# Patient Record
Sex: Female | Born: 1950 | Hispanic: No | Marital: Married | Smoking: Never smoker
Health system: Southern US, Community
[De-identification: ages and names within clinical notes are randomized; demographics above are authoritative.]

## PROBLEM LIST (undated history)

## (undated) DIAGNOSIS — N950 Postmenopausal bleeding: Secondary | ICD-10-CM

## (undated) DIAGNOSIS — H409 Unspecified glaucoma: Secondary | ICD-10-CM

## (undated) DIAGNOSIS — R252 Cramp and spasm: Secondary | ICD-10-CM

## (undated) DIAGNOSIS — E119 Type 2 diabetes mellitus without complications: Secondary | ICD-10-CM

## (undated) DIAGNOSIS — I10 Essential (primary) hypertension: Secondary | ICD-10-CM

## (undated) DIAGNOSIS — R002 Palpitations: Secondary | ICD-10-CM

## (undated) DIAGNOSIS — C55 Malignant neoplasm of uterus, part unspecified: Secondary | ICD-10-CM

## (undated) HISTORY — DX: Type 2 diabetes mellitus without complications: E11.9

## (undated) HISTORY — DX: Malignant neoplasm of uterus, part unspecified: C55

## (undated) HISTORY — DX: Essential (primary) hypertension: I10

## (undated) HISTORY — DX: Unspecified glaucoma: H40.9

---

## 2015-01-09 ENCOUNTER — Ambulatory Visit: Payer: 59 | Attending: Gynecologic Oncology | Admitting: Gynecologic Oncology

## 2015-01-09 ENCOUNTER — Ambulatory Visit: Payer: 59

## 2015-01-09 ENCOUNTER — Encounter: Payer: Self-pay | Admitting: Gynecologic Oncology

## 2015-01-09 VITALS — BP 167/73 | HR 70 | Temp 98.2°F | Resp 18 | Ht 61.0 in | Wt 125.4 lb

## 2015-01-09 DIAGNOSIS — E7800 Pure hypercholesterolemia, unspecified: Secondary | ICD-10-CM | POA: Insufficient documentation

## 2015-01-09 DIAGNOSIS — I10 Essential (primary) hypertension: Secondary | ICD-10-CM | POA: Diagnosis not present

## 2015-01-09 DIAGNOSIS — E119 Type 2 diabetes mellitus without complications: Secondary | ICD-10-CM | POA: Diagnosis not present

## 2015-01-09 DIAGNOSIS — C541 Malignant neoplasm of endometrium: Secondary | ICD-10-CM | POA: Diagnosis not present

## 2015-01-09 NOTE — Patient Instructions (Addendum)
Preparing for your Surgery  We will contact you with the results of your CT scan.  Plan for surgery on February 18 with Dr. [name].  Pre-operative Testing -You will receive a phone call from presurgical testing at [facility] to arrange for a pre-operative testing appointment before your surgery.  This appointment normally occurs one to two weeks before your scheduled surgery.   -Bring your insurance card, copy of an advanced directive if applicable, medication list  -At that visit, you will be asked to sign a consent for a possible blood transfusion in case a transfusion becomes necessary during surgery.  The need for a blood transfusion is rare but having consent is a necessary part of your care.     -You should not be taking blood thinners or aspirin at least ten days prior to surgery unless instructed by your surgeon.  Day Before Surgery at [facility] will be asked to take in only clear liquids the day before surgery.  Examples of clear liquids include broths, jello, and clear juices.  You will be advised to have nothing to eat or drink after midnight the evening before.    Your role in recovery Your role is to become active as soon as directed by your doctor, while still giving yourself time to heal.  Rest when you feel tired. You will be asked to do the following in order to speed your recovery:  - Cough and breathe deeply. This helps toclear and expand your lungs and can prevent pneumonia. You may be given a spirometer to practice deep breathing. A staff member will show you how to use the spirometer. - Do mild physical activity. Walking or moving your legs help your circulation and body functions return to normal. A staff member will help you when you try to walk and will provide you with simple exercises. Do not try to get up or walk alone the first time. - Actively manage your pain. Managing your pain lets you move in comfort. We will ask you to rate your pain  on a scale of zero to 10. It is your responsibility to tell your doctor or nurse where and how much you hurt so your pain can be treated.  Special Considerations -If you are diabetic, you may be placed on insulin after surgery to have closer control over your blood sugars to promote healing and recovery.  This does not mean that you will be discharged on insulin.  If applicable, your oral antidiabetics will be resumed when you are tolerating a solid diet.  -Your final pathology results from surgery should be available by the Friday after surgery and the results will be relayed to you when available.  Blood Transfusion Information WHAT IS A BLOOD TRANSFUSION? A transfusion is the replacement of blood or some of its parts. Blood is made up of multiple cells which provide different functions.  Red blood cells carry oxygen and are used for blood loss replacement.  White blood cells fight against infection.  Platelets control bleeding.  Plasma helps clot blood.  Other blood products are available for specialized needs, such as hemophilia or other clotting disorders. BEFORE THE TRANSFUSION  Who gives blood for transfusions?   You may be able to donate blood to be used at a later date on yourself (autologous donation).  Relatives can be asked to donate blood. This is generally not any safer than if you have received blood from a stranger. The same precautions are taken to ensure safety  when a relative's blood is donated.  Healthy volunteers who are fully evaluated to make sure their blood is safe. This is blood bank blood. Transfusion therapy is the safest it has ever been in the practice of medicine. Before blood is taken from a donor, a complete history is taken to make sure that person has no history of diseases nor engages in risky social behavior (examples are intravenous drug use or sexual activity with multiple partners). The donor's travel history is screened to minimize risk of  transmitting infections, such as malaria. The donated blood is tested for signs of infectious diseases, such as HIV and hepatitis. The blood is then tested to be sure it is compatible with you in order to minimize the chance of a transfusion reaction. If you or a relative donates blood, this is often done in anticipation of surgery and is not appropriate for emergency situations. It takes many days to process the donated blood. RISKS AND COMPLICATIONS Although transfusion therapy is very safe and saves many lives, the main dangers of transfusion include:   Getting an infectious disease.  Developing a transfusion reaction. This is an allergic reaction to something in the blood you were given. Every precaution is taken to prevent this. The decision to have a blood transfusion has been considered carefully by your caregiver before blood is given. Blood is not given unless the benefits outweigh the risks.

## 2015-01-09 NOTE — Progress Notes (Signed)
Consult Note: Gyn-Onc  Consult was requested by Dr. [name] for the evaluation of Brittany Schwartz 64 y.o. female  CC:  Chief Complaint  Patient presents with  . Endometrial cancer    Assessment/Plan:  [name]  is a 64 y.o.  year old with grade 3 endometrial cancer.   A detailed discussion was held with the patient and her family with regard to to her endometrial cancer diagnosis. We discussed the standard management options for uterine cancer which includes surgery followed possibly by adjuvant therapy depending on the results of surgery. The options for surgical management include a hysterectomy and removal of the tubes and ovaries possibly with removal of pelvic and para-aortic lymph nodes. A minimally invasive approach including a robotic hysterectomy or laparoscopic hysterectomy have benefits including shorter hospital stay, recovery time and better wound healing. The alternative approach is an open hysterectomy. The patient has been counseled about these surgical options and the risks of surgery in general including infection, bleeding, damage to surrounding structures (including bowel, bladder, ureters, nerves or vessels), and the postoperative risks of PE/ DVT, and lymphedema. I extensively reviewed the additional risks of robotic hysterectomy including possible need for conversion to open laparotomy.  I discussed positioning during surgery of trendelenberg and risks of minor facial swelling and care we take in preoperative positioning.  After counseling and consideration of her options, she desires to proceed with robotic total hysterectomy, BSO, pelvic and para-aortic lymphadenectomy on February 18th, 2016.   We will schedule a reoperative CT scan of the chest abdomen and pelvis given her grade 3 pathology and the increased risk for metastatic disease which might alter surgical plan.  She will be seen by anesthesia for preoperative clearance and discussion of postoperative pain  management.  She was given the opportunity to ask questions, which were answered to her satisfaction, and she is agreement with the above mentioned plan of care.   HPI: Brittany Schwartz is a 64 year old G2 P2 who is seen in consultation at the request of Dr. [name] for grade 3 endometrial adenocarcinoma. The patient reports a 1 year history of vaginal spotting. She saw Dr. [name] on 01/03/2015 and after reporting this history a pelvic ultrasound was performed. On ultrasound the uterus measured 8 x 5 x 4 cm with a 33 mm endometrial stripe and increased vascularity. The ovaries appeared normal and no fluid was noted in the cul-de-sac. An endometrial biopsy was performed on every second 2016 and this revealed grade 3 endometrial adenocarcinoma.  She is no family history for cancers. Her major risk factors for endometrial cancer include history of diabetes mellitus and hypertension. She's been worked up for a fast heart beat approximate 12 months ago and was placed on a beta blocker but workup was otherwise unremarkable. She has no prior surgical history.  Interval History: Her bleeding increased slightly after the endometrial biopsy however remains light.  Current Meds:  Outpatient Encounter Prescriptions as of 01/09/2015  Medication Sig  . atenolol-chlorthalidone (TENORETIC) 50-25 MG per tablet Take 1 tablet by mouth daily.  . cholecalciferol (VITAMIN D) 1000 UNITS tablet Take 1,000 Units by mouth daily.  Marland Kitchen glipiZIDE (GLUCOTROL XL) 5 MG 24 hr tablet Take 5 mg by mouth 2 (two) times daily.  Marland Kitchen latanoprost (XALATAN) 0.005 % ophthalmic solution Place 1 drop into both eyes at bedtime.  Marland Kitchen lisinopril (PRINIVIL,ZESTRIL) 5 MG tablet Take 5 mg by mouth daily.  . metFORMIN (GLUCOPHAGE) 1000 MG tablet Take 1,000 mg by mouth 2 (two) times daily  with a meal.  . OVER THE COUNTER MEDICATION 1 tablet.  . pravastatin (PRAVACHOL) 20 MG tablet Take 20 mg by mouth daily.    Allergy: Not on File  Social Hx:   History   Social  History  . Marital Status: Married    Spouse Name: N/A    Number of Children: N/A  . Years of Education: N/A   Occupational History  . Not on file.   Social History Main Topics  . Smoking status: Never Smoker   . Smokeless tobacco: Not on file  . Alcohol Use: No  . Drug Use: No  . Sexual Activity: Yes   Other Topics Concern  . Not on file   Social History Narrative  . No narrative on file    Past Surgical Hx: History reviewed. No pertinent past surgical history.  Past Medical Hx:  Past Medical History  Diagnosis Date  . Diabetes mellitus without complication   . Hypertension   . Uterine cancer   . Glaucoma     Past Gynecological History:  SVD x 2, postmenopausal  No LMP recorded.  Family Hx: History reviewed. No pertinent family history.  Review of Systems:  Constitutional  Feels well,    ENT Normal appearing ears and nares bilaterally Skin/Breast  No rash, sores, jaundice, itching, dryness Cardiovascular  No chest pain, shortness of breath, or edema  Pulmonary  No cough or wheeze.  Gastro Intestinal  No nausea, vomitting, or diarrhoea. No bright red blood per rectum, no abdominal pain, change in bowel movement, or constipation.  Genito Urinary  No frequency, urgency, dysuria, + postmenopausal bleeding Musculo Skeletal  No myalgia, arthralgia, joint swelling or pain  Neurologic  No weakness, numbness, change in gait,  Psychology  No depression, anxiety, insomnia.   Vitals:  Blood pressure 167/73, pulse 70, temperature 98.2 F (36.8 C), temperature source Oral, resp. rate 18.  Physical Exam: WD in NAD Neck  Supple NROM, without any enlargements.  Lymph Node Survey No cervical supraclavicular or inguinal adenopathy Cardiovascular  Pulse normal rate, regularity and rhythm. S1 and S2 normal.  Lungs  Clear to auscultation bilateraly, without wheezes/crackles/rhonchi. Good air movement.  Skin  No rash/lesions/breakdown  Psychiatry  Alert and  oriented to person, place, and time  Abdomen  Normoactive bowel sounds, abdomen soft, non-tender and thin without evidence of hernia. No palpable masses Back No CVA tenderness Genito Urinary  Vulva/vagina: Normal external female genitalia.   No lesions. No discharge or bleeding.  There is an excoriation on the left labia minora which does not appear consistent with dysplasia or malignancy.  Bladder/urethra:  No lesions or masses, well supported bladder  Vagina: normal in appearance, no metastases apparent  Cervix: Normal appearing, no lesions.  Uterus: Small, mobile, no parametrial involvement or nodularity.  Adnexa: no palpable masses. Rectal  Good tone, no masses no cul de sac nodularity.  Extremities  No bilateral cyanosis, clubbing or edema.   Donaciano Eva, MD   01/09/2015, 10:20 AM

## 2015-01-11 ENCOUNTER — Other Ambulatory Visit (HOSPITAL_COMMUNITY): Payer: Self-pay | Admitting: *Deleted

## 2015-01-13 ENCOUNTER — Telehealth: Payer: Self-pay | Admitting: Gynecologic Oncology

## 2015-01-13 ENCOUNTER — Ambulatory Visit (HOSPITAL_COMMUNITY)
Admission: RE | Admit: 2015-01-13 | Discharge: 2015-01-13 | Disposition: A | Discharge status: Home / Self Care | Payer: 59 | Source: Ambulatory Visit | Attending: Gynecologic Oncology | Admitting: Gynecologic Oncology

## 2015-01-13 ENCOUNTER — Encounter (HOSPITAL_COMMUNITY): Payer: Self-pay

## 2015-01-13 ENCOUNTER — Encounter (HOSPITAL_COMMUNITY)
Admission: RE | Admit: 2015-01-13 | Discharge: 2015-01-13 | Disposition: A | Discharge status: Home / Self Care | Payer: 59 | Source: Ambulatory Visit | Attending: Gynecologic Oncology | Admitting: Gynecologic Oncology

## 2015-01-13 DIAGNOSIS — C541 Malignant neoplasm of endometrium: Secondary | ICD-10-CM | POA: Diagnosis not present

## 2015-01-13 DIAGNOSIS — N939 Abnormal uterine and vaginal bleeding, unspecified: Secondary | ICD-10-CM | POA: Insufficient documentation

## 2015-01-13 HISTORY — DX: Palpitations: R00.2

## 2015-01-13 HISTORY — DX: Cramp and spasm: R25.2

## 2015-01-13 HISTORY — DX: Postmenopausal bleeding: N95.0

## 2015-01-13 LAB — URINALYSIS, ROUTINE W REFLEX MICROSCOPIC
BILIRUBIN URINE: NEGATIVE
Glucose, UA: NEGATIVE mg/dL
Ketones, ur: NEGATIVE mg/dL
Leukocytes, UA: NEGATIVE
Nitrite: NEGATIVE
PROTEIN: NEGATIVE mg/dL
Specific Gravity, Urine: >1.046 — ABNORMAL HIGH (ref 1.005–1.030)
UROBILINOGEN UA: 0.2 mg/dL (ref 0.0–1.0)
pH: 6 (ref 5.0–8.0)

## 2015-01-13 LAB — URINE MICROSCOPIC-ADD ON

## 2015-01-13 MED ORDER — IOHEXOL 300 MG/ML  SOLN
100.0000 mL | Freq: Once | INTRAMUSCULAR | Status: AC | PRN
Start: 2015-01-13 — End: 2015-01-13
  Administered 2015-01-13: 100 mL via INTRAVENOUS

## 2015-01-13 NOTE — Telephone Encounter (Signed)
Patient's daughter informed of CT scan results.  Advised to call for any questions or concerns.

## 2015-01-13 NOTE — Patient Instructions (Addendum)
Brittany Schwartz  01/13/2015   Your procedure is scheduled on: 01/19/15   Report to [facility] Main  Entrance and follow signs to               [place] at 10:30 AM.   Call this number if you have problems the morning of surgery [phone]   Remember:  Do not eat food or drink liquids :After Midnight.     Take these medicines the morning of surgery with A SIP OF WATER: NONE              CLEAR LIQUIDS ONLY THE DAY BEFORE SURGERY     CLEAR LIQUID DIET   Foods Allowed                                                                     Foods Excluded  Coffee and tea, regular and decaf                             liquids that you cannot  Plain Jell-O in any flavor                                             see through such as: Fruit ices (not with fruit pulp)                                     milk, soups, orange juice  Iced Popsicles                                                 All solid food Carbonated beverages, regular and diet                                    Cranberry, grape and apple juices Sports drinks like Gatorade Lightly seasoned clear broth or consume(fat free) Sugar, honey syrup  _____________________________________________________________________                                 Brittany Schwartz may not have any metal on your body including hair pins and              piercings  Do not wear jewelry, make-up, lotions, powders or perfumes.             Do not wear nail polish.  Do not shave  48 hours prior to surgery.              Men may shave face and neck.   Do not bring valuables to the hospital. Brittany Schwartz  FOR VALUABLES.  Contacts, dentures or bridgework may not be worn into surgery.  Leave suitcase in the car. After surgery it may be brought to your room.     Patients discharged the day of surgery will not be allowed to drive home.  Name and phone number of your driver:  Special Instructions:  N/A              Please read over the following fact sheets you were given: _____________________________________________________________________                                                     [place]  Before surgery, you can play an important role.  Because skin is not sterile, your skin needs to be as free of germs as possible.  You can reduce the number of germs on your skin by washing with CHG (chlorahexidine gluconate) soap before surgery.  CHG is an antiseptic cleaner which kills germs and bonds with the skin to continue killing germs even after washing. Please DO NOT use if you have an allergy to CHG or antibacterial soaps.  If your skin becomes reddened/irritated stop using the CHG and inform your nurse when you arrive at Short Stay. Do not shave (including legs and underarms) for at least 48 hours prior to the first CHG shower.  You may shave your face. Please follow these instructions carefully:   1.  Shower with CHG Soap the night before surgery and the  morning of Surgery.   2.  If you choose to wash your hair, wash your hair first as usual with your  normal  Shampoo.   3.  After you shampoo, rinse your hair and body thoroughly to remove the  shampoo.                                         4.  Use CHG as you would any other liquid soap.  You can apply chg directly  to the skin and wash . Gently wash with scrungie or clean wascloth    5.  Apply the CHG Soap to your body ONLY FROM THE NECK DOWN.   Do not use on open                           Wound or open sores. Avoid contact with eyes, ears mouth and genitals (private parts).                        Genitals (private parts) with your normal soap.              6.  Wash thoroughly, paying special attention to the area where your surgery  will be performed.   7.  Thoroughly rinse your body with warm water from the neck down.   8.  DO NOT shower/wash with your normal soap after using and rinsing  off  the CHG Soap .                9.  Pat yourself dry with a clean towel.             10.  Wear  clean pajamas.             11.  Place clean sheets on your bed the night of your first shower and do not  sleep with pets.  Day of Surgery : Do not apply any lotions/deodorants the morning of surgery.  Please wear clean clothes to the hospital/surgery center.  FAILURE TO FOLLOW THESE INSTRUCTIONS MAY RESULT IN THE CANCELLATION OF YOUR SURGERY    PATIENT SIGNATURE_________________________________  ______________________________________________________________________     Brittany Schwartz  An incentive spirometer is a tool that can help keep your lungs clear and active. This tool measures how well you are filling your lungs with each breath. Taking long deep breaths may help reverse or decrease the chance of developing breathing (pulmonary) problems (especially infection) following:  A long period of time when you are unable to move or be active. BEFORE THE PROCEDURE   If the spirometer includes an indicator to show your best effort, your nurse or respiratory therapist will set it to a desired goal.  If possible, sit up straight or lean slightly forward. Try not to slouch.  Hold the incentive spirometer in an upright position. INSTRUCTIONS FOR USE   Sit on the edge of your bed if possible, or sit up as far as you can in bed or on a chair.  Hold the incentive spirometer in an upright position.  Breathe out normally.  Place the mouthpiece in your mouth and seal your lips tightly around it.  Breathe in slowly and as deeply as possible, raising the piston or the ball toward the top of the column.  Hold your breath for 3-5 seconds or for as long as possible. Allow the piston or ball to fall to the bottom of the column.  Remove the mouthpiece from your mouth and breathe out normally.  Rest for a few seconds and repeat Steps 1 through 7 at least 10 times every 1-2 hours when  you are awake. Take your time and take a few normal breaths between deep breaths.  The spirometer may include an indicator to show your best effort. Use the indicator as a goal to work toward during each repetition.  After each set of 10 deep breaths, practice coughing to be sure your lungs are clear. If you have an incision (the cut made at the time of surgery), support your incision when coughing by placing a pillow or rolled up towels firmly against it. Once you are able to get out of bed, walk around indoors and cough well. You may stop using the incentive spirometer when instructed by your caregiver.  RISKS AND COMPLICATIONS  Take your time so you do not get dizzy or light-headed.  If you are in pain, you may need to take or ask for pain medication before doing incentive spirometry. It is harder to take a deep breath if you are having pain. AFTER USE  Rest and breathe slowly and easily.  It can be helpful to keep track of a log of your progress. Your caregiver can provide you with a simple table to help with this. If you are using the spirometer at home, follow these instructions: [place] IF:   You are having difficultly using the spirometer.  You have trouble using the spirometer as often as instructed.  Your pain medication is not giving enough relief while using the spirometer.  You develop fever of 100.5 F (38.1 C) or higher. SEEK IMMEDIATE MEDICAL CARE IF:   You cough up bloody  sputum that had not been present before.  You develop fever of 102 F (38.9 C) or greater.  You develop worsening pain at or near the incision site. MAKE SURE YOU:   Understand these instructions.  Will watch your condition.  Will get help right away if you are not doing well or get worse. Document Released: 03/31/2007 Document Revised: 02/10/2012 Document Reviewed: 06/01/2007 Exit[employer] Patient Information 2014 Exit[employer],  [place].   ________________________________________________________________________  WHAT IS A BLOOD TRANSFUSION? Blood Transfusion Information  A transfusion is the replacement of blood or some of its parts. Blood is made up of multiple cells which provide different functions.  Red blood cells carry oxygen and are used for blood loss replacement.  White blood cells fight against infection.  Platelets control bleeding.  Plasma helps clot blood.  Other blood products are available for specialized needs, such as hemophilia or other clotting disorders. BEFORE THE TRANSFUSION  Who gives blood for transfusions?   Healthy volunteers who are fully evaluated to make sure their blood is safe. This is blood bank blood. Transfusion therapy is the safest it has ever been in the practice of medicine. Before blood is taken from a donor, a complete history is taken to make sure that person has no history of diseases nor engages in risky social behavior (examples are intravenous drug use or sexual activity with multiple partners). The donor's travel history is screened to minimize risk of transmitting infections, such as malaria. The donated blood is tested for signs of infectious diseases, such as HIV and hepatitis. The blood is then tested to be sure it is compatible with you in order to minimize the chance of a transfusion reaction. If you or a relative donates blood, this is often done in anticipation of surgery and is not appropriate for emergency situations. It takes many days to process the donated blood. RISKS AND COMPLICATIONS Although transfusion therapy is very safe and saves many lives, the main dangers of transfusion include:   Getting an infectious disease.  Developing a transfusion reaction. This is an allergic reaction to something in the blood you were given. Every precaution is taken to prevent this. The decision to have a blood transfusion has been considered carefully by your caregiver  before blood is given. Blood is not given unless the benefits outweigh the risks. AFTER THE TRANSFUSION  Right after receiving a blood transfusion, you will usually feel much better and more energetic. This is especially true if your red blood cells have gotten low (anemic). The transfusion raises the level of the red blood cells which carry oxygen, and this usually causes an energy increase.  The nurse administering the transfusion will monitor you carefully for complications. HOME CARE INSTRUCTIONS  No special instructions are needed after a transfusion. You may find your energy is better. Speak with your caregiver about any limitations on activity for underlying diseases you may have. SEEK MEDICAL CARE IF:   Your condition is not improving after your transfusion.  You develop redness or irritation at the intravenous (IV) site. SEEK IMMEDIATE MEDICAL CARE IF:  Any of the following symptoms occur over the next 12 hours:  Shaking chills.  You have a temperature by mouth above 102 F (38.9 C), not controlled by medicine.  Chest, back, or muscle pain.  People around you feel you are not acting correctly or are confused.  Shortness of breath or difficulty breathing.  Dizziness and fainting.  You get a rash or develop hives.  You have a  decrease in urine output.  Your urine turns a dark color or changes to pink, red, or brown. Any of the following symptoms occur over the next 10 days:  You have a temperature by mouth above 102 F (38.9 C), not controlled by medicine.  Shortness of breath.  Weakness after normal activity.  The white part of the eye turns yellow (jaundice).  You have a decrease in the amount of urine or are urinating less often.  Your urine turns a dark color or changes to pink, red, or brown. Document Released: 11/15/2000 Document Revised: 02/10/2012 Document Reviewed: 07/04/2008 [facility] Patient Information 2014 [place], [place],  [place].  _______________________________________________________________________

## 2015-01-13 NOTE — Telephone Encounter (Signed)
-----   Message from Everitt Amber, MD sent at 01/13/2015 11:07 AM EST ----- Would you mind letting Ms Davern know that her CT scan did not show any large metastatic disease and our plan for surgery is unchanged. Brittany Schwartz

## 2015-01-14 LAB — CA 125: CA 125: 22.9 U/mL (ref 0.0–34.0)

## 2015-01-16 NOTE — Progress Notes (Signed)
Ca125 faxed via EPIC to Dr [name].

## 2015-01-19 ENCOUNTER — Ambulatory Visit (HOSPITAL_COMMUNITY)
Admission: RE | Admit: 2015-01-19 | Discharge: 2015-01-20 | Disposition: A | Discharge status: Home / Self Care | Payer: 59 | Source: Ambulatory Visit | Attending: Obstetrics & Gynecology | Admitting: Obstetrics & Gynecology

## 2015-01-19 ENCOUNTER — Ambulatory Visit (HOSPITAL_COMMUNITY): Payer: 59 | Admitting: Certified Registered Nurse Anesthetist

## 2015-01-19 ENCOUNTER — Encounter (HOSPITAL_COMMUNITY): Payer: Self-pay | Admitting: *Deleted

## 2015-01-19 ENCOUNTER — Encounter (HOSPITAL_COMMUNITY)
Admission: RE | Disposition: A | Discharge status: Home / Self Care | Payer: Self-pay | Source: Ambulatory Visit | Attending: Obstetrics & Gynecology

## 2015-01-19 DIAGNOSIS — C785 Secondary malignant neoplasm of large intestine and rectum: Secondary | ICD-10-CM | POA: Insufficient documentation

## 2015-01-19 DIAGNOSIS — Z79899 Other long term (current) drug therapy: Secondary | ICD-10-CM | POA: Insufficient documentation

## 2015-01-19 DIAGNOSIS — C541 Malignant neoplasm of endometrium: Secondary | ICD-10-CM | POA: Diagnosis present

## 2015-01-19 DIAGNOSIS — I10 Essential (primary) hypertension: Secondary | ICD-10-CM | POA: Insufficient documentation

## 2015-01-19 DIAGNOSIS — E119 Type 2 diabetes mellitus without complications: Secondary | ICD-10-CM | POA: Diagnosis not present

## 2015-01-19 HISTORY — PX: ROBOTIC ASSISTED TOTAL HYSTERECTOMY WITH BILATERAL SALPINGO OOPHERECTOMY: SHX6086

## 2015-01-19 LAB — TYPE AND SCREEN
ABO/RH(D): B POS
Antibody Screen: NEGATIVE

## 2015-01-19 LAB — GLUCOSE, CAPILLARY
GLUCOSE-CAPILLARY: 130 mg/dL — AB (ref 70–99)
GLUCOSE-CAPILLARY: 203 mg/dL — AB (ref 70–99)
Glucose-Capillary: 154 mg/dL — ABNORMAL HIGH (ref 70–99)

## 2015-01-19 LAB — ABO/RH: ABO/RH(D): B POS

## 2015-01-19 SURGERY — HYSTERECTOMY, TOTAL, ROBOT-ASSISTED, LAPAROSCOPIC, WITH BILATERAL SALPINGO-OOPHORECTOMY
Anesthesia: General | Laterality: Bilateral

## 2015-01-19 MED ORDER — TIMOLOL HEMIHYDRATE 0.5 % OP SOLN: 1.0000 [drp] | Freq: Two times a day (BID) | OPHTHALMIC | Status: DC

## 2015-01-19 MED ORDER — CEFAZOLIN SODIUM-DEXTROSE 2-3 GM-% IV SOLR
INTRAVENOUS | Status: AC
  Filled 2015-01-19: qty 50

## 2015-01-19 MED ORDER — ONDANSETRON HCL 4 MG/2ML IJ SOLN
INTRAMUSCULAR | Status: AC
  Filled 2015-01-19: qty 2

## 2015-01-19 MED ORDER — IBUPROFEN 800 MG PO TABS
800.0000 mg | ORAL_TABLET | Freq: Three times a day (TID) | ORAL | Status: DC | PRN
  Administered 2015-01-20: 800 mg via ORAL
  Filled 2015-01-19: qty 1

## 2015-01-19 MED ORDER — LISINOPRIL 10 MG PO TABS
10.0000 mg | ORAL_TABLET | Freq: Every day | ORAL | Status: DC
  Administered 2015-01-19: 10 mg via ORAL
  Filled 2015-01-19 (×2): qty 1

## 2015-01-19 MED ORDER — SUCCINYLCHOLINE CHLORIDE 20 MG/ML IJ SOLN
INTRAMUSCULAR | Status: DC | PRN
  Administered 2015-01-19: 100 mg via INTRAVENOUS

## 2015-01-19 MED ORDER — PHENYLEPHRINE HCL 10 MG/ML IJ SOLN
INTRAMUSCULAR | Status: DC | PRN
  Administered 2015-01-19 (×3): 40 ug via INTRAVENOUS
  Administered 2015-01-19: 80 ug via INTRAVENOUS

## 2015-01-19 MED ORDER — HYDROMORPHONE HCL 1 MG/ML IJ SOLN
INTRAMUSCULAR | Status: AC
  Filled 2015-01-19: qty 1

## 2015-01-19 MED ORDER — ONDANSETRON HCL 4 MG PO TABS: 4.0000 mg | ORAL_TABLET | Freq: Four times a day (QID) | ORAL | Status: DC | PRN

## 2015-01-19 MED ORDER — LATANOPROST 0.005 % OP SOLN
1.0000 [drp] | Freq: Every day | OPHTHALMIC | Status: DC
  Administered 2015-01-19: 1 [drp] via OPHTHALMIC
  Filled 2015-01-19: qty 2.5

## 2015-01-19 MED ORDER — LIDOCAINE HCL (CARDIAC) 20 MG/ML IV SOLN
INTRAVENOUS | Status: AC
  Filled 2015-01-19: qty 5

## 2015-01-19 MED ORDER — ONDANSETRON HCL 4 MG/2ML IJ SOLN: 4.0000 mg | Freq: Four times a day (QID) | INTRAMUSCULAR | Status: DC | PRN

## 2015-01-19 MED ORDER — OXYCODONE-ACETAMINOPHEN 5-325 MG PO TABS
1.0000 | ORAL_TABLET | ORAL | Status: DC | PRN
  Administered 2015-01-20: 2 via ORAL
  Filled 2015-01-19: qty 2

## 2015-01-19 MED ORDER — HYDROMORPHONE HCL 1 MG/ML IJ SOLN
0.2000 mg | INTRAMUSCULAR | Status: AC | PRN
  Administered 2015-01-19 – 2015-01-20 (×2): 0.5 mg via INTRAVENOUS
  Filled 2015-01-19 (×2): qty 1

## 2015-01-19 MED ORDER — ATENOLOL-CHLORTHALIDONE 50-25 MG PO TABS: 1.0000 | ORAL_TABLET | Freq: Every morning | ORAL | Status: DC

## 2015-01-19 MED ORDER — ROCURONIUM BROMIDE 100 MG/10ML IV SOLN
INTRAVENOUS | Status: DC | PRN
  Administered 2015-01-19: 20 mg via INTRAVENOUS
  Administered 2015-01-19: 25 mg via INTRAVENOUS
  Administered 2015-01-19: 5 mg via INTRAVENOUS

## 2015-01-19 MED ORDER — ATENOLOL 50 MG PO TABS
50.0000 mg | ORAL_TABLET | Freq: Once | ORAL | Status: AC
  Administered 2015-01-19: 50 mg via ORAL
  Filled 2015-01-19: qty 1

## 2015-01-19 MED ORDER — ATENOLOL 50 MG PO TABS
50.0000 mg | ORAL_TABLET | Freq: Every day | ORAL | Status: DC
  Administered 2015-01-19 – 2015-01-20 (×2): 50 mg via ORAL
  Filled 2015-01-19 (×2): qty 1

## 2015-01-19 MED ORDER — DEXAMETHASONE SODIUM PHOSPHATE 10 MG/ML IJ SOLN
INTRAMUSCULAR | Status: AC
  Filled 2015-01-19: qty 1

## 2015-01-19 MED ORDER — LACTATED RINGERS IR SOLN
Status: DC | PRN
  Administered 2015-01-19: 700 mL

## 2015-01-19 MED ORDER — FENTANYL CITRATE 0.05 MG/ML IJ SOLN
INTRAMUSCULAR | Status: AC
  Filled 2015-01-19: qty 5

## 2015-01-19 MED ORDER — KCL IN DEXTROSE-NACL 20-5-0.45 MEQ/L-%-% IV SOLN
INTRAVENOUS | Status: DC
  Administered 2015-01-19: 22:00:00 via INTRAVENOUS
  Filled 2015-01-19 (×4): qty 1000

## 2015-01-19 MED ORDER — ENOXAPARIN SODIUM 40 MG/0.4ML ~~LOC~~ SOLN
40.0000 mg | SUBCUTANEOUS | Status: AC
  Administered 2015-01-19: 40 mg via SUBCUTANEOUS
  Filled 2015-01-19: qty 0.4

## 2015-01-19 MED ORDER — CHLORTHALIDONE 25 MG PO TABS
25.0000 mg | ORAL_TABLET | Freq: Every day | ORAL | Status: DC
Start: 2015-01-19 — End: 2015-01-20
  Administered 2015-01-19 – 2015-01-20 (×2): 25 mg via ORAL
  Filled 2015-01-19 (×2): qty 1

## 2015-01-19 MED ORDER — FENTANYL CITRATE 0.05 MG/ML IJ SOLN
INTRAMUSCULAR | Status: DC | PRN
  Administered 2015-01-19 (×3): 50 ug via INTRAVENOUS
  Administered 2015-01-19: 100 ug via INTRAVENOUS

## 2015-01-19 MED ORDER — LACTATED RINGERS IV SOLN
INTRAVENOUS | Status: DC
  Administered 2015-01-19: 1000 mL via INTRAVENOUS
  Administered 2015-01-19: 18:00:00 via INTRAVENOUS

## 2015-01-19 MED ORDER — LIDOCAINE HCL (CARDIAC) 20 MG/ML IV SOLN
INTRAVENOUS | Status: DC | PRN
  Administered 2015-01-19: 50 mg via INTRAVENOUS

## 2015-01-19 MED ORDER — DEXAMETHASONE SODIUM PHOSPHATE 10 MG/ML IJ SOLN
INTRAMUSCULAR | Status: DC | PRN
  Administered 2015-01-19: 10 mg via INTRAVENOUS

## 2015-01-19 MED ORDER — ONDANSETRON HCL 4 MG/2ML IJ SOLN
INTRAMUSCULAR | Status: DC | PRN
  Administered 2015-01-19: 4 mg via INTRAVENOUS

## 2015-01-19 MED ORDER — LACTATED RINGERS IV SOLN
INTRAVENOUS | Status: DC
  Administered 2015-01-19: 20:00:00 via INTRAVENOUS

## 2015-01-19 MED ORDER — MIDAZOLAM HCL 5 MG/5ML IJ SOLN
INTRAMUSCULAR | Status: DC | PRN
  Administered 2015-01-19 (×2): 1 mg via INTRAVENOUS

## 2015-01-19 MED ORDER — NEOSTIGMINE METHYLSULFATE 10 MG/10ML IV SOLN
INTRAVENOUS | Status: DC | PRN
  Administered 2015-01-19: 4 mg via INTRAVENOUS

## 2015-01-19 MED ORDER — HYDROMORPHONE HCL 1 MG/ML IJ SOLN
0.2500 mg | INTRAMUSCULAR | Status: DC | PRN
  Administered 2015-01-19 (×2): 0.5 mg via INTRAVENOUS

## 2015-01-19 MED ORDER — ENOXAPARIN SODIUM 40 MG/0.4ML ~~LOC~~ SOLN
40.0000 mg | SUBCUTANEOUS | Status: DC
  Administered 2015-01-20: 40 mg via SUBCUTANEOUS
  Filled 2015-01-19: qty 0.4

## 2015-01-19 MED ORDER — INSULIN ASPART 100 UNIT/ML ~~LOC~~ SOLN
0.0000 [IU] | Freq: Three times a day (TID) | SUBCUTANEOUS | Status: DC
  Administered 2015-01-20: 3 [IU] via SUBCUTANEOUS
  Administered 2015-01-20: 5 [IU] via SUBCUTANEOUS

## 2015-01-19 MED ORDER — GLYCOPYRROLATE 0.2 MG/ML IJ SOLN
INTRAMUSCULAR | Status: DC | PRN
  Administered 2015-01-19: 0.6 mg via INTRAVENOUS

## 2015-01-19 MED ORDER — MIDAZOLAM HCL 2 MG/2ML IJ SOLN
INTRAMUSCULAR | Status: AC
  Filled 2015-01-19: qty 2

## 2015-01-19 MED ORDER — CEFAZOLIN SODIUM-DEXTROSE 2-3 GM-% IV SOLR
2.0000 g | INTRAVENOUS | Status: AC
  Administered 2015-01-19: 2 g via INTRAVENOUS

## 2015-01-19 MED ORDER — PRAVASTATIN SODIUM 20 MG PO TABS
20.0000 mg | ORAL_TABLET | Freq: Every day | ORAL | Status: DC
  Administered 2015-01-20: 20 mg via ORAL
  Filled 2015-01-19: qty 1

## 2015-01-19 MED ORDER — PROPOFOL 10 MG/ML IV BOLUS
INTRAVENOUS | Status: DC | PRN
  Administered 2015-01-19: 160 mg via INTRAVENOUS

## 2015-01-19 MED ORDER — TIMOLOL MALEATE 0.5 % OP SOLN
1.0000 [drp] | Freq: Two times a day (BID) | OPHTHALMIC | Status: DC
  Administered 2015-01-19 – 2015-01-20 (×2): 1 [drp] via OPHTHALMIC
  Filled 2015-01-19: qty 5

## 2015-01-19 MED ORDER — PROPOFOL 10 MG/ML IV BOLUS
INTRAVENOUS | Status: AC
  Filled 2015-01-19: qty 20

## 2015-01-19 SURGICAL SUPPLY — 55 items
BENZOIN TINCTURE PRP APPL 2/3 (GAUZE/BANDAGES/DRESSINGS) IMPLANT
CHLORAPREP W/TINT 26ML (MISCELLANEOUS) ×3 IMPLANT
CLOSURE WOUND 1/2 X4 (GAUZE/BANDAGES/DRESSINGS)
CORD HIGH FREQUENCY UNIPOLAR (ELECTROSURGICAL) ×3 IMPLANT
CORDS BIPOLAR (ELECTRODE) ×3 IMPLANT
COVER SURGICAL LIGHT HANDLE (MISCELLANEOUS) IMPLANT
COVER TIP SHEARS 8 DVNC (MISCELLANEOUS) ×1 IMPLANT
COVER TIP SHEARS 8MM DA VINCI (MISCELLANEOUS) ×2
DECANTER SPIKE VIAL GLASS SM (MISCELLANEOUS) IMPLANT
DRAPE SHEET LG 3/4 BI-LAMINATE (DRAPES) ×6 IMPLANT
DRAPE SURG IRRIG POUCH 19X23 (DRAPES) ×3 IMPLANT
DRAPE TABLE BACK 44X90 PK DISP (DRAPES) ×6 IMPLANT
DRAPE UTILITY XL STRL (DRAPES) ×3 IMPLANT
DRAPE WARM FLUID 44X44 (DRAPE) ×3 IMPLANT
DRSG TEGADERM 2-3/8X2-3/4 SM (GAUZE/BANDAGES/DRESSINGS) ×3 IMPLANT
DRSG TEGADERM 4X4.75 (GAUZE/BANDAGES/DRESSINGS) IMPLANT
DRSG TEGADERM 6X8 (GAUZE/BANDAGES/DRESSINGS) ×9 IMPLANT
ELECT REM PT RETURN 9FT ADLT (ELECTROSURGICAL) ×3
ELECTRODE REM PT RTRN 9FT ADLT (ELECTROSURGICAL) ×1 IMPLANT
GAUZE SPONGE 2X2 8PLY STRL LF (GAUZE/BANDAGES/DRESSINGS) IMPLANT
GLOVE BIO SURGEON STRL SZ 6.5 (GLOVE) ×8 IMPLANT
GLOVE BIO SURGEON STRL SZ7.5 (GLOVE) IMPLANT
GLOVE BIO SURGEONS STRL SZ 6.5 (GLOVE) ×4
GLOVE BIOGEL PI IND STRL 7.0 (GLOVE) ×2 IMPLANT
GLOVE BIOGEL PI INDICATOR 7.0 (GLOVE) ×4
GOWN STRL REUS W/ TWL XL LVL3 (GOWN DISPOSABLE) ×3 IMPLANT
GOWN STRL REUS W/TWL XL LVL3 (GOWN DISPOSABLE) ×6
HOLDER FOLEY CATH W/STRAP (MISCELLANEOUS) ×3 IMPLANT
KIT ACCESSORY DA VINCI DISP (KITS)
KIT ACCESSORY DVNC DISP (KITS) IMPLANT
MANIPULATOR UTERINE 4.5 ZUMI (MISCELLANEOUS) ×3 IMPLANT
OCCLUDER COLPOPNEUMO (BALLOONS) ×6 IMPLANT
POUCH SPECIMEN RETRIEVAL 10MM (ENDOMECHANICALS) ×3 IMPLANT
SCISSORS LAP 5X35 DISP (ENDOMECHANICALS) ×3 IMPLANT
SET TUBE IRRIG SUCTION NO TIP (IRRIGATION / IRRIGATOR) IMPLANT
SHEET LAVH (DRAPES) ×3 IMPLANT
SOLUTION ELECTROLUBE (MISCELLANEOUS) ×3 IMPLANT
SPONGE GAUZE 2X2 STER 10/PKG (GAUZE/BANDAGES/DRESSINGS)
SPONGE LAP 18X18 X RAY DECT (DISPOSABLE) IMPLANT
STRIP CLOSURE SKIN 1/2X4 (GAUZE/BANDAGES/DRESSINGS) IMPLANT
SUT VIC AB 0 CT1 27 (SUTURE)
SUT VIC AB 0 CT1 27XBRD ANTBC (SUTURE) IMPLANT
SUT VIC AB 4-0 PS2 27 (SUTURE) ×6 IMPLANT
SUT VICRYL 0 UR6 27IN ABS (SUTURE) IMPLANT
SUT VLOC 180 0 6IN GS21 (SUTURE) ×3 IMPLANT
SYR BULB IRRIGATION 50ML (SYRINGE) IMPLANT
TOWEL OR 17X26 10 PK STRL BLUE (TOWEL DISPOSABLE) ×6 IMPLANT
TRAP SPECIMEN MUCOUS 40CC (MISCELLANEOUS) IMPLANT
TRAY FOLEY CATH 14FRSI W/METER (CATHETERS) ×3 IMPLANT
TRAY LAPAROSCOPIC (CUSTOM PROCEDURE TRAY) ×3 IMPLANT
TROCAR BLADELESS OPT 5 100 (ENDOMECHANICALS) ×3 IMPLANT
TROCAR XCEL 12X100 BLDLESS (ENDOMECHANICALS) ×3 IMPLANT
TROCAR XCEL BLUNT TIP 100MML (ENDOMECHANICALS) ×3 IMPLANT
TUBING INSUFFLATION 10FT LAP (TUBING) ×3 IMPLANT
WATER STERILE IRR 1500ML POUR (IV SOLUTION) IMPLANT

## 2015-01-19 NOTE — Op Note (Signed)
Preoperative Diagnosis:  Grade 3 endometrial cancer  Postoperative Diagnosis: Stage IVB Grade 3 endometrial cancer  Procedure(s) Performed: Robotic total laparoscopic hysterectomy, Bilateral salpingo oophorectomy, resection of metastatic disease to the sigmoid serosa.    Anesthesia: GET  Surgeon: Francetta Found.  [name], M.D. PhD  Assistant Surgeon: Lahoma Crocker MD.   Specimens: Uterus cervix, bilateral ovaries tubes, sigmoid colon nodule  Estimated Blood Loss: < 50 cc  Complications: none  Indication for Procedure:Ms Shyniece Scripter is a 64 y.o. with grade 3.  endometrial cancer.  Operative Findings:  8cm uterus bilateral normal adnexa. 2.5cm metastatic lesion on the sigmoid serosa.  Multiple 34mm-1cm implants on the bladder peritoneum.  Small miliary disease on the bilateral pelvic sidewalls.  Upper abdomen and diaphragm without evidence of disease.    Procedure: Patient was taken to the operating room and placed under general endotracheal anesthesia without any difficulty. She is placed in the dorsal lithotomy position and shoulder blocks were used to secure her position. Leasia Swann  was prepped and draped and the uterine manipulator placed within the endometrial cavity. The balloon was placed within the vagina. An OG tube was present and functional.   The abdominal cavity was entered 4 cm superior to the umbilicus using  a 5 mm Optiview inserted under direct visualization. The abdomen was insufflated to 15 mm of mercury and the pressure never deviated above that throughout the remainder of the procedure. Maximum Trendelenburg positioning was obtained. 71mm ports were placed 10 cm lateral to the umbilical port and 8 cm lateral  and inferior t on the right.  A left upper quadrant 12 mm assistant port was inserted under direct visualization.   The small and large bowel were reflected as much as possible into the upper abdomen to facilitate visualization of the periaortic LN.   The robot  was docked and instruments placed.  The 2.5cm lesion on the peritoneum of the sigmoid was sharply resected and placed in an endo bag that was delivered through the assistant port.    The right round ligament was transected and the ureter was identified. The right infundibulopelvic ligament was cauterized and transected.  The retroperitoneal space was entered on the right and the peritoneum incised to the level of the vesicouterine ligament anterior ly encompassing the metastatic disease on the bladder peritoneum. The bladder flap was created using Bovie cautery. The peritoneal dissection was continued inferiorly and across the inferior most aspect of the cervix. In this manner the urethra was deflected inferiorly. The bladder flap was further developed. The uterine vessels on the right were skeletonized ligated and transected.  The left ureter was identified. The left gonadal vessels were cauterized and transected.  The broad ligament was skeletonized posteriorly to the level of the cervix and the peritoneum dissected free from the cervix and in this fashion the ureter was deflected inferiorly. The anterior peritoneum was further dissected and the bladder flap appropriately developed. The uterine vessels were skeletonized cauterized and transected. The balloon and the vagina was then maximally insufflated.   A colpotomy incision was made circumferentially and the uterus cervix ovaries tubes and lymph nodes were delivered from the vagina. The balloon was replaced.  The pelvis was copiously irrigated and drained and hemostasis was assured. The vaginal cuff was closed with a running 2.0 vlock  suture ligature. The needle was removed under direct visualization. The operative site is once again visualized and hemostasis was assured. The instruments were removed from the abdomen and pelvis and the port  sites irrigated.  The subcutaneous tissue of the left upper quadrant port was approximated with a single  suture. Skin incisions were closed with a subcuticular suture. Demabond was placed over the incisions.  The vaginal vault was cleared with a moist sponge stick.  Sponge, lap and needle counts were correct x 3.    The patient had sequential compression devices and preoperative Lovenox for VTE prophylaxis and will receive Lovenox postoperatively.          Disposition: PACU - hemodynamically stable.         Condition:stable Foley draining clear urine.

## 2015-01-19 NOTE — Transfer of Care (Signed)
Immediate Anesthesia Transfer of Care Note  Patient: Brittany Schwartz  Procedure(s) Performed: Procedure(s): XI ROBOTIC ASSISTED TOTAL HYSTERECTOMY WITH BILATERAL SALPINGO OOPHORECTOMY (Bilateral)  Patient Location: PACU  Anesthesia Type:General  Level of Consciousness: awake, alert  and oriented  Airway & Oxygen Therapy: Patient Spontanous Breathing and Patient connected to face mask oxygen  Post-op Assessment: Report given to RN and Post -op Vital signs reviewed and stable  Post vital signs: Reviewed and stable  Last Vitals:  Filed Vitals:   01/19/15 1023  BP: 157/73  Pulse: 71  Temp: 36.6 C  Resp: 16    Complications: No apparent anesthesia complications

## 2015-01-19 NOTE — Anesthesia Postprocedure Evaluation (Signed)
  Anesthesia Post-op Note  Patient: Brittany Schwartz  Procedure(s) Performed: Procedure(s) (LRB): XI ROBOTIC ASSISTED TOTAL HYSTERECTOMY WITH BILATERAL SALPINGO OOPHORECTOMY (Bilateral)  Patient Location: PACU  Anesthesia Type: General  Level of Consciousness: awake and alert   Airway and Oxygen Therapy: Patient Spontanous Breathing  Post-op Pain: mild  Post-op Assessment: Post-op Vital signs reviewed, Patient's Cardiovascular Status Stable, Respiratory Function Stable, Patent Airway and No signs of Nausea or vomiting  Last Vitals:  Filed Vitals:   01/19/15 1925  BP:   Pulse: 65  Temp:   Resp: 14    Post-op Vital Signs: stable   Complications: No apparent anesthesia complications

## 2015-01-19 NOTE — Anesthesia Preprocedure Evaluation (Addendum)
Anesthesia Evaluation  Patient identified by MRN, date of birth, ID band Patient awake    Reviewed: Allergy & Precautions, H&P , NPO status , Patient's Chart, lab work & pertinent test results, reviewed documented beta blocker date and time   Airway Mallampati: II  TM Distance: >3 FB Neck ROM: full    Dental  (+) Dental Advisory Given, Caps Complete upper and lower bridges:   Pulmonary neg pulmonary ROS,  breath sounds clear to auscultation  Pulmonary exam normal       Cardiovascular Exercise Tolerance: Good hypertension, Pt. on home beta blockers and Pt. on medications Rhythm:regular Rate:Normal  palpitations   Neuro/Psych glaucoma negative neurological ROS  negative psych ROS   GI/Hepatic negative GI ROS, Neg liver ROS,   Endo/Other  diabetes, Well Controlled, Type 2, Oral Hypoglycemic Agents  Renal/GU negative Renal ROS  negative genitourinary   Musculoskeletal   Abdominal   Peds  Hematology negative hematology ROS (+)   Anesthesia Other Findings   Reproductive/Obstetrics negative OB ROS                            Anesthesia Physical Anesthesia Plan  ASA: III  Anesthesia Plan: General   Post-op Pain Management:    Induction: Intravenous  Airway Management Planned: Oral ETT  Additional Equipment:   Intra-op Plan:   Post-operative Plan: Extubation in OR  Informed Consent: I have reviewed the patients History and Physical, chart, labs and discussed the procedure including the risks, benefits and alternatives for the proposed anesthesia with the patient or authorized representative who has indicated his/her understanding and acceptance.   Dental Advisory Given  Plan Discussed with: CRNA and Surgeon  Anesthesia Plan Comments:         Anesthesia Quick Evaluation

## 2015-01-19 NOTE — H&P (View-Only) (Signed)
Consult Note: Gyn-Onc  Consult was requested by Dr. [name] for the evaluation of Brittany Schwartz 64 y.o. female  CC:  Chief Complaint  Patient presents with  . Endometrial cancer    Assessment/Plan:  [name]  is a 64 y.o.  year old with grade 3 endometrial cancer.   A detailed discussion was held with the patient and her family with regard to to her endometrial cancer diagnosis. We discussed the standard management options for uterine cancer which includes surgery followed possibly by adjuvant therapy depending on the results of surgery. The options for surgical management include a hysterectomy and removal of the tubes and ovaries possibly with removal of pelvic and para-aortic lymph nodes. A minimally invasive approach including a robotic hysterectomy or laparoscopic hysterectomy have benefits including shorter hospital stay, recovery time and better wound healing. The alternative approach is an open hysterectomy. The patient has been counseled about these surgical options and the risks of surgery in general including infection, bleeding, damage to surrounding structures (including bowel, bladder, ureters, nerves or vessels), and the postoperative risks of PE/ DVT, and lymphedema. I extensively reviewed the additional risks of robotic hysterectomy including possible need for conversion to open laparotomy.  I discussed positioning during surgery of trendelenberg and risks of minor facial swelling and care we take in preoperative positioning.  After counseling and consideration of her options, she desires to proceed with robotic total hysterectomy, BSO, pelvic and para-aortic lymphadenectomy on February 18th, 2016.   We will schedule a reoperative CT scan of the chest abdomen and pelvis given her grade 3 pathology and the increased risk for metastatic disease which might alter surgical plan.  She will be seen by anesthesia for preoperative clearance and discussion of postoperative pain  management.  She was given the opportunity to ask questions, which were answered to her satisfaction, and she is agreement with the above mentioned plan of care.   HPI: [name] is a 64 year old G2 P2 who is seen in consultation at the request of Dr. [name] for grade 3 endometrial adenocarcinoma. The patient reports a 1 year history of vaginal spotting. She saw Dr. [name] on 01/03/2015 and after reporting this history a pelvic ultrasound was performed. On ultrasound the uterus measured 8 x 5 x 4 cm with a 33 mm endometrial stripe and increased vascularity. The ovaries appeared normal and no fluid was noted in the cul-de-sac. An endometrial biopsy was performed on every second 2016 and this revealed grade 3 endometrial adenocarcinoma.  She is no family history for cancers. Her major risk factors for endometrial cancer include history of diabetes mellitus and hypertension. She's been worked up for a fast heart beat approximate 12 months ago and was placed on a beta blocker but workup was otherwise unremarkable. She has no prior surgical history.  Interval History: Her bleeding increased slightly after the endometrial biopsy however remains light.  Current Meds:  Outpatient Encounter Prescriptions as of 01/09/2015  Medication Sig  . atenolol-chlorthalidone (TENORETIC) 50-25 MG per tablet Take 1 tablet by mouth daily.  . cholecalciferol (VITAMIN D) 1000 UNITS tablet Take 1,000 Units by mouth daily.  Marland Kitchen glipiZIDE (GLUCOTROL XL) 5 MG 24 hr tablet Take 5 mg by mouth 2 (two) times daily.  Marland Kitchen latanoprost (XALATAN) 0.005 % ophthalmic solution Place 1 drop into both eyes at bedtime.  Marland Kitchen lisinopril (PRINIVIL,ZESTRIL) 5 MG tablet Take 5 mg by mouth daily.  . metFORMIN (GLUCOPHAGE) 1000 MG tablet Take 1,000 mg by mouth 2 (two) times daily  with a meal.  . OVER THE COUNTER MEDICATION 1 tablet.  . pravastatin (PRAVACHOL) 20 MG tablet Take 20 mg by mouth daily.    Allergy: Not on File  Social Hx:   History   Social  History  . Marital Status: Married    Spouse Name: N/A    Number of Children: N/A  . Years of Education: N/A   Occupational History  . Not on file.   Social History Main Topics  . Smoking status: Never Smoker   . Smokeless tobacco: Not on file  . Alcohol Use: No  . Drug Use: No  . Sexual Activity: Yes   Other Topics Concern  . Not on file   Social History Narrative  . No narrative on file    Past Surgical Hx: History reviewed. No pertinent past surgical history.  Past Medical Hx:  Past Medical History  Diagnosis Date  . Diabetes mellitus without complication   . Hypertension   . Uterine cancer   . Glaucoma     Past Gynecological History:  SVD x 2, postmenopausal  No LMP recorded.  Family Hx: History reviewed. No pertinent family history.  Review of Systems:  Constitutional  Feels well,    ENT Normal appearing ears and nares bilaterally Skin/Breast  No rash, sores, jaundice, itching, dryness Cardiovascular  No chest pain, shortness of breath, or edema  Pulmonary  No cough or wheeze.  Gastro Intestinal  No nausea, vomitting, or diarrhoea. No bright red blood per rectum, no abdominal pain, change in bowel movement, or constipation.  Genito Urinary  No frequency, urgency, dysuria, + postmenopausal bleeding Musculo Skeletal  No myalgia, arthralgia, joint swelling or pain  Neurologic  No weakness, numbness, change in gait,  Psychology  No depression, anxiety, insomnia.   Vitals:  Blood pressure 167/73, pulse 70, temperature 98.2 F (36.8 C), temperature source Oral, resp. rate 18.  Physical Exam: WD in NAD Neck  Supple NROM, without any enlargements.  Lymph Node Survey No cervical supraclavicular or inguinal adenopathy Cardiovascular  Pulse normal rate, regularity and rhythm. S1 and S2 normal.  Lungs  Clear to auscultation bilateraly, without wheezes/crackles/rhonchi. Good air movement.  Skin  No rash/lesions/breakdown  Psychiatry  Alert and  oriented to person, place, and time  Abdomen  Normoactive bowel sounds, abdomen soft, non-tender and thin without evidence of hernia. No palpable masses Back No CVA tenderness Genito Urinary  Vulva/vagina: Normal external female genitalia.   No lesions. No discharge or bleeding.  There is an excoriation on the left labia minora which does not appear consistent with dysplasia or malignancy.  Bladder/urethra:  No lesions or masses, well supported bladder  Vagina: normal in appearance, no metastases apparent  Cervix: Normal appearing, no lesions.  Uterus: Small, mobile, no parametrial involvement or nodularity.  Adnexa: no palpable masses. Rectal  Good tone, no masses no cul de sac nodularity.  Extremities  No bilateral cyanosis, clubbing or edema.   Donaciano Eva, MD   01/09/2015, 10:20 AM

## 2015-01-19 NOTE — Interval H&P Note (Signed)
History and Physical Interval Note:  01/19/2015 3:38 PM  Brittany Schwartz  has presented today for surgery, with the diagnosis of ENDOMETRIAL CANCER  The various methods of treatment have been discussed with the patient and family. After consideration of risks, benefits and other options for treatment, the patient has consented to  Procedure(s): XI ROBOTIC ASSISTED TOTAL HYSTERECTOMY WITH BILATERAL SALPINGO OOPHORECTOMY (Bilateral) XI ROBOTIC BILATERAL PELVIC AND PARA-AORTIC LYMPH NODE DISSECTION (Bilateral) as a surgical intervention .  The patient's history has been reviewed, patient examined, no change in status, stable for surgery.  I have reviewed the patient's chart and labs.  Questions were answered to the patient's satisfaction.     [place], [facility]

## 2015-01-20 ENCOUNTER — Encounter (HOSPITAL_COMMUNITY): Payer: Self-pay | Admitting: Gynecologic Oncology

## 2015-01-20 ENCOUNTER — Other Ambulatory Visit: Payer: Self-pay | Admitting: Gynecologic Oncology

## 2015-01-20 DIAGNOSIS — C541 Malignant neoplasm of endometrium: Secondary | ICD-10-CM | POA: Diagnosis not present

## 2015-01-20 LAB — CBC
HEMATOCRIT: 32.4 % — AB (ref 36.0–46.0)
HEMOGLOBIN: 10.9 g/dL — AB (ref 12.0–15.0)
MCH: 28 pg (ref 26.0–34.0)
MCHC: 33.6 g/dL (ref 30.0–36.0)
MCV: 83.3 fL (ref 78.0–100.0)
Platelets: 167 10*3/uL (ref 150–400)
RBC: 3.89 MIL/uL (ref 3.87–5.11)
RDW: 13 % (ref 11.5–15.5)
WBC: 11.1 10*3/uL — ABNORMAL HIGH (ref 4.0–10.5)

## 2015-01-20 LAB — BASIC METABOLIC PANEL
Anion gap: 7 (ref 5–15)
BUN: 12 mg/dL (ref 6–23)
CO2: 25 mmol/L (ref 19–32)
Calcium: 8.5 mg/dL (ref 8.4–10.5)
Chloride: 97 mmol/L (ref 96–112)
Creatinine, Ser: 0.97 mg/dL (ref 0.50–1.10)
GFR calc Af Amer: 70 mL/min — ABNORMAL LOW (ref >90)
GFR, EST NON AFRICAN AMERICAN: 60 mL/min — AB (ref >90)
GLUCOSE: 268 mg/dL — AB (ref 70–99)
Potassium: 4.7 mmol/L (ref 3.5–5.1)
Sodium: 129 mmol/L — ABNORMAL LOW (ref 135–145)

## 2015-01-20 LAB — GLUCOSE, CAPILLARY: Glucose-Capillary: 250 mg/dL — ABNORMAL HIGH (ref 70–99)

## 2015-01-20 MED ORDER — OXYCODONE-ACETAMINOPHEN 5-325 MG PO TABS: 1.0000 | ORAL_TABLET | ORAL | Status: DC | PRN

## 2015-01-20 MED ORDER — ONDANSETRON HCL 4 MG PO TABS: 4.0000 mg | ORAL_TABLET | Freq: Four times a day (QID) | ORAL | Status: DC | PRN

## 2015-01-20 NOTE — Discharge Summary (Signed)
Physician Discharge Summary  Patient ID: Brittany Schwartz MRN: [id number] DOB/AGE: [birth year] 65 y.o.  Admit date: 01/19/2015 Discharge date: 01/20/2015  Admission Diagnoses: Endometrial cancer  Discharge Diagnoses:  Principal Problem:   Endometrial cancer Active Problems:   Uterine cancer   Discharged Condition:  The patient is in good condition and stable for discharge.    Hospital Course: On 01/19/2015, the patient underwent the following: Procedure(s):  XI ROBOTIC ASSISTED TOTAL HYSTERECTOMY WITH BILATERAL SALPINGO OOPHORECTOMY.   The postoperative course was uneventful.  She was discharged to home on postoperative day 1 tolerating a regular diet with minimal pain.  Consults: None  Significant Diagnostic Studies: None  Treatments: surgery: see above  Discharge Exam: Blood pressure 117/65, pulse 66, temperature 98.3 F (36.8 C), temperature source Oral, resp. rate 18, height 5\' 1"  (1.549 m), weight 124 lb (56.246 kg), SpO2 100 %. General appearance: alert, cooperative and no distress Resp: clear to auscultation bilaterally Cardio: regular rate and rhythm, S1, S2 normal, no murmur, click, rub or gallop GI: soft, non-tender; bowel sounds normal; no masses,  no organomegaly Extremities: extremities normal, atraumatic, no cyanosis or edema Incision/Wound: Lap sites with dermabond without erythema or drainage  Disposition: Home      Discharge Instructions    Call MD for:  difficulty breathing, headache or visual disturbances    Complete by:  As directed      Call MD for:  extreme fatigue    Complete by:  As directed      Call MD for:  hives    Complete by:  As directed      Call MD for:  persistant dizziness or light-headedness    Complete by:  As directed      Call MD for:  persistant nausea and vomiting    Complete by:  As directed      Call MD for:  redness, tenderness, or signs of infection (pain, swelling, redness, odor or green/yellow discharge around incision  site)    Complete by:  As directed      Call MD for:  severe uncontrolled pain    Complete by:  As directed      Call MD for:  temperature >100.4    Complete by:  As directed      Diet - low sodium heart healthy    Complete by:  As directed      Driving Restrictions    Complete by:  As directed   No driving for 1 week.  Do not take narcotics and drive.     Increase activity slowly    Complete by:  As directed      Lifting restrictions    Complete by:  As directed   No lifting greater than 10 lbs.     Sexual Activity Restrictions    Complete by:  As directed   No sexual activity, nothing in the vagina, for 8 weeks.            Medication List    TAKE these medications        atenolol-chlorthalidone 50-25 MG per tablet  Commonly known as:  TENORETIC  Take 1 tablet by mouth every morning.     cholecalciferol 400 UNITS Tabs tablet  Commonly known as:  VITAMIN D  Take 800 Units by mouth daily.     glipiZIDE 5 MG 24 hr tablet  Commonly known as:  GLUCOTROL XL  Take 5 mg by mouth 2 (two) times daily.     latanoprost 0.005 %  ophthalmic solution  Commonly known as:  XALATAN  Place 1 drop into both eyes at bedtime.     lisinopril 10 MG tablet  Commonly known as:  PRINIVIL,ZESTRIL  Take 10 mg by mouth at bedtime.     metFORMIN 500 MG tablet  Commonly known as:  GLUCOPHAGE  Take 500 mg by mouth 2 (two) times daily with a meal.     multivitamin with minerals Tabs tablet  Take 1 tablet by mouth daily.     ondansetron 4 MG tablet  Commonly known as:  ZOFRAN  Take 1 tablet (4 mg total) by mouth every 6 (six) hours as needed for nausea.     oxyCODONE-acetaminophen 5-325 MG per tablet  Commonly known as:  PERCOCET/ROXICET  Take 1-2 tablets by mouth every 4 (four) hours as needed (moderate to severe pain).     pravastatin 20 MG tablet  Commonly known as:  PRAVACHOL  Take 20 mg by mouth daily.     timolol 0.5 % ophthalmic solution  Commonly known as:  BETIMOL  Place 1  drop into both eyes 2 (two) times daily.       Follow-up Information    Follow up with Donaciano Eva, MD On 02/03/2015.   Specialty:  Obstetrics and Gynecology   Why:  at the [facility] at 10:45am.   Contact information:   [address] [place] [address] [phone]       Greater than thirty minutes were spend for face to face discharge instructions and discharge orders/summary in EPIC.   Signed: CROSS, MELISSA DEAL 01/20/2015, 1:27 PM

## 2015-01-20 NOTE — Progress Notes (Signed)
Discharge instructions given along with prescriptions, Questions answered.

## 2015-01-20 NOTE — Discharge Instructions (Addendum)
01/20/2015  Return to work: 4-6 weeks if applicable  Activity: 1. Be up and out of the bed during the day.  Take a nap if needed.  You may walk up steps but be careful and use the hand rail.  Stair climbing will tire you more than you think, you may need to stop part way and rest.   2. No lifting or straining for 6 weeks.  3. No driving for 1 week(s).  Do not drive if you are taking narcotic pain medicine.  4. Shower daily.  Use soap and water on your incision and pat dry; don't rub.  No tub baths until cleared by your surgeon.   5. No sexual activity and nothing in the vagina for 8 weeks.  Diet: 1. Low sodium Heart Healthy Diet is recommended.  2. It is safe to use a laxative, such as Miralax or Colace, if you have difficulty moving your bowels.   Wound Care: 1. Keep clean and dry.  Shower daily.  Reasons to call the Doctor:  Fever - Oral temperature greater than 100.4 degrees Fahrenheit  Foul-smelling vaginal discharge  Difficulty urinating  Nausea and vomiting  Increased pain at the site of the incision that is unrelieved with pain medicine.  Difficulty breathing with or without chest pain  New calf pain especially if only on one side  Sudden, continuing increased vaginal bleeding with or without clots.   Contacts: For questions or concerns you should contact:   Dr. Everitt Amber at [phone]  Joylene John, NP at [phone]  After Hours: call [phone] and ask for the GYN Oncologist on call  Oxycodone tablets or capsules What is this medicine? OXYCODONE (ox i KOE done) is a pain reliever. It is used to treat moderate to severe pain. This medicine may be used for other purposes; ask your health care provider or pharmacist if you have questions. COMMON BRAND NAME(S): Dazidox, Endocodone, OXECTA, OxyIR, Percolone, Roxicodone What should I tell my health care provider before I take this medicine? They need to know if you have any of these  conditions: -Addison's disease -brain tumor -drug abuse or addiction -head injury -heart disease -if you frequently drink alcohol containing drinks -kidney disease or problems going to the bathroom -liver disease -lung disease, asthma, or breathing problems -mental problems -an unusual or allergic reaction to oxycodone, codeine, hydrocodone, morphine, other medicines, foods, dyes, or preservatives -pregnant or trying to get pregnant -breast-feeding How should I use this medicine? Take this medicine by mouth with a glass of water. Follow the directions on the prescription label. You can take it with or without food. If it upsets your stomach, take it with food. Take your medicine at regular intervals. Do not take it more often than directed. Do not stop taking except on your doctor's advice. Some brands of this medicine, like Oxecta, have special instructions. Ask your doctor or pharmacist if these directions are for you: Do not cut, crush or chew this medicine. Swallow only one tablet at a time. Do not wet, soak, or lick the tablet before you take it. Talk to your pediatrician regarding the use of this medicine in children. Special care may be needed. Overdosage: If you think you have taken too much of this medicine contact a poison control center or emergency room at once. NOTE: This medicine is only for you. Do not share this medicine with others. What if I miss a dose? If you miss a dose, take it as soon as you can.  If it is almost time for your next dose, take only that dose. Do not take double or extra doses. What may interact with this medicine? -alcohol -antihistamines -certain medicines used for nausea like chlorpromazine, droperidol -erythromycin -ketoconazole -medicines for depression, anxiety, or psychotic disturbances -medicines for sleep -muscle relaxants -naloxone -naltrexone -narcotic medicines (opiates) for  pain -nilotinib -phenobarbital -phenytoin -rifampin -ritonavir -voriconazole This list may not describe all possible interactions. Give your health care provider a list of all the medicines, herbs, non-prescription drugs, or dietary supplements you use. Also tell them if you smoke, drink alcohol, or use illegal drugs. Some items may interact with your medicine. What should I watch for while using this medicine? Tell your doctor or health care professional if your pain does not go away, if it gets worse, or if you have new or a different type of pain. You may develop tolerance to the medicine. Tolerance means that you will need a higher dose of the medicine for pain relief. Tolerance is normal and is expected if you take this medicine for a long time. Do not suddenly stop taking your medicine because you may develop a severe reaction. Your body becomes used to the medicine. This does NOT mean you are addicted. Addiction is a behavior related to getting and using a drug for a non-medical reason. If you have pain, you have a medical reason to take pain medicine. Your doctor will tell you how much medicine to take. If your doctor wants you to stop the medicine, the dose will be slowly lowered over time to avoid any side effects. You may get drowsy or dizzy when you first start taking this medicine or change doses. Do not drive, use machinery, or do anything that may be dangerous until you know how the medicine affects you. Stand or sit up slowly. There are different types of narcotic medicines (opiates) for pain. If you take more than one type at the same time, you may have more side effects. Give your health care provider a list of all medicines you use. Your doctor will tell you how much medicine to take. Do not take more medicine than directed. Call emergency for help if you have problems breathing. This medicine will cause constipation. Try to have a bowel movement at least every 2 to 3 days. If you do  not have a bowel movement for 3 days, call your doctor or health care professional. Your mouth may get dry. Drinking water, chewing sugarless gum, or sucking on hard candy may help. See your dentist every 6 months. What side effects may I notice from receiving this medicine? Side effects that you should report to your doctor or health care professional as soon as possible: -allergic reactions like skin rash, itching or hives, swelling of the face, lips, or tongue -breathing problems -confusion -feeling faint or lightheaded, falls -trouble passing urine or change in the amount of urine -unusually weak or tired Side effects that usually do not require medical attention (report to your doctor or health care professional if they continue or are bothersome): -constipation -dry mouth -itching -nausea, vomiting -upset stomach This list may not describe all possible side effects. Call your doctor for medical advice about side effects. You may report side effects to FDA at 1-800-FDA-1088. Where should I keep my medicine? Keep out of the reach of children. This medicine can be abused. Keep your medicine in a safe place to protect it from theft. Do not share this medicine with anyone. Selling or  giving away this medicine is dangerous and against the law. Store at room temperature between 15 and 30 degrees C (59 and 86 degrees F). Protect from light. Keep container tightly closed. This medicine may cause accidental overdose and death if it is taken by other adults, children, or pets. Flush any unused medicine down the toilet to reduce the chance of harm. Do not use the medicine after the expiration date. NOTE: This sheet is a summary. It may not cover all possible information. If you have questions about this medicine, talk to your doctor, pharmacist, or health care provider.  2015, [employer]vier/Gold Standard. (2013-07-29 13:43:33)  Abdominal Hysterectomy, Care After These instructions give you information  on caring for yourself after your procedure. Your doctor may also give you more specific instructions. Call your doctor if you have any problems or questions after your procedure.  HOME CARE It takes 4-6 weeks to recover from this surgery. Follow all of your doctor's instructions.   Only take medicines as told by your doctor.  Change your bandage as told by your doctor.  Return to your doctor to have your stitches taken out.  Take showers for 2-3 weeks. Ask your doctor when it is okay to shower.  Do not douche, use tampons, or have sex (intercourse) for at least 6 weeks or as told.  Follow your doctor's advice about exercise, lifting objects, driving, and general activities.  Get plenty of rest and sleep.  Do not lift anything heavier than a gallon of milk (about 10 pounds [4.5 kilograms]) for the first month after surgery.  Get back to your normal diet as told by your doctor.  Do not drink alcohol until your doctor says it is okay.  Take a medicine to help you poop (laxative) as told by your doctor.  Eating foods high in fiber may help you poop. Eat a lot of raw fruits and vegetables, whole grains, and beans.  Drink enough fluids to keep your pee (urine) clear or pale yellow.  Have someone help you at home for 1-2 weeks after your surgery.  Keep follow-up doctor visits as told. GET HELP IF:  You have chills or fever.  You have puffiness, redness, or pain in area of the cut (incision).  You have yellowish-white fluid (pus) coming from the cut.  You have a bad smell coming from the cut or bandage.  Your cut pulls apart.  You feel dizzy or light-headed.  You have pain or bleeding when you pee.  You keep having watery poop (diarrhea).  You keep feeling sick to your stomach (nauseous) or keep throwing up (vomiting).  You have fluid (discharge) coming from your vagina.  You have a rash.  You have a reaction to your medicine.  You need stronger pain medicine. GET  HELP RIGHT AWAY IF:   You have a fever and your symptoms suddenly get worse.  You have bad belly (abdominal) pain.  You have chest pain.  You are short of breath.  You pass out (faint).  You have pain, puffiness, or redness of your leg.  You bleed a lot from your vagina and notice clumps of tissue (clots). MAKE SURE YOU:   Understand these instructions.  Will watch your condition.  Will get help right away if you are not doing well or get worse. Document Released: 08/27/2008 Document Revised: 11/23/2013 Document Reviewed: 09/10/2013 [facility] Patient Information 2015 [place], [place]. This information is not intended to replace advice given to you by your health care provider.  Make sure you discuss any questions you have with your health care provider.

## 2015-01-20 NOTE — Progress Notes (Signed)
1 Day Post-Op Procedure(s) (LRB): XI ROBOTIC ASSISTED TOTAL HYSTERECTOMY WITH BILATERAL SALPINGO OOPHORECTOMY (Bilateral)  Subjective: Patient reports feeling well,  Minimal pain. No flatus no nausea.    Objective: Vital signs in last 24 hours: Temp:  [97.5 F (36.4 C)-98.9 F (37.2 C)] 98.3 F (36.8 C) (02/19 1000) Pulse Rate:  [60-98] 66 (02/19 1000) Resp:  [10-18] 18 (02/19 1000) BP: (117-166)/(39-78) 117/65 mmHg (02/19 1000) SpO2:  [100 %] 100 % (02/19 1000) Weight:  [124 lb (56.246 kg)] 124 lb (56.246 kg) (02/18 2014) Last BM Date: 01/18/15  Intake/Output from previous day: 02/18 0701 - 02/19 0700 In: 2594.2 [P.O.:120; I.V.:2349.2] Out: 1150 [Urine:1075; Blood:75]  Physical Examination: General: alert and cooperative Resp: clear to auscultation bilaterally Cardio: regular rate and rhythm, S1, S2 normal, no murmur, click, rub or gallop GI: soft, non-tender; bowel sounds normal; no masses,  no organomegaly Extremities: extremities normal, atraumatic, no cyanosis or edema Vaginal Bleeding: none  Incisions: clean, dry, intact  Labs: WBC/Hgb/Hct/Plts:  11.1/10.9/32.4/167 (02/19 0500) BUN/Cr/glu/ALT/AST/amyl/lip:  12/0.97/--/--/--/--/-- (02/19 0500)   Assessment:  64 y.o. s/p Procedure(s): XI ROBOTIC ASSISTED TOTAL HYSTERECTOMY WITH BILATERAL SALPINGO OOPHORECTOMY: stable Pain:  Pain is well-controlled on oral medications.  Heme: appropriate postop Hb  ID: no issues  with . The CT scan was negative: Marland Kitchen The CT scan had the following: .  The AXR was negative: Marland Kitchen  The findings on AXR were: . GI stress ulcer prophylaxis includes: . Treatment for N/V: . CV: hemodynamically stable.  GI:  Tolerating po: Yes   Advance diet.  FEN: heplock IVF  Endo: no issues. Marland Kitchen  Prophylaxis: pharmacologic prophylaxis (with any of the following: enoxaparin (Lovenox) 40mg  SQ 2 hours prior to surgery then every day).  Plan: Advance diet Encourage ambulation Discharge home Dispo:   The  patient is to be discharged to home.  I discussed her cancer diagnosis with the patient and her family. 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 They are aware that she will require adjuvant therapy postop.     Donaciano Eva 01/20/2015, 11:45 AM

## 2015-01-21 LAB — GLUCOSE, CAPILLARY: Glucose-Capillary: 169 mg/dL — ABNORMAL HIGH (ref 70–99)

## 2015-01-23 ENCOUNTER — Other Ambulatory Visit: Payer: Self-pay | Admitting: Oncology

## 2015-01-23 ENCOUNTER — Telehealth: Payer: Self-pay | Admitting: Oncology

## 2015-01-23 NOTE — Telephone Encounter (Signed)
S/W PTS DTR IN REF TO NP APPT ON 02/02/15@3 :00 CHEMO ED 3/25@10 :00

## 2015-01-23 NOTE — Telephone Encounter (Signed)
PT'S DTR AWARE OF NEW DATE AND TIME 02/09/15@3 :00

## 2015-01-26 ENCOUNTER — Encounter: Payer: Self-pay | Admitting: *Deleted

## 2015-01-26 ENCOUNTER — Other Ambulatory Visit: Payer: 59

## 2015-01-30 ENCOUNTER — Ambulatory Visit: Payer: 59 | Admitting: Gynecologic Oncology

## 2015-02-01 ENCOUNTER — Telehealth: Payer: Self-pay | Admitting: *Deleted

## 2015-02-01 NOTE — Telephone Encounter (Signed)
Called and spoke with patient's daughter, [name], and she is agreeable to bring her mom to new appt time on Friday, 02/03/15, at 11:15 am.

## 2015-02-02 ENCOUNTER — Ambulatory Visit: Payer: 59

## 2015-02-02 ENCOUNTER — Other Ambulatory Visit: Payer: 59

## 2015-02-02 ENCOUNTER — Ambulatory Visit: Payer: 59 | Admitting: Oncology

## 2015-02-03 ENCOUNTER — Ambulatory Visit: Payer: 59 | Attending: Gynecologic Oncology | Admitting: Gynecologic Oncology

## 2015-02-03 ENCOUNTER — Ambulatory Visit: Payer: 59

## 2015-02-03 ENCOUNTER — Ambulatory Visit: Payer: 59 | Admitting: Gynecologic Oncology

## 2015-02-03 ENCOUNTER — Encounter: Payer: Self-pay | Admitting: Gynecologic Oncology

## 2015-02-03 VITALS — BP 160/64 | HR 78 | Temp 99.1°F | Resp 18 | Ht 61.0 in | Wt 125.9 lb

## 2015-02-03 DIAGNOSIS — Z90710 Acquired absence of both cervix and uterus: Secondary | ICD-10-CM | POA: Diagnosis not present

## 2015-02-03 DIAGNOSIS — R300 Dysuria: Secondary | ICD-10-CM

## 2015-02-03 DIAGNOSIS — C541 Malignant neoplasm of endometrium: Secondary | ICD-10-CM

## 2015-02-03 DIAGNOSIS — Z483 Aftercare following surgery for neoplasm: Secondary | ICD-10-CM

## 2015-02-03 LAB — URINALYSIS, MICROSCOPIC - CHCC
Bilirubin (Urine): NEGATIVE
GLUCOSE UR CHCC: 100 mg/dL
Ketones: NEGATIVE mg/dL
NITRITE: NEGATIVE
Protein: NEGATIVE mg/dL
Specific Gravity, Urine: 1.005 (ref 1.003–1.035)
Urobilinogen, UR: 0.2 mg/dL (ref 0.2–1)
pH: 7 (ref 4.6–8.0)

## 2015-02-03 MED ORDER — NITROFURANTOIN MONOHYD MACRO 100 MG PO CAPS: 100.0000 mg | ORAL_CAPSULE | Freq: Two times a day (BID) | ORAL | Status: DC

## 2015-02-03 NOTE — Progress Notes (Signed)
POSTOPERATIVE VISIT  HPI:  Brittany Schwartz is a 64 y.o. year old referred by Dr [name], initially seen in consultation on 01/09/15 for serous endometrial cancer.  She then underwent a robotic hysterectomy, BSO, resection of sigmoid colon nodule on 6/76/19 without complications.  Her postoperative course was uncomplicated.  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 There was no cervical stromal involvement. There was involvement of the anterior uterine peritoneal reflection and left fallopian tube. Intraoperatively, Dr [name] visualized millial disease/tumor studding of the bladder peritoneum and pelvic peritoneum, but no upper abdominal visible disease. The omentum appeared normal.  She is seen today for a postoperative check and to discuss her pathology results and treatment plan.  Since discharge from the hospital, she is feeling overall well. However she has some dysuria and urinary frequency that has developed in the last 24 hours.  She has improving appetite, and pain controlled with minimal PO medication. She has no other complaints today.    Review of systems: Constitutional:  She has no weight gain or weight loss. She has no fever or chills. Eyes: No blurred vision Ears, Nose, Mouth, Throat: No dizziness, headaches or changes in hearing. No mouth sores. Cardiovascular: No chest pain, palpitations or edema. Respiratory:  No shortness of breath, wheezing or cough Gastrointestinal: She has normal bowel movements without diarrhea or constipation. She denies any nausea or vomiting. She denies blood in her stool or heart burn. Genitourinary:  She denies pelvic pain, pelvic pressure or changes in her urinary function. She has no hematuria, dysuria, or incontinence. She has no irregular vaginal bleeding or vaginal discharge Musculoskeletal:  Denies muscle weakness or joint pains.  Skin:  She has no skin changes, rashes or itching Neurological:  Denies dizziness or headaches. No neuropathy, no numbness or tingling. Psychiatric:  She denies depression or anxiety. Hematologic/Lymphatic:   No easy bruising or bleeding   Physical Exam: Blood pressure 160/64, pulse 78, temperature 99.1 F (37.3 C), temperature source Oral, resp. rate 18, height 5\' 1"  (1.549 m), weight 125 lb 14.4 oz (57.108 kg). General: Well dressed, well nourished in no apparent distress.   HEENT:  Normocephalic and atraumatic, no lesions.  Extraocular muscles intact. Sclerae anicteric. Pupils equal, round, reactive. No mouth sores or ulcers. Thyroid is normal size, not nodular, midline. Skin:  No lesions or rashes. Breasts: deferred Lungs:  deferred Cardiovascular: deferred Abdomen:  Soft, nontender, nondistended.  No palpable masses.  No hepatosplenomegaly.  No ascites. Normal bowel sounds.  No hernias.  Incisions are well healed. Genitourinary: Normal EGBUS  Vaginal cuff intact.  No bleeding or discharge.  No cul de sac fullness. Extremities: No cyanosis, clubbing or edema.  No calf tenderness or erythema. No palpable cords. Psychiatric: Mood and affect are appropriate. Neurological: Awake, alert and oriented x 3. Sensation is intact, no neuropathy.  Musculoskeletal: No pain, normal strength and range of motion.  Assessment:    64 y.o. 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   S/p robotic hysterectomy, BSO and resection of sigmoid nodule on 01/19/15. + LVSI, 60% myometrial invasion,  lymph nodes not assessed.   Plan: 1) Pathology reports reviewed today 2) Treatment counseling - I discussed the high risk for recurrence/progression given her advanced stage disease. All macroscopic disease was present in the pelvis however described to the patient and her family that with serous endometrial cancer there  is a high risk for relapse  at distant sites. For this reason I am recommending adjuvant chemotherapy with 6 cycles of Carboplatin and paclitaxel. If she has complete response and no macroscopic residual disease after completing this therapy I would recommend consolidation of the pelvic anatomy (which had heavy tumor burden) with adjuvant external beam radiation to the pelvis and vaginal brachytherapy. She was given the opportunity to ask questions, which were answered to her satisfaction, and she is agreement with the above mentioned plan of care.  3)  Dysuria postop - concerning for UTI. Recommend sending UA and culture and empiric macrobid x 7 days. No evidence for systemic urosepsis.  4) Return to clinic after completing systemic therapy. Donaciano Eva, MD

## 2015-02-03 NOTE — Patient Instructions (Signed)
Plan to follow up with Dr. Marko Plume as scheduled.  Plan for six cycles of chemotherapy followed by radiation.  GYN ONC will see you after the completion of you chemo.

## 2015-02-04 LAB — URINE CULTURE

## 2015-02-06 ENCOUNTER — Telehealth: Payer: Self-pay | Admitting: *Deleted

## 2015-02-06 NOTE — Telephone Encounter (Signed)
Called placed to Pt's daughter Sonal regarding urine culture shows mild infection, continue course of antibiotics.  Sonal confirmed understanding. Pt is not having any additional symptoms, doing well.

## 2015-02-08 ENCOUNTER — Other Ambulatory Visit: Payer: Self-pay | Admitting: Oncology

## 2015-02-08 DIAGNOSIS — C541 Malignant neoplasm of endometrium: Secondary | ICD-10-CM

## 2015-02-09 ENCOUNTER — Telehealth: Payer: Self-pay | Admitting: Oncology

## 2015-02-09 ENCOUNTER — Encounter: Payer: Self-pay | Admitting: Oncology

## 2015-02-09 ENCOUNTER — Ambulatory Visit (HOSPITAL_BASED_OUTPATIENT_CLINIC_OR_DEPARTMENT_OTHER): Payer: 59 | Admitting: Oncology

## 2015-02-09 ENCOUNTER — Other Ambulatory Visit (HOSPITAL_BASED_OUTPATIENT_CLINIC_OR_DEPARTMENT_OTHER): Payer: 59

## 2015-02-09 ENCOUNTER — Ambulatory Visit: Payer: 59

## 2015-02-09 VITALS — BP 160/68 | HR 86 | Temp 97.9°F | Resp 18 | Ht 61.0 in | Wt 125.0 lb

## 2015-02-09 DIAGNOSIS — C55 Malignant neoplasm of uterus, part unspecified: Secondary | ICD-10-CM

## 2015-02-09 DIAGNOSIS — E871 Hypo-osmolality and hyponatremia: Secondary | ICD-10-CM

## 2015-02-09 DIAGNOSIS — H409 Unspecified glaucoma: Secondary | ICD-10-CM

## 2015-02-09 DIAGNOSIS — E119 Type 2 diabetes mellitus without complications: Secondary | ICD-10-CM

## 2015-02-09 DIAGNOSIS — D649 Anemia, unspecified: Secondary | ICD-10-CM

## 2015-02-09 DIAGNOSIS — C541 Malignant neoplasm of endometrium: Secondary | ICD-10-CM

## 2015-02-09 DIAGNOSIS — I10 Essential (primary) hypertension: Secondary | ICD-10-CM

## 2015-02-09 DIAGNOSIS — E878 Other disorders of electrolyte and fluid balance, not elsewhere classified: Secondary | ICD-10-CM

## 2015-02-09 LAB — COMPREHENSIVE METABOLIC PANEL (CC13)
ALBUMIN: 3.2 g/dL — AB (ref 3.5–5.0)
ALT: 42 U/L (ref 0–55)
AST: 24 U/L (ref 5–34)
Alkaline Phosphatase: 90 U/L (ref 40–150)
Anion Gap: 12 mEq/L — ABNORMAL HIGH (ref 3–11)
BUN: 11.3 mg/dL (ref 7.0–26.0)
CALCIUM: 9.7 mg/dL (ref 8.4–10.4)
CHLORIDE: 91 meq/L — AB (ref 98–109)
CO2: 24 mEq/L (ref 22–29)
Creatinine: 0.8 mg/dL (ref 0.6–1.1)
EGFR: 74 mL/min/{1.73_m2} — ABNORMAL LOW (ref >90)
GLUCOSE: 185 mg/dL — AB (ref 70–140)
POTASSIUM: 4.3 meq/L (ref 3.5–5.1)
Sodium: 127 mEq/L — ABNORMAL LOW (ref 136–145)
TOTAL PROTEIN: 7.7 g/dL (ref 6.4–8.3)
Total Bilirubin: 0.26 mg/dL (ref 0.20–1.20)

## 2015-02-09 LAB — CBC WITH DIFFERENTIAL/PLATELET
BASO%: 0.7 % (ref 0.0–2.0)
Basophils Absolute: 0.1 10*3/uL (ref 0.0–0.1)
EOS%: 3 % (ref 0.0–7.0)
Eosinophils Absolute: 0.3 10*3/uL (ref 0.0–0.5)
HEMATOCRIT: 29.6 % — AB (ref 34.8–46.6)
HGB: 10.2 g/dL — ABNORMAL LOW (ref 11.6–15.9)
LYMPH%: 25.6 % (ref 14.0–49.7)
MCH: 28 pg (ref 25.1–34.0)
MCHC: 34.5 g/dL (ref 31.5–36.0)
MCV: 81.3 fL (ref 79.5–101.0)
MONO#: 0.9 10*3/uL (ref 0.1–0.9)
MONO%: 9.6 % (ref 0.0–14.0)
NEUT%: 61.1 % (ref 38.4–76.8)
NEUTROS ABS: 5.8 10*3/uL (ref 1.5–6.5)
Platelets: 346 10*3/uL (ref 145–400)
RBC: 3.64 10*6/uL — ABNORMAL LOW (ref 3.70–5.45)
RDW: 12.6 % (ref 11.2–14.5)
WBC: 9.6 10*3/uL (ref 3.9–10.3)
lymph#: 2.5 10*3/uL (ref 0.9–3.3)

## 2015-02-09 MED ORDER — ONDANSETRON HCL 8 MG PO TABS: ORAL_TABLET | ORAL | Status: DC

## 2015-02-09 MED ORDER — DEXAMETHASONE 4 MG PO TABS: ORAL_TABLET | ORAL | Status: DC

## 2015-02-09 MED ORDER — LORAZEPAM 0.5 MG PO TABS: ORAL_TABLET | ORAL | Status: DC

## 2015-02-09 NOTE — Telephone Encounter (Signed)
per pof to sch pt appt-sent MW emailt o sch pt trmt-pt has MY CHART and will look up appts

## 2015-02-09 NOTE — Progress Notes (Signed)
Checked in new pt with no financial concerns at this time.  Pt has my card for any billing questions or concerns. °

## 2015-02-09 NOTE — Progress Notes (Signed)
[place] NEW PATIENT EVALUATION   Name: Brittany Schwartz Date: February 09, 2015  MRN: [id number] DOB: 02-19-1951  REFERRING PHYSICIAN: [name]/ [facility] cc [name], MD (PCP, [facility]), Aloha Gell   REASON FOR REFERRAL: IIIA grade 3 serous endometrial carcinoma   HISTORY OF PRESENT ILLNESS:Brittany Schwartz is a 64 y.o. 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 Surgery was by Dr [name] 01-19-15; post operative course has had no significant complications.  Patient presented to Dr [name] 01-03-15 with vaginal spotting x 1 year. Pelvic US showed 33 mm endometrial stripe with ovaries normal, and endometrial biopsy also 01-03-15 reportedly had grade 3 endometrial adenocarcinoma. She was seen in consultation by Dr Denman George on 2-8-1. CT CAP 01-13-15 had negative chest, no adenopathy, mild fatty liver, markedly thickened endometrium and no evidence of metastatic disease outside of uterus.  CA 125 preoperatively on 01-13-15 was 22.9. Surgery by Dr [name] 01-19-15 was robotic hysterectomy, BSO and resection of nodule at sigmoid serosa. At surgery there was milialtumor studding with 5-10 mm implants on  bladder peritoneum and miliary disease on pelvic sidewalls, without upper abdominal disease evident, and omentum appeared normal. Pathology ([id number]) found IIIA grade 3 serous/ endometrioid endometrial carcinoma with 8 mm / 12 mm myometrial invasion (60%), + LVSI, and extensive serous involvement of  sigmoid nodule. She was seen for post operative follow up by Dr Denman George on 02-03-15, with recommendation for 6 cycles of taxol carboplatin, then consideration of external beam RT + vaginal brachytherapy if CR. Patient and family attended chemotherapy teaching class prior to this visit.   Patient has been improving from surgery since DC home on  POD #1. She has had new watery vaginal discharge with slight blood since exam done 02-03-15, needing ~ 2 pads in 24 hours. Uses advil ~ 1x in 24 hours for discomfort since surgery; she is voiding normal amounts without difficulty now. (She had symptoms suggesting UTI at visit to Dr Denman George, begun empirically on macrobid, culture resulted no growth and symptoms improved with the macrobid.)    REVIEW OF SYSTEMS as above, also: Usual weight 125 lbs. Appetite adequate now, lifelong vegetarian. Bowels moving well now with addition of miralax. No HA. Wears reading glasses. No sinus symptoms, no difficutly hearing. No known thyroid disease. No dental concerns. No SOB or cough, no cardiac symptoms. No bladder symptoms. No other bleeding. Not on lovenox post op. No LE swelling or tenderness. Limiting lifting, not driving. No peripheral neuropathy. No difficulty with IV access. Remainder of full 10 point review of systems negative.   ALLERGIES: Review of patient's allergies indicates no known allergies.  PAST MEDICAL/ SURGICAL HISTORY:    G2P2 DM x 15 years, checks blood sugars at home HTN x 5 years Glaucoma, followed by ophth. No other surgery Mammograms done ~ Jan 2016 at Dr [facility] office  CURRENT MEDICATIONS: reviewed as listed now in EMR. Fine to add oral B12 due to vegetarian diet. Will need prescriptions for decadron 20 mg with food 12 hrs and 6 hrs prior to taxol, ativan 0.5 mg SL or po q 6 hr prn nausea and ondansetron 8 mg q 8 hrs prn  PHARMACY: [facility] [address] [place]   SOCIAL HISTORY:  Lived in [place] for 22 years prior to coming to [place] 5 years ago. She and husband live at [place], where husband is Freight forwarder.  1 daughter in [place] with 2 grands, 1 daughter in [place] with 2 grands. Nephew is pharmacist, has been in [employer] system in [place]. Never smoker, never drinker.  FAMILY HISTORY:   No known cancer Father with DM Daughters and grands healthy         PHYSICAL EXAM:  height  is 5' 1"  (1.549 m) and weight is 125 lb (56.7 kg). Her oral temperature is 97.9 F (36.6 C). Her blood pressure is 160/68 and her pulse is 86. Her respiration is 18 and oxygen saturation is 100%.  Alert, pleasant, cooperative lady, speaks and understands [place], tho daughter and nephew also interpret for her.  HEENT:normal hair pattern. PERRL, not icteric. Oral mucosa and posterior pharynx clear and moist. No obvious dental concerns. Neck supple without JVD or thyroid mass.  RESPIRATORY:lungs clear to A and P CARDIAC/ VASCULAR:heart RRR without murmur or gallop  ABDOMEN:soft, nontender, not obviously distended, surgical incisions closed, not tender or erythematous, diminished BS, no appreciable HSM or mass  LYMPH NODES:no cervical, supraclavicular, axillary or inguinal adenopathy  BREASTS:bilaterally without dominant mass, skin or nipple findings  NEUROLOGIC: Speech fluent and appropriate. CN, motor, sensory, cerebellar nonfocal. PSYCH appropriate mood and affect.  SKIN:without rash, ecchymosis, petechiae  MUSCULOSKELETAL:back nontender. Extremities without swelling, cords, tenderness. Good and symmetrical muscle mass.    LABORATORY DATA:  Results for orders placed or performed in visit on 02/09/15 (from the past 48 hour(s))  CBC with Differential     Status: Abnormal   Collection Time: 02/09/15  3:18 PM  Result Value Ref Range   WBC 9.6 3.9 - 10.3 10e3/uL   NEUT# 5.8 1.5 - 6.5 10e3/uL   HGB 10.2 (L) 11.6 - 15.9 g/dL   HCT 29.6 (L) 34.8 - 46.6 %   Platelets 346 145 - 400 10e3/uL   MCV 81.3 79.5 - 101.0 fL   MCH 28.0 25.1 - 34.0 pg   MCHC 34.5 31.5 - 36.0 g/dL   RBC 3.64 (L) 3.70 - 5.45 10e6/uL   RDW 12.6 11.2 - 14.5 %   lymph# 2.5 0.9 - 3.3 10e3/uL   MONO# 0.9 0.1 - 0.9 10e3/uL   Eosinophils Absolute 0.3 0.0 - 0.5 10e3/uL   Basophils Absolute 0.1 0.0 - 0.1 10e3/uL   NEUT% 61.1 38.4 - 76.8 %   LYMPH% 25.6 14.0 - 49.7 %   MONO% 9.6 0.0 - 14.0 %   EOS% 3.0 0.0 - 7.0 %    BASO% 0.7 0.0 - 2.0 %  Comprehensive metabolic panel (Cmet) - [facility]     Status: Abnormal   Collection Time: 02/09/15  3:18 PM  Result Value Ref Range   Sodium 127 (L) 136 - 145 mEq/L   Potassium 4.3 3.5 - 5.1 mEq/L   Chloride 91 (L) 98 - 109 mEq/L   CO2 24 22 - 29 mEq/L   Glucose 185 (H) 70 - 140 mg/dl   BUN 11.3 7.0 - 26.0 mg/dL   Creatinine 0.8 0.6 - 1.1 mg/dL   Total Bilirubin 0.26 0.20 - 1.20 mg/dL   Alkaline Phosphatase 90 40 - 150 U/L   AST 24 5 - 34 U/L   ALT 42 0 - 55 U/L   Total Protein 7.7 6.4 - 8.3 g/dL   Albumin 3.2 (L) 3.5 - 5.0 g/dL   Calcium 9.7 8.4 - 10.4 mg/dL   Anion Gap 12 (H) 3 - 11 mEq/L   EGFR 74 (L) >90 ml/min/1.73 m2    Comment: eGFR is calculated using the CKD-EPI  Creatinine Equation (2009)      PATHOLOGY: Accesssion: [id number] Received: 01/20/2015 [name], MD DOB: [birth year] Age: 58 Gender: F Reported: 01/23/2015 [address] FINAL DIAGNOSIS Diagnosis 1. Small Intestine Biopsy - HIGH GRADE CARCINOMA. - SEE COMMENT. 2. Uterus +/- tubes/ovaries, neoplastic - HIGH GRADE ENDOMETRIAL ADENOCARCINOMA INVADING THE MYOMETRIUM TO A DEPTH OF 0.8 CM IN A 1.1 CM THICK MYOMETRIUM. - SEROSAL INVOLVEMENT OF ANTERIOR PERITONEAL REFLECTION AND LEFT FALLOPIAN TUBE. - RIGHT AND LEFT OVARIES AND RIGHT FALLOPIAN TUBE NOT INVOLVED. Microscopic Comment 2. ONCOLOGY TABLE-UTERUS, CARCINOMA OR CARCINOSARCOMA Specimen: Uterus with bilateral ovaries and fallopian tubes and sigmoid lesion. Procedure: Hysterectomy with bilateral salpingo-oophorectomy and resection of sigmoid lesion. Lymph node sampling performed: No Specimen integrity: Intact. Maximum tumor size: 2.7 cm Histologic type: Endometrioid and serous. Grade: III Myometrial invasion: 0.8 cm where the myometrium is 1.1 cm in thickness Cervical stromal involvement: No Extent of involvement of other organs: Serosa of sigmoid colon, anterior uterine peritoneal reflection and left fallopian  tube. Lymph - vascular invasion: Present. Peritoneal washings: Not performed. Lymph nodes: # examined 0 ; # positive N/A TNM code: pT3a, pNX FIGO Stage (based on pathologic findings, needs clinical correlation): III Comment: The endometrial carcinoma is high grade with endometrioid and serous features and there is focal involvement of the uterine serosa anterior peritoneal reflection and the left fallopian tube serosa. In addition, the specimen submitted as sigmoid lesion consists of high grade carcinoma with prominent serous features.  RADIOGRAPHY: EXAM: CT CHEST, ABDOMEN, AND PELVIS WITH CONTRAST  TECHNIQUE: Multidetector CT imaging of the chest, abdomen and pelvis was performed following the standard protocol during bolus administration of intravenous contrast.  CONTRAST: 119m OMNIPAQUE IOHEXOL 300 MG/ML SOLN  COMPARISON: None.  FINDINGS: CT CHEST FINDINGS  Chest wall: No breast masses, supraclavicular or axillary lymphadenopathy. The thyroid gland appears normal. The bony thorax is intact.  Mediastinum: The heart is normal in size. There is a mild pectus deformity. No pericardial effusion. No mediastinal or hilar mass or adenopathy. The aorta and branch vessels are patent. The esophagus is normal.  Lungs: The lungs are clear. No acute pulmonary findings. No pulmonary nodules. No pleural effusion.  CT ABDOMEN AND PELVIS FINDINGS  Hepatobiliary: Mild diffuse fatty infiltration of the liver but no focal hepatic lesions or intrahepatic biliary dilatation. The gallbladder is normal. No common bowel duct dilatation.  Pancreas: Normal  Spleen: Normal  Adrenals/Urinary Tract: Normal  Stomach/Bowel: The stomach, duodenum, small bowel and colon are unremarkable. No inflammatory changes, mass lesions or obstructive findings. The terminal ileum is normal. The appendix is normal.  Vascular/Lymphatic: No mesenteric or retroperitoneal mass or adenopathy  the aorta and branch vessels are patent. The major venous structures are patent.  Reproductive: Markedly thickened endometrium measuring approximately 25.5 mm. There is a focal area of abnormal enhancement in the fundal region anteriorly. There is also a calcified submucosal fibroid in the lower uterine segment anteriorly. No CT findings for tumor extension outside of the uterus. The ovaries are normal. The bladder is decompressed but normal. No inguinal mass or adenopathy.  Other: No abdominal wall hernia or subcutaneous lesions.  Musculoskeletal: No worrisome bone lesions.  IMPRESSION: 1. Markedly thickened endometrium consistent with patient's known endometrial cancer. No CT findings for extension outside the uterus. The myometrium appears intact. 2. No abdominal/pelvic lymphadenopathy or metastatic disease involving the solid organs. 3. Normal CT examination the chest. No findings for metastatic disease    DISCUSSION: All of history above reviewed, including circumstances around diagnosis  and interventions done thus far. We have discussed rationale for adjuvant chemotherapy, and they are comfortable with detailed information given in chemotherapy education class. We have reviewed mechanism of action of chemotherapy, specifically carboplatin and taxol, and discussed particularly nausea and use of antiemetics, premedication steroids and expected increase in blood sugars around those doses, possible drop in blood counts, possible allergic reaction to taxol, possible taxol aches, possible peripheral neuropathy. All questions answered, verbal consent obtained. Will coordinate treatment dates with daughter's schedule as best possible. She prefers q 3 week treatments based on discussion now.  Discussed hyponatremia on today's labs as well as in hospital, likely due to chlorthalidone and hyperglycemia, suggested adding one bottle G2 gatorade daily and will follow. I will let gyn oncology  know about the increased vaginal drainage.   IMPRESSION / PLAN:  1.IIIA grade 3 serous endometrial carcinoma: residual at least miliary disease at completion of surgery, now for additional treatment with taxol and carboplatin, to begin ~ 02-16-15 with q 3 week regimen. I will see her back ~ 1 week and ~ 2 weeks after first treatment.  2.diabetes x 15 years: will check blood sugar and cover with regular insulin on sliding scale with each treatment, may need to increase home medications also on SS around steroids necessary with taxol. CC this note to PCP Dr Posey Pronto, who manages her diabetes. 3. Increased clear vaginal drainage since post op exam. Will let gyn onc know. 4.Hypertension, generally well controlled. 5.anemia: not macrocytic tho she may well be B12 deficient based on vegetarian diet; may be iron deficient based on prolonged vaginal bleeding prior to diagnosis. Will check with next MD visit labs. 6.glaucoma 7.hyponatremia, hypochloremia: likely due to diuretic, present during hospitalization for recent surgery also. I will let PCP know, add G2 gatorade as above and will have NS as IVF with chemo. Not acutely symptomatic.   Patient and accompanying individuals have had questions answered to their satisfaction and are in agreement with plan above. They can contact this office for questions or concerns at any time prior to next scheduled visit. Chemo orders entered. Message to financial staff for preauth chemo and possible neulasta  Time spent  50 min, including >50% discussion and coordination of care.    Summers Buendia P, MD 02/09/2015 8:00 PM

## 2015-02-09 NOTE — Patient Instructions (Signed)
We will send prescriptions to your pharmacy   1.decadron (dexamethasone, steroid) 4 mg. Take five tablets +(=20 mg) with food 12 hrs before taxol chemotherapy and five tablets with food 6 hrs before taxol   2.zofran (ondansetron) 8mg One tablet every 8 hrs as needed for nausea. Will not make you drowsy. Fine to take one tablet AM after chemo whether or not any nausea then, to extend coverage for nausea a bit longer. Other than that dose, fine to take just as needed for nausea   3.ativan (lorazepam) 0.5 mg. One tablet swallow or dissolve under tongue every 6 hrs as needed for nausea. WIll make you drowsy and a little forgetful around each dose. Fine to take one tablet at bedtime night of chemo whether or not any nausea.   You can call any time if needed [phone]  

## 2015-02-10 ENCOUNTER — Telehealth: Payer: Self-pay | Admitting: *Deleted

## 2015-02-10 LAB — CA 125: CA 125: 38 U/mL — AB (ref <35)

## 2015-02-10 NOTE — Telephone Encounter (Signed)
Per staff message and POF I have scheduled appts. Advised scheduler of appts. JMW  

## 2015-02-12 ENCOUNTER — Other Ambulatory Visit: Payer: Self-pay | Admitting: Oncology

## 2015-02-12 DIAGNOSIS — E871 Hypo-osmolality and hyponatremia: Secondary | ICD-10-CM | POA: Insufficient documentation

## 2015-02-12 DIAGNOSIS — D649 Anemia, unspecified: Secondary | ICD-10-CM | POA: Insufficient documentation

## 2015-02-12 DIAGNOSIS — E878 Other disorders of electrolyte and fluid balance, not elsewhere classified: Secondary | ICD-10-CM | POA: Insufficient documentation

## 2015-02-12 DIAGNOSIS — H409 Unspecified glaucoma: Secondary | ICD-10-CM | POA: Insufficient documentation

## 2015-02-15 ENCOUNTER — Telehealth: Payer: Self-pay

## 2015-02-15 NOTE — Telephone Encounter (Signed)
-----   Message from [name], MD sent at 02/09/2015  5:16 PM EST ----- Please send to pharmacy  [employer] always fine  1.zofran 8 mg:  1 q 8 hr prn nausea. Will not make drowsy  #30  1RF  2.ativan 0.5 mg:  1 SL or po q 6 hr prn nausea. Will make drowsy  #20 NRF  3.decadron 4 mg:  Five tabs with food (=20mg ) 12 hrs prior to chemo and 5 tabs with    food (=20 mg) 6 hrs prior to chemo   #10 for one treatment   RN please call patient and daughter prior to first treatment, which will be ~ 3-17, to go over meds. Note she is diabetic, so needs to push unsweetened fluids and call if BS >350 after chemo. Please review taxol aches and remind to try claritin beginning day after chemo 10 mg daily x 4-5 days.

## 2015-02-15 NOTE — Telephone Encounter (Signed)
Reviewed the times to take the decadron premed 930 pn this evening and 0330 Thursday am with food.   Reviewed taking claritin 10 mg x 5 days begingin tomorrow to help with taxol aches. Reviewed pushing unsweetened fluids as noted below and call if BS > 350 after treatment. by Dr. Marko Plume. Suggested that Brittany Schwartz take an ativan the night after chemotherapy and a Zofran table the morning after her treatment whether or not nauseous then take prn. Daughter verbalized understanding and will review with her mother.  She will be staying tomorrow night with her mother after her treatment.

## 2015-02-16 ENCOUNTER — Ambulatory Visit (HOSPITAL_BASED_OUTPATIENT_CLINIC_OR_DEPARTMENT_OTHER): Payer: 59

## 2015-02-16 ENCOUNTER — Other Ambulatory Visit (HOSPITAL_BASED_OUTPATIENT_CLINIC_OR_DEPARTMENT_OTHER): Payer: 59

## 2015-02-16 ENCOUNTER — Other Ambulatory Visit: Payer: 59

## 2015-02-16 DIAGNOSIS — C541 Malignant neoplasm of endometrium: Secondary | ICD-10-CM

## 2015-02-16 DIAGNOSIS — E119 Type 2 diabetes mellitus without complications: Secondary | ICD-10-CM

## 2015-02-16 DIAGNOSIS — Z5111 Encounter for antineoplastic chemotherapy: Secondary | ICD-10-CM

## 2015-02-16 LAB — CBC WITH DIFFERENTIAL/PLATELET
BASO%: 0.5 % (ref 0.0–2.0)
BASOS ABS: 0.1 10*3/uL (ref 0.0–0.1)
EOS%: 0 % (ref 0.0–7.0)
Eosinophils Absolute: 0 10*3/uL (ref 0.0–0.5)
HCT: 33.6 % — ABNORMAL LOW (ref 34.8–46.6)
HGB: 10.8 g/dL — ABNORMAL LOW (ref 11.6–15.9)
LYMPH%: 11.4 % — ABNORMAL LOW (ref 14.0–49.7)
MCH: 26.9 pg (ref 25.1–34.0)
MCHC: 32.2 g/dL (ref 31.5–36.0)
MCV: 83.6 fL (ref 79.5–101.0)
MONO#: 0.1 10*3/uL (ref 0.1–0.9)
MONO%: 0.5 % (ref 0.0–14.0)
NEUT%: 87.6 % — AB (ref 38.4–76.8)
NEUTROS ABS: 11.3 10*3/uL — AB (ref 1.5–6.5)
Platelets: 301 10*3/uL (ref 145–400)
RBC: 4.02 10*6/uL (ref 3.70–5.45)
RDW: 13.1 % (ref 11.2–14.5)
WBC: 12.9 10*3/uL — AB (ref 3.9–10.3)
lymph#: 1.5 10*3/uL (ref 0.9–3.3)

## 2015-02-16 LAB — COMPREHENSIVE METABOLIC PANEL (CC13)
ALBUMIN: 3.5 g/dL (ref 3.5–5.0)
ALT: 21 U/L (ref 0–55)
AST: 10 U/L (ref 5–34)
Alkaline Phosphatase: 72 U/L (ref 40–150)
Anion Gap: 14 mEq/L — ABNORMAL HIGH (ref 3–11)
BUN: 12.2 mg/dL (ref 7.0–26.0)
CHLORIDE: 94 meq/L — AB (ref 98–109)
CO2: 21 meq/L — AB (ref 22–29)
CREATININE: 0.9 mg/dL (ref 0.6–1.1)
Calcium: 9.8 mg/dL (ref 8.4–10.4)
EGFR: 65 mL/min/{1.73_m2} — ABNORMAL LOW (ref >90)
GLUCOSE: 307 mg/dL — AB (ref 70–140)
Potassium: 4.9 mEq/L (ref 3.5–5.1)
SODIUM: 128 meq/L — AB (ref 136–145)
Total Bilirubin: 0.26 mg/dL (ref 0.20–1.20)
Total Protein: 7.8 g/dL (ref 6.4–8.3)

## 2015-02-16 LAB — WHOLE BLOOD GLUCOSE: GLUCOSE: 217 mg/dL — AB (ref 70–100)

## 2015-02-16 MED ORDER — DIPHENHYDRAMINE HCL 50 MG/ML IJ SOLN
50.0000 mg | Freq: Once | INTRAMUSCULAR | Status: AC
  Administered 2015-02-16: 50 mg via INTRAVENOUS

## 2015-02-16 MED ORDER — FAMOTIDINE IN NACL 20-0.9 MG/50ML-% IV SOLN
INTRAVENOUS | Status: AC
Start: 2015-02-16 — End: 2015-02-16
  Filled 2015-02-16: qty 50

## 2015-02-16 MED ORDER — DIPHENHYDRAMINE HCL 50 MG/ML IJ SOLN
INTRAMUSCULAR | Status: AC
  Filled 2015-02-16: qty 1

## 2015-02-16 MED ORDER — PACLITAXEL CHEMO INJECTION 300 MG/50ML
175.0000 mg/m2 | Freq: Once | INTRAVENOUS | Status: AC
  Administered 2015-02-16: 276 mg via INTRAVENOUS
  Filled 2015-02-16: qty 46

## 2015-02-16 MED ORDER — SODIUM CHLORIDE 0.9 % IV SOLN
489.0000 mg | Freq: Once | INTRAVENOUS | Status: AC
  Administered 2015-02-16: 490 mg via INTRAVENOUS
  Filled 2015-02-16: qty 49

## 2015-02-16 MED ORDER — SODIUM CHLORIDE 0.9 % IV SOLN
Freq: Once | INTRAVENOUS | Status: AC
  Administered 2015-02-16: 11:00:00 via INTRAVENOUS
  Filled 2015-02-16: qty 8

## 2015-02-16 MED ORDER — SODIUM CHLORIDE 0.9 % IV SOLN
Freq: Once | INTRAVENOUS | Status: AC
  Administered 2015-02-16: 10:00:00 via INTRAVENOUS

## 2015-02-16 MED ORDER — INSULIN REGULAR HUMAN 100 UNIT/ML IJ SOLN
4.0000 [IU] | Freq: Once | INTRAMUSCULAR | Status: AC | PRN
  Administered 2015-02-16: 4 [IU] via SUBCUTANEOUS
  Filled 2015-02-16: qty 0.04

## 2015-02-16 MED ORDER — FAMOTIDINE IN NACL 20-0.9 MG/50ML-% IV SOLN
20.0000 mg | Freq: Once | INTRAVENOUS | Status: AC
  Administered 2015-02-16: 20 mg via INTRAVENOUS

## 2015-02-16 MED ORDER — INSULIN REGULAR HUMAN 100 UNIT/ML IJ SOLN
8.0000 [IU] | Freq: Once | INTRAMUSCULAR | Status: AC
  Administered 2015-02-16: 8 [IU] via SUBCUTANEOUS
  Filled 2015-02-16: qty 0.08

## 2015-02-16 NOTE — Patient Instructions (Signed)
[name] Discharge Instructions for Patients Receiving Chemotherapy  You received Taxol and Carboplatin today.  To help prevent nausea and vomiting after your treatment, we encourage you to take your nausea medication as prescribed.   If you develop nausea and vomiting that is not controlled by your nausea medication, call the clinic.   BELOW ARE SYMPTOMS THAT SHOULD BE REPORTED IMMEDIATELY:  *FEVER GREATER THAN 100.5 F  *CHILLS WITH OR WITHOUT FEVER  NAUSEA AND VOMITING THAT IS NOT CONTROLLED WITH YOUR NAUSEA MEDICATION  *UNUSUAL SHORTNESS OF BREATH  *UNUSUAL BRUISING OR BLEEDING  TENDERNESS IN MOUTH AND THROAT WITH OR WITHOUT PRESENCE OF ULCERS  *URINARY PROBLEMS  *BOWEL PROBLEMS  UNUSUAL RASH Items with * indicate a potential emergency and should be followed up as soon as possible.  Feel free to call the clinic you have any questions or concerns. The clinic phone number is [phone].  Please show the [place] at check-in to the Emergency Department and triage nurse.

## 2015-02-17 ENCOUNTER — Telehealth: Payer: Self-pay

## 2015-02-17 NOTE — Telephone Encounter (Signed)
Spoke with daughter [name].  Her mother is doing very well .  No n/v.  Drinking fluids well.  Will begin claritin today to help decrease aches from Taxol.  Told the daughter that the aches will probaly occur with in the next couple of days.  She knows to call [phone] any time if there are questions or concerns.

## 2015-02-17 NOTE — Telephone Encounter (Signed)
-----   Message from Hebert Soho, RN sent at 02/16/2015  4:15 PM EDT ----- Regarding: chemo follow up chemo First time Taxol and Carboplatin. Dr. Marko Plume. No reaction. Call patient late morning.

## 2015-02-23 ENCOUNTER — Ambulatory Visit (HOSPITAL_BASED_OUTPATIENT_CLINIC_OR_DEPARTMENT_OTHER): Payer: 59 | Admitting: Oncology

## 2015-02-23 ENCOUNTER — Telehealth: Payer: Self-pay | Admitting: Oncology

## 2015-02-23 ENCOUNTER — Other Ambulatory Visit: Payer: Self-pay | Admitting: Oncology

## 2015-02-23 ENCOUNTER — Ambulatory Visit: Payer: 59

## 2015-02-23 ENCOUNTER — Other Ambulatory Visit (HOSPITAL_BASED_OUTPATIENT_CLINIC_OR_DEPARTMENT_OTHER): Payer: 59

## 2015-02-23 ENCOUNTER — Encounter: Payer: Self-pay | Admitting: Oncology

## 2015-02-23 VITALS — BP 157/71 | HR 90 | Temp 97.6°F | Resp 18 | Ht 61.0 in | Wt 120.3 lb

## 2015-02-23 DIAGNOSIS — I10 Essential (primary) hypertension: Secondary | ICD-10-CM | POA: Diagnosis not present

## 2015-02-23 DIAGNOSIS — C541 Malignant neoplasm of endometrium: Secondary | ICD-10-CM

## 2015-02-23 DIAGNOSIS — D649 Anemia, unspecified: Secondary | ICD-10-CM

## 2015-02-23 DIAGNOSIS — E119 Type 2 diabetes mellitus without complications: Secondary | ICD-10-CM

## 2015-02-23 DIAGNOSIS — C55 Malignant neoplasm of uterus, part unspecified: Secondary | ICD-10-CM

## 2015-02-23 DIAGNOSIS — E871 Hypo-osmolality and hyponatremia: Secondary | ICD-10-CM

## 2015-02-23 DIAGNOSIS — E878 Other disorders of electrolyte and fluid balance, not elsewhere classified: Secondary | ICD-10-CM

## 2015-02-23 LAB — CBC WITH DIFFERENTIAL/PLATELET
BASO%: 0.5 % (ref 0.0–2.0)
Basophils Absolute: 0 10*3/uL (ref 0.0–0.1)
EOS%: 3.7 % (ref 0.0–7.0)
Eosinophils Absolute: 0.3 10*3/uL (ref 0.0–0.5)
HCT: 33.5 % — ABNORMAL LOW (ref 34.8–46.6)
HEMOGLOBIN: 10.9 g/dL — AB (ref 11.6–15.9)
LYMPH#: 1 10*3/uL (ref 0.9–3.3)
LYMPH%: 12.6 % — ABNORMAL LOW (ref 14.0–49.7)
MCH: 27.4 pg (ref 25.1–34.0)
MCHC: 32.7 g/dL (ref 31.5–36.0)
MCV: 83.8 fL (ref 79.5–101.0)
MONO#: 0.2 10*3/uL (ref 0.1–0.9)
MONO%: 2.3 % (ref 0.0–14.0)
NEUT#: 6.6 10*3/uL — ABNORMAL HIGH (ref 1.5–6.5)
NEUT%: 80.9 % — AB (ref 38.4–76.8)
Platelets: 236 10*3/uL (ref 145–400)
RBC: 3.99 10*6/uL (ref 3.70–5.45)
RDW: 13.1 % (ref 11.2–14.5)
WBC: 8.2 10*3/uL (ref 3.9–10.3)

## 2015-02-23 LAB — COMPREHENSIVE METABOLIC PANEL (CC13)
ALBUMIN: 3.7 g/dL (ref 3.5–5.0)
ALK PHOS: 63 U/L (ref 40–150)
ALT: 25 U/L (ref 0–55)
AST: 17 U/L (ref 5–34)
Anion Gap: 12 mEq/L — ABNORMAL HIGH (ref 3–11)
BUN: 14.9 mg/dL (ref 7.0–26.0)
CHLORIDE: 97 meq/L — AB (ref 98–109)
CO2: 20 mEq/L — ABNORMAL LOW (ref 22–29)
Calcium: 9 mg/dL (ref 8.4–10.4)
Creatinine: 0.8 mg/dL (ref 0.6–1.1)
EGFR: 77 mL/min/{1.73_m2} — AB (ref >90)
GLUCOSE: 168 mg/dL — AB (ref 70–140)
POTASSIUM: 4.8 meq/L (ref 3.5–5.1)
SODIUM: 130 meq/L — AB (ref 136–145)
TOTAL PROTEIN: 7.2 g/dL (ref 6.4–8.3)
Total Bilirubin: 0.49 mg/dL (ref 0.20–1.20)

## 2015-02-23 LAB — IRON AND TIBC CHCC
%SAT: 26 % (ref 21–57)
IRON: 84 ug/dL (ref 41–142)
TIBC: 323 ug/dL (ref 236–444)
UIBC: 238 ug/dL (ref 120–384)

## 2015-02-23 LAB — VITAMIN B12: Vitamin B-12: 351 pg/mL (ref 211–911)

## 2015-02-23 MED ORDER — PANTOPRAZOLE SODIUM 40 MG PO TBEC: 40.0000 mg | DELAYED_RELEASE_TABLET | Freq: Every day | ORAL | Status: DC

## 2015-02-23 MED ORDER — DEXAMETHASONE 4 MG PO TABS: ORAL_TABLET | ORAL | Status: DC

## 2015-02-23 NOTE — Patient Instructions (Addendum)
We will send prescription for Protonix to your pharmacy, one daily for acid reflux.  We will also send prescription for more of the steroid medicine that you take before each chemo. There will be enough in this bottle for the next 2 treatments.   Call if questions or concerns [phone]

## 2015-02-23 NOTE — Telephone Encounter (Signed)
per pof ot sch pt appt-gave pt copy of sch °

## 2015-02-23 NOTE — Progress Notes (Signed)
OFFICE PROGRESS NOTE   February 23, 2015   Physicians:[name]/ Laurine Blazer, Providence Crosby, MD (PCP, [facility]), Aloha Gell  INTERVAL HISTORY:  Patient is seen, together with husband, now having begun adjuvant carboplatin taxol on 02-16-15 for IIIA grade 3 serous endometrial carcinoma. She is on q 3 week regimen, planned for 6 cycles.  Patient had no problems with treatment itself, including peripheral IV access. She has had some taste changes, but no significant N/V. She had aches mostly days 3 and 4, will start Claritin day 2 with next cycle. Blood sugars have been 140 -170 fasting since the chemo. She has had more reflux, which she had intermittently prior to this illness and will start H2 blocker as prescription. She has no peripheral neuropathy. Bowels are moving more regularly with miralax. She has less vaginal discharge and no bleeding.   No PAC Did not have flu vaccine  ONCOLOGIC HISTORY Patient presented to Dr [name] 01-03-15 with vaginal spotting x 1 year. Pelvic US showed 33 mm endometrial stripe with ovaries normal, and endometrial biopsy also 01-03-15 reportedly had grade 3 endometrial adenocarcinoma. She was seen in consultation by Dr Denman George on 2-8-1. CT CAP 01-13-15 had negative chest, no adenopathy, mild fatty liver, markedly thickened endometrium and no evidence of metastatic disease outside of uterus. CA 125 preoperatively on 01-13-15 was 22.9. Surgery by Dr [name] 01-19-15 was robotic hysterectomy, BSO and resection of nodule at sigmoid serosa. At surgery there was milialtumor studding with 5-10 mm implants on bladder peritoneum and miliary disease on pelvic sidewalls, without upper abdominal disease evident, and omentum appeared normal. Pathology ([id number]) found IIIA grade 3 serous/ endometrioid endometrial carcinoma with 8 mm / 12 mm myometrial invasion (60%), + LVSI, and extensive serous involvement of sigmoid nodule. She was seen for post operative follow up by Dr Denman George on 02-03-15,  with recommendation for 6 cycles of taxol carboplatin, then consideration of external beam RT + vaginal brachytherapy if CR. First carboplatin taxol was given 02-16-2015.   Review of systems as above, also: No fever or symptoms of infection. No abdominal pain. No SOB or cough. Not losing hair yet. No mucositis. Remainder of 10 point Review of Systems negative.  Objective:  Vital signs in last 24 hours:  BP 157/71 mmHg  Pulse 90  Temp(Src) 97.6 F (36.4 C) (Oral)  Resp 18  Ht 5' 1"  (1.549 m)  Wt 120 lb 4.8 oz (54.568 kg)  BMI 22.74 kg/m2 Weight is down 5 lbs.  Alert, oriented and appropriate. Ambulatory without difficulty. Looks comfortable, very pleasant, husband supportive.  Respirations not labored RA No alopecia  HEENT:PERRL, sclerae not icteric. Oral mucosa moist without lesions, posterior pharynx clear.  Neck supple. No JVD.  Lymphatics:no cervical,supraclavicular or inguinal adenopathy Resp: clear to auscultation bilaterally and normal percussion bilaterally Cardio: regular rate and rhythm. No gallop. GI: soft, nontender including epigastrium, not distended, no mass or organomegaly. Some bowel sounds. Surgical incisions healed.  Musculoskeletal/ Extremities: without pitting edema, cords, tenderness Neuro: no peripheral neuropathy. Otherwise nonfocal. PSYCH appropriate mood and affect Skin without rash, ecchymosis, petechiae   Lab Results:  Results for orders placed or performed in visit on 02/23/15  CBC with Differential  Result Value Ref Range   WBC 8.2 3.9 - 10.3 10e3/uL   NEUT# 6.6 (H) 1.5 - 6.5 10e3/uL   HGB 10.9 (L) 11.6 - 15.9 g/dL   HCT 33.5 (L) 34.8 - 46.6 %   Platelets 236 145 - 400 10e3/uL   MCV 83.8 79.5 - 101.0  fL   MCH 27.4 25.1 - 34.0 pg   MCHC 32.7 31.5 - 36.0 g/dL   RBC 3.99 3.70 - 5.45 10e6/uL   RDW 13.1 11.2 - 14.5 %   lymph# 1.0 0.9 - 3.3 10e3/uL   MONO# 0.2 0.1 - 0.9 10e3/uL   Eosinophils Absolute 0.3 0.0 - 0.5 10e3/uL   Basophils Absolute  0.0 0.0 - 0.1 10e3/uL   NEUT% 80.9 (H) 38.4 - 76.8 %   LYMPH% 12.6 (L) 14.0 - 49.7 %   MONO% 2.3 0.0 - 14.0 %   EOS% 3.7 0.0 - 7.0 %   BASO% 0.5 0.0 - 2.0 %  Comprehensive metabolic panel (Cmet) - [facility]  Result Value Ref Range   Sodium 130 (L) 136 - 145 mEq/L   Potassium 4.8 3.5 - 5.1 mEq/L   Chloride 97 (L) 98 - 109 mEq/L   CO2 20 (L) 22 - 29 mEq/L   Glucose 168 (H) 70 - 140 mg/dl   BUN 14.9 7.0 - 26.0 mg/dL   Creatinine 0.8 0.6 - 1.1 mg/dL   Total Bilirubin 0.49 0.20 - 1.20 mg/dL   Alkaline Phosphatase 63 40 - 150 U/L   AST 17 5 - 34 U/L   ALT 25 0 - 55 U/L   Total Protein 7.2 6.4 - 8.3 g/dL   Albumin 3.7 3.5 - 5.0 g/dL   Calcium 9.0 8.4 - 10.4 mg/dL   Anion Gap 12 (H) 3 - 11 mEq/L   EGFR 77 (L) >90 ml/min/1.73 m2  Iron and TIBC [facility]  Result Value Ref Range   Iron 84 41 - 142 ug/dL   TIBC 323 236 - 444 ug/dL   UIBC 238 120 - 384 ug/dL   %SAT 26 21 - 57 %    B12 available after visit 351, this as she is lifelong vegetarian.  Studies/Results:  No results found.  Medications: I have reviewed the patient's current medications. Will begin Protonix or equivalent 40 mg daily. Refill decadron.  DISCUSSION: symptoms related to chemo discussed. Needs to increase po fluids. Counts likely not at nadir, but all ok now and will recheck with visit next week. She is to call prior to scheduled visit if extremely fatigued or other problems. Medications discussed.  Assessment/Plan:  1.IIIA grade 3 serous endometrial carcinoma: residual at least miliary disease at completion of surgery, now for additional treatment with taxol and carboplatin, to begin ~ 02-16-15 with q 3 week regimen. I will see her with labs again next week. 2.diabetes x 15 years: will check blood sugar and cover with regular insulin on sliding scale with each treatment. Home blood sugars reasonable from my standpoint now. PCP manages diabetes. 3. Vaginal drainage imrpoved 4.Hypertension, generally well controlled, on  tenoretic (see below) 5.anemia: B12 ok despite vegetarian diet, iron ok. Will follow 6.glaucoma 7.hyponatremia, hypochloremia: likely due to diuretic, present during hospitalization for recent surgery also. A little better today, I believe now drinking G2 gatorade. This information sent to PCP at initial consult. Not symptomatic. 8.reflux symptoms interfering with po intake: add protonix. Discussed diet.  All questions answered. Patient and husband understand discussion and are in agreement with plans above. Time spent 25 min including >50% counseling and coordination of care.     LIVESAY,LENNIS P, MD   02/23/2015, 12:19 PM

## 2015-02-24 DIAGNOSIS — D649 Anemia, unspecified: Secondary | ICD-10-CM | POA: Insufficient documentation

## 2015-02-24 DIAGNOSIS — E119 Type 2 diabetes mellitus without complications: Secondary | ICD-10-CM | POA: Insufficient documentation

## 2015-02-24 DIAGNOSIS — I10 Essential (primary) hypertension: Secondary | ICD-10-CM | POA: Insufficient documentation

## 2015-03-01 ENCOUNTER — Other Ambulatory Visit: Payer: Self-pay | Admitting: Oncology

## 2015-03-01 DIAGNOSIS — C541 Malignant neoplasm of endometrium: Secondary | ICD-10-CM

## 2015-03-02 ENCOUNTER — Other Ambulatory Visit: Payer: 59

## 2015-03-02 ENCOUNTER — Telehealth: Payer: Self-pay | Admitting: Oncology

## 2015-03-02 ENCOUNTER — Encounter: Payer: Self-pay | Admitting: Oncology

## 2015-03-02 ENCOUNTER — Ambulatory Visit (HOSPITAL_BASED_OUTPATIENT_CLINIC_OR_DEPARTMENT_OTHER): Payer: 59 | Admitting: Oncology

## 2015-03-02 ENCOUNTER — Other Ambulatory Visit (HOSPITAL_BASED_OUTPATIENT_CLINIC_OR_DEPARTMENT_OTHER): Payer: 59

## 2015-03-02 VITALS — BP 170/71 | HR 85 | Temp 98.0°F | Resp 18 | Ht 61.0 in | Wt 125.4 lb

## 2015-03-02 DIAGNOSIS — C55 Malignant neoplasm of uterus, part unspecified: Secondary | ICD-10-CM

## 2015-03-02 DIAGNOSIS — E119 Type 2 diabetes mellitus without complications: Secondary | ICD-10-CM | POA: Diagnosis not present

## 2015-03-02 DIAGNOSIS — D649 Anemia, unspecified: Secondary | ICD-10-CM | POA: Diagnosis not present

## 2015-03-02 DIAGNOSIS — C541 Malignant neoplasm of endometrium: Secondary | ICD-10-CM | POA: Diagnosis not present

## 2015-03-02 DIAGNOSIS — D701 Agranulocytosis secondary to cancer chemotherapy: Secondary | ICD-10-CM | POA: Diagnosis not present

## 2015-03-02 DIAGNOSIS — E871 Hypo-osmolality and hyponatremia: Secondary | ICD-10-CM

## 2015-03-02 DIAGNOSIS — E878 Other disorders of electrolyte and fluid balance, not elsewhere classified: Secondary | ICD-10-CM

## 2015-03-02 DIAGNOSIS — T451X5A Adverse effect of antineoplastic and immunosuppressive drugs, initial encounter: Secondary | ICD-10-CM

## 2015-03-02 DIAGNOSIS — I10 Essential (primary) hypertension: Secondary | ICD-10-CM

## 2015-03-02 LAB — BASIC METABOLIC PANEL (CC13)
ANION GAP: 11 meq/L (ref 3–11)
BUN: 6.3 mg/dL — ABNORMAL LOW (ref 7.0–26.0)
CO2: 23 mEq/L (ref 22–29)
Calcium: 8.9 mg/dL (ref 8.4–10.4)
Chloride: 104 mEq/L (ref 98–109)
Creatinine: 0.8 mg/dL (ref 0.6–1.1)
EGFR: 83 mL/min/{1.73_m2} — AB (ref >90)
Glucose: 203 mg/dl — ABNORMAL HIGH (ref 70–140)
POTASSIUM: 4.7 meq/L (ref 3.5–5.1)
Sodium: 139 mEq/L (ref 136–145)

## 2015-03-02 LAB — CBC WITH DIFFERENTIAL/PLATELET
BASO%: 1.2 % (ref 0.0–2.0)
Basophils Absolute: 0 10*3/uL (ref 0.0–0.1)
EOS ABS: 0.2 10*3/uL (ref 0.0–0.5)
EOS%: 4.5 % (ref 0.0–7.0)
HCT: 27.4 % — ABNORMAL LOW (ref 34.8–46.6)
HGB: 9.2 g/dL — ABNORMAL LOW (ref 11.6–15.9)
LYMPH%: 55.5 % — AB (ref 14.0–49.7)
MCH: 28 pg (ref 25.1–34.0)
MCHC: 33.6 g/dL (ref 31.5–36.0)
MCV: 83.5 fL (ref 79.5–101.0)
MONO#: 0.6 10*3/uL (ref 0.1–0.9)
MONO%: 17 % — AB (ref 0.0–14.0)
NEUT#: 0.7 10*3/uL — ABNORMAL LOW (ref 1.5–6.5)
NEUT%: 21.8 % — ABNORMAL LOW (ref 38.4–76.8)
PLATELETS: 135 10*3/uL — AB (ref 145–400)
RBC: 3.28 10*6/uL — AB (ref 3.70–5.45)
RDW: 14.3 % (ref 11.2–14.5)
WBC: 3.4 10*3/uL — ABNORMAL LOW (ref 3.9–10.3)
lymph#: 1.9 10*3/uL (ref 0.9–3.3)

## 2015-03-02 MED ORDER — TBO-FILGRASTIM 300 MCG/0.5ML ~~LOC~~ SOSY
300.0000 ug | PREFILLED_SYRINGE | Freq: Once | SUBCUTANEOUS | Status: AC
  Administered 2015-03-02: 300 ug via SUBCUTANEOUS
  Filled 2015-03-02: qty 0.5

## 2015-03-02 NOTE — Telephone Encounter (Signed)
per pof to sch pt appt-gave pt copy of sch °

## 2015-03-02 NOTE — Progress Notes (Signed)
OFFICE PROGRESS NOTE   March 02, 2015   Physicians:[name]/ Laurine Blazer, Providence Crosby, MD (PCP, [facility]), Aloha Gell  INTERVAL HISTORY:  Patient is seen in continuing attention to adjuvant chemotherapy in process for IIIA grade 3 serous endometrial carcinoma, having had cycle 1 carbo taxol on 02-16-15. She is neutropenic today, tho not febrile, and will begin granix.  Patient had aches from taxol days 3-4 which have completely resolved; she will begin Claritin at least day after chemo for upcoming cycle. GERD is not bothering her now, tho I am not certain if she began Protonix. Bowels are moving well, appetite improved. Blood sugars have been in stable, reasonable range. She has no significant peripheral neuropathy. She does not feel fatigued even with neutropenia. No symptoms of infection.    No PAC Did not have flu vaccine  ONCOLOGIC HISTORY Patient presented to Dr [name] 01-03-15 with vaginal spotting x 1 year. Pelvic US showed 33 mm endometrial stripe with ovaries normal, and endometrial biopsy also 01-03-15 reportedly had grade 3 endometrial adenocarcinoma. She was seen in consultation by Dr Denman George on 2-8-1. CT CAP 01-13-15 had negative chest, no adenopathy, mild fatty liver, markedly thickened endometrium and no evidence of metastatic disease outside of uterus. CA 125 preoperatively on 01-13-15 was 22.9. Surgery by Dr [name] 01-19-15 was robotic hysterectomy, BSO and resection of nodule at sigmoid serosa. At surgery there was milialtumor studding with 5-10 mm implants on bladder peritoneum and miliary disease on pelvic sidewalls, without upper abdominal disease evident, and omentum appeared normal. Pathology ([id number]) found IIIA grade 3 serous/ endometrioid endometrial carcinoma with 8 mm / 12 mm myometrial invasion (60%), + LVSI, and extensive serous involvement of sigmoid nodule. She was seen for post operative follow up by Dr Denman George on 02-03-15, with recommendation for 6 cycles of taxol  carboplatin, then consideration of external beam RT + vaginal brachytherapy if CR. First carboplatin taxol was given 02-16-2015; she was neutropenic with ANC 0.7 by day 15 cycle 1.    Review of systems as above, also: No bleeding. No respiratory, other GI or bladder symptoms. No problems at sites of IV access. Remainder of 10 point Review of Systems negative.  Objective:  Vital signs in last 24 hours:  BP 170/71 mmHg  Pulse 85  Temp(Src) 98 F (36.7 C) (Oral)  Resp 18  Ht 5\' 1"  (1.549 m)  Wt 125 lb 6.4 oz (56.881 kg)  BMI 23.71 kg/m2 Weight up 5 lbs. Alert, oriented and appropriate. Ambulatory without difficulty, looks entirely comfortable No alopecia  HEENT:PERRL, sclerae not icteric. Oral mucosa moist without lesions, posterior pharynx clear.  Neck supple. No JVD.  Lymphatics:no cervical,supraclavicular or inguinal adenopathy Resp: clear to auscultation bilaterally and normal percussion bilaterally. Respirations not labored RA Cardio: regular rate and rhythm. No gallop. GI: soft, nontender, not distended, no mass or organomegaly. Normally active bowel sounds. Surgical incisions not remarkable. Musculoskeletal/ Extremities: without pitting edema, cords, tenderness Neuro: no peripheral neuropathy. Otherwise nonfocal. PSYCH appropriate mood and affect Skin without rash, ecchymosis, petechiae   Lab Results:  Results for orders placed or performed in visit on 03/02/15  CBC with Differential  Result Value Ref Range   WBC 3.4 (L) 3.9 - 10.3 10e3/uL   NEUT# 0.7 (L) 1.5 - 6.5 10e3/uL   HGB 9.2 (L) 11.6 - 15.9 g/dL   HCT 27.4 (L) 34.8 - 46.6 %   Platelets 135 (L) 145 - 400 10e3/uL   MCV 83.5 79.5 - 101.0 fL   MCH 28.0 25.1 - 34.0  pg   MCHC 33.6 31.5 - 36.0 g/dL   RBC 3.28 (L) 3.70 - 5.45 10e6/uL   RDW 14.3 11.2 - 14.5 %   lymph# 1.9 0.9 - 3.3 10e3/uL   MONO# 0.6 0.1 - 0.9 10e3/uL   Eosinophils Absolute 0.2 0.0 - 0.5 10e3/uL   Basophils Absolute 0.0 0.0 - 0.1 10e3/uL    NEUT% 21.8 (L) 38.4 - 76.8 %   LYMPH% 55.5 (H) 14.0 - 49.7 %   MONO% 17.0 (H) 0.0 - 14.0 %   EOS% 4.5 0.0 - 7.0 %   BASO% 1.2 0.0 - 2.0 %     Studies/Results:  No results found.  Medications: I have reviewed the patient's current medications. Recommended that she take Claritin today and next few days for possible granix aches.  [facility] financial staff confirmed ok for granix prior to injection today, per direct request by MD.  DISCUSSION: Chemotherapy neutropenia explained, and both MD and RN have gone over neutropenic precautions. Will begin granix today, and will recheck CBC tomorrow, with granix also tomorrow (03-03-15) if [place] <=1.2. If ANC <= 1.0 on 03-03-15, also will need granix on 03-04-15. Should confirm counts ok on 4-6 piror to treatment on 4-7, as would not want her to take decadron with underlying DM if counts not adequate for 4-7 treatment.  Assessment/Plan:  1.IIIA grade 3 serous endometrial carcinoma: residual at least miliary disease at completion of surgery, first carbo taxol 02-16-15, now neutropenic. Add gCSF now and will need this after each subsequent treatment. Labs as above, RN to let her know CBC results on 4-6 and review timing for premed decadron then. 2.diabetes x 15 years: will check blood sugar and cover with regular insulin on sliding scale with each treatment. Home blood sugars reasonable from my standpoint now. PCP manages diabetes. 3. Vaginal drainage improved 4.Hypertension, generally well controlled, on tenoretic (see below) 5.anemia: B12 ok despite vegetarian diet, iron ok. Will follow 6.glaucoma 7.hyponatremia, hypochloremia: likely due to diuretic, present during hospitalization for recent surgery also. A little better today, I believe now drinking G2 gatorade. This information sent to PCP at initial consult. Not symptomatic. 8.GERD: protonix prescribed at last visit, not entirely clear to me if she is taking this now, however reflux apparently improved.   Time  spent 30 min including >50% counseling and coordination of care. All questions answered. Patient understands to call at any time if temp >=100.5, symptoms of infection or other concerns. Granix orders entered with parameters, lab orders entered. Communication with desk and injection RNs. Chemo orders for cycle 2 completed, carbo AUC =6 to be confirmed by pharmacy based on chemistries and weight closest to treatment, taxol 175 mg/m2. Will give granix 4-14 and 4-15, then see MD with labs 4-18.  LIVESAY,LENNIS P, MD   03/02/2015, 1:49 PM

## 2015-03-02 NOTE — Patient Instructions (Signed)
Your good infection fighting white blood cells are low today because of the chemotherapy, with absolute neutrophil count 0.6 (neutropenic). We have given you a granix shot today to help the white blood cells. We will check your blood counts again on 03-03-15 and will give you another shot then (and possibly on 03-04-15) if the count is still low. The shots sometimes make you ache - Claritin can help, so fine to take that today and next couple of days. Tylenol or ibuprofen with food also fine, or heating pad.  While the white blood cells are in low neutropenic range, you are more susceptible to infection. You need to call the [facility] if you have fever >=100.5 or if symptoms of infection, phone [phone]. You need to stay away from anyone who might be sick and to wash hands frequently.   We will also check your blood counts the day before chemo next week, to be sure they are high enough for treatment on 03-09-15. We will give shots for white count closer to chemo for subsequent treatments.

## 2015-03-03 ENCOUNTER — Ambulatory Visit: Payer: 59

## 2015-03-03 ENCOUNTER — Other Ambulatory Visit (HOSPITAL_BASED_OUTPATIENT_CLINIC_OR_DEPARTMENT_OTHER): Payer: 59

## 2015-03-03 DIAGNOSIS — D701 Agranulocytosis secondary to cancer chemotherapy: Secondary | ICD-10-CM

## 2015-03-03 DIAGNOSIS — C541 Malignant neoplasm of endometrium: Secondary | ICD-10-CM

## 2015-03-03 DIAGNOSIS — T451X5A Adverse effect of antineoplastic and immunosuppressive drugs, initial encounter: Secondary | ICD-10-CM

## 2015-03-03 LAB — CBC WITH DIFFERENTIAL/PLATELET
BASO%: 0.6 % (ref 0.0–2.0)
Basophils Absolute: 0.1 10*3/uL (ref 0.0–0.1)
EOS ABS: 0.3 10*3/uL (ref 0.0–0.5)
EOS%: 2.2 % (ref 0.0–7.0)
HEMATOCRIT: 27.8 % — AB (ref 34.8–46.6)
HGB: 9 g/dL — ABNORMAL LOW (ref 11.6–15.9)
LYMPH%: 20.7 % (ref 14.0–49.7)
MCH: 27.5 pg (ref 25.1–34.0)
MCHC: 32.4 g/dL (ref 31.5–36.0)
MCV: 84.7 fL (ref 79.5–101.0)
MONO#: 1.5 10*3/uL — AB (ref 0.1–0.9)
MONO%: 11.2 % (ref 0.0–14.0)
NEUT#: 8.5 10*3/uL — ABNORMAL HIGH (ref 1.5–6.5)
NEUT%: 65.3 % (ref 38.4–76.8)
Platelets: 136 10*3/uL — ABNORMAL LOW (ref 145–400)
RBC: 3.28 10*6/uL — AB (ref 3.70–5.45)
RDW: 13.7 % (ref 11.2–14.5)
WBC: 13.1 10*3/uL — AB (ref 3.9–10.3)
lymph#: 2.7 10*3/uL (ref 0.9–3.3)

## 2015-03-03 NOTE — Progress Notes (Signed)
Ms. Burruel here for lab and possible injection.   ANC 8.5  Does not need Granix injection today.  Will return on 03/08/15 for recheck of labs as scheduled.

## 2015-03-05 ENCOUNTER — Other Ambulatory Visit: Payer: Self-pay | Admitting: Oncology

## 2015-03-05 DIAGNOSIS — D701 Agranulocytosis secondary to cancer chemotherapy: Secondary | ICD-10-CM | POA: Insufficient documentation

## 2015-03-05 DIAGNOSIS — T451X5A Adverse effect of antineoplastic and immunosuppressive drugs, initial encounter: Secondary | ICD-10-CM

## 2015-03-08 ENCOUNTER — Telehealth: Payer: Self-pay

## 2015-03-08 ENCOUNTER — Other Ambulatory Visit (HOSPITAL_BASED_OUTPATIENT_CLINIC_OR_DEPARTMENT_OTHER): Payer: 59

## 2015-03-08 DIAGNOSIS — T451X5A Adverse effect of antineoplastic and immunosuppressive drugs, initial encounter: Secondary | ICD-10-CM

## 2015-03-08 DIAGNOSIS — D701 Agranulocytosis secondary to cancer chemotherapy: Secondary | ICD-10-CM

## 2015-03-08 DIAGNOSIS — C541 Malignant neoplasm of endometrium: Secondary | ICD-10-CM | POA: Diagnosis not present

## 2015-03-08 LAB — CBC WITH DIFFERENTIAL/PLATELET
BASO%: 0.8 % (ref 0.0–2.0)
Basophils Absolute: 0.1 10*3/uL (ref 0.0–0.1)
EOS%: 1.2 % (ref 0.0–7.0)
Eosinophils Absolute: 0.1 10*3/uL (ref 0.0–0.5)
HEMATOCRIT: 32.3 % — AB (ref 34.8–46.6)
HEMOGLOBIN: 10.5 g/dL — AB (ref 11.6–15.9)
LYMPH#: 2.3 10*3/uL (ref 0.9–3.3)
LYMPH%: 34.9 % (ref 14.0–49.7)
MCH: 27.2 pg (ref 25.1–34.0)
MCHC: 32.5 g/dL (ref 31.5–36.0)
MCV: 83.6 fL (ref 79.5–101.0)
MONO#: 0.9 10*3/uL (ref 0.1–0.9)
MONO%: 13.4 % (ref 0.0–14.0)
NEUT#: 3.3 10*3/uL (ref 1.5–6.5)
NEUT%: 49.7 % (ref 38.4–76.8)
Platelets: 147 10*3/uL (ref 145–400)
RBC: 3.86 10*6/uL (ref 3.70–5.45)
RDW: 14.7 % — ABNORMAL HIGH (ref 11.2–14.5)
WBC: 6.7 10*3/uL (ref 3.9–10.3)

## 2015-03-08 LAB — COMPREHENSIVE METABOLIC PANEL (CC13)
ALBUMIN: 3.9 g/dL (ref 3.5–5.0)
ALK PHOS: 84 U/L (ref 40–150)
ALT: 19 U/L (ref 0–55)
ANION GAP: 11 meq/L (ref 3–11)
AST: 16 U/L (ref 5–34)
BUN: 9.3 mg/dL (ref 7.0–26.0)
CHLORIDE: 100 meq/L (ref 98–109)
CO2: 24 mEq/L (ref 22–29)
Calcium: 9.9 mg/dL (ref 8.4–10.4)
Creatinine: 0.8 mg/dL (ref 0.6–1.1)
EGFR: 83 mL/min/{1.73_m2} — AB (ref >90)
Glucose: 183 mg/dl — ABNORMAL HIGH (ref 70–140)
Potassium: 4.6 mEq/L (ref 3.5–5.1)
SODIUM: 136 meq/L (ref 136–145)
Total Bilirubin: 0.27 mg/dL (ref 0.20–1.20)
Total Protein: 7.3 g/dL (ref 6.4–8.3)

## 2015-03-08 NOTE — Telephone Encounter (Signed)
-----   Message from [name], MD sent at 03/05/2015 10:40 AM EDT ----- For CBC CMET on 03-07-16. Need to let her know if counts ok to treat 4-7 ([place] >=1.5 and plt >=100k).  Remind her of times for premed decadron Remind her to begin claritin day or chemo or day after chemo (taxol aches days 3-4 with cycle 1) Let her know that we will give granix on 4-14 and 4-15, needed because she was neutropenic by day 15 cycle 1 and this timing will be after taxol aches resolved. POF done for granix, as those injections were not on last scheduling. (She still needs lab on day of chemo 4-7 for blood sugar)  thanks

## 2015-03-08 NOTE — Telephone Encounter (Signed)
Spoke with daughter [name] and told her that her mother's ANC= 3.3 and Plt count = 147 and was fine for treatment tomorrow. Reviewed time for decadron premed - 9 pm tonight and 0300 tomorrow 03-09-15. Reviewed dates and times for  for granix injection.  Daughter did view on my chart during this conversation. Reviewed taking Claritin and need for labs work tomorrow as noted below.  Sonal verbalized understandingand will relay this information to her mother.

## 2015-03-09 ENCOUNTER — Other Ambulatory Visit: Payer: 59

## 2015-03-09 ENCOUNTER — Other Ambulatory Visit: Payer: 59 | Admitting: Lab

## 2015-03-09 ENCOUNTER — Ambulatory Visit (HOSPITAL_BASED_OUTPATIENT_CLINIC_OR_DEPARTMENT_OTHER): Payer: 59

## 2015-03-09 ENCOUNTER — Other Ambulatory Visit (HOSPITAL_BASED_OUTPATIENT_CLINIC_OR_DEPARTMENT_OTHER): Payer: 59

## 2015-03-09 VITALS — BP 159/78 | HR 118 | Temp 98.1°F

## 2015-03-09 DIAGNOSIS — C541 Malignant neoplasm of endometrium: Secondary | ICD-10-CM

## 2015-03-09 DIAGNOSIS — E119 Type 2 diabetes mellitus without complications: Secondary | ICD-10-CM

## 2015-03-09 DIAGNOSIS — Z5111 Encounter for antineoplastic chemotherapy: Secondary | ICD-10-CM

## 2015-03-09 LAB — WHOLE BLOOD GLUCOSE
GLUCOSE: 291 mg/dL — AB (ref 70–100)
GLUCOSE: 202 mg/dL — AB (ref 70–100)
HRS PC: 2 Hours
HRS PC: 2 Hours

## 2015-03-09 MED ORDER — SODIUM CHLORIDE 0.9 % IV SOLN
Freq: Once | INTRAVENOUS | Status: AC
  Administered 2015-03-09: 10:00:00 via INTRAVENOUS
  Filled 2015-03-09: qty 8

## 2015-03-09 MED ORDER — FAMOTIDINE IN NACL 20-0.9 MG/50ML-% IV SOLN
20.0000 mg | Freq: Once | INTRAVENOUS | Status: AC
  Administered 2015-03-09: 20 mg via INTRAVENOUS

## 2015-03-09 MED ORDER — DIPHENHYDRAMINE HCL 50 MG/ML IJ SOLN
50.0000 mg | Freq: Once | INTRAMUSCULAR | Status: AC
  Administered 2015-03-09: 50 mg via INTRAVENOUS

## 2015-03-09 MED ORDER — PACLITAXEL CHEMO INJECTION 300 MG/50ML
175.0000 mg/m2 | Freq: Once | INTRAVENOUS | Status: AC
  Administered 2015-03-09: 276 mg via INTRAVENOUS
  Filled 2015-03-09: qty 46

## 2015-03-09 MED ORDER — DIPHENHYDRAMINE HCL 50 MG/ML IJ SOLN
INTRAMUSCULAR | Status: AC
  Filled 2015-03-09: qty 1

## 2015-03-09 MED ORDER — HEPARIN SOD (PORK) LOCK FLUSH 100 UNIT/ML IV SOLN
500.0000 [IU] | Freq: Once | INTRAVENOUS | Status: DC | PRN
  Filled 2015-03-09: qty 5

## 2015-03-09 MED ORDER — SODIUM CHLORIDE 0.9 % IV SOLN
531.6000 mg | Freq: Once | INTRAVENOUS | Status: AC
  Administered 2015-03-09: 530 mg via INTRAVENOUS
  Filled 2015-03-09: qty 53

## 2015-03-09 MED ORDER — FAMOTIDINE IN NACL 20-0.9 MG/50ML-% IV SOLN
INTRAVENOUS | Status: AC
Start: 2015-03-09 — End: 2015-03-09
  Filled 2015-03-09: qty 50

## 2015-03-09 MED ORDER — INSULIN REGULAR HUMAN 100 UNIT/ML IJ SOLN
4.0000 [IU] | Freq: Once | INTRAMUSCULAR | Status: AC
  Administered 2015-03-09: 4 [IU] via SUBCUTANEOUS
  Filled 2015-03-09: qty 0.04

## 2015-03-09 MED ORDER — INSULIN REGULAR HUMAN 100 UNIT/ML IJ SOLN
4.0000 [IU] | Freq: Once | INTRAMUSCULAR | Status: AC | PRN
  Administered 2015-03-09: 4 [IU] via SUBCUTANEOUS
  Filled 2015-03-09: qty 0.04

## 2015-03-09 MED ORDER — SODIUM CHLORIDE 0.9 % IJ SOLN
10.0000 mL | INTRAMUSCULAR | Status: DC | PRN
  Filled 2015-03-09: qty 10

## 2015-03-09 MED ORDER — SODIUM CHLORIDE 0.9 % IV SOLN
Freq: Once | INTRAVENOUS | Status: AC
  Administered 2015-03-09: 09:00:00 via INTRAVENOUS

## 2015-03-09 NOTE — Patient Instructions (Signed)
[facility] Discharge Instructions for Patients Receiving Chemotherapy  You received Taxol and Carboplatin today.  To help prevent nausea and vomiting after your treatment, we encourage you to take your nausea medication as prescribed.   If you develop nausea and vomiting that is not controlled by your nausea medication, call the clinic.   BELOW ARE SYMPTOMS THAT SHOULD BE REPORTED IMMEDIATELY:  *FEVER GREATER THAN 100.5 F  *CHILLS WITH OR WITHOUT FEVER  NAUSEA AND VOMITING THAT IS NOT CONTROLLED WITH YOUR NAUSEA MEDICATION  *UNUSUAL SHORTNESS OF BREATH  *UNUSUAL BRUISING OR BLEEDING  TENDERNESS IN MOUTH AND THROAT WITH OR WITHOUT PRESENCE OF ULCERS  *URINARY PROBLEMS  *BOWEL PROBLEMS  UNUSUAL RASH Items with * indicate a potential emergency and should be followed up as soon as possible.  Feel free to call the clinic you have any questions or concerns. The clinic phone number is [phone].  Please show the [facility] at check-in to the Emergency Department and triage nurse.

## 2015-03-16 ENCOUNTER — Ambulatory Visit (HOSPITAL_BASED_OUTPATIENT_CLINIC_OR_DEPARTMENT_OTHER): Payer: 59

## 2015-03-16 VITALS — BP 129/71 | HR 74 | Temp 98.1°F

## 2015-03-16 DIAGNOSIS — D709 Neutropenia, unspecified: Secondary | ICD-10-CM | POA: Diagnosis not present

## 2015-03-16 DIAGNOSIS — C541 Malignant neoplasm of endometrium: Secondary | ICD-10-CM

## 2015-03-16 MED ORDER — TBO-FILGRASTIM 300 MCG/0.5ML ~~LOC~~ SOSY
300.0000 ug | PREFILLED_SYRINGE | Freq: Once | SUBCUTANEOUS | Status: AC
  Administered 2015-03-16: 300 ug via SUBCUTANEOUS
  Filled 2015-03-16: qty 0.5

## 2015-03-17 ENCOUNTER — Other Ambulatory Visit: Payer: Self-pay

## 2015-03-17 ENCOUNTER — Ambulatory Visit (HOSPITAL_BASED_OUTPATIENT_CLINIC_OR_DEPARTMENT_OTHER): Payer: 59

## 2015-03-17 VITALS — BP 125/62 | HR 87 | Temp 98.1°F

## 2015-03-17 DIAGNOSIS — C541 Malignant neoplasm of endometrium: Secondary | ICD-10-CM

## 2015-03-17 DIAGNOSIS — D709 Neutropenia, unspecified: Secondary | ICD-10-CM | POA: Diagnosis not present

## 2015-03-17 MED ORDER — TBO-FILGRASTIM 300 MCG/0.5ML ~~LOC~~ SOSY
300.0000 ug | PREFILLED_SYRINGE | Freq: Once | SUBCUTANEOUS | Status: AC
  Administered 2015-03-17: 300 ug via SUBCUTANEOUS
  Filled 2015-03-17: qty 0.5

## 2015-03-19 ENCOUNTER — Other Ambulatory Visit: Payer: Self-pay | Admitting: Oncology

## 2015-03-19 DIAGNOSIS — C541 Malignant neoplasm of endometrium: Secondary | ICD-10-CM

## 2015-03-20 ENCOUNTER — Telehealth: Payer: Self-pay | Admitting: Oncology

## 2015-03-20 ENCOUNTER — Encounter: Payer: Self-pay | Admitting: Oncology

## 2015-03-20 ENCOUNTER — Telehealth: Payer: Self-pay | Admitting: *Deleted

## 2015-03-20 ENCOUNTER — Other Ambulatory Visit (HOSPITAL_BASED_OUTPATIENT_CLINIC_OR_DEPARTMENT_OTHER): Payer: 59

## 2015-03-20 ENCOUNTER — Ambulatory Visit (HOSPITAL_BASED_OUTPATIENT_CLINIC_OR_DEPARTMENT_OTHER): Payer: 59 | Admitting: Oncology

## 2015-03-20 VITALS — BP 154/68 | HR 90 | Temp 97.5°F | Resp 18 | Ht 61.0 in | Wt 121.9 lb

## 2015-03-20 DIAGNOSIS — E119 Type 2 diabetes mellitus without complications: Secondary | ICD-10-CM

## 2015-03-20 DIAGNOSIS — C541 Malignant neoplasm of endometrium: Secondary | ICD-10-CM

## 2015-03-20 DIAGNOSIS — G622 Polyneuropathy due to other toxic agents: Secondary | ICD-10-CM | POA: Diagnosis not present

## 2015-03-20 DIAGNOSIS — D649 Anemia, unspecified: Secondary | ICD-10-CM | POA: Diagnosis not present

## 2015-03-20 DIAGNOSIS — E878 Other disorders of electrolyte and fluid balance, not elsewhere classified: Secondary | ICD-10-CM

## 2015-03-20 DIAGNOSIS — T451X5A Adverse effect of antineoplastic and immunosuppressive drugs, initial encounter: Secondary | ICD-10-CM

## 2015-03-20 DIAGNOSIS — K219 Gastro-esophageal reflux disease without esophagitis: Secondary | ICD-10-CM

## 2015-03-20 DIAGNOSIS — I10 Essential (primary) hypertension: Secondary | ICD-10-CM

## 2015-03-20 DIAGNOSIS — D701 Agranulocytosis secondary to cancer chemotherapy: Secondary | ICD-10-CM

## 2015-03-20 DIAGNOSIS — E871 Hypo-osmolality and hyponatremia: Secondary | ICD-10-CM

## 2015-03-20 LAB — COMPREHENSIVE METABOLIC PANEL (CC13)
ALBUMIN: 3.7 g/dL (ref 3.5–5.0)
ALT: 22 U/L (ref 0–55)
AST: 16 U/L (ref 5–34)
Alkaline Phosphatase: 68 U/L (ref 40–150)
Anion Gap: 11 mEq/L (ref 3–11)
BUN: 6.1 mg/dL — AB (ref 7.0–26.0)
CALCIUM: 9 mg/dL (ref 8.4–10.4)
CHLORIDE: 104 meq/L (ref 98–109)
CO2: 19 mEq/L — ABNORMAL LOW (ref 22–29)
CREATININE: 0.7 mg/dL (ref 0.6–1.1)
EGFR: 87 mL/min/{1.73_m2} — AB (ref >90)
GLUCOSE: 117 mg/dL (ref 70–140)
POTASSIUM: 4.6 meq/L (ref 3.5–5.1)
Sodium: 135 mEq/L — ABNORMAL LOW (ref 136–145)
Total Bilirubin: <0.2 mg/dL (ref 0.20–1.20)
Total Protein: 6.4 g/dL (ref 6.4–8.3)

## 2015-03-20 LAB — CBC WITH DIFFERENTIAL/PLATELET
BASO%: 1.3 % (ref 0.0–2.0)
Basophils Absolute: 0.1 10*3/uL (ref 0.0–0.1)
EOS%: 3 % (ref 0.0–7.0)
Eosinophils Absolute: 0.1 10*3/uL (ref 0.0–0.5)
HEMATOCRIT: 26 % — AB (ref 34.8–46.6)
HGB: 8.8 g/dL — ABNORMAL LOW (ref 11.6–15.9)
LYMPH%: 46.6 % (ref 14.0–49.7)
MCH: 28.3 pg (ref 25.1–34.0)
MCHC: 33.8 g/dL (ref 31.5–36.0)
MCV: 83.6 fL (ref 79.5–101.0)
MONO#: 1.1 10*3/uL — ABNORMAL HIGH (ref 0.1–0.9)
MONO%: 22.8 % — AB (ref 0.0–14.0)
NEUT#: 1.2 10*3/uL — ABNORMAL LOW (ref 1.5–6.5)
NEUT%: 26.3 % — ABNORMAL LOW (ref 38.4–76.8)
PLATELETS: 169 10*3/uL (ref 145–400)
RBC: 3.11 10*6/uL — ABNORMAL LOW (ref 3.70–5.45)
RDW: 15.4 % — ABNORMAL HIGH (ref 11.2–14.5)
WBC: 4.6 10*3/uL (ref 3.9–10.3)
lymph#: 2.2 10*3/uL (ref 0.9–3.3)

## 2015-03-20 NOTE — Telephone Encounter (Signed)
Per staff message and POF I have scheduled appts. Advised scheduler of appts. JMW  

## 2015-03-20 NOTE — Telephone Encounter (Signed)
Patient appointments made and patient will get a new schedule today

## 2015-03-20 NOTE — Progress Notes (Signed)
OFFICE PROGRESS NOTE   March 20, 2015   Physicians:[name]/ Laurine Blazer, Providence Crosby, MD (PCP, [facility]), Aloha Gell  INTERVAL HISTORY:  Patient is see, alone for visit, in continuing attention to adjuvant chemotherapy in process for IIIA grade 3 serous endometrial carcinoma, having had cycle 2 carboplatin taxol on 03-09-15 with granix 300 mg on 4-14 and 4-15. Plan is 6 cycles of chemotherapy then consideration of external beam RT + vaginal brachytherapy if CR.  Patient was tired x 4 days after chemo, but has felt better overall since then. She did not have significant nausea, no vomiting, did have increased GERD which improved more with home remedy than with Protonix. She has had intermittent pain in soles of feet, denies numbness, and no symptoms in hands. Blood sugars have been mostly 120-140, this AM 85 without hypoglycemic symptoms. Bowels are moving without difficulty. She did not have aches with taxol or granix, used claritin x 1 day.    No PAC Did not have flu vaccine  Daughter from [place] visited week of last chemo.  ONCOLOGIC HISTORY Patient presented to Dr [name] 01-03-15 with vaginal spotting x 1 year. Pelvic US showed 33 mm endometrial stripe with ovaries normal, and endometrial biopsy also 01-03-15 reportedly had grade 3 endometrial adenocarcinoma. She was seen in consultation by Dr Denman George on 2-8-1. CT CAP 01-13-15 had negative chest, no adenopathy, mild fatty liver, markedly thickened endometrium and no evidence of metastatic disease outside of uterus. CA 125 preoperatively on 01-13-15 was 22.9. Surgery by Dr [name] 01-19-15 was robotic hysterectomy, BSO and resection of nodule at sigmoid serosa. At surgery there was milialtumor studding with 5-10 mm implants on bladder peritoneum and miliary disease on pelvic sidewalls, without upper abdominal disease evident, and omentum appeared normal. Pathology ([id number]) found IIIA grade 3 serous/ endometrioid endometrial carcinoma with 8 mm /  12 mm myometrial invasion (60%), + LVSI, and extensive serous involvement of sigmoid nodule. She was seen for post operative follow up by Dr Denman George on 02-03-15, with recommendation for 6 cycles of taxol carboplatin, then consideration of external beam RT + vaginal brachytherapy if CR. First carboplatin taxol was given 02-16-2015 and she was neutropenic by day 15 of that cycle.    Review of systems as above, also: Peripheral IV access not difficult. Bladder ok. No bleeding. Some SOB with increased exertion, none with regular activities.  Remainder of 10 point Review of Systems negative.  Objective:  Vital signs in last 24 hours:  BP 154/68 mmHg  Pulse 90  Temp(Src) 97.5 F (36.4 C) (Oral)  Resp 18  Ht 5\' 1"  (1.549 m)  Wt 121 lb 14.4 oz (55.293 kg)  BMI 23.04 kg/m2  SpO2 99% Weight down 3 lbs Alert, oriented and appropriate. Ambulatory without difficulty.  Hair is thinning but no complete alopecia  HEENT:PERRL, sclerae not icteric. Oral mucosa moist without lesions, posterior pharynx clear.  Neck supple. No JVD.  Lymphatics:no cervical,supraclavicular or inguinal adenopathy Resp: clear to auscultation bilaterally and normal percussion bilaterally Cardio: regular rate and rhythm. No gallop. GI: soft, nontender, not distended, no mass or organomegaly. Normally active bowel sounds. Surgical incisions not remarkable. Musculoskeletal/ Extremities: without pitting edema, cords, tenderness Neuro: no significant peripheral neuropathy. Otherwise nonfocal Skin without rash, ecchymosis, petechiae  Lab Results:  Results for orders placed or performed in visit on 03/20/15  CBC with Differential  Result Value Ref Range   WBC 4.6 3.9 - 10.3 10e3/uL   NEUT# 1.2 (L) 1.5 - 6.5 10e3/uL   HGB 8.8 (L)  11.6 - 15.9 g/dL   HCT 26.0 (L) 34.8 - 46.6 %   Platelets 169 145 - 400 10e3/uL   MCV 83.6 79.5 - 101.0 fL   MCH 28.3 25.1 - 34.0 pg   MCHC 33.8 31.5 - 36.0 g/dL   RBC 3.11 (L) 3.70 - 5.45 10e6/uL    RDW 15.4 (H) 11.2 - 14.5 %   lymph# 2.2 0.9 - 3.3 10e3/uL   MONO# 1.1 (H) 0.1 - 0.9 10e3/uL   Eosinophils Absolute 0.1 0.0 - 0.5 10e3/uL   Basophils Absolute 0.1 0.0 - 0.1 10e3/uL   NEUT% 26.3 (L) 38.4 - 76.8 %   LYMPH% 46.6 14.0 - 49.7 %   MONO% 22.8 (H) 0.0 - 14.0 %   EOS% 3.0 0.0 - 7.0 %   BASO% 1.3 0.0 - 2.0 %   CMET available after visit normal with exception of Na 135, CO2 19, BUN 6.1  Studies/Results:  No results found.  Medications: I have reviewed the patient's current medications. Home OTC iron preparation "65 mg"; she has been taking this with food, recommended taking away from meals with OJ or vit C tablet. Message sent to financial staff to be sure granix has been preauthorized; counts seem to be recovering based on platelets and status today, so will not give a third dose now.  DISCUSSION: patient is pleased that she is tolerating treatment this well, and is very willing to continue as planned. Iron as above. Discussed neuropathy in feet, encouraged comfortable supportive shoes, massage, lots of movement.    Assessment/Plan: 1.IIIA grade 3 serous endometrial carcinoma: residual at least miliary disease at completion of surgery. Continuing q 3 week carboplatin taxol, with granix support. She will have cycle 3 on 03-30-15 as long as [place] >=1.5 and plt >=100k that day. She will have granix on 5-5 and 5-6 and I will see her again shortly after that.  2.diabetes x 15 years: will check blood sugar and cover with regular insulin on sliding scale with each treatment. Home blood sugars in good range now. PCP manages diabetes. 3. Vaginal drainage resolved 4.Hypertension, generally well controlled, on tenoretic  5.anemia: B12 ok despite vegetarian diet. Iron administration as above 6.glaucoma 7.hyponatremia, hypochloremia: likely due to diuretic, improved with changes in po fluids 8.GERD: protonix apparently not helpful, better with home remedy  9.peripheral neuropathy soles of  feet: likely taxol with underlying diabetes. Just intermittent and not bothersome, follow. Note taxol dose has been 175 mg/m2  All questions answered; patient knows that she can call if any concerns prior to next scheduled visit. Chemo orders reviewed, granix orders placed. Time spent 25 min including >50% counseling and coordination of care.    LIVESAY,LENNIS P, MD   03/20/2015, 8:43 AM

## 2015-03-20 NOTE — Patient Instructions (Signed)
Take iron away from meals (on empty stomach) with orange juice or orange or Vitamin C tablet, to help absorption. OK to take 2 of the 65 mg iron tablets. Please bring the bottle of iron tablets to next doctor visit

## 2015-03-30 ENCOUNTER — Other Ambulatory Visit (HOSPITAL_BASED_OUTPATIENT_CLINIC_OR_DEPARTMENT_OTHER): Payer: 59

## 2015-03-30 ENCOUNTER — Ambulatory Visit (HOSPITAL_BASED_OUTPATIENT_CLINIC_OR_DEPARTMENT_OTHER): Payer: 59

## 2015-03-30 ENCOUNTER — Other Ambulatory Visit: Payer: Self-pay

## 2015-03-30 DIAGNOSIS — C541 Malignant neoplasm of endometrium: Secondary | ICD-10-CM

## 2015-03-30 DIAGNOSIS — Z5111 Encounter for antineoplastic chemotherapy: Secondary | ICD-10-CM | POA: Diagnosis not present

## 2015-03-30 DIAGNOSIS — E119 Type 2 diabetes mellitus without complications: Secondary | ICD-10-CM

## 2015-03-30 LAB — COMPREHENSIVE METABOLIC PANEL (CC13)
ALT: 16 U/L (ref 0–55)
ANION GAP: 19 meq/L — AB (ref 3–11)
AST: 13 U/L (ref 5–34)
Albumin: 3.8 g/dL (ref 3.5–5.0)
Alkaline Phosphatase: 71 U/L (ref 40–150)
BILIRUBIN TOTAL: 0.22 mg/dL (ref 0.20–1.20)
BUN: 12.2 mg/dL (ref 7.0–26.0)
CALCIUM: 9.2 mg/dL (ref 8.4–10.4)
CHLORIDE: 102 meq/L (ref 98–109)
CO2: 15 mEq/L — ABNORMAL LOW (ref 22–29)
Creatinine: 0.8 mg/dL (ref 0.6–1.1)
EGFR: 77 mL/min/{1.73_m2} — ABNORMAL LOW (ref >90)
Glucose: 295 mg/dl — ABNORMAL HIGH (ref 70–140)
Potassium: 4.8 mEq/L (ref 3.5–5.1)
Sodium: 136 mEq/L (ref 136–145)
Total Protein: 6.9 g/dL (ref 6.4–8.3)

## 2015-03-30 LAB — CBC WITH DIFFERENTIAL/PLATELET
BASO%: 0 % (ref 0.0–2.0)
BASOS ABS: 0 10*3/uL (ref 0.0–0.1)
EOS%: 0 % (ref 0.0–7.0)
Eosinophils Absolute: 0 10*3/uL (ref 0.0–0.5)
HCT: 25.3 % — ABNORMAL LOW (ref 34.8–46.6)
HEMOGLOBIN: 8.7 g/dL — AB (ref 11.6–15.9)
LYMPH%: 9.3 % — ABNORMAL LOW (ref 14.0–49.7)
MCH: 28.6 pg (ref 25.1–34.0)
MCHC: 34.4 g/dL (ref 31.5–36.0)
MCV: 83.2 fL (ref 79.5–101.0)
MONO#: 0 10*3/uL — ABNORMAL LOW (ref 0.1–0.9)
MONO%: 0.4 % (ref 0.0–14.0)
NEUT#: 6.8 10*3/uL — ABNORMAL HIGH (ref 1.5–6.5)
NEUT%: 90.3 % — ABNORMAL HIGH (ref 38.4–76.8)
Platelets: 113 10*3/uL — ABNORMAL LOW (ref 145–400)
RBC: 3.04 10*6/uL — ABNORMAL LOW (ref 3.70–5.45)
RDW: 16.6 % — AB (ref 11.2–14.5)
WBC: 7.5 10*3/uL (ref 3.9–10.3)
lymph#: 0.7 10*3/uL — ABNORMAL LOW (ref 0.9–3.3)

## 2015-03-30 LAB — WHOLE BLOOD GLUCOSE
GLUCOSE: 210 mg/dL — AB (ref 70–100)
HRS PC: 3 Hours

## 2015-03-30 MED ORDER — INSULIN REGULAR HUMAN 100 UNIT/ML IJ SOLN
4.0000 [IU] | Freq: Once | INTRAMUSCULAR | Status: AC
  Administered 2015-03-30: 4 [IU] via SUBCUTANEOUS
  Filled 2015-03-30: qty 0.04

## 2015-03-30 MED ORDER — INSULIN REGULAR HUMAN 100 UNIT/ML IJ SOLN
6.0000 [IU] | Freq: Once | INTRAMUSCULAR | Status: AC | PRN
  Administered 2015-03-30: 6 [IU] via SUBCUTANEOUS
  Filled 2015-03-30: qty 0.06

## 2015-03-30 MED ORDER — FAMOTIDINE IN NACL 20-0.9 MG/50ML-% IV SOLN
20.0000 mg | Freq: Once | INTRAVENOUS | Status: AC
  Administered 2015-03-30: 20 mg via INTRAVENOUS

## 2015-03-30 MED ORDER — FAMOTIDINE IN NACL 20-0.9 MG/50ML-% IV SOLN
INTRAVENOUS | Status: AC
  Filled 2015-03-30: qty 50

## 2015-03-30 MED ORDER — DIPHENHYDRAMINE HCL 50 MG/ML IJ SOLN
50.0000 mg | Freq: Once | INTRAMUSCULAR | Status: AC
  Administered 2015-03-30: 50 mg via INTRAVENOUS

## 2015-03-30 MED ORDER — DIPHENHYDRAMINE HCL 50 MG/ML IJ SOLN
INTRAMUSCULAR | Status: AC
  Filled 2015-03-30: qty 1

## 2015-03-30 MED ORDER — SODIUM CHLORIDE 0.9 % IV SOLN
Freq: Once | INTRAVENOUS | Status: AC
  Administered 2015-03-30: 13:00:00 via INTRAVENOUS
  Filled 2015-03-30: qty 8

## 2015-03-30 MED ORDER — SODIUM CHLORIDE 0.9 % IV SOLN
531.6000 mg | Freq: Once | INTRAVENOUS | Status: AC
  Administered 2015-03-30: 530 mg via INTRAVENOUS
  Filled 2015-03-30: qty 53

## 2015-03-30 MED ORDER — INSULIN REGULAR HUMAN 100 UNIT/ML IJ SOLN
2.0000 [IU] | Freq: Once | INTRAMUSCULAR | Status: DC
  Filled 2015-03-30: qty 0.02

## 2015-03-30 MED ORDER — PACLITAXEL CHEMO INJECTION 300 MG/50ML
175.0000 mg/m2 | Freq: Once | INTRAVENOUS | Status: AC
  Administered 2015-03-30: 276 mg via INTRAVENOUS
  Filled 2015-03-30: qty 46

## 2015-03-30 MED ORDER — SODIUM CHLORIDE 0.9 % IV SOLN
Freq: Once | INTRAVENOUS | Status: AC
  Administered 2015-03-30: 12:00:00 via INTRAVENOUS

## 2015-03-30 NOTE — Patient Instructions (Signed)
[facility] Discharge Instructions for Patients Receiving Chemotherapy  Today you received the following chemotherapy agents: Taxol and Carboplatin.  To help prevent nausea and vomiting after your treatment, we encourage you to take your nausea medication: Zofran. Take one every 8 hours as needed.   If you develop nausea and vomiting that is not controlled by your nausea medication, call the clinic.   BELOW ARE SYMPTOMS THAT SHOULD BE REPORTED IMMEDIATELY:  *FEVER GREATER THAN 100.5 F  *CHILLS WITH OR WITHOUT FEVER  NAUSEA AND VOMITING THAT IS NOT CONTROLLED WITH YOUR NAUSEA MEDICATION  *UNUSUAL SHORTNESS OF BREATH  *UNUSUAL BRUISING OR BLEEDING  TENDERNESS IN MOUTH AND THROAT WITH OR WITHOUT PRESENCE OF ULCERS  *URINARY PROBLEMS  *BOWEL PROBLEMS  UNUSUAL RASH Items with * indicate a potential emergency and should be followed up as soon as possible.  Feel free to call the clinic should you have any questions or concerns. The clinic phone number is [phone].  Please show the [place] [facility] at check-in to the Emergency Department and triage nurse.

## 2015-04-06 ENCOUNTER — Ambulatory Visit (HOSPITAL_BASED_OUTPATIENT_CLINIC_OR_DEPARTMENT_OTHER): Payer: 59

## 2015-04-06 VITALS — BP 123/72 | HR 92 | Temp 97.9°F

## 2015-04-06 DIAGNOSIS — Z5189 Encounter for other specified aftercare: Secondary | ICD-10-CM | POA: Diagnosis not present

## 2015-04-06 DIAGNOSIS — C541 Malignant neoplasm of endometrium: Secondary | ICD-10-CM

## 2015-04-06 MED ORDER — TBO-FILGRASTIM 300 MCG/0.5ML ~~LOC~~ SOSY
300.0000 ug | PREFILLED_SYRINGE | Freq: Once | SUBCUTANEOUS | Status: AC
  Administered 2015-04-06: 300 ug via SUBCUTANEOUS
  Filled 2015-04-06: qty 0.5

## 2015-04-06 NOTE — Patient Instructions (Signed)

## 2015-04-07 ENCOUNTER — Ambulatory Visit (HOSPITAL_BASED_OUTPATIENT_CLINIC_OR_DEPARTMENT_OTHER): Payer: 59

## 2015-04-07 VITALS — BP 126/70 | HR 103 | Temp 97.6°F

## 2015-04-07 DIAGNOSIS — Z5189 Encounter for other specified aftercare: Secondary | ICD-10-CM | POA: Diagnosis not present

## 2015-04-07 DIAGNOSIS — C541 Malignant neoplasm of endometrium: Secondary | ICD-10-CM

## 2015-04-07 MED ORDER — TBO-FILGRASTIM 300 MCG/0.5ML ~~LOC~~ SOSY
300.0000 ug | PREFILLED_SYRINGE | Freq: Once | SUBCUTANEOUS | Status: AC
  Administered 2015-04-07: 300 ug via SUBCUTANEOUS
  Filled 2015-04-07: qty 0.5

## 2015-04-07 NOTE — Patient Instructions (Signed)

## 2015-04-09 ENCOUNTER — Other Ambulatory Visit: Payer: Self-pay | Admitting: Oncology

## 2015-04-09 DIAGNOSIS — C541 Malignant neoplasm of endometrium: Secondary | ICD-10-CM

## 2015-04-09 DIAGNOSIS — E119 Type 2 diabetes mellitus without complications: Secondary | ICD-10-CM

## 2015-04-11 ENCOUNTER — Encounter: Payer: Self-pay | Admitting: Oncology

## 2015-04-11 ENCOUNTER — Ambulatory Visit (HOSPITAL_BASED_OUTPATIENT_CLINIC_OR_DEPARTMENT_OTHER): Payer: 59 | Admitting: Oncology

## 2015-04-11 ENCOUNTER — Telehealth: Payer: Self-pay | Admitting: Oncology

## 2015-04-11 ENCOUNTER — Other Ambulatory Visit (HOSPITAL_BASED_OUTPATIENT_CLINIC_OR_DEPARTMENT_OTHER): Payer: 59

## 2015-04-11 VITALS — BP 147/63 | HR 90 | Temp 98.0°F | Resp 18 | Ht 61.0 in | Wt 122.8 lb

## 2015-04-11 DIAGNOSIS — E871 Hypo-osmolality and hyponatremia: Secondary | ICD-10-CM

## 2015-04-11 DIAGNOSIS — E119 Type 2 diabetes mellitus without complications: Secondary | ICD-10-CM

## 2015-04-11 DIAGNOSIS — D6481 Anemia due to antineoplastic chemotherapy: Secondary | ICD-10-CM

## 2015-04-11 DIAGNOSIS — K219 Gastro-esophageal reflux disease without esophagitis: Secondary | ICD-10-CM

## 2015-04-11 DIAGNOSIS — C55 Malignant neoplasm of uterus, part unspecified: Secondary | ICD-10-CM

## 2015-04-11 DIAGNOSIS — C541 Malignant neoplasm of endometrium: Secondary | ICD-10-CM | POA: Diagnosis not present

## 2015-04-11 DIAGNOSIS — T451X5A Adverse effect of antineoplastic and immunosuppressive drugs, initial encounter: Secondary | ICD-10-CM

## 2015-04-11 DIAGNOSIS — D509 Iron deficiency anemia, unspecified: Secondary | ICD-10-CM

## 2015-04-11 DIAGNOSIS — D701 Agranulocytosis secondary to cancer chemotherapy: Secondary | ICD-10-CM

## 2015-04-11 DIAGNOSIS — G620 Drug-induced polyneuropathy: Secondary | ICD-10-CM

## 2015-04-11 DIAGNOSIS — E878 Other disorders of electrolyte and fluid balance, not elsewhere classified: Secondary | ICD-10-CM

## 2015-04-11 DIAGNOSIS — I10 Essential (primary) hypertension: Secondary | ICD-10-CM

## 2015-04-11 LAB — CBC WITH DIFFERENTIAL/PLATELET
BASO%: 0.5 % (ref 0.0–2.0)
Basophils Absolute: 0 10*3/uL (ref 0.0–0.1)
EOS%: 0.3 % (ref 0.0–7.0)
Eosinophils Absolute: 0 10*3/uL (ref 0.0–0.5)
HCT: 23.4 % — ABNORMAL LOW (ref 34.8–46.6)
HEMOGLOBIN: 7.9 g/dL — AB (ref 11.6–15.9)
LYMPH%: 29.3 % (ref 14.0–49.7)
MCH: 29.2 pg (ref 25.1–34.0)
MCHC: 33.9 g/dL (ref 31.5–36.0)
MCV: 86.1 fL (ref 79.5–101.0)
MONO#: 1.6 10*3/uL — ABNORMAL HIGH (ref 0.1–0.9)
MONO%: 17.7 % — ABNORMAL HIGH (ref 0.0–14.0)
NEUT%: 52.2 % (ref 38.4–76.8)
NEUTROS ABS: 4.6 10*3/uL (ref 1.5–6.5)
PLATELETS: 210 10*3/uL (ref 145–400)
RBC: 2.72 10*6/uL — ABNORMAL LOW (ref 3.70–5.45)
RDW: 17.5 % — ABNORMAL HIGH (ref 11.2–14.5)
WBC: 8.8 10*3/uL (ref 3.9–10.3)
lymph#: 2.6 10*3/uL (ref 0.9–3.3)

## 2015-04-11 LAB — COMPREHENSIVE METABOLIC PANEL (CC13)
ALBUMIN: 3.6 g/dL (ref 3.5–5.0)
ALK PHOS: 80 U/L (ref 40–150)
ALT: 15 U/L (ref 0–55)
AST: 17 U/L (ref 5–34)
Anion Gap: 11 mEq/L (ref 3–11)
BUN: 5.7 mg/dL — ABNORMAL LOW (ref 7.0–26.0)
CALCIUM: 8.5 mg/dL (ref 8.4–10.4)
CHLORIDE: 99 meq/L (ref 98–109)
CO2: 23 mEq/L (ref 22–29)
Creatinine: 0.7 mg/dL (ref 0.6–1.1)
EGFR: 85 mL/min/{1.73_m2} — AB (ref >90)
GLUCOSE: 181 mg/dL — AB (ref 70–140)
Potassium: 4.5 mEq/L (ref 3.5–5.1)
Sodium: 132 mEq/L — ABNORMAL LOW (ref 136–145)
TOTAL PROTEIN: 6.4 g/dL (ref 6.4–8.3)
Total Bilirubin: <0.2 mg/dL (ref 0.20–1.20)

## 2015-04-11 MED ORDER — LORAZEPAM 0.5 MG PO TABS: ORAL_TABLET | ORAL | Status: DC

## 2015-04-11 MED ORDER — FERROUS FUMARATE 325 (106 FE) MG PO TABS: ORAL_TABLET | ORAL | Status: DC

## 2015-04-11 NOTE — Telephone Encounter (Signed)
Gave patient avs report and appointments for May and June.  °

## 2015-04-11 NOTE — Progress Notes (Signed)
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 Patient received cycle 3 carbo taxol on 03-30-15 with granix on 5-5 and 04-07-15. Plan is 6 cycles of chemotherapy, then consideration of pelvic radiation and vaginal brachytherapy if CR.   Patient has notices increased peripheral neuropathy since cycle 3 chemotherapy, this related to taxol. Feet are numb on soles and she has numbness distal fingers bilaterally. She denies increased SOB or other clear symptoms of anemia, tho she is generally more tired than baseline. She has had no significant N/V and no bleeding, does have more GERD symptoms and has not been taking Protonix regularly.  Bowels are moving adequately, denies black or tarry stools. Taxol and granix aches have not been severe. Peripheral IV access has been adequate.   No PAC No flu vaccine   ONCOLOGIC HISTORY Patient presented to Dr [name] 01-03-15 with vaginal spotting x 1 year. Pelvic US showed 33 mm endometrial stripe with ovaries normal, and endometrial biopsy also 01-03-15 reportedly had grade 3 endometrial adenocarcinoma. She was seen in consultation by Dr Denman George on 2-8-1. CT CAP 01-13-15 had negative chest, no adenopathy, mild fatty liver, markedly thickened endometrium and no evidence of metastatic disease outside of uterus. CA 125 preoperatively on 01-13-15 was 22.9. Surgery by Dr [name] 01-19-15 was robotic hysterectomy, BSO and resection of nodule at sigmoid serosa. At surgery there was milialtumor studding with 5-10 mm implants on bladder peritoneum and miliary disease on pelvic sidewalls, without upper abdominal disease evident, and omentum appeared normal. Pathology ([id number]) found IIIA grade 3 serous/ endometrioid endometrial carcinoma with 8 mm / 12 mm  myometrial invasion (60%), + LVSI, and extensive serous involvement of sigmoid nodule. She was seen for post operative follow up by Dr Denman George on 02-03-15, with recommendation for 6 cycles of taxol carboplatin, then consideration of external beam RT + vaginal brachytherapy if CR. First carboplatin taxol was given 02-16-2015; she was neutropenic with ANC 0.7 by day 15 cycle 1.    Review of systems as above, also: No fever or symptoms of infection. No bleeding. Blood sugars mostly in reasonable range. Remainder of 10 point Review of Systems negative.  Objective:  Vital signs in last 24 hours:  BP 147/63 mmHg  Pulse 90  Temp(Src) 98 F (36.7 C) (Oral)  Resp 18  Ht 5' 1"  (1.549 m)  Wt 122 lb 12.8 oz (55.702 kg)  BMI 23.21 kg/m2 Weight up 1 lb Alert, oriented and appropriate. Ambulatory without difficulty.  Alopecia  HEENT:PERRL, sclerae not icteric. Oral mucosa moist without lesions, posterior pharynx clear. Mucous membranes somewhat pale Neck supple. No JVD.  Lymphatics:no cervical,supraclavicular, axillary or inguinal adenopathy Resp: clear to auscultation bilaterally and normal percussion bilaterally Cardio: regular rate and rhythm. No gallop. GI: soft, nontender, not distended, no mass or organomegaly. Normally active bowel sounds. Surgical incisions not remarkable. Musculoskeletal/ Extremities: without pitting edema, cords, tenderness Neuro:  peripheral neuropathy as noted. Otherwise nonfocal. PSYCH appropriate mood and affect Skin without rash, ecchymosis, petechiae   Lab Results:  Results for orders placed or performed in visit on 04/11/15  CBC with Differential  Result Value Ref Range   WBC 8.8 3.9 - 10.3 10e3/uL   NEUT# 4.6 1.5 - 6.5 10e3/uL   HGB 7.9 (L) 11.6 - 15.9 g/dL   HCT 23.4 (L) 34.8 -  46.6 %   Platelets 210 145 - 400 10e3/uL   MCV 86.1 79.5 - 101.0 fL   MCH 29.2 25.1 - 34.0 pg   MCHC 33.9 31.5 - 36.0 g/dL   RBC 2.72 (L) 3.70 - 5.45 10e6/uL   RDW 17.5 (H)  11.2 - 14.5 %   lymph# 2.6 0.9 - 3.3 10e3/uL   MONO# 1.6 (H) 0.1 - 0.9 10e3/uL   Eosinophils Absolute 0.0 0.0 - 0.5 10e3/uL   Basophils Absolute 0.0 0.0 - 0.1 10e3/uL   NEUT% 52.2 38.4 - 76.8 %   LYMPH% 29.3 14.0 - 49.7 %   MONO% 17.7 (H) 0.0 - 14.0 %   EOS% 0.3 0.0 - 7.0 %   BASO% 0.5 0.0 - 2.0 %  Comprehensive metabolic panel (Cmet) - [facility]  Result Value Ref Range   Sodium 132 (L) 136 - 145 mEq/L   Potassium 4.5 3.5 - 5.1 mEq/L   Chloride 99 98 - 109 mEq/L   CO2 23 22 - 29 mEq/L   Glucose 181 (H) 70 - 140 mg/dl   BUN 5.7 (L) 7.0 - 26.0 mg/dL   Creatinine 0.7 0.6 - 1.1 mg/dL   Total Bilirubin <0.20 0.20 - 1.20 mg/dL   Alkaline Phosphatase 80 40 - 150 U/L   AST 17 5 - 34 U/L   ALT 15 0 - 55 U/L   Total Protein 6.4 6.4 - 8.3 g/dL   Albumin 3.6 3.5 - 5.0 g/dL   Calcium 8.5 8.4 - 10.4 mg/dL   Anion Gap 11 3 - 11 mEq/L   EGFR 85 (L) >90 ml/min/1.73 m2     Studies/Results:  No results found.  Medications: I have reviewed the patient's current medications. Change iron preparation to Hemocyte/ ferrous fumarate 325 mg daily on empty stomach with OJ or vit C. Resume Protonix daily   DISCUSSION: discussed progressive anemia, which still is not markedly symptomatic and she prefers not to have PRBCs yet. Will change iron as above and follow up with labs next week, but would transfuse next week if hgb lower or more symptomatic  Assessment/Plan:  1.IIIA grade 3 serous endometrial carcinoma: residual at least miliary disease at completion of surgery. She will have cycle 4 carbo taxol on 04-20-15 as long as ANC >=1.5 and plt >=100k, with granix 5-26 and 5-27.  She will need PRBCs if hemoglobin lower then or more symptomatic. With increase in peripheral neuropathy will decrease taxol slightly. 2.diabetes x 15 years: will check blood sugar and cover with regular insulin on sliding scale with each treatment. Home blood sugars reasonable from my standpoint now. PCP manages diabetes. 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 3. Vaginal drainage resolved 4.Hypertension, generally well controlled, on tenoretic (see below) 5.anemia: B12 ok despite vegetarian diet, iron studies not low previously but will ad oral iron as Hemocyte/ ferrous fumarate for chemo anemia. She understands that she will need PRBCs if hgb lower or more symptomatic with next CBC 6.glaucoma 7.hyponatremia, hypochloremia: likely due to diuretic, present during hospitalization for recent surgery also. Improved with dietary changes since starting treatment (G2 gatorade). This information sent to PCP at initial consult. Not symptomatic. 8.GERD: use protonix daily now   All questions answered and patient is in agreement with recommendations and plans. Chemo orders adjusted. Time spent 25 min including >50% counseling and coordination of care.   Keyshaun Exley P, MD   04/11/2015, 3:29 PM

## 2015-04-14 DIAGNOSIS — G620 Drug-induced polyneuropathy: Secondary | ICD-10-CM | POA: Insufficient documentation

## 2015-04-14 DIAGNOSIS — D6481 Anemia due to antineoplastic chemotherapy: Secondary | ICD-10-CM | POA: Insufficient documentation

## 2015-04-14 DIAGNOSIS — T451X5A Adverse effect of antineoplastic and immunosuppressive drugs, initial encounter: Secondary | ICD-10-CM

## 2015-04-20 ENCOUNTER — Ambulatory Visit: Payer: 59

## 2015-04-20 ENCOUNTER — Telehealth: Payer: Self-pay | Admitting: *Deleted

## 2015-04-20 ENCOUNTER — Other Ambulatory Visit: Payer: Self-pay | Admitting: *Deleted

## 2015-04-20 ENCOUNTER — Ambulatory Visit (HOSPITAL_COMMUNITY)
Admission: RE | Admit: 2015-04-20 | Discharge: 2015-04-20 | Disposition: A | Discharge status: Home / Self Care | Payer: 59 | Source: Ambulatory Visit | Attending: Oncology | Admitting: Oncology

## 2015-04-20 ENCOUNTER — Other Ambulatory Visit (HOSPITAL_BASED_OUTPATIENT_CLINIC_OR_DEPARTMENT_OTHER): Payer: 59

## 2015-04-20 ENCOUNTER — Ambulatory Visit (HOSPITAL_BASED_OUTPATIENT_CLINIC_OR_DEPARTMENT_OTHER): Payer: 59

## 2015-04-20 VITALS — BP 139/74 | HR 101 | Temp 98.3°F | Resp 16

## 2015-04-20 DIAGNOSIS — C541 Malignant neoplasm of endometrium: Secondary | ICD-10-CM | POA: Insufficient documentation

## 2015-04-20 DIAGNOSIS — E119 Type 2 diabetes mellitus without complications: Secondary | ICD-10-CM

## 2015-04-20 LAB — PREPARE RBC (CROSSMATCH)

## 2015-04-20 LAB — CBC WITH DIFFERENTIAL/PLATELET
BASO%: 0.1 % (ref 0.0–2.0)
BASOS ABS: 0 10*3/uL (ref 0.0–0.1)
EOS%: 0 % (ref 0.0–7.0)
Eosinophils Absolute: 0 10*3/uL (ref 0.0–0.5)
HEMATOCRIT: 23 % — AB (ref 34.8–46.6)
HEMOGLOBIN: 7.8 g/dL — AB (ref 11.6–15.9)
LYMPH%: 7.1 % — ABNORMAL LOW (ref 14.0–49.7)
MCH: 28.8 pg (ref 25.1–34.0)
MCHC: 33.9 g/dL (ref 31.5–36.0)
MCV: 84.9 fL (ref 79.5–101.0)
MONO#: 0 10*3/uL — ABNORMAL LOW (ref 0.1–0.9)
MONO%: 0.2 % (ref 0.0–14.0)
NEUT#: 8 10*3/uL — ABNORMAL HIGH (ref 1.5–6.5)
NEUT%: 92.6 % — AB (ref 38.4–76.8)
PLATELETS: 75 10*3/uL — AB (ref 145–400)
RBC: 2.71 10*6/uL — ABNORMAL LOW (ref 3.70–5.45)
RDW: 18.3 % — ABNORMAL HIGH (ref 11.2–14.5)
WBC: 8.6 10*3/uL (ref 3.9–10.3)
lymph#: 0.6 10*3/uL — ABNORMAL LOW (ref 0.9–3.3)
nRBC: 0 % (ref 0–0)

## 2015-04-20 LAB — COMPREHENSIVE METABOLIC PANEL (CC13)
ALK PHOS: 70 U/L (ref 40–150)
ALT: 13 U/L (ref 0–55)
AST: 12 U/L (ref 5–34)
Albumin: 3.9 g/dL (ref 3.5–5.0)
Anion Gap: 17 mEq/L — ABNORMAL HIGH (ref 3–11)
BILIRUBIN TOTAL: 0.34 mg/dL (ref 0.20–1.20)
BUN: 15.4 mg/dL (ref 7.0–26.0)
CO2: 15 mEq/L — ABNORMAL LOW (ref 22–29)
CREATININE: 1 mg/dL (ref 0.6–1.1)
Calcium: 8.7 mg/dL (ref 8.4–10.4)
Chloride: 101 mEq/L (ref 98–109)
EGFR: 63 mL/min/{1.73_m2} — AB (ref >90)
Glucose: 366 mg/dl — ABNORMAL HIGH (ref 70–140)
Potassium: 4.6 mEq/L (ref 3.5–5.1)
SODIUM: 133 meq/L — AB (ref 136–145)
TOTAL PROTEIN: 7 g/dL (ref 6.4–8.3)

## 2015-04-20 LAB — HOLD TUBE, BLOOD BANK

## 2015-04-20 MED ORDER — ACETAMINOPHEN 325 MG PO TABS
ORAL_TABLET | ORAL | Status: AC
  Filled 2015-04-20: qty 1

## 2015-04-20 MED ORDER — ACETAMINOPHEN 325 MG PO TABS
325.0000 mg | ORAL_TABLET | Freq: Once | ORAL | Status: AC
  Administered 2015-04-20: 325 mg via ORAL

## 2015-04-20 MED ORDER — INSULIN REGULAR HUMAN 100 UNIT/ML IJ SOLN
6.0000 [IU] | Freq: Once | INTRAMUSCULAR | Status: DC
  Administered 2015-04-20: 6 [IU] via SUBCUTANEOUS
  Filled 2015-04-20: qty 0.06

## 2015-04-20 MED ORDER — SODIUM CHLORIDE 0.9 % IV SOLN
250.0000 mL | Freq: Once | INTRAVENOUS | Status: AC
  Administered 2015-04-20: 250 mL via INTRAVENOUS

## 2015-04-20 NOTE — Telephone Encounter (Signed)
Per staff message and POF I have scheduled appts. Advised scheduler of appts. JMW  

## 2015-04-21 ENCOUNTER — Ambulatory Visit (HOSPITAL_BASED_OUTPATIENT_CLINIC_OR_DEPARTMENT_OTHER): Payer: 59

## 2015-04-21 ENCOUNTER — Other Ambulatory Visit: Payer: Self-pay | Admitting: *Deleted

## 2015-04-21 VITALS — BP 145/67 | HR 76 | Temp 97.5°F | Resp 18

## 2015-04-21 DIAGNOSIS — C541 Malignant neoplasm of endometrium: Secondary | ICD-10-CM | POA: Diagnosis not present

## 2015-04-21 DIAGNOSIS — D6481 Anemia due to antineoplastic chemotherapy: Secondary | ICD-10-CM

## 2015-04-21 MED ORDER — SODIUM CHLORIDE 0.9 % IV SOLN
250.0000 mL | Freq: Once | INTRAVENOUS | Status: AC
  Administered 2015-04-21: 250 mL via INTRAVENOUS

## 2015-04-21 MED ORDER — ACETAMINOPHEN 325 MG PO TABS
325.0000 mg | ORAL_TABLET | Freq: Once | ORAL | Status: AC
  Administered 2015-04-21: 325 mg via ORAL

## 2015-04-21 MED ORDER — ACETAMINOPHEN 325 MG PO TABS
ORAL_TABLET | ORAL | Status: AC
  Filled 2015-04-21: qty 1

## 2015-04-21 NOTE — Patient Instructions (Signed)

## 2015-04-22 LAB — TYPE AND SCREEN
ABO/RH(D): B POS
Antibody Screen: NEGATIVE
Unit division: 0
Unit division: 0

## 2015-04-23 ENCOUNTER — Other Ambulatory Visit: Payer: Self-pay | Admitting: Oncology

## 2015-04-23 NOTE — Progress Notes (Signed)
Medical Oncology  Cycle 4 delayed from 04-20-15 due to cytopenias, including platelets 75K. Transfused 1 unit PRBCs on 04-20-15. Carbo dose decreased from AUC=6 to AUC =5 for delayed cycle 4 and subsequent.  Godfrey Pick, MD

## 2015-04-26 ENCOUNTER — Encounter: Payer: Self-pay | Admitting: *Deleted

## 2015-04-27 ENCOUNTER — Ambulatory Visit (HOSPITAL_BASED_OUTPATIENT_CLINIC_OR_DEPARTMENT_OTHER): Payer: 59

## 2015-04-27 ENCOUNTER — Other Ambulatory Visit (HOSPITAL_BASED_OUTPATIENT_CLINIC_OR_DEPARTMENT_OTHER): Payer: 59

## 2015-04-27 ENCOUNTER — Ambulatory Visit: Payer: 59

## 2015-04-27 VITALS — BP 159/73 | HR 106 | Temp 96.6°F | Resp 20

## 2015-04-27 DIAGNOSIS — C541 Malignant neoplasm of endometrium: Secondary | ICD-10-CM

## 2015-04-27 DIAGNOSIS — E119 Type 2 diabetes mellitus without complications: Secondary | ICD-10-CM | POA: Diagnosis not present

## 2015-04-27 DIAGNOSIS — Z5111 Encounter for antineoplastic chemotherapy: Secondary | ICD-10-CM | POA: Diagnosis not present

## 2015-04-27 LAB — COMPREHENSIVE METABOLIC PANEL (CC13)
ALBUMIN: 3.9 g/dL (ref 3.5–5.0)
ALT: 14 U/L (ref 0–55)
AST: 13 U/L (ref 5–34)
Alkaline Phosphatase: 67 U/L (ref 40–150)
Anion Gap: 14 mEq/L — ABNORMAL HIGH (ref 3–11)
BILIRUBIN TOTAL: 0.44 mg/dL (ref 0.20–1.20)
BUN: 12.8 mg/dL (ref 7.0–26.0)
CALCIUM: 8.9 mg/dL (ref 8.4–10.4)
CO2: 19 meq/L — AB (ref 22–29)
CREATININE: 0.9 mg/dL (ref 0.6–1.1)
Chloride: 97 mEq/L — ABNORMAL LOW (ref 98–109)
EGFR: 68 mL/min/{1.73_m2} — AB (ref >90)
GLUCOSE: 321 mg/dL — AB (ref 70–140)
POTASSIUM: 4.9 meq/L (ref 3.5–5.1)
SODIUM: 130 meq/L — AB (ref 136–145)
TOTAL PROTEIN: 7.4 g/dL (ref 6.4–8.3)

## 2015-04-27 LAB — CBC WITH DIFFERENTIAL/PLATELET
BASO%: 0.2 % (ref 0.0–2.0)
BASOS ABS: 0 10*3/uL (ref 0.0–0.1)
EOS%: 0 % (ref 0.0–7.0)
Eosinophils Absolute: 0 10*3/uL (ref 0.0–0.5)
HEMATOCRIT: 38.5 % (ref 34.8–46.6)
HEMOGLOBIN: 13 g/dL (ref 11.6–15.9)
LYMPH#: 0.8 10*3/uL — AB (ref 0.9–3.3)
LYMPH%: 13.6 % — ABNORMAL LOW (ref 14.0–49.7)
MCH: 29.6 pg (ref 25.1–34.0)
MCHC: 33.9 g/dL (ref 31.5–36.0)
MCV: 87.5 fL (ref 79.5–101.0)
MONO#: 0.1 10*3/uL (ref 0.1–0.9)
MONO%: 0.9 % (ref 0.0–14.0)
NEUT#: 5.1 10*3/uL (ref 1.5–6.5)
NEUT%: 85.3 % — ABNORMAL HIGH (ref 38.4–76.8)
Platelets: 163 10*3/uL (ref 145–400)
RBC: 4.4 10*6/uL (ref 3.70–5.45)
RDW: 17.5 % — AB (ref 11.2–14.5)
WBC: 6 10*3/uL (ref 3.9–10.3)

## 2015-04-27 MED ORDER — PACLITAXEL CHEMO INJECTION 300 MG/50ML
135.0000 mg/m2 | Freq: Once | INTRAVENOUS | Status: AC
  Administered 2015-04-27: 210 mg via INTRAVENOUS
  Filled 2015-04-27: qty 35

## 2015-04-27 MED ORDER — FAMOTIDINE IN NACL 20-0.9 MG/50ML-% IV SOLN
20.0000 mg | Freq: Once | INTRAVENOUS | Status: AC
  Administered 2015-04-27: 20 mg via INTRAVENOUS

## 2015-04-27 MED ORDER — SODIUM CHLORIDE 0.9 % IV SOLN
Freq: Once | INTRAVENOUS | Status: AC
  Administered 2015-04-27: 10:00:00 via INTRAVENOUS
  Filled 2015-04-27: qty 8

## 2015-04-27 MED ORDER — CARBOPLATIN CHEMO INJECTION 600 MG/60ML
407.5000 mg | Freq: Once | INTRAVENOUS | Status: AC
  Administered 2015-04-27: 410 mg via INTRAVENOUS
  Filled 2015-04-27: qty 41

## 2015-04-27 MED ORDER — FAMOTIDINE IN NACL 20-0.9 MG/50ML-% IV SOLN
INTRAVENOUS | Status: AC
  Filled 2015-04-27: qty 50

## 2015-04-27 MED ORDER — INSULIN REGULAR HUMAN 100 UNIT/ML IJ SOLN
2.0000 [IU] | Freq: Once | INTRAMUSCULAR | Status: DC | PRN
Start: 2015-04-27 — End: 2015-04-27

## 2015-04-27 MED ORDER — INSULIN REGULAR HUMAN 100 UNIT/ML IJ SOLN
8.0000 [IU] | Freq: Once | INTRAMUSCULAR | Status: AC
  Administered 2015-04-27: 8 [IU] via SUBCUTANEOUS
  Filled 2015-04-27: qty 0.08

## 2015-04-27 MED ORDER — DIPHENHYDRAMINE HCL 50 MG/ML IJ SOLN
INTRAMUSCULAR | Status: AC
  Filled 2015-04-27: qty 1

## 2015-04-27 MED ORDER — DIPHENHYDRAMINE HCL 50 MG/ML IJ SOLN
50.0000 mg | Freq: Once | INTRAMUSCULAR | Status: AC
  Administered 2015-04-27: 50 mg via INTRAVENOUS

## 2015-04-27 MED ORDER — INSULIN REGULAR HUMAN 100 UNIT/ML IJ SOLN
6.0000 [IU] | Freq: Once | INTRAMUSCULAR | Status: DC
  Filled 2015-04-27: qty 0.06

## 2015-04-27 MED ORDER — SODIUM CHLORIDE 0.9 % IV SOLN
Freq: Once | INTRAVENOUS | Status: AC
  Administered 2015-04-27: 09:00:00 via INTRAVENOUS

## 2015-04-27 NOTE — Patient Instructions (Signed)
[facility] Discharge Instructions for Patients Receiving Chemotherapy  Today you received the following chemotherapy agents:  Taxol and Carboplatin  To help prevent nausea and vomiting after your treatment, we encourage you to take your nausea medication as ordered per MD.   If you develop nausea and vomiting that is not controlled by your nausea medication, call the clinic.   BELOW ARE SYMPTOMS THAT SHOULD BE REPORTED IMMEDIATELY:  *FEVER GREATER THAN 100.5 F  *CHILLS WITH OR WITHOUT FEVER  NAUSEA AND VOMITING THAT IS NOT CONTROLLED WITH YOUR NAUSEA MEDICATION  *UNUSUAL SHORTNESS OF BREATH  *UNUSUAL BRUISING OR BLEEDING  TENDERNESS IN MOUTH AND THROAT WITH OR WITHOUT PRESENCE OF ULCERS  *URINARY PROBLEMS  *BOWEL PROBLEMS  UNUSUAL RASH Items with * indicate a potential emergency and should be followed up as soon as possible.  Feel free to call the clinic you have any questions or concerns. The clinic phone number is [phone].  Please show the CHEMO ALERT CARD at check-in to the Emergency Department and triage nurse.   

## 2015-04-28 ENCOUNTER — Ambulatory Visit: Payer: 59

## 2015-05-03 ENCOUNTER — Telehealth: Payer: Self-pay | Admitting: Oncology

## 2015-05-03 ENCOUNTER — Other Ambulatory Visit: Payer: Self-pay | Admitting: Oncology

## 2015-05-03 NOTE — Telephone Encounter (Signed)
Per pof appointments have been adjusted and she will get a new schedule at 6/2

## 2015-05-04 ENCOUNTER — Other Ambulatory Visit: Payer: Self-pay | Admitting: *Deleted

## 2015-05-04 ENCOUNTER — Other Ambulatory Visit (HOSPITAL_BASED_OUTPATIENT_CLINIC_OR_DEPARTMENT_OTHER): Payer: 59

## 2015-05-04 ENCOUNTER — Ambulatory Visit (HOSPITAL_BASED_OUTPATIENT_CLINIC_OR_DEPARTMENT_OTHER): Payer: 59

## 2015-05-04 ENCOUNTER — Ambulatory Visit (HOSPITAL_BASED_OUTPATIENT_CLINIC_OR_DEPARTMENT_OTHER): Payer: 59 | Admitting: Oncology

## 2015-05-04 ENCOUNTER — Encounter: Payer: Self-pay | Admitting: Oncology

## 2015-05-04 ENCOUNTER — Telehealth: Payer: Self-pay | Admitting: Oncology

## 2015-05-04 VITALS — BP 125/77 | HR 80 | Temp 98.0°F | Resp 18 | Ht 61.0 in | Wt 120.6 lb

## 2015-05-04 DIAGNOSIS — E119 Type 2 diabetes mellitus without complications: Secondary | ICD-10-CM

## 2015-05-04 DIAGNOSIS — D649 Anemia, unspecified: Secondary | ICD-10-CM | POA: Diagnosis not present

## 2015-05-04 DIAGNOSIS — K219 Gastro-esophageal reflux disease without esophagitis: Secondary | ICD-10-CM

## 2015-05-04 DIAGNOSIS — C541 Malignant neoplasm of endometrium: Secondary | ICD-10-CM

## 2015-05-04 DIAGNOSIS — D701 Agranulocytosis secondary to cancer chemotherapy: Secondary | ICD-10-CM

## 2015-05-04 DIAGNOSIS — G620 Drug-induced polyneuropathy: Secondary | ICD-10-CM

## 2015-05-04 DIAGNOSIS — C55 Malignant neoplasm of uterus, part unspecified: Secondary | ICD-10-CM

## 2015-05-04 DIAGNOSIS — Z5189 Encounter for other specified aftercare: Secondary | ICD-10-CM | POA: Diagnosis not present

## 2015-05-04 DIAGNOSIS — E871 Hypo-osmolality and hyponatremia: Secondary | ICD-10-CM

## 2015-05-04 DIAGNOSIS — G622 Polyneuropathy due to other toxic agents: Secondary | ICD-10-CM | POA: Diagnosis not present

## 2015-05-04 DIAGNOSIS — I10 Essential (primary) hypertension: Secondary | ICD-10-CM

## 2015-05-04 DIAGNOSIS — T451X5A Adverse effect of antineoplastic and immunosuppressive drugs, initial encounter: Secondary | ICD-10-CM

## 2015-05-04 DIAGNOSIS — E878 Other disorders of electrolyte and fluid balance, not elsewhere classified: Secondary | ICD-10-CM

## 2015-05-04 LAB — CBC WITH DIFFERENTIAL/PLATELET
BASO%: 0.5 % (ref 0.0–2.0)
Basophils Absolute: 0 10*3/uL (ref 0.0–0.1)
EOS ABS: 0.1 10*3/uL (ref 0.0–0.5)
EOS%: 1.4 % (ref 0.0–7.0)
HCT: 33.5 % — ABNORMAL LOW (ref 34.8–46.6)
HGB: 11.7 g/dL (ref 11.6–15.9)
LYMPH%: 52.3 % — AB (ref 14.0–49.7)
MCH: 30.1 pg (ref 25.1–34.0)
MCHC: 34.9 g/dL (ref 31.5–36.0)
MCV: 86.1 fL (ref 79.5–101.0)
MONO#: 0.2 10*3/uL (ref 0.1–0.9)
MONO%: 5.1 % (ref 0.0–14.0)
NEUT%: 40.7 % (ref 38.4–76.8)
NEUTROS ABS: 1.7 10*3/uL (ref 1.5–6.5)
Platelets: 180 10*3/uL (ref 145–400)
RBC: 3.89 10*6/uL (ref 3.70–5.45)
RDW: 16 % — ABNORMAL HIGH (ref 11.2–14.5)
WBC: 4.2 10*3/uL (ref 3.9–10.3)
lymph#: 2.2 10*3/uL (ref 0.9–3.3)

## 2015-05-04 LAB — COMPREHENSIVE METABOLIC PANEL (CC13)
ALBUMIN: 3.7 g/dL (ref 3.5–5.0)
ALT: 14 U/L (ref 0–55)
AST: 16 U/L (ref 5–34)
Alkaline Phosphatase: 75 U/L (ref 40–150)
Anion Gap: 9 mEq/L (ref 3–11)
BUN: 9.4 mg/dL (ref 7.0–26.0)
CO2: 25 mEq/L (ref 22–29)
Calcium: 8.3 mg/dL — ABNORMAL LOW (ref 8.4–10.4)
Chloride: 97 mEq/L — ABNORMAL LOW (ref 98–109)
Creatinine: 0.8 mg/dL (ref 0.6–1.1)
EGFR: 76 mL/min/{1.73_m2} — ABNORMAL LOW (ref >90)
Glucose: 218 mg/dl — ABNORMAL HIGH (ref 70–140)
Potassium: 4.3 mEq/L (ref 3.5–5.1)
Sodium: 131 mEq/L — ABNORMAL LOW (ref 136–145)
Total Bilirubin: 0.49 mg/dL (ref 0.20–1.20)
Total Protein: 6.6 g/dL (ref 6.4–8.3)

## 2015-05-04 MED ORDER — TBO-FILGRASTIM 300 MCG/0.5ML ~~LOC~~ SOSY
300.0000 ug | PREFILLED_SYRINGE | Freq: Once | SUBCUTANEOUS | Status: AC
  Administered 2015-05-04: 300 ug via SUBCUTANEOUS
  Filled 2015-05-04: qty 0.5

## 2015-05-04 MED ORDER — DEXAMETHASONE 4 MG PO TABS: ORAL_TABLET | ORAL | Status: DC

## 2015-05-04 NOTE — Progress Notes (Signed)
OFFICE PROGRESS NOTE   May 04, 2015   Physicians: [name]/ [name], Posey Pronto, Providence Crosby, MD (PCP, [facility]), Aloha Gell  INTERVAL HISTORY:  Patient is seen, alone for visit, in continuing attention to adjuvant chemotherapy in process for IIIA grade 3 serous endometrial carcinoma, with cycle 4 carbo taxol given on 04-27-15,  and to have granix today and 05-05-15.   Patient has tolerated most recent chemotherapy without any major problems. Peripheral neuropathy symptoms were less intense this cycle, only slight in fingers and toes, not interfering with activity. She had low back pain day after chemo, resolved. Bowels are moving daily. GERD is better with daily protonix, which she will continue; she still occasionally feels "tight " in epigastrium after eating,   ONCOLOGIC HISTORY No PAC Did not have flu vaccine  ONCOLOGIC HISTORY Patient presented to Dr [name] 01-03-15 with vaginal spotting x 1 year. Pelvic US showed 33 mm endometrial stripe with ovaries normal, and endometrial biopsy also 01-03-15 reportedly had grade 3 endometrial adenocarcinoma. She was seen in consultation by Dr Denman George on 2-8-1. CT CAP 01-13-15 had negative chest, no adenopathy, mild fatty liver, markedly thickened endometrium and no evidence of metastatic disease outside of uterus. CA 125 preoperatively on 01-13-15 was 22.9. Surgery by Dr [name] 01-19-15 was robotic hysterectomy, BSO and resection of nodule at sigmoid serosa. At surgery there was milial tumor studding with 5-10 mm implants on bladder peritoneum and miliary disease on pelvic sidewalls, without upper abdominal disease evident, and omentum appeared normal. Pathology ([id number]) found IIIA grade 3 serous/ endometrioid endometrial carcinoma with 8 mm / 12 mm myometrial invasion (60%), + LVSI, and extensive serous involvement of sigmoid nodule. She was seen for post operative follow up by Dr Denman George on 02-03-15, with recommendation for 6 cycles of taxol carboplatin, then  consideration of external beam RT + vaginal brachytherapy if CR. First carboplatin taxol was given 02-16-2015; she was neutropenic with ANC 0.7 by day 15 cycle 1.     Review of systems as above, also: No fever or symptoms of infection. No bleeding. Some SOB with exertion, not at rest. Fatigued. Bladder ok. No swelling LE.  Remainder of 10 point Review of Systems negative.  Objective:  Vital signs in last 24 hours:  BP 125/77 mmHg  Pulse 80  Temp(Src) 98 F (36.7 C) (Oral)  Resp 18  Ht _0  (1.549 m)  Wt 120 lb 9.6 oz (54.704 kg)  BMI 22.80 kg/m2  SpO2 100% Weight down 2 lbs Alert, oriented and appropriate. NAD. Very pleasant as always.  Ambulatory without difficulty, wearing sandals Alopecia  HEENT:PERRL, sclerae not icteric. Oral mucosa moist without lesions, posterior pharynx clear.  Neck supple. No JVD.  Lymphatics:no cervical,supraclavicular, axillary or inguinal adenopathy Resp: clear to auscultation bilaterally and normal percussion bilaterally Cardio: regular rate and rhythm. No gallop. GI: soft, nontender, not distended, no mass or organomegaly. Normally active bowel sounds. Surgical incision not remarkable. Musculoskeletal/ Extremities: without pitting edema, cords, tenderness Neuro: no significant peripheral neuropathy. Otherwise nonfocal. Psych appropriate mood and affect Skin without rash, ecchymosis, petechiae   Lab Results:  Results for orders placed or performed in visit on 05/04/15  CBC with Differential  Result Value Ref Range   WBC 4.2 3.9 - 10.3 10e3/uL   NEUT# 1.7 1.5 - 6.5 10e3/uL   HGB 11.7 11.6 - 15.9 g/dL   HCT 33.5 (L) 34.8 - 46.6 %   Platelets 180 145 - 400 10e3/uL   MCV 86.1 79.5 - 101.0 fL   MCH 30.1  25.1 - 34.0 pg   MCHC 34.9 31.5 - 36.0 g/dL   RBC 3.89 3.70 - 5.45 10e6/uL   RDW 16.0 (H) 11.2 - 14.5 %   lymph# 2.2 0.9 - 3.3 10e3/uL   MONO# 0.2 0.1 - 0.9 10e3/uL   Eosinophils Absolute 0.1 0.0 - 0.5 10e3/uL   Basophils Absolute 0.0 0.0  - 0.1 10e3/uL   NEUT% 40.7 38.4 - 76.8 %   LYMPH% 52.3 (H) 14.0 - 49.7 %   MONO% 5.1 0.0 - 14.0 %   EOS% 1.4 0.0 - 7.0 %   BASO% 0.5 0.0 - 2.0 %  Comprehensive metabolic panel (Cmet) - [facility]  Result Value Ref Range   Sodium 131 (L) 136 - 145 mEq/L   Potassium 4.3 3.5 - 5.1 mEq/L   Chloride 97 (L) 98 - 109 mEq/L   CO2 25 22 - 29 mEq/L   Glucose 218 (H) 70 - 140 mg/dl   BUN 9.4 7.0 - 26.0 mg/dL   Creatinine 0.8 0.6 - 1.1 mg/dL   Total Bilirubin 0.49 0.20 - 1.20 mg/dL   Alkaline Phosphatase 75 40 - 150 U/L   AST 16 5 - 34 U/L   ALT 14 0 - 55 U/L   Total Protein 6.6 6.4 - 8.3 g/dL   Albumin 3.7 3.5 - 5.0 g/dL   Calcium 8.3 (L) 8.4 - 10.4 mg/dL   Anion Gap 9 3 - 11 mEq/L   EGFR 76 (L) >90 ml/min/1.73 m2    CBC results discussed during visit.  Studies/Results:  No results found.  Medications: I have reviewed the patient's current medications.  DISCUSSION: granix today and tomorrow. Lots of movement and massage hands and feet.  She will have cycle 5 on 05-18-15 as long as ANC >=1.5 and plt >=100k, with SS insulin coverage day of treatment and granix 300 mg on days 7 and 8. I will see her on 6-23 prior to cycle 6.  Assessment/Plan:  1.IIIA grade 3 serous endometrial carcinoma: residual at least miliary disease at completion of surgery, first carbo taxol 02-16-15. gCSF support since neutropenic after cycle 1. Continue treatment as planned, repeat imaging after 6 cycles. 2.diabetes x 15 years: will check blood sugar and cover with regular insulin on sliding scale with each treatment.  PCP manages diabetes otherwise. 3. Vaginal drainage resolved 4.Hypertension, generally well controlled 5.anemia: B12 ok despite vegetarian diet, iron ok. A little lower but ok, follow.  7.hyponatremia, hypochloremia: likely due to diuretic, present during hospitalization for recent surgery also. A little better today, I believe now drinking G2 gatorade. This information sent to PCP at initial consult. Not  symptomatic. 8.GERD: protonix helping symptoms. 9.minimal chemo peripheral neuropathy  - follow  All questions addressed. Chemo and granix orders confirmed. Time spent 25 min including >50% counseling and coordination of care   Danile Trier P, MD   05/04/2015, 1:46 PM

## 2015-05-04 NOTE — Telephone Encounter (Signed)
appointment made and avs printed for patient

## 2015-05-05 ENCOUNTER — Ambulatory Visit (HOSPITAL_BASED_OUTPATIENT_CLINIC_OR_DEPARTMENT_OTHER): Payer: 59

## 2015-05-05 VITALS — BP 127/66 | HR 79 | Temp 97.9°F

## 2015-05-05 DIAGNOSIS — C541 Malignant neoplasm of endometrium: Secondary | ICD-10-CM | POA: Diagnosis not present

## 2015-05-05 DIAGNOSIS — Z5189 Encounter for other specified aftercare: Secondary | ICD-10-CM | POA: Diagnosis not present

## 2015-05-05 MED ORDER — TBO-FILGRASTIM 300 MCG/0.5ML ~~LOC~~ SOSY
300.0000 ug | PREFILLED_SYRINGE | Freq: Once | SUBCUTANEOUS | Status: AC
  Administered 2015-05-05: 300 ug via SUBCUTANEOUS
  Filled 2015-05-05: qty 0.5

## 2015-05-06 DIAGNOSIS — K219 Gastro-esophageal reflux disease without esophagitis: Secondary | ICD-10-CM | POA: Insufficient documentation

## 2015-05-11 ENCOUNTER — Ambulatory Visit: Payer: 59

## 2015-05-11 ENCOUNTER — Other Ambulatory Visit: Payer: 59

## 2015-05-18 ENCOUNTER — Ambulatory Visit: Payer: 59

## 2015-05-18 ENCOUNTER — Other Ambulatory Visit: Payer: Self-pay | Admitting: Nurse Practitioner

## 2015-05-18 ENCOUNTER — Ambulatory Visit (HOSPITAL_BASED_OUTPATIENT_CLINIC_OR_DEPARTMENT_OTHER): Payer: 59

## 2015-05-18 ENCOUNTER — Other Ambulatory Visit (HOSPITAL_BASED_OUTPATIENT_CLINIC_OR_DEPARTMENT_OTHER): Payer: 59

## 2015-05-18 ENCOUNTER — Other Ambulatory Visit: Payer: Self-pay | Admitting: Oncology

## 2015-05-18 VITALS — BP 143/78 | HR 92 | Temp 98.0°F

## 2015-05-18 DIAGNOSIS — E119 Type 2 diabetes mellitus without complications: Secondary | ICD-10-CM

## 2015-05-18 DIAGNOSIS — C541 Malignant neoplasm of endometrium: Secondary | ICD-10-CM

## 2015-05-18 LAB — COMPREHENSIVE METABOLIC PANEL (CC13)
ALK PHOS: 61 U/L (ref 40–150)
ALT: 20 U/L (ref 0–55)
AST: 15 U/L (ref 5–34)
Albumin: 3.8 g/dL (ref 3.5–5.0)
Anion Gap: 12 mEq/L — ABNORMAL HIGH (ref 3–11)
BILIRUBIN TOTAL: 0.44 mg/dL (ref 0.20–1.20)
BUN: 14.1 mg/dL (ref 7.0–26.0)
CO2: 20 mEq/L — ABNORMAL LOW (ref 22–29)
CREATININE: 1 mg/dL (ref 0.6–1.1)
Calcium: 8.9 mg/dL (ref 8.4–10.4)
Chloride: 98 mEq/L (ref 98–109)
EGFR: 61 mL/min/{1.73_m2} — ABNORMAL LOW (ref >90)
Glucose: 385 mg/dl — ABNORMAL HIGH (ref 70–140)
POTASSIUM: 4.9 meq/L (ref 3.5–5.1)
Sodium: 129 mEq/L — ABNORMAL LOW (ref 136–145)
Total Protein: 6.7 g/dL (ref 6.4–8.3)

## 2015-05-18 LAB — CBC WITH DIFFERENTIAL/PLATELET
BASO%: 0 % (ref 0.0–2.0)
Basophils Absolute: 0 10*3/uL (ref 0.0–0.1)
EOS%: 0 % (ref 0.0–7.0)
Eosinophils Absolute: 0 10*3/uL (ref 0.0–0.5)
HEMATOCRIT: 29.4 % — AB (ref 34.8–46.6)
HGB: 10.1 g/dL — ABNORMAL LOW (ref 11.6–15.9)
LYMPH%: 7.9 % — AB (ref 14.0–49.7)
MCH: 29.2 pg (ref 25.1–34.0)
MCHC: 34.4 g/dL (ref 31.5–36.0)
MCV: 85 fL (ref 79.5–101.0)
MONO#: 0 10*3/uL — AB (ref 0.1–0.9)
MONO%: 0.3 % (ref 0.0–14.0)
NEUT#: 9.2 10*3/uL — ABNORMAL HIGH (ref 1.5–6.5)
NEUT%: 91.8 % — AB (ref 38.4–76.8)
PLATELETS: 97 10*3/uL — AB (ref 145–400)
RBC: 3.46 10*6/uL — AB (ref 3.70–5.45)
RDW: 15.6 % — ABNORMAL HIGH (ref 11.2–14.5)
WBC: 10 10*3/uL (ref 3.9–10.3)
lymph#: 0.8 10*3/uL — ABNORMAL LOW (ref 0.9–3.3)

## 2015-05-18 LAB — WHOLE BLOOD GLUCOSE
Glucose: 372 mg/dL — ABNORMAL HIGH (ref 70–100)
HRS PC: 1 Hours

## 2015-05-18 MED ORDER — INSULIN REGULAR HUMAN 100 UNIT/ML IJ SOLN
8.0000 [IU] | Freq: Once | INTRAMUSCULAR | Status: AC
  Administered 2015-05-18: 8 [IU] via SUBCUTANEOUS
  Filled 2015-05-18: qty 0.08

## 2015-05-18 NOTE — Progress Notes (Signed)
Chemo held today due to drop in platelets (97) per Dr Marko Plume.  Pt seen in infusion room, VSS, pt feeling well.  Glucose 385 today, per Dr Marko Plume ok to give insulin per sliding scale, 8 units given.  Pt to return next week for labs/md visit/chemo. No injection appointment 6/22 necessary per Dr Marko Plume.  Pt instructed to watch out for signs of brusing/bleeding.  Pt and daughter verbalized understanding.  Pt discharged to home.

## 2015-05-19 ENCOUNTER — Ambulatory Visit: Payer: 59

## 2015-05-19 ENCOUNTER — Telehealth: Payer: Self-pay | Admitting: Oncology

## 2015-05-19 NOTE — Telephone Encounter (Signed)
Spoke with patient and she is aware of her 6/23-6/24 appointments

## 2015-05-21 ENCOUNTER — Other Ambulatory Visit: Payer: Self-pay | Admitting: Oncology

## 2015-05-24 ENCOUNTER — Ambulatory Visit: Payer: 59

## 2015-05-25 ENCOUNTER — Telehealth: Payer: Self-pay | Admitting: Oncology

## 2015-05-25 ENCOUNTER — Other Ambulatory Visit: Payer: 59

## 2015-05-25 ENCOUNTER — Ambulatory Visit (HOSPITAL_BASED_OUTPATIENT_CLINIC_OR_DEPARTMENT_OTHER): Payer: 59 | Admitting: Oncology

## 2015-05-25 ENCOUNTER — Ambulatory Visit: Payer: 59 | Admitting: Oncology

## 2015-05-25 ENCOUNTER — Other Ambulatory Visit: Payer: Self-pay | Admitting: Nurse Practitioner

## 2015-05-25 ENCOUNTER — Encounter: Payer: Self-pay | Admitting: Oncology

## 2015-05-25 ENCOUNTER — Other Ambulatory Visit (HOSPITAL_BASED_OUTPATIENT_CLINIC_OR_DEPARTMENT_OTHER): Payer: 59

## 2015-05-25 ENCOUNTER — Ambulatory Visit: Payer: 59

## 2015-05-25 VITALS — BP 159/67 | HR 78 | Temp 98.1°F | Resp 18 | Ht 61.0 in | Wt 121.0 lb

## 2015-05-25 DIAGNOSIS — C55 Malignant neoplasm of uterus, part unspecified: Secondary | ICD-10-CM

## 2015-05-25 DIAGNOSIS — E119 Type 2 diabetes mellitus without complications: Secondary | ICD-10-CM | POA: Diagnosis not present

## 2015-05-25 DIAGNOSIS — D701 Agranulocytosis secondary to cancer chemotherapy: Secondary | ICD-10-CM

## 2015-05-25 DIAGNOSIS — G622 Polyneuropathy due to other toxic agents: Secondary | ICD-10-CM | POA: Diagnosis not present

## 2015-05-25 DIAGNOSIS — D649 Anemia, unspecified: Secondary | ICD-10-CM

## 2015-05-25 DIAGNOSIS — D6481 Anemia due to antineoplastic chemotherapy: Secondary | ICD-10-CM

## 2015-05-25 DIAGNOSIS — E878 Other disorders of electrolyte and fluid balance, not elsewhere classified: Secondary | ICD-10-CM

## 2015-05-25 DIAGNOSIS — C541 Malignant neoplasm of endometrium: Secondary | ICD-10-CM

## 2015-05-25 DIAGNOSIS — E871 Hypo-osmolality and hyponatremia: Secondary | ICD-10-CM

## 2015-05-25 DIAGNOSIS — K219 Gastro-esophageal reflux disease without esophagitis: Secondary | ICD-10-CM

## 2015-05-25 DIAGNOSIS — G620 Drug-induced polyneuropathy: Secondary | ICD-10-CM

## 2015-05-25 DIAGNOSIS — T451X5A Adverse effect of antineoplastic and immunosuppressive drugs, initial encounter: Secondary | ICD-10-CM

## 2015-05-25 LAB — CBC WITH DIFFERENTIAL/PLATELET
BASO%: 0.8 % (ref 0.0–2.0)
BASOS ABS: 0 10*3/uL (ref 0.0–0.1)
EOS ABS: 0.1 10*3/uL (ref 0.0–0.5)
EOS%: 1.1 % (ref 0.0–7.0)
HCT: 30.4 % — ABNORMAL LOW (ref 34.8–46.6)
HEMOGLOBIN: 10.4 g/dL — AB (ref 11.6–15.9)
LYMPH#: 1.9 10*3/uL (ref 0.9–3.3)
LYMPH%: 38 % (ref 14.0–49.7)
MCH: 30.1 pg (ref 25.1–34.0)
MCHC: 34.2 g/dL (ref 31.5–36.0)
MCV: 88 fL (ref 79.5–101.0)
MONO#: 0.6 10*3/uL (ref 0.1–0.9)
MONO%: 12.3 % (ref 0.0–14.0)
NEUT%: 47.8 % (ref 38.4–76.8)
NEUTROS ABS: 2.4 10*3/uL (ref 1.5–6.5)
Platelets: 178 10*3/uL (ref 145–400)
RBC: 3.46 10*6/uL — ABNORMAL LOW (ref 3.70–5.45)
RDW: 17.4 % — AB (ref 11.2–14.5)
WBC: 5.1 10*3/uL (ref 3.9–10.3)

## 2015-05-25 LAB — COMPREHENSIVE METABOLIC PANEL (CC13)
ALBUMIN: 3.9 g/dL (ref 3.5–5.0)
ALT: 15 U/L (ref 0–55)
AST: 13 U/L (ref 5–34)
Alkaline Phosphatase: 63 U/L (ref 40–150)
Anion Gap: 9 mEq/L (ref 3–11)
BILIRUBIN TOTAL: 0.36 mg/dL (ref 0.20–1.20)
BUN: 9.7 mg/dL (ref 7.0–26.0)
CHLORIDE: 101 meq/L (ref 98–109)
CO2: 25 mEq/L (ref 22–29)
CREATININE: 0.8 mg/dL (ref 0.6–1.1)
Calcium: 9.1 mg/dL (ref 8.4–10.4)
EGFR: 83 mL/min/{1.73_m2} — AB (ref >90)
Glucose: 136 mg/dl (ref 70–140)
Potassium: 4.5 mEq/L (ref 3.5–5.1)
Sodium: 135 mEq/L — ABNORMAL LOW (ref 136–145)
Total Protein: 6.7 g/dL (ref 6.4–8.3)

## 2015-05-25 LAB — WHOLE BLOOD GLUCOSE: Glucose: 133 mg/dL — ABNORMAL HIGH (ref 70–100)

## 2015-05-25 MED ORDER — DEXAMETHASONE 4 MG PO TABS: ORAL_TABLET | ORAL | Status: DC

## 2015-05-25 NOTE — Telephone Encounter (Signed)
Gave avs & calendar for July °

## 2015-05-25 NOTE — Progress Notes (Signed)
OFFICE PROGRESS NOTE   May 25, 2015   Physicians:[name]/ Laurine Blazer, Providence Crosby, MD (PCP, [facility]), Aloha Gell  INTERVAL HISTORY:  Patient is seen, together with daughter, in continuing attention to adjuvant chemotherapy in process for IIIA grade 3 serous endometrial carcinoma. Cycle 5 was delayed a week from 05-18-15 due to slightly low platelets, counts back up today and plan for cycle 5 to be given on 6-24. As cycle 4 was also delayed for thrombocytopenia, will decrease AUC again for treatment on 6-24.  Will repeat scans and have her see gyn onc after completion of chemo, for consideration of pelvic radiation and vaginal brachytherapy if CR. She was transfused 2 units PRBCs 5-19/ 04-21-15 for Hgb 7.8. She needs granix on days 7 and 8, delay due to taxol neuropathy.  Patient is otherwise tolerating chemo generally well. She has mild neuropathy in feet bilaterally, noticeable but not interfering with walking and feels better when she is active; she has minimal tingling in fingers. She denies nausea or vomiting, and bowels are moving well. She had no bleeding when platelets were low. Energy and appetite are good. Blood sugars were ~ 300 with last premed decadron, took ~ 2 days to go back to normal, but in usual range since then.    No PAC Did not have flu vaccine  Other daughter is visiting from [place].  ONCOLOGIC HISTORY Patient presented to Dr [name] 01-03-15 with vaginal spotting x 1 year. Pelvic US showed 33 mm endometrial stripe with ovaries normal, and endometrial biopsy also 01-03-15 reportedly had grade 3 endometrial adenocarcinoma. She was seen in consultation by Dr Denman George on 2-8-1. CT CAP 01-13-15 had negative chest, no adenopathy, mild fatty liver, markedly thickened endometrium and no evidence of metastatic disease outside of uterus. CA 125 preoperatively on 01-13-15 was 22.9. Surgery by Dr [name] 01-19-15 was robotic hysterectomy, BSO and resection of nodule at sigmoid serosa. At  surgery there was milial tumor studding with 5-10 mm implants on bladder peritoneum and miliary disease on pelvic sidewalls, without upper abdominal disease evident, and omentum appeared normal. 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 She was seen for post operative follow up by Dr Denman George on 02-03-15, with recommendation for 6 cycles of taxol carboplatin, then consideration of external beam RT + vaginal brachytherapy if CR. First carboplatin taxol was given 02-16-2015; she was neutropenic with ANC 0.7 by day 15 cycle 1. She was transfused 2 units PRBCs 5-19/5-20-16 for Hgb 7.8. Cycles 4 and 5 were both delayed due to thrombocytopenia, carbo AUC adjusted.      Review of systems as above, also: No fever or symptoms of infection. No SOB. No pain. Bladder ok. Sleeping well Remainder of 10 point Review of Systems negative.  Objective:  Vital signs in last 24 hours:  BP 159/67 mmHg  Pulse 78  Temp(Src) 98.1 F (36.7 C) (Oral)  Resp 18  Ht 5\' 1"  (1.549 m)  Wt 121 lb (54.885 kg)  BMI 22.87 kg/m2  SpO2 100% Weight stable. Respirations not labored RA. Looks comfortable Alert, oriented and appropriate. Ambulatory without difficulty.  Alopecia  HEENT:PERRL, sclerae not icteric. Oral mucosa moist without lesions, posterior pharynx clear.  Neck supple. No JVD.  Lymphatics:no cervical,supraclavicular, axillary or inguinal adenopathy Resp: clear to auscultation bilaterally and normal percussion bilaterally Cardio: regular rate and rhythm. No gallop. GI: soft, nontender, not distended, no mass or organomegaly.  Normally active bowel sounds. Surgical incisions not remarkable. Musculoskeletal/ Extremities: without pitting edema, cords, tenderness Neuro: minimal peripheral neuropathy as noted. Otherwise nonfocal. Psych appropriate mood and affect Skin  without rash, ecchymosis, petechiae Sites of IV access UE nothing of concern  Lab Results:  Results for orders placed or performed in visit on 05/25/15  CBC with Differential  Result Value Ref Range   WBC 5.1 3.9 - 10.3 10e3/uL   NEUT# 2.4 1.5 - 6.5 10e3/uL   HGB 10.4 (L) 11.6 - 15.9 g/dL   HCT 30.4 (L) 34.8 - 46.6 %   Platelets 178 145 - 400 10e3/uL   MCV 88.0 79.5 - 101.0 fL   MCH 30.1 25.1 - 34.0 pg   MCHC 34.2 31.5 - 36.0 g/dL   RBC 3.46 (L) 3.70 - 5.45 10e6/uL   RDW 17.4 (H) 11.2 - 14.5 %   lymph# 1.9 0.9 - 3.3 10e3/uL   MONO# 0.6 0.1 - 0.9 10e3/uL   Eosinophils Absolute 0.1 0.0 - 0.5 10e3/uL   Basophils Absolute 0.0 0.0 - 0.1 10e3/uL   NEUT% 47.8 38.4 - 76.8 %   LYMPH% 38.0 14.0 - 49.7 %   MONO% 12.3 0.0 - 14.0 %   EOS% 1.1 0.0 - 7.0 %   BASO% 0.8 0.0 - 2.0 %    CMET availaable after visit Na 135, creat 0.8 and otherwise WNL  Studies/Results:  No results found.  Medications: I have reviewed the patient's current medications. Carbo dose decreased from AUC =5 used for cycle 4 to AUC = 4.5 for cycle 5 planned 05-26-15  DISCUSSION: taxol neuropathy discussed, with DM likely contributing. Still fairly minimal and not bothersome, so will give taxol as planned cycle 5. NOTE if neuropathy progresses significantly may need to decrease or omit taxol cycle 6.  Assessment/Plan:   1.IIIA grade 3 serous endometrial carcinoma: residual at least miliary disease at completion of surgery, first carbo taxol 02-16-15. gCSF support since neutropenic after cycle 1. Continue treatment as planned, repeat imaging after 6 cycles. 2.diabetes x 15 years: will check blood sugar and cover with regular insulin on sliding scale with each treatment. PCP manages diabetes otherwise. 3. Vaginal drainage resolved 4.Hypertension, generally well controlled 5.anemia: B12 ok despite vegetarian diet, iron ok. Transfused 2 units PRBCs mid May 7.hyponatremia, hypochloremia: likely due to diuretic, present  during hospitalization for recent surgery also. Better today, with blood sugar also ok off steroids today. Not symptomatic. 8.GERD: protonix helping symptoms. 9.minimal chemo peripheral neuropathy - may need to adjust taxol cycle 6. Encouraged lots of activity hands and feet.   All questions answered. Scheduling revised with treatment delay. Chemo orders adjusted, granix orders confirmed.  Time spent 25 min including >50% counseling and coordination of care.    LIVESAY,LENNIS P, MD   05/25/2015, 8:27 AM

## 2015-05-26 ENCOUNTER — Other Ambulatory Visit: Payer: Self-pay | Admitting: Oncology

## 2015-05-26 ENCOUNTER — Other Ambulatory Visit (HOSPITAL_BASED_OUTPATIENT_CLINIC_OR_DEPARTMENT_OTHER): Payer: 59

## 2015-05-26 ENCOUNTER — Ambulatory Visit (HOSPITAL_BASED_OUTPATIENT_CLINIC_OR_DEPARTMENT_OTHER): Payer: 59

## 2015-05-26 ENCOUNTER — Ambulatory Visit: Payer: 59

## 2015-05-26 VITALS — BP 131/72 | HR 113 | Temp 97.5°F | Resp 18

## 2015-05-26 DIAGNOSIS — C541 Malignant neoplasm of endometrium: Secondary | ICD-10-CM

## 2015-05-26 DIAGNOSIS — E119 Type 2 diabetes mellitus without complications: Secondary | ICD-10-CM

## 2015-05-26 DIAGNOSIS — Z5111 Encounter for antineoplastic chemotherapy: Secondary | ICD-10-CM

## 2015-05-26 LAB — WHOLE BLOOD GLUCOSE
GLUCOSE: 203 mg/dL — AB (ref 70–100)
Glucose: 324 mg/dL — ABNORMAL HIGH (ref 70–100)
HRS PC: 1 Hours
HRS PC: 0.5 Hours

## 2015-05-26 MED ORDER — HEPARIN SOD (PORK) LOCK FLUSH 100 UNIT/ML IV SOLN
500.0000 [IU] | Freq: Once | INTRAVENOUS | Status: DC | PRN
  Filled 2015-05-26: qty 5

## 2015-05-26 MED ORDER — INSULIN REGULAR HUMAN 100 UNIT/ML IJ SOLN
2.0000 [IU] | Freq: Once | INTRAMUSCULAR | Status: AC
  Administered 2015-05-26: 4 [IU] via SUBCUTANEOUS
  Filled 2015-05-26: qty 0.08

## 2015-05-26 MED ORDER — CARBOPLATIN CHEMO INJECTION 600 MG/60ML
400.0000 mg | Freq: Once | INTRAVENOUS | Status: AC
  Administered 2015-05-26: 400 mg via INTRAVENOUS
  Filled 2015-05-26: qty 40

## 2015-05-26 MED ORDER — SODIUM CHLORIDE 0.9 % IV SOLN
Freq: Once | INTRAVENOUS | Status: AC
  Administered 2015-05-26: 11:00:00 via INTRAVENOUS

## 2015-05-26 MED ORDER — DIPHENHYDRAMINE HCL 50 MG/ML IJ SOLN
50.0000 mg | Freq: Once | INTRAMUSCULAR | Status: AC
  Administered 2015-05-26: 50 mg via INTRAVENOUS

## 2015-05-26 MED ORDER — INSULIN REGULAR HUMAN 100 UNIT/ML IJ SOLN
8.0000 [IU] | Freq: Once | INTRAMUSCULAR | Status: AC | PRN
  Administered 2015-05-26: 8 [IU] via SUBCUTANEOUS
  Filled 2015-05-26: qty 0.08

## 2015-05-26 MED ORDER — FAMOTIDINE IN NACL 20-0.9 MG/50ML-% IV SOLN
20.0000 mg | Freq: Once | INTRAVENOUS | Status: AC
  Administered 2015-05-26: 20 mg via INTRAVENOUS

## 2015-05-26 MED ORDER — SODIUM CHLORIDE 0.9 % IV SOLN
Freq: Once | INTRAVENOUS | Status: AC
  Administered 2015-05-26: 11:00:00 via INTRAVENOUS
  Filled 2015-05-26: qty 8

## 2015-05-26 MED ORDER — FAMOTIDINE IN NACL 20-0.9 MG/50ML-% IV SOLN
INTRAVENOUS | Status: AC
  Filled 2015-05-26: qty 50

## 2015-05-26 MED ORDER — DIPHENHYDRAMINE HCL 50 MG/ML IJ SOLN
INTRAMUSCULAR | Status: AC
Start: 2015-05-26 — End: 2015-05-26
  Filled 2015-05-26: qty 1

## 2015-05-26 MED ORDER — PACLITAXEL CHEMO INJECTION 300 MG/50ML
135.0000 mg/m2 | Freq: Once | INTRAVENOUS | Status: AC
  Administered 2015-05-26: 210 mg via INTRAVENOUS
  Filled 2015-05-26: qty 35

## 2015-05-26 MED ORDER — SODIUM CHLORIDE 0.9 % IJ SOLN
10.0000 mL | INTRAMUSCULAR | Status: DC | PRN
  Filled 2015-05-26: qty 10

## 2015-05-26 NOTE — Patient Instructions (Signed)
[facility] Discharge Instructions for Patients Receiving Chemotherapy  Today you received the following chemotherapy agents:  Taxol and Carboplatin  To help prevent nausea and vomiting after your treatment, we encourage you to take your nausea medication as ordered per MD.   If you develop nausea and vomiting that is not controlled by your nausea medication, call the clinic.   BELOW ARE SYMPTOMS THAT SHOULD BE REPORTED IMMEDIATELY:  *FEVER GREATER THAN 100.5 F  *CHILLS WITH OR WITHOUT FEVER  NAUSEA AND VOMITING THAT IS NOT CONTROLLED WITH YOUR NAUSEA MEDICATION  *UNUSUAL SHORTNESS OF BREATH  *UNUSUAL BRUISING OR BLEEDING  TENDERNESS IN MOUTH AND THROAT WITH OR WITHOUT PRESENCE OF ULCERS  *URINARY PROBLEMS  *BOWEL PROBLEMS  UNUSUAL RASH Items with * indicate a potential emergency and should be followed up as soon as possible.  Feel free to call the clinic you have any questions or concerns. The clinic phone number is [phone].  Please show the CHEMO ALERT CARD at check-in to the Emergency Department and triage nurse.   

## 2015-06-02 ENCOUNTER — Ambulatory Visit (HOSPITAL_BASED_OUTPATIENT_CLINIC_OR_DEPARTMENT_OTHER): Payer: 59

## 2015-06-02 VITALS — BP 115/70 | HR 91 | Temp 98.5°F

## 2015-06-02 DIAGNOSIS — C541 Malignant neoplasm of endometrium: Secondary | ICD-10-CM

## 2015-06-02 DIAGNOSIS — Z5189 Encounter for other specified aftercare: Secondary | ICD-10-CM | POA: Diagnosis not present

## 2015-06-02 MED ORDER — TBO-FILGRASTIM 300 MCG/0.5ML ~~LOC~~ SOSY
300.0000 ug | PREFILLED_SYRINGE | Freq: Once | SUBCUTANEOUS | Status: AC
  Administered 2015-06-02: 300 ug via SUBCUTANEOUS
  Filled 2015-06-02: qty 0.5

## 2015-06-03 ENCOUNTER — Ambulatory Visit (HOSPITAL_BASED_OUTPATIENT_CLINIC_OR_DEPARTMENT_OTHER): Payer: 59

## 2015-06-03 VITALS — BP 118/60 | HR 86 | Temp 97.1°F

## 2015-06-03 DIAGNOSIS — C541 Malignant neoplasm of endometrium: Secondary | ICD-10-CM | POA: Diagnosis not present

## 2015-06-03 DIAGNOSIS — Z5189 Encounter for other specified aftercare: Secondary | ICD-10-CM | POA: Diagnosis not present

## 2015-06-03 MED ORDER — TBO-FILGRASTIM 300 MCG/0.5ML ~~LOC~~ SOSY
300.0000 ug | PREFILLED_SYRINGE | Freq: Once | SUBCUTANEOUS | Status: AC
  Administered 2015-06-03: 300 ug via SUBCUTANEOUS

## 2015-06-03 NOTE — Patient Instructions (Signed)

## 2015-06-08 ENCOUNTER — Other Ambulatory Visit: Payer: 59

## 2015-06-08 ENCOUNTER — Ambulatory Visit: Payer: 59

## 2015-06-14 ENCOUNTER — Other Ambulatory Visit: Payer: Self-pay | Admitting: Oncology

## 2015-06-15 ENCOUNTER — Telehealth: Payer: Self-pay | Admitting: Nurse Practitioner

## 2015-06-15 ENCOUNTER — Ambulatory Visit: Payer: 59

## 2015-06-15 ENCOUNTER — Encounter: Payer: Self-pay | Admitting: Oncology

## 2015-06-15 ENCOUNTER — Ambulatory Visit (HOSPITAL_BASED_OUTPATIENT_CLINIC_OR_DEPARTMENT_OTHER): Payer: 59 | Admitting: Oncology

## 2015-06-15 ENCOUNTER — Telehealth: Payer: Self-pay | Admitting: Oncology

## 2015-06-15 ENCOUNTER — Other Ambulatory Visit (HOSPITAL_BASED_OUTPATIENT_CLINIC_OR_DEPARTMENT_OTHER): Payer: 59

## 2015-06-15 VITALS — BP 151/91 | HR 79 | Temp 97.4°F | Resp 18 | Ht 61.0 in | Wt 121.5 lb

## 2015-06-15 DIAGNOSIS — E119 Type 2 diabetes mellitus without complications: Secondary | ICD-10-CM

## 2015-06-15 DIAGNOSIS — D709 Neutropenia, unspecified: Secondary | ICD-10-CM

## 2015-06-15 DIAGNOSIS — T451X5A Adverse effect of antineoplastic and immunosuppressive drugs, initial encounter: Secondary | ICD-10-CM

## 2015-06-15 DIAGNOSIS — K219 Gastro-esophageal reflux disease without esophagitis: Secondary | ICD-10-CM

## 2015-06-15 DIAGNOSIS — C541 Malignant neoplasm of endometrium: Secondary | ICD-10-CM

## 2015-06-15 DIAGNOSIS — G622 Polyneuropathy due to other toxic agents: Secondary | ICD-10-CM | POA: Diagnosis not present

## 2015-06-15 DIAGNOSIS — I10 Essential (primary) hypertension: Secondary | ICD-10-CM

## 2015-06-15 DIAGNOSIS — G620 Drug-induced polyneuropathy: Secondary | ICD-10-CM

## 2015-06-15 DIAGNOSIS — D6481 Anemia due to antineoplastic chemotherapy: Secondary | ICD-10-CM

## 2015-06-15 DIAGNOSIS — D701 Agranulocytosis secondary to cancer chemotherapy: Secondary | ICD-10-CM

## 2015-06-15 DIAGNOSIS — D649 Anemia, unspecified: Secondary | ICD-10-CM

## 2015-06-15 LAB — COMPREHENSIVE METABOLIC PANEL (CC13)
ALBUMIN: 3.9 g/dL (ref 3.5–5.0)
ALT: 15 U/L (ref 0–55)
AST: 16 U/L (ref 5–34)
Alkaline Phosphatase: 59 U/L (ref 40–150)
Anion Gap: 8 mEq/L (ref 3–11)
BILIRUBIN TOTAL: 0.41 mg/dL (ref 0.20–1.20)
BUN: 7.3 mg/dL (ref 7.0–26.0)
CO2: 24 mEq/L (ref 22–29)
Calcium: 9.4 mg/dL (ref 8.4–10.4)
Chloride: 103 mEq/L (ref 98–109)
Creatinine: 0.8 mg/dL (ref 0.6–1.1)
EGFR: 81 mL/min/{1.73_m2} — ABNORMAL LOW (ref >90)
GLUCOSE: 165 mg/dL — AB (ref 70–140)
Potassium: 4.7 mEq/L (ref 3.5–5.1)
Sodium: 135 mEq/L — ABNORMAL LOW (ref 136–145)
Total Protein: 6.7 g/dL (ref 6.4–8.3)

## 2015-06-15 LAB — CBC WITH DIFFERENTIAL/PLATELET
BASO%: 0.6 % (ref 0.0–2.0)
Basophils Absolute: 0 10*3/uL (ref 0.0–0.1)
EOS%: 0.3 % (ref 0.0–7.0)
Eosinophils Absolute: 0 10*3/uL (ref 0.0–0.5)
HCT: 26.7 % — ABNORMAL LOW (ref 34.8–46.6)
HGB: 9 g/dL — ABNORMAL LOW (ref 11.6–15.9)
LYMPH#: 1.4 10*3/uL (ref 0.9–3.3)
LYMPH%: 39.2 % (ref 14.0–49.7)
MCH: 30.5 pg (ref 25.1–34.0)
MCHC: 33.7 g/dL (ref 31.5–36.0)
MCV: 90.5 fL (ref 79.5–101.0)
MONO#: 0.4 10*3/uL (ref 0.1–0.9)
MONO%: 11.6 % (ref 0.0–14.0)
NEUT#: 1.7 10*3/uL (ref 1.5–6.5)
NEUT%: 48.3 % (ref 38.4–76.8)
PLATELETS: 103 10*3/uL — AB (ref 145–400)
RBC: 2.95 10*6/uL — ABNORMAL LOW (ref 3.70–5.45)
RDW: 16.9 % — AB (ref 11.2–14.5)
WBC: 3.6 10*3/uL — AB (ref 3.9–10.3)

## 2015-06-15 NOTE — Telephone Encounter (Signed)
Per MD note below, patient has 3 existing refills ordered for hemocyte and this has been verified with [facility] in [place]. RN requested pharmacy to prepare medication for patient pick up. Patient informed she has 3 months remaining and pharmacy is preparing medication now for pick up. She verbalizes understanding.

## 2015-06-15 NOTE — Patient Instructions (Signed)
Take Vitamin B 12    ~250 mcg daily Continue iron Continue Protonix for acid reflux daily  Try more arch support in your shoes, instead of flat sandals or flat bedroom slippers

## 2015-06-15 NOTE — Telephone Encounter (Signed)
-----   Message from [name], MD sent at 06/15/2015  9:53 AM EDT ----- Please refill Hemocyte/ ferrous fumarate  X 3 months

## 2015-06-15 NOTE — Telephone Encounter (Signed)
Gave avs & calendar for August. Also gave contrast and ct scan instructions.

## 2015-06-15 NOTE — Progress Notes (Signed)
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  She will have CT on 07-07-15 and see Dr Denman George on 07-17-15; pelvic radiation and vaginal brachytherapy are to be considered if CR after completion of chemo.   Cycle 5 carbo taxol was given on 05-26-15 with granix 300 x 2 following. Patient continues to tolerate treatment generally well, tho does have some peripheral neuropathy and is more anemic. She has had no nausea, bowels are moving regularly and occasionally are loose without frank diarrhea, GERD much better now with daily protonix, appetite ok. She has numbness tips of fingers which is not interfering with activity, and soles of feet particularly distal feet and toes "like walking on foam", not painful and not interfering with ambulation. She has some pain at arch of foot adjacent to heel, discussed better arch support as she wears flat slippers at home. She denies increased SOB or increased fatigue, is going about regular activities at home. No abdominal or pelvic pain, no bleeding.  She was transfused PRBCs in May. She is on oral iron, but only B12 is in multivitamin now, dose not known at time of th. Is lifelong vegetarian, had previously been on B12 supplement with 250 mcg daily.    No PAC No genetics testing  ONCOLOGIC HISTORY Patient presented to Dr [name] 01-03-15 with vaginal spotting x 1 year. Pelvic US showed 33 mm endometrial stripe with ovaries normal, and endometrial biopsy also 01-03-15 reportedly had grade 3 endometrial adenocarcinoma. She was seen in consultation by Dr Denman George on 2-8-1. CT CAP 01-13-15 had negative chest, no adenopathy, mild fatty liver, markedly thickened  endometrium and no evidence of metastatic disease outside of uterus. CA 125 preoperatively on 01-13-15 was 22.9. Surgery by Dr [name] 01-19-15 was robotic hysterectomy, BSO and resection of nodule at sigmoid serosa. At surgery there was milial tumor studding with 5-10 mm implants on bladder peritoneum and miliary disease on pelvic sidewalls, without upper abdominal disease evident, and omentum appeared normal. Pathology ([id number]) found IIIA grade 3 serous/ endometrioid endometrial carcinoma with 8 mm / 12 mm myometrial invasion (60%), + LVSI, and extensive serous involvement of sigmoid nodule. She was seen for post operative follow up by Dr Denman George on 02-03-15, with recommendation for 6 cycles of taxol carboplatin, then consideration of external beam RT + vaginal brachytherapy if CR. First carboplatin taxol was given 02-16-2015; she was neutropenic with ANC 0.7 by day 15 cycle 1. She was transfused 2 units PRBCs 5-19/5-20-16 for Hgb 7.8. Cycles 4 and 5 were both delayed due to thrombocytopenia, carbo AUC adjusted.    Review of systems as above, also: No bleeding. No LE swelling or pain otherwise. No cough.  Remainder of 10 point Review of Systems negative.  Objective:  Vital signs in last 24 hours:  BP 151/91 mmHg  Pulse 79  Temp(Src) 97.4 F (36.3 C) (Oral)  Resp 18  Ht _0  (1.549 m)  Wt 121 lb 8 oz (55.112 kg)  BMI 22.97 kg/m2 Weight up 0.5 lb Alert, oriented and appropriate. Ambulatory without difficulty, wearing clogs Alopecia  HEENT:PERRL, sclerae not icteric. Oral mucosa moist without lesions, posterior pharynx clear.  Neck supple. No JVD.  Lymphatics:no cervical,supraclavicular or inguinal adenopathy Resp: clear  to auscultation bilaterally and normal percussion bilaterally Cardio: regular rate and rhythm. No gallop. GI: soft, nontender, not distended, no mass or organomegaly. Normally active bowel sounds. Surgical incisions not remarkable other than slight firmness out laterally  from LUQ incision for ~ 3cm, no heat or erythema, not tender. Musculoskeletal/ Extremities: without pitting edema, cords, tenderness Neuro: minimal peripheral neuropathy distal feet and tips of fingers. Otherwise nonfocal. PSYCH appropriate mood and affect Skin without rash, ecchymosis, petechiae   Lab Results:  Results for orders placed or performed in visit on 06/15/15  CBC with Differential  Result Value Ref Range   WBC 3.6 (L) 3.9 - 10.3 10e3/uL   NEUT# 1.7 1.5 - 6.5 10e3/uL   HGB 9.0 (L) 11.6 - 15.9 g/dL   HCT 26.7 (L) 34.8 - 46.6 %   Platelets 103 (L) 145 - 400 10e3/uL   MCV 90.5 79.5 - 101.0 fL   MCH 30.5 25.1 - 34.0 pg   MCHC 33.7 31.5 - 36.0 g/dL   RBC 2.95 (L) 3.70 - 5.45 10e6/uL   RDW 16.9 (H) 11.2 - 14.5 %   lymph# 1.4 0.9 - 3.3 10e3/uL   MONO# 0.4 0.1 - 0.9 10e3/uL   Eosinophils Absolute 0.0 0.0 - 0.5 10e3/uL   Basophils Absolute 0.0 0.0 - 0.1 10e3/uL   NEUT% 48.3 38.4 - 76.8 %   LYMPH% 39.2 14.0 - 49.7 %   MONO% 11.6 0.0 - 14.0 %   EOS% 0.3 0.0 - 7.0 %   BASO% 0.6 0.0 - 2.0 %   CMET available after visit Na 135, Glu nonfasting 165, EGFR calculated 81 with creatinine 0.8  Studies/Results:  No results found.  Medications: I have reviewed the patient's current medications. Continue daily protonix. Continue oral iron. Resume B12 at 250 mcg daily in addition to multivitamin  DISCUSSION:  Taxol peripheral neuropathy reviewed, in setting of diabetes. With present mild symptoms and oncologic indication for the taxol, I recommend continuing for last cycle as planned. She understands that peripheral neuropathy may increase with another treatemtn, usually progrressively improves and may completely resolve after completing chemo, tho at times the neuropathy does not resolve. She is in agreement with continuing treatment for cycle 6. Anemia discussed. Hgb not low enough and not symptomatic enough for PRBC transfusion now. She will resume additional B12 and we will follow up  labs at my visit on 7-25.  Assessment/Plan:  1.IIIA grade 3 serous endometrial carcinoma: residual at least miliary disease at completion of surgery, first carbo taxol 02-16-15. gCSF support since neutropenic after cycle 1. Continue treatment as planned, repeat imaging after 6 cycles and follow up with Dr Denman George then. I have requested consultation with Dr Sondra Come shortly after Dr Serita Grit appointment, tho if that is not correct based on restaging evaluation will need to let RT know 2.diabetes x 15 years: check blood sugar and cover with regular insulin on sliding scale with each treatment. PCP manages diabetes otherwise. 3. Chemo peripheral neuropathy: as above.  4.Hypertension, generally well controlled 5.anemia: B12 low normal 01-2015 despite vegetarian diet, iron ok then and on oral iron now. Transfused 2 units PRBCs mid May. Has been off B12 recently (except in multivit) so will resume po B12 and follow 7.hyponatremia, hypochloremia: likely due to diuretic, improved. 8.GERD: much better with protonix   Chemo and granix orders confirmed. All questions answered. Time spent 25 min including >50% counseling and coordination of care.     Brittany Schwartz P, MD   06/15/2015, 9:30 AM

## 2015-06-16 ENCOUNTER — Ambulatory Visit: Payer: 59

## 2015-06-16 ENCOUNTER — Ambulatory Visit (HOSPITAL_BASED_OUTPATIENT_CLINIC_OR_DEPARTMENT_OTHER): Payer: 59

## 2015-06-16 ENCOUNTER — Other Ambulatory Visit (HOSPITAL_BASED_OUTPATIENT_CLINIC_OR_DEPARTMENT_OTHER): Payer: 59

## 2015-06-16 VITALS — BP 156/85 | HR 105 | Temp 97.8°F | Resp 18

## 2015-06-16 DIAGNOSIS — C541 Malignant neoplasm of endometrium: Secondary | ICD-10-CM | POA: Diagnosis not present

## 2015-06-16 DIAGNOSIS — E119 Type 2 diabetes mellitus without complications: Secondary | ICD-10-CM | POA: Diagnosis not present

## 2015-06-16 DIAGNOSIS — Z5111 Encounter for antineoplastic chemotherapy: Secondary | ICD-10-CM

## 2015-06-16 LAB — WHOLE BLOOD GLUCOSE
Glucose: 347 mg/dL — ABNORMAL HIGH (ref 70–100)
HRS PC: 1.5 Hours

## 2015-06-16 MED ORDER — FAMOTIDINE IN NACL 20-0.9 MG/50ML-% IV SOLN
20.0000 mg | Freq: Once | INTRAVENOUS | Status: AC
  Administered 2015-06-16: 20 mg via INTRAVENOUS

## 2015-06-16 MED ORDER — FAMOTIDINE IN NACL 20-0.9 MG/50ML-% IV SOLN
INTRAVENOUS | Status: AC
  Filled 2015-06-16: qty 50

## 2015-06-16 MED ORDER — SODIUM CHLORIDE 0.9 % IV SOLN
Freq: Once | INTRAVENOUS | Status: AC
  Administered 2015-06-16: 11:00:00 via INTRAVENOUS

## 2015-06-16 MED ORDER — DIPHENHYDRAMINE HCL 50 MG/ML IJ SOLN
50.0000 mg | Freq: Once | INTRAMUSCULAR | Status: AC
  Administered 2015-06-16: 50 mg via INTRAVENOUS

## 2015-06-16 MED ORDER — INSULIN REGULAR HUMAN 100 UNIT/ML IJ SOLN
2.0000 [IU] | Freq: Once | INTRAMUSCULAR | Status: DC | PRN
  Filled 2015-06-16: qty 0.08

## 2015-06-16 MED ORDER — SODIUM CHLORIDE 0.9 % IV SOLN
Freq: Once | INTRAVENOUS | Status: AC
  Administered 2015-06-16: 12:00:00 via INTRAVENOUS
  Filled 2015-06-16: qty 8

## 2015-06-16 MED ORDER — SODIUM CHLORIDE 0.9 % IV SOLN
398.7000 mg | Freq: Once | INTRAVENOUS | Status: AC
  Administered 2015-06-16: 400 mg via INTRAVENOUS
  Filled 2015-06-16: qty 40

## 2015-06-16 MED ORDER — DIPHENHYDRAMINE HCL 50 MG/ML IJ SOLN
INTRAMUSCULAR | Status: AC
  Filled 2015-06-16: qty 1

## 2015-06-16 MED ORDER — INSULIN REGULAR HUMAN 100 UNIT/ML IJ SOLN
2.0000 [IU] | Freq: Once | INTRAMUSCULAR | Status: AC
  Administered 2015-06-16: 8 [IU] via SUBCUTANEOUS
  Filled 2015-06-16: qty 0.08

## 2015-06-16 MED ORDER — PACLITAXEL CHEMO INJECTION 300 MG/50ML
135.0000 mg/m2 | Freq: Once | INTRAVENOUS | Status: AC
  Administered 2015-06-16: 210 mg via INTRAVENOUS
  Filled 2015-06-16: qty 35

## 2015-06-16 NOTE — Progress Notes (Signed)
HR 105.pt stable with no other complaints.  Okay to proceed with treatment per Gentry Fitz NP.

## 2015-06-16 NOTE — Patient Instructions (Signed)
[facility] Discharge Instructions for Patients Receiving Chemotherapy  Today you received the following chemotherapy agents Taxol/Carboplatin.  To help prevent nausea and vomiting after your treatment, we encourage you to take your nausea medication as directed.   If you develop nausea and vomiting that is not controlled by your nausea medication, call the clinic.   BELOW ARE SYMPTOMS THAT SHOULD BE REPORTED IMMEDIATELY:  *FEVER GREATER THAN 100.5 F  *CHILLS WITH OR WITHOUT FEVER  NAUSEA AND VOMITING THAT IS NOT CONTROLLED WITH YOUR NAUSEA MEDICATION  *UNUSUAL SHORTNESS OF BREATH  *UNUSUAL BRUISING OR BLEEDING  TENDERNESS IN MOUTH AND THROAT WITH OR WITHOUT PRESENCE OF ULCERS  *URINARY PROBLEMS  *BOWEL PROBLEMS  UNUSUAL RASH Items with * indicate a potential emergency and should be followed up as soon as possible.  Feel free to call the clinic you have any questions or concerns. The clinic phone number is [phone].  Please show the CHEMO ALERT CARD at check-in to the Emergency Department and triage nurse.    

## 2015-06-18 ENCOUNTER — Other Ambulatory Visit: Payer: Self-pay | Admitting: Oncology

## 2015-06-21 ENCOUNTER — Other Ambulatory Visit (HOSPITAL_BASED_OUTPATIENT_CLINIC_OR_DEPARTMENT_OTHER): Payer: 59

## 2015-06-21 ENCOUNTER — Telehealth: Payer: Self-pay | Admitting: *Deleted

## 2015-06-21 ENCOUNTER — Other Ambulatory Visit: Payer: Self-pay | Admitting: *Deleted

## 2015-06-21 DIAGNOSIS — R300 Dysuria: Secondary | ICD-10-CM

## 2015-06-21 DIAGNOSIS — C541 Malignant neoplasm of endometrium: Secondary | ICD-10-CM | POA: Diagnosis not present

## 2015-06-21 LAB — URINALYSIS, MICROSCOPIC - CHCC
Bilirubin (Urine): NEGATIVE
GLUCOSE UR CHCC: NEGATIVE mg/dL
KETONES: NEGATIVE mg/dL
NITRITE: NEGATIVE
PH: 7 (ref 4.6–8.0)
Specific Gravity, Urine: 1.01 (ref 1.003–1.035)
UROBILINOGEN UR: 0.2 mg/dL (ref 0.2–1)

## 2015-06-21 NOTE — Telephone Encounter (Addendum)
Received phone call from patient's daughter stating that her mom has urinary frequency and burning for the past few days. Denies any fevers or other symptoms. Orders and lab appointment made to collect urine sample. Dr. Marko Plume notified of this. Told patient's daughter we will call her with the results once they are back.

## 2015-06-22 ENCOUNTER — Telehealth: Payer: Self-pay

## 2015-06-22 DIAGNOSIS — R300 Dysuria: Secondary | ICD-10-CM

## 2015-06-22 MED ORDER — CIPROFLOXACIN HCL 250 MG PO TABS: 250.0000 mg | ORAL_TABLET | Freq: Two times a day (BID) | ORAL | Status: DC

## 2015-06-22 NOTE — Telephone Encounter (Signed)
Told daughter that U/A looks like mother may have infection in urine.  Sent Cipro to pharmacy 250 mg q 12 hrs x 7 days will follow up with urine culture until final per Dr. Marko Plume.

## 2015-06-23 ENCOUNTER — Ambulatory Visit (HOSPITAL_BASED_OUTPATIENT_CLINIC_OR_DEPARTMENT_OTHER): Payer: 59

## 2015-06-23 VITALS — BP 129/61 | HR 91 | Temp 98.2°F

## 2015-06-23 DIAGNOSIS — C541 Malignant neoplasm of endometrium: Secondary | ICD-10-CM | POA: Diagnosis not present

## 2015-06-23 DIAGNOSIS — Z5189 Encounter for other specified aftercare: Secondary | ICD-10-CM | POA: Diagnosis not present

## 2015-06-23 MED ORDER — TBO-FILGRASTIM 300 MCG/0.5ML ~~LOC~~ SOSY
300.0000 ug | PREFILLED_SYRINGE | Freq: Once | SUBCUTANEOUS | Status: AC
  Administered 2015-06-23: 300 ug via SUBCUTANEOUS
  Filled 2015-06-23: qty 0.5

## 2015-06-24 ENCOUNTER — Ambulatory Visit (HOSPITAL_BASED_OUTPATIENT_CLINIC_OR_DEPARTMENT_OTHER): Payer: 59

## 2015-06-24 VITALS — BP 125/67 | HR 91 | Temp 97.9°F | Resp 16

## 2015-06-24 DIAGNOSIS — C541 Malignant neoplasm of endometrium: Secondary | ICD-10-CM

## 2015-06-24 DIAGNOSIS — Z5189 Encounter for other specified aftercare: Secondary | ICD-10-CM

## 2015-06-24 LAB — URINE CULTURE

## 2015-06-24 MED ORDER — TBO-FILGRASTIM 300 MCG/0.5ML ~~LOC~~ SOSY
300.0000 ug | PREFILLED_SYRINGE | Freq: Once | SUBCUTANEOUS | Status: AC
  Administered 2015-06-24: 300 ug via SUBCUTANEOUS

## 2015-06-26 ENCOUNTER — Telehealth: Payer: Self-pay | Admitting: Oncology

## 2015-06-26 ENCOUNTER — Encounter: Payer: Self-pay | Admitting: Oncology

## 2015-06-26 ENCOUNTER — Ambulatory Visit (HOSPITAL_BASED_OUTPATIENT_CLINIC_OR_DEPARTMENT_OTHER): Payer: 59 | Admitting: Oncology

## 2015-06-26 ENCOUNTER — Other Ambulatory Visit (HOSPITAL_BASED_OUTPATIENT_CLINIC_OR_DEPARTMENT_OTHER): Payer: 59

## 2015-06-26 VITALS — BP 131/63 | HR 90 | Temp 98.6°F | Resp 18 | Ht 61.0 in | Wt 122.8 lb

## 2015-06-26 DIAGNOSIS — C541 Malignant neoplasm of endometrium: Secondary | ICD-10-CM | POA: Diagnosis not present

## 2015-06-26 DIAGNOSIS — D6481 Anemia due to antineoplastic chemotherapy: Secondary | ICD-10-CM

## 2015-06-26 DIAGNOSIS — T451X5A Adverse effect of antineoplastic and immunosuppressive drugs, initial encounter: Secondary | ICD-10-CM

## 2015-06-26 DIAGNOSIS — E119 Type 2 diabetes mellitus without complications: Secondary | ICD-10-CM

## 2015-06-26 DIAGNOSIS — G620 Drug-induced polyneuropathy: Secondary | ICD-10-CM

## 2015-06-26 DIAGNOSIS — D701 Agranulocytosis secondary to cancer chemotherapy: Secondary | ICD-10-CM

## 2015-06-26 LAB — CBC WITH DIFFERENTIAL/PLATELET
BASO%: 0.2 % (ref 0.0–2.0)
Basophils Absolute: 0 10*3/uL (ref 0.0–0.1)
EOS%: 0.3 % (ref 0.0–7.0)
Eosinophils Absolute: 0 10*3/uL (ref 0.0–0.5)
HCT: 22.1 % — ABNORMAL LOW (ref 34.8–46.6)
HEMOGLOBIN: 7.5 g/dL — AB (ref 11.6–15.9)
LYMPH%: 27.6 % (ref 14.0–49.7)
MCH: 30.7 pg (ref 25.1–34.0)
MCHC: 33.9 g/dL (ref 31.5–36.0)
MCV: 90.6 fL (ref 79.5–101.0)
MONO#: 1.6 10*3/uL — ABNORMAL HIGH (ref 0.1–0.9)
MONO%: 18 % — ABNORMAL HIGH (ref 0.0–14.0)
NEUT%: 53.9 % (ref 38.4–76.8)
NEUTROS ABS: 4.9 10*3/uL (ref 1.5–6.5)
Platelets: 154 10*3/uL (ref 145–400)
RBC: 2.44 10*6/uL — AB (ref 3.70–5.45)
RDW: 16.4 % — ABNORMAL HIGH (ref 11.2–14.5)
WBC: 9.1 10*3/uL (ref 3.9–10.3)
lymph#: 2.5 10*3/uL (ref 0.9–3.3)

## 2015-06-26 LAB — COMPREHENSIVE METABOLIC PANEL (CC13)
ALT: 16 U/L (ref 0–55)
AST: 14 U/L (ref 5–34)
Albumin: 3.6 g/dL (ref 3.5–5.0)
Alkaline Phosphatase: 74 U/L (ref 40–150)
Anion Gap: 11 mEq/L (ref 3–11)
BUN: 5.8 mg/dL — AB (ref 7.0–26.0)
CALCIUM: 8.5 mg/dL (ref 8.4–10.4)
CHLORIDE: 100 meq/L (ref 98–109)
CO2: 22 meq/L (ref 22–29)
CREATININE: 0.8 mg/dL (ref 0.6–1.1)
EGFR: 84 mL/min/{1.73_m2} — AB (ref >90)
Glucose: 125 mg/dl (ref 70–140)
Potassium: 4.5 mEq/L (ref 3.5–5.1)
Sodium: 133 mEq/L — ABNORMAL LOW (ref 136–145)
Total Bilirubin: 0.26 mg/dL (ref 0.20–1.20)
Total Protein: 6.3 g/dL — ABNORMAL LOW (ref 6.4–8.3)

## 2015-06-26 NOTE — Telephone Encounter (Signed)
Gave avs & calendar for August/September. Pt schedule 09/15 due to no MD available 09/08

## 2015-06-26 NOTE — Progress Notes (Signed)
OFFICE PROGRESS NOTE   June 26, 2015   Physicians:[name]/ Laurine Blazer, Providence Crosby, MD (PCP, [facility]), Aloha Gell  INTERVAL HISTORY:  Patient is seen, alone for visit, in continuing attention to adjuvant chemotherapy for IIIA grade 3 serous endometrial carcinoma. She had cycle 6 carboplatin taxol on 06-16-15 with granix on 7-22 and 7-23. Plan is for restaging CTs 07-07-15 prior to seeing Dr Denman George on 07-17-15. As radiation is consideration depending on restaging, she is also scheduled for new patient consultation with Dr Sondra Come on 07-26-15,   Patient has had no new or different problems since last chemotherapy. Peripheral neuropathy in feet is very noticeable "like walking on foam" , minimal in hands. The neuropathy is not painful and she is more comfortable in some shoes; she prefers to see if this will improve out further from chemo rather than adding gabapentin now. Appetite is generally good and bowels are moving adequately. She denies SOB at rest, any chest pain, and not markedly fatigued including coming into office today. She has had no bleeding. She denies abdominal or pelvic discomfort.    No PAC No genetics testing  ONCOLOGIC HISTORY Patient presented to Dr [name] 01-03-15 with vaginal spotting x 1 year. Pelvic US showed 33 mm endometrial stripe with ovaries normal, and endometrial biopsy also 01-03-15 reportedly had grade 3 endometrial adenocarcinoma. She was seen in consultation by Dr Denman George on 2-8-1. CT CAP 01-13-15 had negative chest, no adenopathy, mild fatty liver, markedly thickened endometrium and no evidence of metastatic disease outside of uterus. CA 125 preoperatively on 01-13-15 was 22.9. Surgery by Dr [name] 01-19-15 was robotic hysterectomy, BSO and resection of nodule at sigmoid serosa. At surgery there was milial tumor studding with 5-10 mm implants on bladder peritoneum and miliary disease on pelvic sidewalls, without upper abdominal disease evident, and omentum appeared  normal. Pathology ([id number]) found IIIA grade 3 serous/ endometrioid endometrial carcinoma with 8 mm / 12 mm myometrial invasion (60%), + LVSI, and extensive serous involvement of sigmoid nodule. She was seen for post operative follow up by Dr Denman George on 02-03-15, with recommendation for 6 cycles of taxol carboplatin, then consideration of external beam RT + vaginal brachytherapy if CR. First carboplatin taxol was given 02-16-2015; she was neutropenic with ANC 0.7 by day 15 cycle 1. She was transfused 2 units PRBCs 5-19/5-20-16 for Hgb 7.8. Cycles 4 and 5 were both delayed due to thrombocytopenia, carbo AUC adjusted.    Review of systems as above, also: No swelling LE. Bladder ok. No fever or symptoms of infection.  Remainder of 10 point Review of Systems negative.  Objective:  Vital signs in last 24 hours:  BP 131/63 mmHg  Pulse 90  Temp(Src) 98.6 F (37 C) (Oral)  Resp 18  Ht _0  (1.549 m)  Wt 122 lb 12.8 oz (55.702 kg)  BMI 23.21 kg/m2  SpO2 100% Weight up 1 lb Alert, oriented and appropriate. Ambulatory without difficulty, wearing slip on clogs  Alopecia  HEENT:PERRL, sclerae not icteric. Oral mucosa moist without lesions, posterior pharynx clear.  Neck supple. No JVD. Mucous membranes pale. Lymphatics:no cervical,supraclavicular or inguinal adenopathy Resp: clear to auscultation bilaterally and normal percussion bilaterally Cardio: regular rate and rhythm. No gallop. GI: soft, nontender, not distended, no mass or organomegaly. Normally active bowel sounds. Surgical incision not remarkable. Musculoskeletal/ Extremities: without pitting edema, cords, tenderness. Nailbeds pale Neuro: no peripheral neuropathy. Otherwise nonfocal Skin without rash, ecchymosis, petechiae  Lab Results:  Results for orders placed or performed in visit on 06/26/15  CBC with Differential  Result Value Ref Range   WBC 9.1 3.9 - 10.3 10e3/uL   NEUT# 4.9 1.5 - 6.5 10e3/uL   HGB 7.5 (L) 11.6 - 15.9  g/dL   HCT 22.1 (L) 34.8 - 46.6 %   Platelets 154 145 - 400 10e3/uL   MCV 90.6 79.5 - 101.0 fL   MCH 30.7 25.1 - 34.0 pg   MCHC 33.9 31.5 - 36.0 g/dL   RBC 2.44 (L) 3.70 - 5.45 10e6/uL   RDW 16.4 (H) 11.2 - 14.5 %   lymph# 2.5 0.9 - 3.3 10e3/uL   MONO# 1.6 (H) 0.1 - 0.9 10e3/uL   Eosinophils Absolute 0.0 0.0 - 0.5 10e3/uL   Basophils Absolute 0.0 0.0 - 0.1 10e3/uL   NEUT% 53.9 38.4 - 76.8 %   LYMPH% 27.6 14.0 - 49.7 %   MONO% 18.0 (H) 0.0 - 14.0 %   EOS% 0.3 0.0 - 7.0 %   BASO% 0.2 0.0 - 2.0 %  Comprehensive metabolic panel (Cmet) - [facility]  Result Value Ref Range   Sodium 133 (L) 136 - 145 mEq/L   Potassium 4.5 3.5 - 5.1 mEq/L   Chloride 100 98 - 109 mEq/L   CO2 22 22 - 29 mEq/L   Glucose 125 70 - 140 mg/dl   BUN 5.8 (L) 7.0 - 26.0 mg/dL   Creatinine 0.8 0.6 - 1.1 mg/dL   Total Bilirubin 0.26 0.20 - 1.20 mg/dL   Alkaline Phosphatase 74 40 - 150 U/L   AST 14 5 - 34 U/L   ALT 16 0 - 55 U/L   Total Protein 6.3 (L) 6.4 - 8.3 g/dL   Albumin 3.6 3.5 - 5.0 g/dL   Calcium 8.5 8.4 - 10.4 mg/dL   Anion Gap 11 3 - 11 mEq/L   EGFR 84 (L) >90 ml/min/1.73 m2     Studies/Results:  No results found.  Medications: I have reviewed the patient's current medications.  DISCUSSION: CBC as above, particularly hemoglobin of 7.5. Patient insists that she is not symptomatic, but agrees to call if more fatigue, SOB etc. She will continue oral iron and we will recheck labs at least with Dr Serita Grit visit, or sooner if needed. Peripheral neuropathy discussed as above. She understands that she should gradually feel stronger, but that full recovery from surgery then chemo will probably take several months.   Assessment/Plan: 1.IIIA grade 3 serous endometrial carcinoma: residual at least miliary disease at completion of surgery, first carbo taxol 02-16-15. gCSF support since neutropenic after cycle 1. Cycle 6 completed 06-16-15. Repeat CT imaging and follow up with Dr Denman George in next few weeks. Consultation  with Dr Sondra Come is scheduled afterwards if that is appropriate. I will see her back also with labs in Sept.  2.diabetes x 15 years: SS insulin used with each chemo infusion. PCP manages diabetes otherwise. 3. Chemo peripheral neuropathy: as above.  4.Hypertension, generally well controlled 5.anemia: B12 low normal 01-2015 despite vegetarian diet, iron ok then and on oral iron now. Transfused 2 units PRBCs mid May. Has resumed B12 po. If more symptomatic would be appropriate to transfuse. Repeat CBC at least day of Dr Serita Grit visit.  7.hyponatremia, hypochloremia: likely due to diuretic, improved. 8.GERD: much better with protonix   All questions answered. Patient knows that she can call if concerns prior to next scheduled visit. CC PCP. Time spent 25 min including >50% counseling and coordination of care.     LIVESAY,LENNIS P, MD   06/26/2015, 1:27 PM

## 2015-06-30 ENCOUNTER — Encounter: Payer: Self-pay | Admitting: Oncology

## 2015-06-30 NOTE — Progress Notes (Signed)
[facility] END OF TREATMENT   Name: Charae Depaolis Date: June 30, 2015  MRN: [id number] DOB: [birth year]   TREATMENT DATES: 02-16-15 thru 06-16-15  REFERRING PHYSICIAN: Everitt Amber   DIAGNOSIS: endometrial carcinoma, grade 3 serous   STAGE AT START OF TREATMENT: IIIA   INTENT: curative   DRUGS OR REGIMENS GIVEN:carboplatin taxol   MAJOR TOXICITIES: neutropenia, anemia, peripheral neuropathy   REASON TREATMENT STOPPED:completion of planned course   PERFORMANCE STATUS AT END: 1  ONGOING PROBLEMS: peripheral neuropathy in feet and anemia   FOLLOW UP PLANS: restaging scans, gyn onc, med onc, possibly rad onc

## 2015-07-06 ENCOUNTER — Other Ambulatory Visit: Payer: Self-pay | Admitting: Oncology

## 2015-07-06 DIAGNOSIS — K219 Gastro-esophageal reflux disease without esophagitis: Secondary | ICD-10-CM

## 2015-07-07 ENCOUNTER — Ambulatory Visit (HOSPITAL_COMMUNITY)
Admission: RE | Admit: 2015-07-07 | Discharge: 2015-07-07 | Disposition: A | Discharge status: Home / Self Care | Payer: 59 | Source: Ambulatory Visit | Attending: Oncology | Admitting: Oncology

## 2015-07-07 ENCOUNTER — Encounter (HOSPITAL_COMMUNITY): Payer: Self-pay

## 2015-07-07 DIAGNOSIS — Z90710 Acquired absence of both cervix and uterus: Secondary | ICD-10-CM | POA: Insufficient documentation

## 2015-07-07 DIAGNOSIS — Z9221 Personal history of antineoplastic chemotherapy: Secondary | ICD-10-CM | POA: Insufficient documentation

## 2015-07-07 DIAGNOSIS — C541 Malignant neoplasm of endometrium: Secondary | ICD-10-CM

## 2015-07-07 DIAGNOSIS — Z08 Encounter for follow-up examination after completed treatment for malignant neoplasm: Secondary | ICD-10-CM | POA: Insufficient documentation

## 2015-07-07 DIAGNOSIS — K760 Fatty (change of) liver, not elsewhere classified: Secondary | ICD-10-CM | POA: Insufficient documentation

## 2015-07-07 MED ORDER — IOHEXOL 300 MG/ML  SOLN
100.0000 mL | Freq: Once | INTRAMUSCULAR | Status: AC | PRN
  Administered 2015-07-07: 80 mL via INTRAVENOUS

## 2015-07-17 ENCOUNTER — Other Ambulatory Visit (HOSPITAL_BASED_OUTPATIENT_CLINIC_OR_DEPARTMENT_OTHER): Payer: 59

## 2015-07-17 ENCOUNTER — Telehealth: Payer: Self-pay | Admitting: Nurse Practitioner

## 2015-07-17 ENCOUNTER — Ambulatory Visit: Payer: 59 | Attending: Gynecologic Oncology | Admitting: Gynecologic Oncology

## 2015-07-17 ENCOUNTER — Encounter: Payer: Self-pay | Admitting: Gynecologic Oncology

## 2015-07-17 VITALS — BP 142/65 | HR 82 | Temp 98.3°F | Resp 18 | Ht 61.0 in | Wt 123.1 lb

## 2015-07-17 DIAGNOSIS — C541 Malignant neoplasm of endometrium: Secondary | ICD-10-CM

## 2015-07-17 LAB — CBC WITH DIFFERENTIAL/PLATELET
BASO%: 0.6 % (ref 0.0–2.0)
BASOS ABS: 0 10*3/uL (ref 0.0–0.1)
EOS%: 0.8 % (ref 0.0–7.0)
Eosinophils Absolute: 0 10*3/uL (ref 0.0–0.5)
HCT: 26.1 % — ABNORMAL LOW (ref 34.8–46.6)
HEMOGLOBIN: 8.7 g/dL — AB (ref 11.6–15.9)
LYMPH%: 36.1 % (ref 14.0–49.7)
MCH: 32.1 pg (ref 25.1–34.0)
MCHC: 33.4 g/dL (ref 31.5–36.0)
MCV: 96 fL (ref 79.5–101.0)
MONO#: 0.6 10*3/uL (ref 0.1–0.9)
MONO%: 13.8 % (ref 0.0–14.0)
NEUT%: 48.7 % (ref 38.4–76.8)
NEUTROS ABS: 2.1 10*3/uL (ref 1.5–6.5)
Platelets: 250 10*3/uL (ref 145–400)
RBC: 2.72 10*6/uL — ABNORMAL LOW (ref 3.70–5.45)
RDW: 17.5 % — AB (ref 11.2–14.5)
WBC: 4.3 10*3/uL (ref 3.9–10.3)
lymph#: 1.5 10*3/uL (ref 0.9–3.3)

## 2015-07-17 NOTE — Telephone Encounter (Signed)
-----   Message from [name], MD sent at 07/17/2015  4:00 PM EDT ----- Labs seen and need follow up: please let her know blood counts are some better, with hemoglobin up to 8.7. She is still very anemic at 8.7, but better. Continue oral iron and we will check again at my apt in Sept.

## 2015-07-17 NOTE — Progress Notes (Signed)
Gyn Onc Followup Visit  Assessment:    64 y.o. 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   S/p robotic hysterectomy, BSO and resection of sigmoid nodule on 01/19/15. + LVSI, 60% myometrial invasion, lymph nodes not assessed.   She is s/p adjuvant therapy with 6 cycles carboplatin and paclitaxel and has had a complete radiographic response.  Plan: 1) Recommend adjuvant vaginal brachytherapy. I discussed that this will reduce her risk for vaginal recurrence, however, will not have impact on overall survival or distant recurrence. Given the low toxicity profile of vaginal brachytherapy, I believe its incremental benefit outweighs the risks in this case. However, I would not recommend adjuvant whole pelvic RT for this patient with peritoneal disease s/p chemotherapy. She has an appointment to see Dr Sondra Come.  2) return to clinic in 3 months after completing radiation. Follow CA 125 as part of surveillance  HPI:  Brittany Schwartz is a 64 y.o. year old referred by Dr [name], initially seen in consultation on 01/09/15 for serous endometrial cancer.  She then underwent a robotic hysterectomy, BSO, resection of sigmoid colon nodule on 6/81/59 without complications.  Her postoperative course was uncomplicated.  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 There was no cervical stromal involvement. There was involvement of the anterior uterine peritoneal reflection and left fallopian tube. Intraoperatively, Dr [name] visualized millial disease/tumor studding of the bladder peritoneum and pelvic peritoneum, but no upper abdominal visible disease. The omentum appeared normal.  She went on to receive adjuvant chemotherapy with 6 cycles of paclitaxel and carboplatin (from 02/16/15 until 06/16/15). She experienced issues with neutropenia  requiring BM support stimulants.   Past Medical History  Diagnosis Date  . Hypertension   . Glaucoma   . Uterine cancer   . Diabetes mellitus without complication   . Palpitations   . Muscle cramps   . Postmenopausal vaginal bleeding     Past Surgical History  Procedure Laterality Date  . Robotic assisted total hysterectomy with bilateral salpingo oopherectomy Bilateral 01/19/2015    Procedure: XI ROBOTIC ASSISTED TOTAL HYSTERECTOMY WITH BILATERAL SALPINGO OOPHORECTOMY;  Surgeon: [name], MD;  Location: [facility];  Service: Gynecology;  Laterality: Bilateral;    History reviewed. No pertinent family history.  Social History   Social History  . Marital Status: Married    Spouse Name: N/A  . Number of Children: N/A  . Years of Education: N/A   Occupational History  . Not on file.   Social History Main Topics  . Smoking status: Never Smoker   . Smokeless tobacco: Not on file  . Alcohol Use: No  . Drug Use: No  . Sexual Activity: Yes   Other Topics Concern  . Not on file   Social History Narrative    Current Outpatient Prescriptions on File Prior to Visit  Medication Sig Dispense Refill  . acetaminophen (TYLENOL) 325 MG tablet Take 650 mg by mouth every 6 (six) hours as needed.    Marland Kitchen atenolol-chlorthalidone (TENORETIC) 50-25 MG per tablet Take 1 tablet by mouth every morning.     . cholecalciferol (VITAMIN D) 400 UNITS TABS tablet Take 800 Units by mouth daily.    . ferrous fumarate (HEMOCYTE - 106 MG FE) 325 (106 FE) MG TABS tablet Take 1 tablet daily  on an empty stomach with OJ or vitamin C tablet 30 each 3  .  glipiZIDE (GLUCOTROL XL) 5 MG 24 hr tablet Take 5 mg by mouth 2 (two) times daily.    Marland Kitchen ibuprofen (ADVIL,MOTRIN) 200 MG tablet Take 200 mg by mouth every 6 (six) hours as needed (as needed for bady aches after treatment).    Marland Kitchen latanoprost (XALATAN) 0.005 % ophthalmic solution Place 1 drop into both eyes at bedtime.    Marland Kitchen lisinopril (PRINIVIL,ZESTRIL) 10 MG  tablet Take 10 mg by mouth at bedtime.    . metFORMIN (GLUCOPHAGE) 500 MG tablet Take 500 mg by mouth 2 (two) times daily with a meal.    . Multiple Vitamin (MULTIVITAMIN WITH MINERALS) TABS tablet Take 1 tablet by mouth daily.    . pantoprazole (PROTONIX) 40 MG tablet TAKE ONE TABLET BY MOUTH ONCE DAILY 30 tablet 2  . pravastatin (PRAVACHOL) 20 MG tablet Take 20 mg by mouth daily.    . timolol (BETIMOL) 0.5 % ophthalmic solution Place 1 drop into both eyes 2 (two) times daily.    Marland Kitchen LORazepam (ATIVAN) 0.5 MG tablet Place 1 tablet under tongue or swallow every 6 hours as needed for nausea. Will make drowsy. (Patient not taking: Reported on 07/17/2015) 20 tablet 0  . ondansetron (ZOFRAN) 8 MG tablet Take 1 tablet by mouth every 8 hours as needed for nausea. Will not make drowsy. (Patient not taking: Reported on 06/15/2015) 30 tablet 1   No current facility-administered medications on file prior to visit.    No Known Allergies   Review of systems: Constitutional:  She has no weight gain or weight loss. She has no fever or chills. Eyes: No blurred vision Ears, Nose, Mouth, Throat: No dizziness, headaches or changes in hearing. No mouth sores. Cardiovascular: No chest pain, palpitations or edema. Respiratory:  No shortness of breath, wheezing or cough Gastrointestinal: She has normal bowel movements without diarrhea or constipation. She denies any nausea or vomiting. She denies blood in her stool or heart burn. Genitourinary:  She denies pelvic pain, pelvic pressure or changes in her urinary function. She has no hematuria, dysuria, or incontinence. She has no irregular vaginal bleeding or vaginal discharge Musculoskeletal: Denies muscle weakness or joint pains.  Skin:  She has no skin changes, rashes or itching Neurological:  Denies dizziness or headaches. + neuropathy in hands and feet Psychiatric:  She denies depression or anxiety. Hematologic/Lymphatic:   No easy bruising or  bleeding   Physical Exam: Blood pressure 142/65, pulse 82, temperature 98.3 F (36.8 C), temperature source Oral, resp. rate 18, height 5\' 1"  (1.549 m), weight 123 lb 1.6 oz (55.838 kg), SpO2 100 %. General: Well dressed, well nourished in no apparent distress.   HEENT:  Normocephalic and atraumatic, no lesions.  Extraocular muscles intact. Sclerae anicteric. Pupils equal, round, reactive. No mouth sores or ulcers. Thyroid is normal size, not nodular, midline. Skin:  No lesions or rashes. Breasts: deferred Lungs:  deferred Cardiovascular: deferred Abdomen:  Soft, nontender, nondistended.  No palpable masses.  No hepatosplenomegaly.  No ascites. Normal bowel sounds.  No hernias.  Incisions are well healed. Genitourinary: Normal EGBUS  Vaginal cuff intact.  No bleeding or discharge.  No cul de sac fullness. Extremities: No cyanosis, clubbing or edema.  No calf tenderness or erythema. No palpable cords. Psychiatric: Mood and affect are appropriate. Neurological: Awake, alert and oriented x 3. Sensation is intact, no neuropathy.  Musculoskeletal: No pain, normal strength and range of motion.  Donaciano Eva, MD

## 2015-07-17 NOTE — Telephone Encounter (Signed)
Per MD, informed patients daughter of lab results and to continue oral iron. We will recheck labs at next apt. She is encouraged to call clinic with changes or new or worsening symptoms. Daughter verbalizes understanding and thanks for the call.

## 2015-07-17 NOTE — Patient Instructions (Signed)
Plan to follow up with Dr. Denman George in December or sooner if issues/symptoms arise.

## 2015-07-18 ENCOUNTER — Telehealth: Payer: Self-pay | Admitting: Oncology

## 2015-07-18 ENCOUNTER — Telehealth: Payer: Self-pay | Admitting: Nurse Practitioner

## 2015-07-18 ENCOUNTER — Other Ambulatory Visit: Payer: Self-pay | Admitting: Oncology

## 2015-07-18 DIAGNOSIS — T451X5A Adverse effect of antineoplastic and immunosuppressive drugs, initial encounter: Principal | ICD-10-CM

## 2015-07-18 DIAGNOSIS — D6481 Anemia due to antineoplastic chemotherapy: Secondary | ICD-10-CM

## 2015-07-18 NOTE — Telephone Encounter (Signed)
Spoke with daughter [name] and reviewed the information noted below by Dr. Marko Plume.  Daughter appreciated the call and will review the information with her mother.

## 2015-07-18 NOTE — Telephone Encounter (Signed)
Per Joylene John, NP, Rn called patient (spoke with daughter) about possibly rescheduling appointments. Pt's daughter wants to keep schedule as is.  She also mentions CBC results 8/15 shows Hgb 8.7, noting that her mother is weaker these days, tires quickly, but denies SOB. She wants to know if RBC transfusion is an option. Message sent to Dr. Marko Plume.

## 2015-07-18 NOTE — Telephone Encounter (Addendum)
Patient Demographics     Patient Name Sex DOB SSN Address Phone    Isadora, Delorey Female 16-Feb-1951 [id number] [place] [address] [phone] [facility]) [phone] (Mobile)      Message  Received: Today    [name], MD  Alla Feeling, RN; Baruch Merl, RN           Thank you, Regan Rakers  Since chemo is completed and hgb is some better, would repeat Hgb and iron studies on 8-24 when she sees Dr Sondra Come. If Hgb lower or more symptoms then, fine for 1 unit PRBCs. If iron low then (was ok 4 months ago) would consider IV iron. Continue po iron for now.  POF and orders for labs in. RN please let patient know and watch for lab results on 8-24  thanks       Previous Messages     ----- Message -----   From: Alla Feeling, RN   Sent: 07/18/2015 12:58 PM    To: Baruch Merl, RN, [name], MD   Informed patient's daughter of CBC on 8/15; Hgb 8.7   Today I called her from gyn-onc and daughter mentioned her mother is weaker these days, tires quickly, but denies SOB. She wants to know if RBC transfusion is an option.   Thanks,  Regan Rakers

## 2015-07-18 NOTE — Telephone Encounter (Signed)
Spoke with patient and she is aware of her 8/24 lab  anne

## 2015-07-19 NOTE — Progress Notes (Signed)
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   Brittany Schwartz presented to Dr [name] 01-03-15 with vaginal spotting x 1 year.   Biopsies revealed:   01/19/15 Diagnosis 1. Small Intestine Biopsy - HIGH GRADE CARCINOMA. - SEE COMMENT. 2. Uterus +/- tubes/ovaries, neoplastic - HIGH GRADE ENDOMETRIAL ADENOCARCINOMA INVADING THE MYOMETRIUM TO A DEPTH OF 0.8 CM IN A 1.1 CM THICK MYOMETRIUM. - SEROSAL INVOLVEMENT OF ANTERIOR PERITONEAL REFLECTION AND LEFT FALLOPIAN TUBE. - RIGHT AND LEFT OVARIES AND RIGHT FALLOPIAN TUBE NOT INVOLVED.  Past/Anticipated interventions by Gyn/Onc surgery, if any: 01/19/15 - Procedure: XI ROBOTIC ASSISTED TOTAL HYSTERECTOMY WITH BILATERAL SALPINGO OOPHORECTOMY;  Surgeon: [name], MD;  Location: [facility];  Service: Gynecology;  Laterality: Bilateral;  Past/Anticipated interventions by medical oncology, if any: first carbo taxol 02-16-15. gCSF support since neutropenic after cycle 1. Cycle 6 completed 06-16-15  Weight changes, if any: no  Bowel/Bladder complaints, if any: no  Nausea/Vomiting, if any: no  Pain issues, if any:  Yes - has neuropathy in both feet.  She has pain in her right heal when walking.  SAFETY ISSUES:  Prior radiation? no  Pacemaker/ICD? no  Possible current pregnancy? no  Is the patient on methotrexate? no  Current Complaints / other details:  Patient is here with her daughter.  She has two children.  She had blood work done today due to low blood counts from chemotherapy.  BP 161/78 mmHg  Pulse 81  Temp(Src) 97.7 F (36.5 C) (Oral)  Resp 12  Ht 5\' 1"  (1.549 m)  Wt 125 lb (56.7 kg)  BMI 23.63 kg/m2

## 2015-07-26 ENCOUNTER — Other Ambulatory Visit (HOSPITAL_BASED_OUTPATIENT_CLINIC_OR_DEPARTMENT_OTHER): Payer: 59

## 2015-07-26 ENCOUNTER — Telehealth: Payer: Self-pay

## 2015-07-26 ENCOUNTER — Ambulatory Visit
Admission: RE | Admit: 2015-07-26 | Discharge: 2015-07-26 | Disposition: A | Discharge status: Home / Self Care | Payer: 59 | Source: Ambulatory Visit | Attending: Radiation Oncology | Admitting: Radiation Oncology

## 2015-07-26 ENCOUNTER — Ambulatory Visit (HOSPITAL_COMMUNITY)
Admission: RE | Admit: 2015-07-26 | Discharge: 2015-07-26 | Disposition: A | Discharge status: Home / Self Care | Payer: 59 | Source: Ambulatory Visit | Attending: Oncology | Admitting: Oncology

## 2015-07-26 ENCOUNTER — Other Ambulatory Visit: Payer: Self-pay

## 2015-07-26 ENCOUNTER — Encounter: Payer: Self-pay | Admitting: Radiation Oncology

## 2015-07-26 VITALS — BP 161/78 | HR 81 | Temp 97.7°F | Resp 12 | Ht 61.0 in | Wt 125.0 lb

## 2015-07-26 DIAGNOSIS — D649 Anemia, unspecified: Secondary | ICD-10-CM | POA: Diagnosis present

## 2015-07-26 DIAGNOSIS — I10 Essential (primary) hypertension: Secondary | ICD-10-CM | POA: Insufficient documentation

## 2015-07-26 DIAGNOSIS — C541 Malignant neoplasm of endometrium: Secondary | ICD-10-CM

## 2015-07-26 DIAGNOSIS — T451X5A Adverse effect of antineoplastic and immunosuppressive drugs, initial encounter: Principal | ICD-10-CM

## 2015-07-26 DIAGNOSIS — C55 Malignant neoplasm of uterus, part unspecified: Secondary | ICD-10-CM

## 2015-07-26 DIAGNOSIS — Z510 Encounter for antineoplastic radiation therapy: Secondary | ICD-10-CM | POA: Insufficient documentation

## 2015-07-26 DIAGNOSIS — D6481 Anemia due to antineoplastic chemotherapy: Secondary | ICD-10-CM

## 2015-07-26 DIAGNOSIS — H409 Unspecified glaucoma: Secondary | ICD-10-CM | POA: Diagnosis not present

## 2015-07-26 DIAGNOSIS — E119 Type 2 diabetes mellitus without complications: Secondary | ICD-10-CM | POA: Insufficient documentation

## 2015-07-26 DIAGNOSIS — R002 Palpitations: Secondary | ICD-10-CM | POA: Insufficient documentation

## 2015-07-26 LAB — CBC WITH DIFFERENTIAL/PLATELET
BASO%: 0.7 % (ref 0.0–2.0)
Basophils Absolute: 0 10*3/uL (ref 0.0–0.1)
EOS ABS: 0.1 10*3/uL (ref 0.0–0.5)
EOS%: 1.4 % (ref 0.0–7.0)
HCT: 26 % — ABNORMAL LOW (ref 34.8–46.6)
HGB: 8.5 g/dL — ABNORMAL LOW (ref 11.6–15.9)
LYMPH%: 31.8 % (ref 14.0–49.7)
MCH: 31.5 pg (ref 25.1–34.0)
MCHC: 32.9 g/dL (ref 31.5–36.0)
MCV: 95.7 fL (ref 79.5–101.0)
MONO#: 0.4 10*3/uL (ref 0.1–0.9)
MONO%: 9.7 % (ref 0.0–14.0)
NEUT#: 2.6 10*3/uL (ref 1.5–6.5)
NEUT%: 56.4 % (ref 38.4–76.8)
PLATELETS: 163 10*3/uL (ref 145–400)
RBC: 2.72 10*6/uL — AB (ref 3.70–5.45)
RDW: 16.2 % — ABNORMAL HIGH (ref 11.2–14.5)
WBC: 4.5 10*3/uL (ref 3.9–10.3)
lymph#: 1.4 10*3/uL (ref 0.9–3.3)

## 2015-07-26 LAB — IRON AND TIBC CHCC
%SAT: 24 % (ref 21–57)
Iron: 65 ug/dL (ref 41–142)
TIBC: 264 ug/dL (ref 236–444)
UIBC: 199 ug/dL (ref 120–384)

## 2015-07-26 LAB — FERRITIN CHCC: Ferritin: 164 ng/ml (ref 9–269)

## 2015-07-26 LAB — HOLD TUBE, BLOOD BANK

## 2015-07-26 NOTE — Telephone Encounter (Signed)
[name]'s Hgb. Is lower today at 8.5.  She states that she is feeling more tired.  Pt. will be transfused with 1 unit of PRBC's at [facility] at 0900 07-27-15. Patient and daughter verbalized understanding. Iron remains in a good level.

## 2015-07-26 NOTE — Progress Notes (Signed)
Please see the Nurse Progress Note in the MD Initial Consult Encounter for this patient. 

## 2015-07-26 NOTE — Progress Notes (Signed)
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 Denman George of gyn/onc at request of Dr. [name] for grade 3 endometrial adenocarcinoma. The patient reported a 1 year history of vaginal spotting. She saw Dr. [name] on 01/03/2015, and after reporting this history, a pelvic ultrasound was performed. On ultrasound, the uterus measured 8 x 5 x 4 cm with a 33 mm endometrial stripe and increased vascularity. The ovaries appeared to look normal and no fluid was noted in the cul-de-sac. An endometrial biopsy was performed and this revealed grade 3 endometrial adenocarcinoma. She is status post robotic hysterectomy, bilateral salpingo-oophorectomy (BSO) and resection of sigmoid nodule on 01/19/15. + LVSI, 60% myometrial invasion, lymph nodes not assessed. She is status post adjuvant therapy with 6 cycles carboplatin and paclitaxel, completed approximately one month ago, and has had a complete radiographic response. The patient was referred to me for the consideration of radiation to reduce chances for vaginal cuff recurrence.  PREVIOUS RADIATION THERAPY: No  PAST MEDICAL HISTORY:  has a past medical history of Hypertension; Glaucoma; Uterine cancer; Diabetes mellitus without complication; Palpitations; Muscle cramps; and Postmenopausal vaginal bleeding.    PAST SURGICAL HISTORY: Past Surgical History  Procedure Laterality Date  . Robotic assisted total hysterectomy with bilateral salpingo oopherectomy Bilateral 01/19/2015    Procedure: XI ROBOTIC  ASSISTED TOTAL HYSTERECTOMY WITH BILATERAL SALPINGO OOPHORECTOMY;  Surgeon: [name], MD;  Location: [facility];  Service: Gynecology;  Laterality: Bilateral;    FAMILY HISTORY: family history includes Diabetes in her father.  SOCIAL HISTORY:  reports that she has never smoked. She does not have any smokeless tobacco history on file. She reports that she does not drink alcohol or use illicit drugs.  ALLERGIES: Review of patient's allergies indicates no known allergies.  MEDICATIONS:  Current Outpatient Prescriptions  Medication Sig Dispense Refill  . acetaminophen (TYLENOL) 325 MG tablet Take 650 mg by mouth every 6 (six) hours as needed.    Marland Kitchen atenolol-chlorthalidone (TENORETIC) 50-25 MG per tablet Take 1 tablet by mouth every morning.     . cholecalciferol (VITAMIN D) 400 UNITS TABS tablet Take 800 Units by mouth daily.    . ferrous fumarate (HEMOCYTE - 106 MG FE) 325 (106 FE) MG TABS tablet Take 1 tablet daily  on an empty stomach with OJ or vitamin C tablet 30 each 3  . glipiZIDE (GLUCOTROL XL) 5 MG 24 hr tablet Take 5 mg by mouth 2 (two) times daily.    Marland Kitchen latanoprost (XALATAN) 0.005 % ophthalmic solution Place 1 drop into both eyes at bedtime.    Marland Kitchen lisinopril (PRINIVIL,ZESTRIL) 10 MG tablet Take 10 mg by mouth at bedtime.    . metFORMIN (GLUCOPHAGE) 500 MG tablet Take 500 mg by mouth 2 (two) times daily with a meal.    . Multiple Vitamin (MULTIVITAMIN WITH MINERALS) TABS tablet Take 1 tablet by mouth daily.    . pantoprazole (PROTONIX) 40 MG tablet TAKE ONE TABLET BY MOUTH ONCE DAILY 30 tablet 2  . pravastatin (PRAVACHOL) 20 MG tablet Take 20 mg by mouth daily.    . timolol (BETIMOL)  0.5 % ophthalmic solution Place 1 drop into both eyes 2 (two) times daily.    Marland Kitchen ibuprofen (ADVIL,MOTRIN) 200 MG tablet Take 200 mg by mouth every 6 (six) hours as needed (as needed for bady aches after treatment).    . LORazepam (ATIVAN) 0.5 MG tablet Place 1 tablet under tongue or swallow every 6 hours as  needed for nausea. Will make drowsy. (Patient not taking: Reported on 07/17/2015) 20 tablet 0  . ondansetron (ZOFRAN) 8 MG tablet Take 1 tablet by mouth every 8 hours as needed for nausea. Will not make drowsy. (Patient not taking: Reported on 06/15/2015) 30 tablet 1   No current facility-administered medications for this encounter.    REVIEW OF SYSTEMS:  A 15 point review of systems is documented in the electronic medical record. This was obtained by the nursing staff. However, I reviewed this with the patient to discuss relevant findings and make appropriate changes.  Pertinent items are noted in HPI. She experienced some bleeding in the beginning, but not any at this time. Denies abdominal pain, vaginal discharge, or changes in appetite. Complains of gas, abdominal bloating, and some fatigue. She presents to the clinic with her daughter who states the chemotherapy has taken a toll on her mother. The pt denies any back problems. The pt reports a numbing sensation in her hands and feet. This numbing was not present before she started chemotherapy.  PHYSICAL EXAM:  height is 5\' 1"  (1.549 m) and weight is 125 lb (56.7 kg). Her oral temperature is 97.7 F (36.5 C). Her blood pressure is 161/78 and her pulse is 81. Her respiration is 12.    General: Alert and oriented, in no acute distress HEENT: Head is normocephalic. Extraocular movements are intact. Oropharynx is clear. Neck: Neck is supple, no palpable cervical or supraclavicular lymphadenopathy. Heart: Regular in rate and rhythm with no murmurs, rubs, or gallops. Chest: Clear to auscultation bilaterally, with no rhonchi, wheezes, or rales. Abdomen: Soft, nontender, nondistended, with no rigidity or guarding. Laparoscopic scars that have healed well without palpable signs of recurrence. Extremities: No cyanosis or edema. Lymphatics: see Neck Exam Skin: No concerning lesions. Musculoskeletal: symmetric strength and muscle tone  throughout. Neurologic:  No obvious focalities. Speech is fluent. Coordination is intact. Psychiatric: Judgment and insight are intact. Affect is appropriate. Vaginal Exam: Deferred until planning day.  ECOG = 1  LABORATORY DATA:  Lab Results  Component Value Date   WBC 4.5 07/26/2015   HGB 8.5* 07/26/2015   HCT 26.0* 07/26/2015   MCV 95.7 07/26/2015   PLT 163 07/26/2015   NEUTROABS 2.6 07/26/2015   Lab Results  Component Value Date   NA 133* 06/26/2015   K 4.5 06/26/2015   CL 97 01/20/2015   CO2 22 06/26/2015   GLUCOSE 125 06/26/2015   CREATININE 0.8 06/26/2015   CALCIUM 8.5 06/26/2015      RADIOGRAPHY: Ct Abdomen Pelvis W Contrast  07/07/2015   CLINICAL DATA:  Followup endometrial carcinoma. Recently completed chemotherapy. Restaging.  EXAM: CT ABDOMEN AND PELVIS WITH CONTRAST  TECHNIQUE: Multidetector CT imaging of the abdomen and pelvis was performed using the standard protocol following bolus administration of intravenous contrast.  CONTRAST:  98mL OMNIPAQUE IOHEXOL 300 MG/ML  SOLN  COMPARISON:  01/13/2015  FINDINGS: Lower Chest: No acute findings.  Hepatobiliary: Mild diffuse hepatic steatosis again noted. No liver masses identified. Gallbladder is unremarkable.  Pancreas: No mass, inflammatory changes, or other significant abnormality identified.  Spleen:  Within normal limits in size  and appearance.  Adrenals:  No masses identified.  Kidneys/Urinary Tract:  No evidence of masses or hydronephrosis.  Stomach/Bowel/Peritoneum: No evidence of wall thickening, mass, or obstruction.  Vascular/Lymphatic: No pathologically enlarged lymph nodes identified. No abdominal aortic aneurysm or other significant retroperitoneal abnormality demonstrated.  Reproductive: Interval hysterectomy. No masses or fluid collections seen within the hysterectomy bed or elsewhere within the pelvis.  Other:  None.  Musculoskeletal:  No suspicious bone lesions identified.  IMPRESSION: Interval hysterectomy. No  evidence of residual or metastatic carcinoma within the pelvis or abdomen.  Mild diffuse hepatic steatosis incidentally noted.   Electronically Signed   By: Earle Gell M.D.   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 The patient is at risk for vaginal cuff recurrence based on pathology and staging. I would agree with gyn/onc for the recommendation of vaginal cuff bracytherapy. I discussed with the patient and her daughter that this will reduce her risk for vaginal recurrence, however, will not have impact on overall survival or distant recurrence.   I discussed the treatment course, side effects, and potential of long term toxicities of vaginal vault brachytherapy with the patient and her daughter. She appears to understands this procedure and would like to proceed.   PLAN: The patient  completed chemotherapy approximately 1 month ago, therefore she is ready to begin treatment soon. We expect the the pt to have 5 separate treatments of vaginal cuff radiotherapy, twice a week if scheduling permits. The patient signed a consent form and this was placed in her medical chart.    This document serves as a record of services personally performed by Gery Pray, MD. It was created on his behalf by Darcus Austin, a trained medical scribe. The creation of this record is based on the scribe's personal observations and the provider's statements to them. This document has been checked and approved by the attending provider.  ------------------------------------------------  Blair Promise, PhD, MD

## 2015-07-26 NOTE — Telephone Encounter (Signed)
Thank you, Lacie  Since chemo is completed and hgb is some better, would repeat Hgb and iron studies on 8-24 when she sees Dr Sondra Come. If Hgb lower or more symptoms then, fine for 1 unit PRBCs. If iron low then (was ok 4 months ago) would consider IV iron. Continue po iron for now.  POF and orders for labs in. RN please let patient know and watch for lab results on 8-24  thanks

## 2015-07-27 ENCOUNTER — Ambulatory Visit (HOSPITAL_COMMUNITY)
Admission: RE | Admit: 2015-07-27 | Discharge: 2015-07-27 | Disposition: A | Discharge status: Home / Self Care | Payer: 59 | Source: Ambulatory Visit | Attending: Oncology | Admitting: Oncology

## 2015-07-27 VITALS — BP 130/54 | HR 66 | Temp 98.3°F | Resp 16

## 2015-07-27 DIAGNOSIS — C55 Malignant neoplasm of uterus, part unspecified: Secondary | ICD-10-CM

## 2015-07-27 DIAGNOSIS — D649 Anemia, unspecified: Secondary | ICD-10-CM

## 2015-07-27 LAB — PREPARE RBC (CROSSMATCH)

## 2015-07-27 MED ORDER — ACETAMINOPHEN 325 MG PO TABS
650.0000 mg | ORAL_TABLET | Freq: Once | ORAL | Status: AC
  Administered 2015-07-27: 650 mg via ORAL
  Filled 2015-07-27: qty 2

## 2015-07-27 MED ORDER — SODIUM CHLORIDE 0.9 % IV SOLN
250.0000 mL | Freq: Once | INTRAVENOUS | Status: AC
  Administered 2015-07-27: 250 mL via INTRAVENOUS

## 2015-07-27 NOTE — Progress Notes (Signed)
Diagnosis: Anemia 10-CM: D64-9  MD: Dr. Marko Plume  Procedure: Pt infused 1 unit of PRBC per peripheral IV site  Condition during procedure: Tolerated well  Condition post procedure: Pt alert, oriented and ambulatorty

## 2015-07-30 LAB — TYPE AND SCREEN
ABO/RH(D): B POS
Antibody Screen: NEGATIVE
Unit division: 0
Unit division: 0

## 2015-08-02 ENCOUNTER — Telehealth: Payer: Self-pay | Admitting: *Deleted

## 2015-08-02 NOTE — Telephone Encounter (Signed)
CALLED PATIENT TO INFORM OF NEW HDR VAG. CUFF CASE, LVM FOR A RETURN CALL

## 2015-08-07 ENCOUNTER — Other Ambulatory Visit: Payer: Self-pay | Admitting: Oncology

## 2015-08-07 DIAGNOSIS — C541 Malignant neoplasm of endometrium: Secondary | ICD-10-CM

## 2015-08-14 ENCOUNTER — Telehealth: Payer: Self-pay | Admitting: Oncology

## 2015-08-14 ENCOUNTER — Ambulatory Visit (HOSPITAL_BASED_OUTPATIENT_CLINIC_OR_DEPARTMENT_OTHER): Payer: 59 | Admitting: Oncology

## 2015-08-14 ENCOUNTER — Encounter: Payer: Self-pay | Admitting: Oncology

## 2015-08-14 ENCOUNTER — Other Ambulatory Visit (HOSPITAL_BASED_OUTPATIENT_CLINIC_OR_DEPARTMENT_OTHER): Payer: 59

## 2015-08-14 VITALS — BP 146/68 | HR 84 | Temp 97.5°F | Resp 18 | Ht 61.0 in | Wt 125.5 lb

## 2015-08-14 DIAGNOSIS — C541 Malignant neoplasm of endometrium: Secondary | ICD-10-CM

## 2015-08-14 DIAGNOSIS — G620 Drug-induced polyneuropathy: Secondary | ICD-10-CM

## 2015-08-14 DIAGNOSIS — D6481 Anemia due to antineoplastic chemotherapy: Secondary | ICD-10-CM

## 2015-08-14 DIAGNOSIS — T451X5A Adverse effect of antineoplastic and immunosuppressive drugs, initial encounter: Secondary | ICD-10-CM

## 2015-08-14 DIAGNOSIS — E119 Type 2 diabetes mellitus without complications: Secondary | ICD-10-CM

## 2015-08-14 LAB — CBC WITH DIFFERENTIAL/PLATELET
BASO%: 0.6 % (ref 0.0–2.0)
BASOS ABS: 0 10*3/uL (ref 0.0–0.1)
EOS%: 1.8 % (ref 0.0–7.0)
Eosinophils Absolute: 0.1 10*3/uL (ref 0.0–0.5)
HEMATOCRIT: 33 % — AB (ref 34.8–46.6)
HGB: 11 g/dL — ABNORMAL LOW (ref 11.6–15.9)
LYMPH#: 1.8 10*3/uL (ref 0.9–3.3)
LYMPH%: 31.7 % (ref 14.0–49.7)
MCH: 31.3 pg (ref 25.1–34.0)
MCHC: 33.4 g/dL (ref 31.5–36.0)
MCV: 94 fL (ref 79.5–101.0)
MONO#: 0.5 10*3/uL (ref 0.1–0.9)
MONO%: 8.6 % (ref 0.0–14.0)
NEUT#: 3.3 10*3/uL (ref 1.5–6.5)
NEUT%: 57.3 % (ref 38.4–76.8)
Platelets: 157 10*3/uL (ref 145–400)
RBC: 3.51 10*6/uL — AB (ref 3.70–5.45)
RDW: 13.7 % (ref 11.2–14.5)
WBC: 5.8 10*3/uL (ref 3.9–10.3)

## 2015-08-14 LAB — COMPREHENSIVE METABOLIC PANEL (CC13)
ALT: 18 U/L (ref 0–55)
ANION GAP: 8 meq/L (ref 3–11)
AST: 15 U/L (ref 5–34)
Albumin: 4 g/dL (ref 3.5–5.0)
Alkaline Phosphatase: 64 U/L (ref 40–150)
BUN: 12.1 mg/dL (ref 7.0–26.0)
CALCIUM: 9 mg/dL (ref 8.4–10.4)
CHLORIDE: 104 meq/L (ref 98–109)
CO2: 26 meq/L (ref 22–29)
Creatinine: 0.8 mg/dL (ref 0.6–1.1)
EGFR: 75 mL/min/{1.73_m2} — ABNORMAL LOW (ref >90)
Glucose: 216 mg/dl — ABNORMAL HIGH (ref 70–140)
POTASSIUM: 4.3 meq/L (ref 3.5–5.1)
Sodium: 138 mEq/L (ref 136–145)
Total Bilirubin: 0.33 mg/dL (ref 0.20–1.20)
Total Protein: 6.8 g/dL (ref 6.4–8.3)

## 2015-08-14 MED ORDER — GABAPENTIN 100 MG PO CAPS: 100.0000 mg | ORAL_CAPSULE | Freq: Every day | ORAL | Status: DC

## 2015-08-14 NOTE — Progress Notes (Signed)
OFFICE PROGRESS NOTE   August 14, 2015   Physicians:[name]/ [name];  [name], MD (PCP, [facility]), Aloha Gell  INTERVAL HISTORY:   Patient is seen, alone for visit, in follow up of adjuvant chemotherapy for IIIA grade 3 serous endometrial carcinoma, with carboplatin taxol given from 02-16-15 thru 06-16-15. CT AP 07-07-15 and follow up exam by Dr Denman George on 07-17-15 found no evidence of disease and HDR planned by Dr Sondra Come from 08-17-15 thru 09-14-15.  Patient was transfused 1 unit PRBCs on 07-26-15 for symptomatic anemia related to the chemotherapy, with improvement in symptoms. Energy continues to improve, with patient more active at home and planning to resume yoga. She has had no bleeding. Peripheral neuropathy is mild in fingertips and still very noticeable in feet; she did not understand previous recommendation to wear athletic shoes at home instead of flat slippers, but will do that now. She would like to try gabapentin at hs for neuropathy symptoms in feet, and may benefit from seeing podiatry (she prefers referral from PCP due to her insurance). Appetite is better, bowels moving regularly without laxative, no abdominal or pelvic discomfort.    No PAC No genetics testing Declined flu vaccine today  ONCOLOGIC HISTORY  Patient presented to Dr [name] 01-03-15 with vaginal spotting x 1 year. Pelvic US showed 33 mm endometrial stripe with ovaries normal, and endometrial biopsy also 01-03-15 reportedly had grade 3 endometrial adenocarcinoma. She was seen in consultation by Dr Denman George on 2-8-1. CT CAP 01-13-15 had negative chest, no adenopathy, mild fatty liver, markedly thickened endometrium and no evidence of metastatic disease outside of uterus. CA 125 preoperatively on 01-13-15 was 22.9. Surgery by Dr [name] 01-19-15 was robotic hysterectomy, BSO and resection of nodule at sigmoid serosa. At surgery there was milial tumor studding with 5-10 mm implants on bladder peritoneum and  miliary disease on pelvic sidewalls, without upper abdominal disease evident, and omentum appeared normal. 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 She was seen for post operative follow up by Dr Denman George on 02-03-15, with recommendation for 6 cycles of taxol carboplatin, then consideration of external beam RT + vaginal brachytherapy if CR. First carboplatin taxol was given 02-16-2015; she was neutropenic with ANC 0.7 by day 15 cycle 1. She was transfused 2 units PRBCs 5-19/5-20-16 for Hgb 7.8. Cycles 4 and 5 were both delayed due to thrombocytopenia, carbo AUC adjusted  Review of systems as above, also: Not watching blood sugars or diet closely over last several weeks but will resume. No bleeding. No LE swelling. No SOB Remainder of 10 point Review of Systems negative.  Objective:  Vital signs in last 24 hours:  BP 146/68 mmHg  Pulse 84  Temp(Src) 97.5 F (36.4 C) (Oral)  Resp 18  Ht 5' 1"  (1.549 m)  Wt 125 lb 8 oz (56.926 kg)  BMI 23.73 kg/m2  SpO2 100% Weight up 3 lbs. Appears comfortable, respirations not labored RA Alert, oriented and appropriate. Ambulatory without difficulty.   HEENT:PERRL, sclerae not icteric. Oral mucosa moist without lesions, posterior pharynx clear.  Neck supple. No JVD.  Lymphatics:no cervical,supraclavicular, axillary or inguinal adenopathy Resp: clear to auscultation bilaterally and normal percussion bilaterally Cardio: regular rate and rhythm. No gallop. GI: soft, nontender, not distended, no mass or organomegaly. Normally active bowel sounds. Surgical incision not remarkable. Musculoskeletal/ Extremities: without pitting edema, cords, tenderness Neuro: peripheral neuropathy as noted.  Otherwise nonfocal. Psych appropriate mood and affect Skin without rash, ecchymosis, petechiae   Lab Results:  Results for  orders placed or performed in visit on 08/14/15  CBC with Differential  Result Value Ref Range   WBC 5.8 3.9 - 10.3 10e3/uL   NEUT# 3.3 1.5 - 6.5 10e3/uL   HGB 11.0 (L) 11.6 - 15.9 g/dL   HCT 33.0 (L) 34.8 - 46.6 %   Platelets 157 145 - 400 10e3/uL   MCV 94.0 79.5 - 101.0 fL   MCH 31.3 25.1 - 34.0 pg   MCHC 33.4 31.5 - 36.0 g/dL   RBC 3.51 (L) 3.70 - 5.45 10e6/uL   RDW 13.7 11.2 - 14.5 %   lymph# 1.8 0.9 - 3.3 10e3/uL   MONO# 0.5 0.1 - 0.9 10e3/uL   Eosinophils Absolute 0.1 0.0 - 0.5 10e3/uL   Basophils Absolute 0.0 0.0 - 0.1 10e3/uL   NEUT% 57.3 38.4 - 76.8 %   LYMPH% 31.7 14.0 - 49.7 %   MONO% 8.6 0.0 - 14.0 %   EOS% 1.8 0.0 - 7.0 %   BASO% 0.6 0.0 - 2.0 %  Comprehensive metabolic panel (Cmet) - [facility]  Result Value Ref Range   Sodium 138 136 - 145 mEq/L   Potassium 4.3 3.5 - 5.1 mEq/L   Chloride 104 98 - 109 mEq/L   CO2 26 22 - 29 mEq/L   Glucose 216 (H) 70 - 140 mg/dl   BUN 12.1 7.0 - 26.0 mg/dL   Creatinine 0.8 0.6 - 1.1 mg/dL   Total Bilirubin 0.33 0.20 - 1.20 mg/dL   Alkaline Phosphatase 64 40 - 150 U/L   AST 15 5 - 34 U/L   ALT 18 0 - 55 U/L   Total Protein 6.8 6.4 - 8.3 g/dL   Albumin 4.0 3.5 - 5.0 g/dL   Calcium 9.0 8.4 - 10.4 mg/dL   Anion Gap 8 3 - 11 mEq/L   EGFR 75 (L) >90 ml/min/1.73 m2    CA 125 available after visit 9, this having been 38 on 02-09-15 and 22.9 preop   Studies/Results: EXAM: CT ABDOMEN AND PELVIS WITH CONTRAST  07-07-15  COMPARISON: 01/13/2015  FINDINGS: Lower Chest: No acute findings.  Hepatobiliary: Mild diffuse hepatic steatosis again noted. No liver masses identified. Gallbladder is unremarkable.  Pancreas: No mass, inflammatory changes, or other significant abnormality identified.  Spleen: Within normal limits in size and appearance.  Adrenals: No masses identified.  Kidneys/Urinary Tract: No evidence of masses or hydronephrosis.  Stomach/Bowel/Peritoneum: No evidence of wall thickening, mass,  or obstruction.  Vascular/Lymphatic: No pathologically enlarged lymph nodes identified. No abdominal aortic aneurysm or other significant retroperitoneal abnormality demonstrated.  Reproductive: Interval hysterectomy. No masses or fluid collections seen within the hysterectomy bed or elsewhere within the pelvis.  Other: None.  Musculoskeletal: No suspicious bone lesions identified.  IMPRESSION: Interval hysterectomy. No evidence of residual or metastatic carcinoma within the pelvis or abdomen.  Mild diffuse hepatic steatosis incidentally noted.  PACs images reviewed by MD for this appointment  Medications: I have reviewed the patient's current medications. Add gabapentin 100 mg ah hs. She declines flu vaccine  DISCUSSION:  We are delighted that disease has responded well to treatment, and she is in agreement with brachytherapy as planned.  Peripheral neuropathy hopefully will improve out further from chemo, tho underlying diabetes is likely contributing. Will try gabapentin at hs for symptoms, patient aware that this will make her drowsy initially, will not improve the underlying problem but may help  symptoms, could be increased if needed.  Assessment/Plan: 1.IIIA grade 3 serous endometrial carcinoma: residual at least miliary disease at completion of surgery,  carbo taxol x 6 cycles from  02-16-15 thru 06-16-15 with gCSF support. NED by restaging in 07-2015, now for vaginal brachytherapy. She will see Dr Denman George again in Dec and I will see her ~ Feb or sooner if needed.  2.diabetes x 15 years: PCP manages. Encouraged her to resume good diet 3. Chemo peripheral neuropathy: ongoing especially in feet. Change shoes, consider podiatry, try gabapentin 4.Hypertension, generally well controlled 5.anemia: B12 low normal 01-2015 despite vegetarian diet, iron ok then and on oral iron and po B12 now. Transfused in May and late August. Hemoglobin some better today. 7.hyponatremia,  hypochloremia: likely due to diuretic, improved. 8.GERD: improved with protonix 9.declined flu vaccine, tho this would be indicated with diabetes and hopefully PCP can encourage 10.mild diffuse hepatic steatosis by CT  All questions answered. Time spent 25 min including >50% counseling and coordination of care. Will add labs to Dr Serita Grit apt in Dec. Cc Dr Tracey Harries, MD   08/14/2015, 1:16 PM

## 2015-08-14 NOTE — Telephone Encounter (Signed)
Appointments made and avs printed for patient,pof kept for 68month dr Marko Plume appointment   Webb Silversmith

## 2015-08-15 ENCOUNTER — Other Ambulatory Visit: Payer: Self-pay | Admitting: Oncology

## 2015-08-15 DIAGNOSIS — C541 Malignant neoplasm of endometrium: Secondary | ICD-10-CM

## 2015-08-15 LAB — CA 125: CA 125: 9 U/mL (ref <35)

## 2015-08-17 ENCOUNTER — Ambulatory Visit
Admission: RE | Admit: 2015-08-17 | Discharge: 2015-08-17 | Disposition: A | Discharge status: Home / Self Care | Payer: 59 | Source: Ambulatory Visit | Attending: Radiation Oncology | Admitting: Radiation Oncology

## 2015-08-17 ENCOUNTER — Encounter: Payer: Self-pay | Admitting: Radiation Oncology

## 2015-08-17 VITALS — BP 172/76 | HR 84 | Temp 97.9°F | Resp 16 | Ht 61.0 in | Wt 122.8 lb

## 2015-08-17 DIAGNOSIS — C541 Malignant neoplasm of endometrium: Secondary | ICD-10-CM

## 2015-08-17 DIAGNOSIS — Z510 Encounter for antineoplastic radiation therapy: Secondary | ICD-10-CM | POA: Diagnosis not present

## 2015-08-17 NOTE — Progress Notes (Signed)
Radiation Oncology         [phone] ________________________________  Name: Brittany Schwartz MRN: [id number]  Date: 08/17/2015  DOB: [birth year]  Vaginal Brachytherapy Procedure Note  CC: [name], MD  [name], MD   Diagnosis: Stage IIIA Grade 3 serous/endometrioid endometrial cancer  Narrative:  The patient returns today for routine follow-up. She denies pain, bladder/bowel issues, vaginal bleeding, and fatigue. She is here with her daughter and does not want an interpreter.  ALLERGIES:  has No Known Allergies.  Meds: Current Outpatient Prescriptions  Medication Sig Dispense Refill  . atenolol-chlorthalidone (TENORETIC) 50-25 MG per tablet Take 1 tablet by mouth every morning.     . cholecalciferol (VITAMIN D) 400 UNITS TABS tablet Take 800 Units by mouth daily.    . ferrous fumarate (HEMOCYTE - 106 MG FE) 325 (106 FE) MG TABS tablet Take 1 tablet daily  on an empty stomach with OJ or vitamin C tablet 30 each 3  . gabapentin (NEURONTIN) 100 MG capsule Take 1 capsule (100 mg total) by mouth at bedtime. Take 1 at bedtime for neuropathy 30 capsule 1  . glipiZIDE (GLUCOTROL XL) 5 MG 24 hr tablet Take 5 mg by mouth 2 (two) times daily.    Marland Kitchen ibuprofen (ADVIL,MOTRIN) 200 MG tablet Take 200 mg by mouth every 6 (six) hours as needed (as needed for bady aches after treatment).    Marland Kitchen latanoprost (XALATAN) 0.005 % ophthalmic solution Place 1 drop into both eyes at bedtime.    Marland Kitchen lisinopril (PRINIVIL,ZESTRIL) 10 MG tablet Take 10 mg by mouth at bedtime.    . metFORMIN (GLUCOPHAGE) 500 MG tablet Take 500 mg by mouth 2 (two) times daily with a meal.    . Multiple Vitamin (MULTIVITAMIN WITH MINERALS) TABS tablet Take 1 tablet by mouth daily.    . pantoprazole (PROTONIX) 40 MG tablet TAKE ONE TABLET BY MOUTH ONCE DAILY 30 tablet 2  . pravastatin (PRAVACHOL) 20 MG tablet Take 20 mg by mouth daily.    . timolol (BETIMOL) 0.5 % ophthalmic solution Place 1 drop into both eyes 2 (two) times  daily.    Marland Kitchen acetaminophen (TYLENOL) 325 MG tablet Take 650 mg by mouth every 6 (six) hours as needed.    Marland Kitchen LORazepam (ATIVAN) 0.5 MG tablet Place 1 tablet under tongue or swallow every 6 hours as needed for nausea. Will make drowsy. (Patient not taking: Reported on 07/17/2015) 20 tablet 0  . ondansetron (ZOFRAN) 8 MG tablet Take 1 tablet by mouth every 8 hours as needed for nausea. Will not make drowsy. (Patient not taking: Reported on 06/15/2015) 30 tablet 1   No current facility-administered medications for this encounter.    Physical Findings: The patient is in no acute distress. Patient is alert and oriented.  height is 5\' 1"  (1.549 m) and weight is 122 lb 12.8 oz (55.702 kg). Her oral temperature is 97.9 F (36.6 C). Her blood pressure is 172/76 and her pulse is 84. Her respiration is 16.  Lungs are clear to auscultation bilaterally. Heart has regular rate and rhythm. No palpable cervical, supraclavicular, or axillary adenopathy. Pelvic Exam: External genitalia unremarkable. Speculum exam was performed with no mucosal lesions of the vaginal vault. Bimanual examination; there were no pelvic masses appreciated and the vaginal cuff was intact. The patient proceeded to undergo fitting for her custom vaginal cylinder. She will be treated with a 3 cm segmented cylinder. This distended the vaginal vault without undue discomfort.  Lab Findings: Lab Results  Component Value  Date   WBC 5.8 08/14/2015   HGB 11.0* 08/14/2015   HCT 33.0* 08/14/2015   MCV 94.0 08/14/2015   PLT 157 08/14/2015     Impression: Successful fitting for vaginal vault brachytherapy.  Plan: Proceed to CT sim for planning and first HDR treatment later today.  This document serves as a record of services personally performed by Gery Pray, MD. It was created on his behalf by Darcus Austin, a trained medical scribe. The creation of this record is based on the scribe's personal observations and the provider's statements to  them. This document has been checked and approved by the attending provider.  ____________________________________  Blair Promise, PhD, MD

## 2015-08-17 NOTE — Progress Notes (Signed)
Brittany Schwartz here for follow up.  She denies pain, bladder/bowel issues, vaginal bleeding and fatigue.  She is here with her daughter and does not want an interpreter.  BP 172/76 mmHg  Pulse 84  Temp(Src) 97.9 F (36.6 C) (Oral)  Resp 16  Ht 5\' 1"  (1.549 m)  Wt 122 lb 12.8 oz (55.702 kg)  BMI 23.21 kg/m2

## 2015-08-17 NOTE — Progress Notes (Signed)
  Radiation Oncology         [phone] ________________________________  Name: Brittany Schwartz MRN: [id number]  Date: 08/17/2015  DOB: 13-Jul-1951  HDR BRACHYTHERAPY  DIAGNOSIS: Stage IIIA Grade 3 serous/endometrioid endometrial cancer  Simple treatment device Note  NARRATIVE: The patient had construction of her custom vaginal cylinder. She will be treated with a 3 cm segmented cylinder. This diameter cylinder distended the vaginal vault without undue discomfort.   Vaginal brachytherapy procedure note: The patient was brought to the [place] suite.  Identity was confirmed.  All relevant records and images related to the planned course of therapy were reviewed.  The patient freely provided informed written consent to proceed with treatment after reviewing the details related to the planned course of therapy. The consent form was witnessed and verified by the simulation staff.  Then, the patient was set-up in a stable reproducible  supine position for radiation therapy.  the patient's custom vaginal cylinder was placed in the proximal vagina. This was affixed to the CT/MR stabilization plate to prevent slippage. The patient tolerated the placement well.  Verification simulation note:  The patient had a fiducial marker placed within the vaginal cylinder. An AP and lateral film was obtained. This was compared to the patient's planning films showing accurate position of the vaginal cylinder for treatment.  High-dose-rate brachytherapy treatment  Remote afterloading catheter was affixed to the vaginal cylinder. The patient then proceeded to undergo her first high-dose-rate treatment directed at the proximal vagina. The patient was prescribed a dose of 6 Gy to be delivered to the vaginal mucosal surface. Iridium 192 was the high-dose-rate source. Treatment length was 4 cm. A 3 cm diameter cylinder was used to deliver the patient's treatment. Total treatment time was 355.5 seconds. The patient tolerated  the treatment well. After completion of her therapy a radiation survey was performed documenting return of the iridium source into the gamma med safe.  This document serves as a record of services personally performed by Gery Pray, MD. It was created on his behalf by Darcus Austin, a trained medical scribe. The creation of this record is based on the scribe's personal observations and the provider's statements to them. This document has been checked and approved by the attending provider.  ______________________________  Blair Promise, PhD, MD

## 2015-08-17 NOTE — Progress Notes (Addendum)
  Radiation Oncology         [phone] ________________________________  Name: Brittany Schwartz MRN: [id number]  Date: 08/17/2015  DOB: [birth year]  SIMULATION AND TREATMENT PLANNING NOTE HDR BRACHYTHERAPY  DIAGNOSIS: Stage IIIA Grade 3 serous/endometrioid endometrial cancer  NARRATIVE:  The patient was brought to the [facility] suite.  Identity was confirmed.  All relevant records and images related to the planned course of therapy were reviewed.  The patient freely provided informed written consent to proceed with treatment after reviewing the details related to the planned course of therapy. The consent form was witnessed and verified by the simulation staff.  Then, the patient was set-up in a stable reproducible  supine position for radiation therapy.  CT images were obtained.  Surface markings were placed.  The CT images were loaded into the planning software.  Then the target and avoidance structures were contoured.  Treatment planning then occurred.  The radiation prescription was entered and confirmed.   I have requested : Brachytherapy Isodose Plan and Dosimetry Calculations to plan the radiation distribution.    PLAN:  The patient will receive 30 Gy in 5 fractions.  This document serves as a record of services personally performed by Gery Pray, MD. It was created on his behalf by Darcus Austin, a trained medical scribe. The creation of this record is based on the scribe's personal observations and the provider's statements to them. This document has been checked and approved by the attending provider.  ________________________________  Blair Promise, PhD, MD

## 2015-08-17 NOTE — Addendum Note (Signed)
Encounter addended by: Gery Pray, MD on: 08/17/2015 11:46 AM<BR>     Documentation filed: Notes Section

## 2015-08-22 ENCOUNTER — Encounter: Payer: Self-pay | Admitting: Radiation Oncology

## 2015-08-22 ENCOUNTER — Telehealth: Payer: Self-pay | Admitting: *Deleted

## 2015-08-22 NOTE — Telephone Encounter (Signed)
CALLED PATIENT TO REMIND OF HDR TX. FOR 08-23-15 @ 10 AM , LVM FOR A RETURN CALL

## 2015-08-23 ENCOUNTER — Ambulatory Visit
Admission: RE | Admit: 2015-08-23 | Discharge: 2015-08-23 | Disposition: A | Discharge status: Home / Self Care | Payer: 59 | Source: Ambulatory Visit | Attending: Radiation Oncology | Admitting: Radiation Oncology

## 2015-08-23 DIAGNOSIS — Z510 Encounter for antineoplastic radiation therapy: Secondary | ICD-10-CM | POA: Diagnosis not present

## 2015-08-23 DIAGNOSIS — C541 Malignant neoplasm of endometrium: Secondary | ICD-10-CM

## 2015-08-23 NOTE — Progress Notes (Signed)
  Radiation Oncology         [phone] ________________________________  Name: Brittany Schwartz MRN: [id number]  Date: 08/23/2015  DOB: 1951-01-22  HDR BRACHYTHERAPY  DIAGNOSIS: Stage IIIA Grade 3 serous/endometrioid endometrial cancer  Simple treatment device Note  NARRATIVE: The patient had construction of her custom vaginal cylinder. She will be treated with a 3 cm segmented cylinder. This diameter cylinder distended the vaginal vault without undue discomfort.   Vaginal brachytherapy procedure note: The patient was brought to the [place] suite.  Identity was confirmed.  All relevant records and images related to the planned course of therapy were reviewed.  The patient freely provided informed written consent to proceed with treatment after reviewing the details related to the planned course of therapy. The consent form was witnessed and verified by the simulation staff.  Then, the patient was set-up in a stable reproducible  supine position for radiation therapy.  the patient's custom vaginal cylinder was placed in the proximal vagina. This was affixed to the CT/MR stabilization plate to prevent slippage. The patient tolerated the placement well.  Verification simulation note:  The patient had a fiducial marker placed within the vaginal cylinder. An AP and lateral film was obtained. This was compared to the patient's planning films showing accurate position of the vaginal cylinder for treatment.  High-dose-rate brachytherapy treatment  Remote afterloading catheter was affixed to the vaginal cylinder. The patient then proceeded to undergo her second high-dose-rate treatment directed at the proximal vagina. The patient was prescribed a dose of 6 Gy to be delivered to the vaginal mucosal surface. Iridium 192 was the high-dose-rate source. Treatment length was 4 cm. A 3 cm diameter cylinder was used to deliver the patient's treatment. Total treatment time was 376.0 seconds. The patient tolerated  the treatment well. After completion of her therapy a radiation survey was performed documenting return of the iridium source into the gamma med safe.  This document serves as a record of services personally performed by Gery Pray, MD. It was created on his behalf by Arlyce Harman, a trained medical scribe. The creation of this record is based on the scribe's personal observations and the provider's statements to them. This document has been checked and approved by the attending provider. ______________________________  Blair Promise, PhD, MD

## 2015-08-29 ENCOUNTER — Telehealth: Payer: Self-pay | Admitting: *Deleted

## 2015-08-29 ENCOUNTER — Encounter: Payer: Self-pay | Admitting: Radiation Oncology

## 2015-08-29 NOTE — Telephone Encounter (Signed)
CALLED PATIENT TO REMIND OF HDR TX. ON 08-30-15 @10  AM, SPOKE WITH PATIENT'S DAUGHTER AND SHE IS AWARE OF THIS Gregory.

## 2015-08-30 ENCOUNTER — Ambulatory Visit
Admission: RE | Admit: 2015-08-30 | Discharge: 2015-08-30 | Disposition: A | Discharge status: Home / Self Care | Payer: 59 | Source: Ambulatory Visit | Attending: Radiation Oncology | Admitting: Radiation Oncology

## 2015-08-30 DIAGNOSIS — Z510 Encounter for antineoplastic radiation therapy: Secondary | ICD-10-CM | POA: Diagnosis not present

## 2015-08-30 DIAGNOSIS — C541 Malignant neoplasm of endometrium: Secondary | ICD-10-CM

## 2015-08-30 NOTE — Progress Notes (Signed)
  Radiation Oncology         [phone] ________________________________  Name: Brittany Schwartz MRN: [id number]  Date: 08/30/2015  DOB: [birth year]  HDR BRACHYTHERAPY  DIAGNOSIS: Stage IIIA Grade 3 serous/endometrioid endometrial cancer  Simple treatment device Note  NARRATIVE: The patient had construction of her custom vaginal cylinder. She will be treated with a 3 cm segmented cylinder. This diameter cylinder distended the vaginal vault without undue discomfort.   Vaginal brachytherapy procedure note: The patient was brought to the [facility] suite.  Identity was confirmed.  All relevant records and images related to the planned course of therapy were reviewed.  The patient freely provided informed written consent to proceed with treatment after reviewing the details related to the planned course of therapy. The consent form was witnessed and verified by the simulation staff.  Then, the patient was set-up in a stable reproducible  supine position for radiation therapy.  the patient's custom vaginal cylinder was placed in the proximal vagina. This was affixed to the CT/MR stabilization plate to prevent slippage. The patient tolerated the placement well.  Verification simulation note:  The patient had a fiducial marker placed within the vaginal cylinder. An AP and lateral film was obtained. This was compared to the patient's planning films showing accurate position of the vaginal cylinder for treatment.  High-dose-rate brachytherapy treatment  Remote afterloading catheter was affixed to the vaginal cylinder. The patient then proceeded to undergo her third high-dose-rate treatment directed at the proximal vagina. The patient was prescribed a dose of 6 Gy to be delivered to the vaginal mucosal surface. Iridium 192 was the high-dose-rate source. Treatment length was 4 cm. A 3 cm diameter cylinder was used to deliver the patient's treatment. Total treatment time was 401.6 seconds. The patient tolerated  the treatment well. After completion of her therapy a radiation survey was performed documenting return of the iridium source into the gamma med safe.  ______________________________  Blair Promise, PhD, MD  This document serves as a record of services personally performed by Gery Pray, MD. It was created on his behalf by Derek Mound, a trained medical scribe. The creation of this record is based on the scribe's personal observations and the Laronica Bhagat's statements to them. This document has been checked and approved by the attending Evangelina Delancey.

## 2015-09-05 ENCOUNTER — Encounter: Payer: Self-pay | Admitting: Radiation Oncology

## 2015-09-11 ENCOUNTER — Telehealth: Payer: Self-pay | Admitting: *Deleted

## 2015-09-11 ENCOUNTER — Other Ambulatory Visit: Payer: Self-pay

## 2015-09-11 DIAGNOSIS — D509 Iron deficiency anemia, unspecified: Secondary | ICD-10-CM

## 2015-09-11 MED ORDER — FERROUS FUMARATE 325 (106 FE) MG PO TABS: ORAL_TABLET | ORAL | Status: AC

## 2015-09-11 NOTE — Telephone Encounter (Signed)
CALLED PATIENT TO REMIND  OF HDR TX. FOR 09-12-15 @ 9 AM, LVM FOR A RETURN CALL

## 2015-09-12 ENCOUNTER — Ambulatory Visit
Admission: RE | Admit: 2015-09-12 | Discharge: 2015-09-12 | Disposition: A | Discharge status: Home / Self Care | Payer: 59 | Source: Ambulatory Visit | Attending: Radiation Oncology | Admitting: Radiation Oncology

## 2015-09-12 ENCOUNTER — Encounter: Payer: Self-pay | Admitting: Radiation Oncology

## 2015-09-12 DIAGNOSIS — C541 Malignant neoplasm of endometrium: Secondary | ICD-10-CM

## 2015-09-12 DIAGNOSIS — Z510 Encounter for antineoplastic radiation therapy: Secondary | ICD-10-CM | POA: Diagnosis not present

## 2015-09-12 NOTE — Progress Notes (Signed)
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 She will be treated with a 3 cm segmented cylinder. This diameter cylinder distended the vaginal vault without undue discomfort.   Vaginal brachytherapy procedure note: The patient was brought to the [place] suite. Identity was confirmed. All relevant records and images related to the planned course of therapy were reviewed. The patient freely provided informed written consent to proceed with treatment after reviewing the details related to the planned course of therapy. The consent form was witnessed and verified by the simulation staff. Then, the patient was set-up in a stable reproducible supine position for radiation therapy. the patient's custom vaginal cylinder was placed in the proximal vagina. This was affixed to the CT/MR stabilization plate to prevent slippage. The patient tolerated the placement well.  Verification simulation note:  The patient had a fiducial marker placed within the vaginal cylinder. An AP and lateral film was obtained. This was compared to the patient's planning films showing accurate position of the vaginal cylinder for treatment.  High-dose-rate brachytherapy treatment  Remote afterloading catheter was affixed to the vaginal cylinder. The patient then proceeded to undergo her fourth high-dose-rate treatment directed at the proximal vagina. The patient was prescribed a dose of 6 Gy to be delivered to the vaginal mucosal surface. Iridium 192 was the high-dose-rate source. Treatment length was 4 cm. A 3 cm diameter cylinder was used to deliver  the patient's treatment. Total treatment time was 208.7 seconds. The patient tolerated the treatment well. After completion of her therapy a radiation survey was performed documenting return of the iridium source into the gamma med safe.  ______________________________

## 2015-09-13 ENCOUNTER — Other Ambulatory Visit: Payer: Self-pay

## 2015-09-13 ENCOUNTER — Telehealth: Payer: Self-pay | Admitting: *Deleted

## 2015-09-13 DIAGNOSIS — C541 Malignant neoplasm of endometrium: Secondary | ICD-10-CM

## 2015-09-13 NOTE — Telephone Encounter (Signed)
CALLED PATIENT TO REMIND OF HDR TX. FOR 09-14-15, SPOKE WITH PATIENT AND SHE IS AWARE OF THIS APPT.

## 2015-09-14 ENCOUNTER — Ambulatory Visit
Admission: RE | Admit: 2015-09-14 | Discharge: 2015-09-14 | Disposition: A | Discharge status: Home / Self Care | Payer: 59 | Source: Ambulatory Visit | Attending: Radiation Oncology | Admitting: Radiation Oncology

## 2015-09-14 ENCOUNTER — Other Ambulatory Visit (HOSPITAL_BASED_OUTPATIENT_CLINIC_OR_DEPARTMENT_OTHER): Payer: 59

## 2015-09-14 ENCOUNTER — Encounter: Payer: Self-pay | Admitting: Radiation Oncology

## 2015-09-14 DIAGNOSIS — D649 Anemia, unspecified: Secondary | ICD-10-CM | POA: Diagnosis not present

## 2015-09-14 DIAGNOSIS — Z510 Encounter for antineoplastic radiation therapy: Secondary | ICD-10-CM | POA: Diagnosis not present

## 2015-09-14 DIAGNOSIS — C541 Malignant neoplasm of endometrium: Secondary | ICD-10-CM

## 2015-09-14 LAB — CBC WITH DIFFERENTIAL/PLATELET
BASO%: 0.4 % (ref 0.0–2.0)
BASOS ABS: 0 10*3/uL (ref 0.0–0.1)
EOS ABS: 0.1 10*3/uL (ref 0.0–0.5)
EOS%: 1.3 % (ref 0.0–7.0)
HEMATOCRIT: 33.7 % — AB (ref 34.8–46.6)
HGB: 11.3 g/dL — ABNORMAL LOW (ref 11.6–15.9)
LYMPH%: 34 % (ref 14.0–49.7)
MCH: 31.1 pg (ref 25.1–34.0)
MCHC: 33.6 g/dL (ref 31.5–36.0)
MCV: 92.6 fL (ref 79.5–101.0)
MONO#: 0.5 10*3/uL (ref 0.1–0.9)
MONO%: 9.8 % (ref 0.0–14.0)
NEUT#: 2.6 10*3/uL (ref 1.5–6.5)
NEUT%: 54.5 % (ref 38.4–76.8)
PLATELETS: 152 10*3/uL (ref 145–400)
RBC: 3.64 10*6/uL — ABNORMAL LOW (ref 3.70–5.45)
RDW: 12.9 % (ref 11.2–14.5)
WBC: 4.9 10*3/uL (ref 3.9–10.3)
lymph#: 1.7 10*3/uL (ref 0.9–3.3)

## 2015-09-14 NOTE — Progress Notes (Signed)
  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 She will be treated with a 3 cm segmented cylinder. This diameter cylinder distended the vaginal vault without undue discomfort.   Vaginal brachytherapy procedure note: The patient was brought to the [place] suite. Identity was confirmed. All relevant records and images related to the planned course of therapy were reviewed. The patient freely provided informed written consent to proceed with treatment after reviewing the details related to the planned course of therapy. The consent form was witnessed and verified by the simulation staff. Then, the patient was set-up in a stable reproducible supine position for radiation therapy. the patient's custom vaginal cylinder was placed in the proximal vagina. This was affixed to the CT/MR stabilization plate to prevent slippage. The patient tolerated the placement well.  Verification simulation note:  The patient had a fiducial marker placed within the vaginal cylinder. An AP and lateral film was obtained. This was compared to the patient's planning films showing accurate position of the vaginal cylinder for treatment.  High-dose-rate brachytherapy treatment  Remote afterloading catheter was affixed to the vaginal cylinder. The patient then proceeded to undergo her fifth high-dose-rate treatment directed at the proximal vagina. The patient was prescribed a dose of 6 Gy to be delivered to the vaginal mucosal surface. Iridium 192 was the high-dose-rate source. Treatment length was 4 cm. A 3 cm diameter cylinder was used to deliver the patient's treatment. Total treatment time was 212.8 seconds. The patient tolerated  the treatment well. After completion of her therapy a radiation survey was performed documenting return of the iridium source into the gamma med safe.  This document serves as a record of services personally performed by Gery Pray, MD. It was created on his behalf by Darcus Austin, a trained medical scribe. The creation of this record is based on the scribe's personal observations and the provider's statements to them. This document has been checked and approved by the attending provider.   Blair Promise, PhD, MD

## 2015-09-17 NOTE — Progress Notes (Signed)
  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 Patient was treated with a 3 cm segmented cylinder, 4 cm treatment length,  prescription to the mucosal surface  Narrative: The patient tolerated radiation treatment relatively well.   Minimal vaginal discomfort with procedure, no urinary symptoms.  Plan: The patient has completed radiation treatment. The patient will return to radiation oncology clinic for routine followup in one month. I advised them to call or return sooner if they have any questions or concerns related to their recovery or treatment.  -----------------------------------  Blair Promise, PhD, MD

## 2015-09-19 ENCOUNTER — Encounter: Payer: Self-pay | Admitting: Radiation Oncology

## 2015-11-20 ENCOUNTER — Encounter: Payer: Self-pay | Admitting: Gynecologic Oncology

## 2015-11-20 ENCOUNTER — Ambulatory Visit: Payer: 59 | Attending: Gynecologic Oncology | Admitting: Gynecologic Oncology

## 2015-11-20 ENCOUNTER — Telehealth: Payer: Self-pay | Admitting: Oncology

## 2015-11-20 ENCOUNTER — Other Ambulatory Visit (HOSPITAL_BASED_OUTPATIENT_CLINIC_OR_DEPARTMENT_OTHER): Payer: 59

## 2015-11-20 VITALS — BP 157/70 | HR 92 | Temp 98.1°F | Resp 18 | Ht 61.0 in | Wt 128.9 lb

## 2015-11-20 DIAGNOSIS — C541 Malignant neoplasm of endometrium: Secondary | ICD-10-CM | POA: Diagnosis not present

## 2015-11-20 DIAGNOSIS — Z8542 Personal history of malignant neoplasm of other parts of uterus: Secondary | ICD-10-CM

## 2015-11-20 DIAGNOSIS — K219 Gastro-esophageal reflux disease without esophagitis: Secondary | ICD-10-CM

## 2015-11-20 LAB — CBC WITH DIFFERENTIAL/PLATELET
BASO%: 0.7 % (ref 0.0–2.0)
BASOS ABS: 0 10*3/uL (ref 0.0–0.1)
EOS%: 1.9 % (ref 0.0–7.0)
Eosinophils Absolute: 0.1 10*3/uL (ref 0.0–0.5)
HCT: 29.1 % — ABNORMAL LOW (ref 34.8–46.6)
HEMOGLOBIN: 9.6 g/dL — AB (ref 11.6–15.9)
LYMPH%: 29.3 % (ref 14.0–49.7)
MCH: 30.1 pg (ref 25.1–34.0)
MCHC: 33 g/dL (ref 31.5–36.0)
MCV: 91.2 fL (ref 79.5–101.0)
MONO#: 0.6 10*3/uL (ref 0.1–0.9)
MONO%: 10 % (ref 0.0–14.0)
NEUT#: 3.8 10*3/uL (ref 1.5–6.5)
NEUT%: 58.1 % (ref 38.4–76.8)
Platelets: 165 10*3/uL (ref 145–400)
RBC: 3.19 10*6/uL — ABNORMAL LOW (ref 3.70–5.45)
RDW: 13.2 % (ref 11.2–14.5)
WBC: 6.5 10*3/uL (ref 3.9–10.3)
lymph#: 1.9 10*3/uL (ref 0.9–3.3)

## 2015-11-20 LAB — COMPREHENSIVE METABOLIC PANEL
ALT: 17 U/L (ref 0–55)
AST: 16 U/L (ref 5–34)
Albumin: 3.8 g/dL (ref 3.5–5.0)
Alkaline Phosphatase: 64 U/L (ref 40–150)
Anion Gap: 10 mEq/L (ref 3–11)
BUN: 12.1 mg/dL (ref 7.0–26.0)
CHLORIDE: 104 meq/L (ref 98–109)
CO2: 24 meq/L (ref 22–29)
Calcium: 9.1 mg/dL (ref 8.4–10.4)
Creatinine: 0.9 mg/dL (ref 0.6–1.1)
EGFR: 68 mL/min/{1.73_m2} — ABNORMAL LOW (ref >90)
GLUCOSE: 211 mg/dL — AB (ref 70–140)
POTASSIUM: 4.5 meq/L (ref 3.5–5.1)
SODIUM: 137 meq/L (ref 136–145)
Total Bilirubin: 0.39 mg/dL (ref 0.20–1.20)
Total Protein: 6.8 g/dL (ref 6.4–8.3)

## 2015-11-20 MED ORDER — PANTOPRAZOLE SODIUM 40 MG PO TBEC: 40.0000 mg | DELAYED_RELEASE_TABLET | Freq: Every day | ORAL | Status: DC

## 2015-11-20 NOTE — Telephone Encounter (Signed)
Patient saw dr Denman George today and needs a 3 month follow up with dr Lesle Chris and avs printed

## 2015-11-20 NOTE — Progress Notes (Signed)
Gyn Onc Followup Visit  Assessment:    64 y.o. 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   S/p robotic hysterectomy, BSO and resection of sigmoid nodule on 01/19/15. + LVSI, 60% myometrial invasion, lymph nodes not assessed.   She is s/p adjuvant therapy with 6 cycles carboplatin and paclitaxel and vaginal brachytherapy completed on 09/12/15 and has had a complete response.  Plan: 1)Followup in 3 months with Dr Marko Plume and with me in 6 months 2) Follow CA 125 as part of surveillance today's pending 3) counseled regarding symptoms of recurrence and to notify me of this is they occur 4) Prescribed protonix for GERD  HPI:  Brittany Schwartz is a 64 y.o. year old referred by Dr [name], initially seen in consultation on 01/09/15 for serous endometrial cancer.  She then underwent a robotic hysterectomy, BSO, resection of sigmoid colon nodule on [id number] without complications.  Her postoperative course was uncomplicated.  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 There was no cervical stromal involvement. There was involvement of the anterior uterine peritoneal reflection and left fallopian tube. Intraoperatively, Dr [name] visualized millial disease/tumor studding of the bladder peritoneum and pelvic peritoneum, but no upper abdominal visible disease. The omentum appeared normal.  She went on to receive adjuvant chemotherapy with 6 cycles of paclitaxel and carboplatin (from 02/16/15 until 06/16/15). She experienced issues with neutropenia requiring BM support stimulants.  She received vaginal brachytherapy completed 09/12/15  CT abdo/pelvis 07/07/15 (after completing adjuvant chemotherapy) showed no evidence of measurable disease.  Interval Hx: the patient has no symptoms concerns. She has had no issues since completing  radiation.  Past Medical History  Diagnosis Date  . Hypertension   . Glaucoma   . Uterine cancer ([place])   . Diabetes mellitus without complication ([place])   . Palpitations   . Muscle cramps   . Postmenopausal vaginal bleeding     Past Surgical History  Procedure Laterality Date  . Robotic assisted total hysterectomy with bilateral salpingo oopherectomy Bilateral 01/19/2015    Procedure: XI ROBOTIC ASSISTED TOTAL HYSTERECTOMY WITH BILATERAL SALPINGO OOPHORECTOMY;  Surgeon: [name], MD;  Location: [facility];  Service: Gynecology;  Laterality: Bilateral;    Family History  Problem Relation Age of Onset  . Diabetes Father     Social History   Social History  . Marital Status: Married    Spouse Name: N/A  . Number of Children: 2  . Years of Education: N/A   Occupational History  . Not on file.   Social History Main Topics  . Smoking status: Never Smoker   . Smokeless tobacco: Not on file  . Alcohol Use: No  . Drug Use: No  . Sexual Activity: Yes   Other Topics Concern  . Not on file   Social History Narrative    Current Outpatient Prescriptions on File Prior to Visit  Medication Sig Dispense Refill  . cholecalciferol (VITAMIN D) 400 UNITS TABS tablet Take 800 Units by mouth daily.    . ferrous fumarate (HEMOCYTE - 106 MG FE) 325 (106 FE) MG TABS tablet Take 1 tablet daily  on an empty stomach with OJ or vitamin C tablet 30 each 3  . glipiZIDE (GLUCOTROL XL) 5 MG 24 hr tablet Take 5 mg by mouth 2 (two) times daily.    Marland Kitchen latanoprost (XALATAN) 0.005 % ophthalmic solution Place 1 drop into  both eyes at bedtime.    Marland Kitchen lisinopril (PRINIVIL,ZESTRIL) 10 MG tablet Take 10 mg by mouth at bedtime.    . metFORMIN (GLUCOPHAGE) 500 MG tablet Take 500 mg by mouth 2 (two) times daily with a meal.    . Multiple Vitamin (MULTIVITAMIN WITH MINERALS) TABS tablet Take 1 tablet by mouth daily.    . pravastatin (PRAVACHOL) 20 MG tablet Take 20 mg by mouth daily.    . timolol (BETIMOL)  0.5 % ophthalmic solution Place 1 drop into both eyes 2 (two) times daily.    Marland Kitchen acetaminophen (TYLENOL) 325 MG tablet Take 650 mg by mouth every 6 (six) hours as needed. Reported on 11/20/2015    . atenolol-chlorthalidone (TENORETIC) 50-25 MG per tablet Take 1 tablet by mouth every morning. Reported on 11/20/2015    . gabapentin (NEURONTIN) 100 MG capsule Take 1 capsule (100 mg total) by mouth at bedtime. Take 1 at bedtime for neuropathy 30 capsule 1  . ibuprofen (ADVIL,MOTRIN) 200 MG tablet Take 200 mg by mouth every 6 (six) hours as needed (as needed for bady aches after treatment). Reported on 11/20/2015    . LORazepam (ATIVAN) 0.5 MG tablet Place 1 tablet under tongue or swallow every 6 hours as needed for nausea. Will make drowsy. (Patient not taking: Reported on 11/20/2015) 20 tablet 0  . ondansetron (ZOFRAN) 8 MG tablet Take 1 tablet by mouth every 8 hours as needed for nausea. Will not make drowsy. (Patient not taking: Reported on 06/15/2015) 30 tablet 1  . pantoprazole (PROTONIX) 40 MG tablet TAKE ONE TABLET BY MOUTH ONCE DAILY (Patient not taking: Reported on 11/20/2015) 30 tablet 2   No current facility-administered medications on file prior to visit.    No Known Allergies   Review of systems: Constitutional:  She has no weight gain or weight loss. She has no fever or chills. Eyes: No blurred vision Ears, Nose, Mouth, Throat: No dizziness, headaches or changes in hearing. No mouth sores. Cardiovascular: No chest pain, palpitations or edema. Respiratory:  No shortness of breath, wheezing or cough Gastrointestinal: She has normal bowel movements without diarrhea or constipation. She denies any nausea or vomiting. She denies blood in her stool or heart burn. Genitourinary:  She denies pelvic pain, pelvic pressure or changes in her urinary function. She has no hematuria, dysuria, or incontinence. She has no irregular vaginal bleeding or vaginal discharge Musculoskeletal: Denies muscle  weakness or joint pains.  Skin:  She has no skin changes, rashes or itching Neurological:  Denies dizziness or headaches. + neuropathy in hands and feet Psychiatric:  She denies depression or anxiety. Hematologic/Lymphatic:   No easy bruising or bleeding   Physical Exam: Blood pressure 157/70, pulse 92, temperature 98.1 F (36.7 C), temperature source Oral, resp. rate 18, height 5\' 1"  (1.549 m), weight 128 lb 14.4 oz (58.469 kg), SpO2 100 %. General: Well dressed, well nourished in no apparent distress.   HEENT:  Normocephalic and atraumatic, no lesions.  Extraocular muscles intact. Sclerae anicteric. Pupils equal, round, reactive. No mouth sores or ulcers. Thyroid is normal size, not nodular, midline. Skin:  No lesions or rashes. Breasts: deferred Lungs:  CTAB Cardiovascular: deferred Abdomen:  Soft, nontender, nondistended.  No palpable masses.  No hepatosplenomegaly.  No ascites. Normal bowel sounds.  No hernias.  Incisions are well healed. Genitourinary: Normal EGBUS  Vaginal cuff intact.  No bleeding or discharge.  No cul de sac fullness. Extremities: No cyanosis, clubbing or edema.  No calf tenderness or erythema.  No palpable cords. Psychiatric: Mood and affect are appropriate. Neurological: Awake, alert and oriented x 3. Sensation is intact, no neuropathy.  Musculoskeletal: No pain, normal strength and range of motion.  Donaciano Eva, MD

## 2015-11-20 NOTE — Patient Instructions (Signed)
Plan to follow up with Dr. Marko Plume in three months and Dr. Denman George in six months or sooner if needed.

## 2015-11-21 LAB — CA 125: CA 125: 13 U/mL (ref <35)

## 2015-11-23 ENCOUNTER — Telehealth: Payer: Self-pay | Admitting: *Deleted

## 2015-11-23 NOTE — Telephone Encounter (Addendum)
-----   Message from Baruch Merl, RN sent at 11/23/2015  4:52 PM EST -----   ----- Message -----    From: [name], MD    Sent: 11/21/2015   8:28 PM      To: Baruch Merl, RN, [place] seen and need follow up: please let her know ca 125 marker is in good low range at 13. Keep appointments as scheduled

## 2015-11-23 NOTE — Telephone Encounter (Signed)
Called pt with the results of CA 125 marker. Pt verbalized understanding. I also reminded her to keep upcoming appts as well. No further concerns.

## 2015-12-26 ENCOUNTER — Telehealth: Payer: Self-pay

## 2015-12-26 NOTE — Telephone Encounter (Signed)
Patient's call returned to daughter [name] , [place]: [phone] , with Dr Terrence Dupont [name]'s recommendations : CT scan with MD follow up appointment after CT is obtained for "stomach " bloating with pain in the "pelvis". Patient agreed to plan , will schedule CT and MD follow up and contact the patient's daughter Sonal .

## 2015-12-27 ENCOUNTER — Other Ambulatory Visit: Payer: Self-pay | Admitting: Gynecologic Oncology

## 2015-12-27 DIAGNOSIS — R140 Abdominal distension (gaseous): Secondary | ICD-10-CM

## 2015-12-27 DIAGNOSIS — C541 Malignant neoplasm of endometrium: Secondary | ICD-10-CM

## 2015-12-27 NOTE — Progress Notes (Signed)
CT scan ordered per Dr. Denman George due to new symptoms of abdominal bloating/discomfort with history of endo ca s/p surgery and chemo.  Evaluate for recurrent disease.

## 2015-12-29 ENCOUNTER — Encounter (HOSPITAL_COMMUNITY): Payer: Self-pay | Admitting: Emergency Medicine

## 2015-12-29 ENCOUNTER — Telehealth: Payer: Self-pay

## 2015-12-29 ENCOUNTER — Inpatient Hospital Stay (HOSPITAL_COMMUNITY)
Admission: EM | Admit: 2015-12-29 | Discharge: 2016-01-04 | DRG: 375 | Disposition: A | Discharge status: Home / Self Care | Payer: BLUE CROSS/BLUE SHIELD | Attending: Internal Medicine | Admitting: Internal Medicine

## 2015-12-29 ENCOUNTER — Emergency Department (HOSPITAL_COMMUNITY): Payer: BLUE CROSS/BLUE SHIELD

## 2015-12-29 DIAGNOSIS — Z90710 Acquired absence of both cervix and uterus: Secondary | ICD-10-CM

## 2015-12-29 DIAGNOSIS — Z923 Personal history of irradiation: Secondary | ICD-10-CM

## 2015-12-29 DIAGNOSIS — C801 Malignant (primary) neoplasm, unspecified: Secondary | ICD-10-CM | POA: Diagnosis not present

## 2015-12-29 DIAGNOSIS — Z9221 Personal history of antineoplastic chemotherapy: Secondary | ICD-10-CM | POA: Diagnosis not present

## 2015-12-29 DIAGNOSIS — R188 Other ascites: Secondary | ICD-10-CM | POA: Diagnosis not present

## 2015-12-29 DIAGNOSIS — N289 Disorder of kidney and ureter, unspecified: Secondary | ICD-10-CM | POA: Diagnosis not present

## 2015-12-29 DIAGNOSIS — K219 Gastro-esophageal reflux disease without esophagitis: Secondary | ICD-10-CM | POA: Diagnosis present

## 2015-12-29 DIAGNOSIS — E860 Dehydration: Secondary | ICD-10-CM | POA: Diagnosis present

## 2015-12-29 DIAGNOSIS — E872 Acidosis: Secondary | ICD-10-CM | POA: Diagnosis not present

## 2015-12-29 DIAGNOSIS — G622 Polyneuropathy due to other toxic agents: Secondary | ICD-10-CM | POA: Diagnosis not present

## 2015-12-29 DIAGNOSIS — E878 Other disorders of electrolyte and fluid balance, not elsewhere classified: Secondary | ICD-10-CM | POA: Diagnosis present

## 2015-12-29 DIAGNOSIS — Z8542 Personal history of malignant neoplasm of other parts of uterus: Secondary | ICD-10-CM | POA: Diagnosis not present

## 2015-12-29 DIAGNOSIS — R140 Abdominal distension (gaseous): Secondary | ICD-10-CM | POA: Diagnosis present

## 2015-12-29 DIAGNOSIS — Z833 Family history of diabetes mellitus: Secondary | ICD-10-CM

## 2015-12-29 DIAGNOSIS — I10 Essential (primary) hypertension: Secondary | ICD-10-CM | POA: Diagnosis present

## 2015-12-29 DIAGNOSIS — H409 Unspecified glaucoma: Secondary | ICD-10-CM | POA: Diagnosis present

## 2015-12-29 DIAGNOSIS — C799 Secondary malignant neoplasm of unspecified site: Secondary | ICD-10-CM

## 2015-12-29 DIAGNOSIS — E118 Type 2 diabetes mellitus with unspecified complications: Secondary | ICD-10-CM | POA: Diagnosis not present

## 2015-12-29 DIAGNOSIS — E119 Type 2 diabetes mellitus without complications: Secondary | ICD-10-CM | POA: Insufficient documentation

## 2015-12-29 DIAGNOSIS — R2240 Localized swelling, mass and lump, unspecified lower limb: Secondary | ICD-10-CM | POA: Diagnosis not present

## 2015-12-29 DIAGNOSIS — Z09 Encounter for follow-up examination after completed treatment for conditions other than malignant neoplasm: Secondary | ICD-10-CM | POA: Diagnosis not present

## 2015-12-29 DIAGNOSIS — Z794 Long term (current) use of insulin: Secondary | ICD-10-CM | POA: Diagnosis not present

## 2015-12-29 DIAGNOSIS — I878 Other specified disorders of veins: Secondary | ICD-10-CM

## 2015-12-29 DIAGNOSIS — C541 Malignant neoplasm of endometrium: Secondary | ICD-10-CM | POA: Diagnosis present

## 2015-12-29 DIAGNOSIS — R180 Malignant ascites: Secondary | ICD-10-CM | POA: Insufficient documentation

## 2015-12-29 DIAGNOSIS — N135 Crossing vessel and stricture of ureter without hydronephrosis: Secondary | ICD-10-CM | POA: Diagnosis present

## 2015-12-29 DIAGNOSIS — C786 Secondary malignant neoplasm of retroperitoneum and peritoneum: Principal | ICD-10-CM | POA: Diagnosis present

## 2015-12-29 DIAGNOSIS — Z79899 Other long term (current) drug therapy: Secondary | ICD-10-CM | POA: Diagnosis not present

## 2015-12-29 DIAGNOSIS — J910 Malignant pleural effusion: Secondary | ICD-10-CM | POA: Diagnosis not present

## 2015-12-29 DIAGNOSIS — N1330 Unspecified hydronephrosis: Secondary | ICD-10-CM | POA: Diagnosis present

## 2015-12-29 DIAGNOSIS — N179 Acute kidney failure, unspecified: Secondary | ICD-10-CM | POA: Diagnosis present

## 2015-12-29 DIAGNOSIS — E871 Hypo-osmolality and hyponatremia: Secondary | ICD-10-CM | POA: Diagnosis present

## 2015-12-29 DIAGNOSIS — R339 Retention of urine, unspecified: Secondary | ICD-10-CM | POA: Diagnosis present

## 2015-12-29 DIAGNOSIS — D500 Iron deficiency anemia secondary to blood loss (chronic): Secondary | ICD-10-CM

## 2015-12-29 DIAGNOSIS — D630 Anemia in neoplastic disease: Secondary | ICD-10-CM | POA: Diagnosis present

## 2015-12-29 LAB — URINALYSIS, ROUTINE W REFLEX MICROSCOPIC
Bilirubin Urine: NEGATIVE
Glucose, UA: NEGATIVE mg/dL
Hgb urine dipstick: NEGATIVE
Ketones, ur: NEGATIVE mg/dL
LEUKOCYTES UA: NEGATIVE
NITRITE: NEGATIVE
PROTEIN: 30 mg/dL — AB
SPECIFIC GRAVITY, URINE: 1.022 (ref 1.005–1.030)
pH: 5.5 (ref 5.0–8.0)

## 2015-12-29 LAB — COMPREHENSIVE METABOLIC PANEL
ALBUMIN: 3.8 g/dL (ref 3.5–5.0)
ALK PHOS: 44 U/L (ref 38–126)
ALT: 14 U/L (ref 14–54)
ANION GAP: 11 (ref 5–15)
AST: 21 U/L (ref 15–41)
BILIRUBIN TOTAL: 0.5 mg/dL (ref 0.3–1.2)
BUN: 16 mg/dL (ref 6–20)
CALCIUM: 9 mg/dL (ref 8.9–10.3)
CO2: 21 mmol/L — ABNORMAL LOW (ref 22–32)
Chloride: 86 mmol/L — ABNORMAL LOW (ref 101–111)
Creatinine, Ser: 1.35 mg/dL — ABNORMAL HIGH (ref 0.44–1.00)
GFR calc Af Amer: 47 mL/min — ABNORMAL LOW (ref >60)
GFR, EST NON AFRICAN AMERICAN: 40 mL/min — AB (ref >60)
GLUCOSE: 178 mg/dL — AB (ref 65–99)
POTASSIUM: 4.9 mmol/L (ref 3.5–5.1)
Sodium: 118 mmol/L — CL (ref 135–145)
TOTAL PROTEIN: 6.9 g/dL (ref 6.5–8.1)

## 2015-12-29 LAB — CBC
HEMATOCRIT: 28.9 % — AB (ref 36.0–46.0)
Hemoglobin: 10.2 g/dL — ABNORMAL LOW (ref 12.0–15.0)
MCH: 29.8 pg (ref 26.0–34.0)
MCHC: 35.3 g/dL (ref 30.0–36.0)
MCV: 84.5 fL (ref 78.0–100.0)
Platelets: 330 10*3/uL (ref 150–400)
RBC: 3.42 MIL/uL — ABNORMAL LOW (ref 3.87–5.11)
RDW: 12.2 % (ref 11.5–15.5)
WBC: 10.5 10*3/uL (ref 4.0–10.5)

## 2015-12-29 LAB — URINE MICROSCOPIC-ADD ON

## 2015-12-29 LAB — OSMOLALITY, URINE: OSMOLALITY UR: 518 mosm/kg (ref 300–900)

## 2015-12-29 LAB — LIPASE, BLOOD: Lipase: 36 U/L (ref 11–51)

## 2015-12-29 LAB — SODIUM, URINE, RANDOM: Sodium, Ur: 40 mmol/L

## 2015-12-29 MED ORDER — ONDANSETRON HCL 4 MG PO TABS: 4.0000 mg | ORAL_TABLET | Freq: Four times a day (QID) | ORAL | Status: DC | PRN

## 2015-12-29 MED ORDER — ENOXAPARIN SODIUM 30 MG/0.3ML ~~LOC~~ SOLN
30.0000 mg | SUBCUTANEOUS | Status: DC
  Administered 2015-12-29 – 2015-12-30 (×2): 30 mg via SUBCUTANEOUS
  Filled 2015-12-29 (×3): qty 0.3

## 2015-12-29 MED ORDER — IOHEXOL 300 MG/ML  SOLN
100.0000 mL | Freq: Once | INTRAMUSCULAR | Status: AC | PRN
  Administered 2015-12-29: 80 mL via INTRAVENOUS

## 2015-12-29 MED ORDER — PANTOPRAZOLE SODIUM 40 MG PO TBEC
40.0000 mg | DELAYED_RELEASE_TABLET | Freq: Every day | ORAL | Status: DC
  Administered 2015-12-30 – 2016-01-04 (×6): 40 mg via ORAL
  Filled 2015-12-29 (×6): qty 1

## 2015-12-29 MED ORDER — LATANOPROST 0.005 % OP SOLN
1.0000 [drp] | Freq: Every day | OPHTHALMIC | Status: DC
  Administered 2015-12-29 – 2016-01-03 (×6): 1 [drp] via OPHTHALMIC
  Filled 2015-12-29: qty 2.5

## 2015-12-29 MED ORDER — ACETAMINOPHEN 650 MG RE SUPP: 650.0000 mg | Freq: Four times a day (QID) | RECTAL | Status: DC | PRN

## 2015-12-29 MED ORDER — HYDROMORPHONE HCL 1 MG/ML IJ SOLN
0.5000 mg | INTRAMUSCULAR | Status: DC | PRN
  Administered 2015-12-29 – 2016-01-01 (×7): 0.5 mg via INTRAVENOUS
  Filled 2015-12-29 (×8): qty 1

## 2015-12-29 MED ORDER — IOHEXOL 300 MG/ML  SOLN
50.0000 mL | Freq: Once | INTRAMUSCULAR | Status: DC | PRN
  Administered 2015-12-29: 50 mL via ORAL
  Filled 2015-12-29: qty 50

## 2015-12-29 MED ORDER — SODIUM CHLORIDE 0.9 % IV SOLN
INTRAVENOUS | Status: DC
  Administered 2015-12-29 – 2015-12-30 (×2): via INTRAVENOUS
  Administered 2015-12-30 – 2015-12-31 (×2): 1000 mL via INTRAVENOUS
  Administered 2016-01-01: 07:00:00 via INTRAVENOUS

## 2015-12-29 MED ORDER — ONDANSETRON HCL 4 MG/2ML IJ SOLN
4.0000 mg | Freq: Four times a day (QID) | INTRAMUSCULAR | Status: DC | PRN
  Administered 2015-12-30: 4 mg via INTRAVENOUS
  Filled 2015-12-29 (×2): qty 2

## 2015-12-29 MED ORDER — ACETAMINOPHEN 325 MG PO TABS
650.0000 mg | ORAL_TABLET | Freq: Four times a day (QID) | ORAL | Status: DC | PRN
  Administered 2016-01-03: 650 mg via ORAL
  Filled 2015-12-29: qty 2

## 2015-12-29 MED ORDER — PRAVASTATIN SODIUM 20 MG PO TABS
20.0000 mg | ORAL_TABLET | Freq: Every day | ORAL | Status: DC
  Administered 2015-12-29 – 2016-01-04 (×7): 20 mg via ORAL
  Filled 2015-12-29 (×8): qty 1

## 2015-12-29 MED ORDER — ACETAMINOPHEN 325 MG PO TABS: 650.0000 mg | ORAL_TABLET | Freq: Four times a day (QID) | ORAL | Status: DC | PRN

## 2015-12-29 NOTE — Telephone Encounter (Signed)
Orders received form Joylene John , APNP to contact the patient's daughter [name] at [phone] . Spoke with Sonal , she states that her mother's pain is "worse" ,however her mother did see her PCP and it was recommended to go on a "liquid " diet . Sonal states that her mother has a "high" pain threshold , but this is worrisome , Sonal instructed to take her mother to the ED to evaluated . Sonal also instructed to keep CT appointment schedule for Monday 01/01/2016 if CT is not obtain with ED visit today. Sonal states understanding , denies further questions at this time , does plan to take her mother to the ED today , will update Melissa , NP.

## 2015-12-29 NOTE — H&P (Addendum)
[facility] History and Physical  Brittany Schwartz [id number] DOB: [birth year] DOA: 12/29/2015  Referring physician: EDP PCP: [name], MD   Chief Complaint: abd pain, distension  HPI: Brittany Schwartz is a 65 y.o. female with past medical history of stage IIIa endometrial cancer, underwent robotic hysterectomy in 2/16, followed by chemotherapy and most recently vaginal brachy therapy. Radiation was completed in October 2 016, it was felt that she had a complete response. Today presents to the ER with the above complaints. Patient reports mild lower abdominal/pelvic discomfort for the last 2 weeks, she had some brief trouble urinating at the time and was given antibiotics. She felt better and then on Friday noticed increased abdominal distention and pain which is primarily in the lower pelvis. She saw her medical doctor, had an x-ray done and was put on a liquid diet for 2 days. Due to persistence of symptoms came to the ER today, labs remarkable for sodium of 118, creatinine 1.35 CT abdomen pelvis unfortunately showing extensive peritoneal carcinomatosis, ascites, Extensive omental caking and masses. Mass in the left pelvis causing left ureteral obstruction and moderate hydronephrosis. [facility] was consulted for further evaluation and management  Review of Systems: positives bolded Constitutional:  No weight loss, night sweats, Fevers, chills, fatigue.  HEENT:  No headaches, Difficulty swallowing,Tooth/dental problems,Sore throat,  No sneezing, itching, ear ache, nasal congestion, post nasal drip,  Cardio-vascular: G the  No chest pain, Orthopnea, PND, swelling in lower extremities, anasarca, dizziness, palpitations  GI:  No heartburn, indigestion, abdominal pain, nausea, vomiting, diarrhea, change in bowel habits, loss of appetite  Resp:  No shortness of breath with exertion or at rest. No excess mucus, no productive cough, No non-productive cough, No coughing up of blood.No change  in color of mucus.No wheezing.No chest wall deformity  Skin:  no rash or lesions.  GU:  no dysuria, change in color of urine, no urgency or frequency. No flank pain.  Musculoskeletal:  No joint pain or swelling. No decreased range of motion. No back pain.  Psych:  No change in mood or affect. No depression or anxiety. No memory loss.   Past Medical History  Diagnosis Date  . Hypertension   . Glaucoma   . Diabetes mellitus without complication ([place])   . Palpitations   . Muscle cramps   . Postmenopausal vaginal bleeding   . Uterine cancer [facility])    Past Surgical History  Procedure Laterality Date  . Robotic assisted total hysterectomy with bilateral salpingo oopherectomy Bilateral 01/19/2015    Procedure: XI ROBOTIC ASSISTED TOTAL HYSTERECTOMY WITH BILATERAL SALPINGO OOPHORECTOMY;  Surgeon: [name], MD;  Location: [facility];  Service: Gynecology;  Laterality: Bilateral;   Social History:  reports that she has never smoked. She has never used smokeless tobacco. She reports that she does not drink alcohol or use illicit drugs.  No Known Allergies  Family History  Problem Relation Age of Onset  . Diabetes Father     Prior to Admission medications   Medication Sig Start Date End Date Taking? Authorizing Provider  atenolol-chlorthalidone (TENORETIC) 50-25 MG per tablet Take 1 tablet by mouth every morning. Reported on 11/20/2015   Yes Historical Provider, MD  ferrous fumarate (HEMOCYTE - 106 MG FE) 325 (106 FE) MG TABS tablet Take 1 tablet daily  on an empty stomach with OJ or vitamin C tablet 09/11/15  Yes [name], MD  glipiZIDE (GLUCOTROL XL) 5 MG 24 hr tablet Take 5 mg by mouth 2 (two) times daily.  Yes Historical Provider, MD  latanoprost (XALATAN) 0.005 % ophthalmic solution Place 1 drop into both eyes at bedtime.   Yes Historical Provider, MD  lisinopril (PRINIVIL,ZESTRIL) 10 MG tablet Take 10 mg by mouth at bedtime.   Yes Historical Provider, MD  magnesium 30 MG  tablet Take 30 mg by mouth daily.   Yes Historical Provider, MD  metFORMIN (GLUCOPHAGE) 1000 MG tablet Take 1,000 mg by mouth 2 (two) times daily with a meal.   Yes Historical Provider, MD  pantoprazole (PROTONIX) 40 MG tablet Take 1 tablet (40 mg total) by mouth daily. 11/20/15  Yes Everitt Amber, MD  pravastatin (PRAVACHOL) 20 MG tablet Take 20 mg by mouth daily.   Yes Historical Provider, MD  timolol (BETIMOL) 0.5 % ophthalmic solution Place 1 drop into both eyes 2 (two) times daily.   Yes Historical Provider, MD  acetaminophen (TYLENOL) 325 MG tablet Take 650 mg by mouth every 6 (six) hours as needed. Reported on 11/20/2015    Historical Provider, MD  gabapentin (NEURONTIN) 100 MG capsule Take 1 capsule (100 mg total) by mouth at bedtime. Take 1 at bedtime for neuropathy Patient not taking: Reported on 12/29/2015 08/14/15   [name], MD  LORazepam (ATIVAN) 0.5 MG tablet Place 1 tablet under tongue or swallow every 6 hours as needed for nausea. Will make drowsy. Patient not taking: Reported on 11/20/2015 04/11/15   Lennis P Marko Plume, MD  ondansetron (ZOFRAN) 8 MG tablet Take 1 tablet by mouth every 8 hours as needed for nausea. Will not make drowsy. Patient not taking: Reported on 06/15/2015 02/09/15   [name], MD   Physical Exam: Filed Vitals:   12/29/15 1201 12/29/15 1529  BP: 134/72 177/68  Pulse: 81 85  Temp: 97.5 F (36.4 C)   TempSrc: Oral   Resp: 16 16  SpO2: 100% 100%    Wt Readings from Last 3 Encounters:  11/20/15 58.469 kg (128 lb 14.4 oz)  08/17/15 55.702 kg (122 lb 12.8 oz)  08/14/15 56.926 kg (125 lb 8 oz)    General:  Appears calm and comfortable, no distress, AAOx3 Eyes: PERRL, normal lids, irises & conjunctiva ENT: grossly normal hearing, lips & tongue Neck: no LAD, masses or thyromegaly Cardiovascular: RRR, no m/r/g. No LE edema. Telemetry: SR, no arrhythmias  Respiratory: CTA bilaterally, no w/r/r. Normal respiratory effort. Abdomen: soft,  ntnd Skin: no rash or induration seen on limited exam Musculoskeletal: grossly normal tone BUE/BLE Psychiatric: grossly normal mood, flat affect, speech fluent and appropriate Neurologic: grossly non-focal.          Labs on Admission:  Basic Metabolic Panel:  Recent Labs Lab 12/29/15 1242  NA 118*  K 4.9  CL 86*  CO2 21*  GLUCOSE 178*  BUN 16  CREATININE 1.35*  CALCIUM 9.0   Liver Function Tests:  Recent Labs Lab 12/29/15 1242  AST 21  ALT 14  ALKPHOS 44  BILITOT 0.5  PROT 6.9  ALBUMIN 3.8    Recent Labs Lab 12/29/15 1242  LIPASE 36   No results for input(s): AMMONIA in the last 168 hours. CBC:  Recent Labs Lab 12/29/15 1242  WBC 10.5  HGB 10.2*  HCT 28.9*  MCV 84.5  PLT 330   Cardiac Enzymes: No results for input(s): CKTOTAL, CKMB, CKMBINDEX, TROPONINI in the last 168 hours.  BNP (last 3 results) No results for input(s): BNP in the last 8760 hours.  ProBNP (last 3 results) No results for input(s): PROBNP in the last  8760 hours.  CBG: No results for input(s): GLUCAP in the last 168 hours.  Radiological Exams on Admission: Ct Abdomen Pelvis W Contrast  12/29/2015  CLINICAL DATA:  Bilateral upper abdominal pain for a week. History of endometrial cancer. The patient has completed chemotherapy and radiation therapy. Initial encounter. EXAM: CT ABDOMEN AND PELVIS WITH CONTRAST TECHNIQUE: Multidetector CT imaging of the abdomen and pelvis was performed using the standard protocol following bolus administration of intravenous contrast. CONTRAST:  80 mL OMNIPAQUE IOHEXOL 300 MG/ML  SOLN COMPARISON:  CT abdomen and pelvis 07/07/2015 FINDINGS: Mild dependent atelectasis is seen in the lung bases. Heart size is mildly enlarged. There is a trace right pleural effusion. No pericardial effusion or left pleural effusion. The patient has a new moderate to large volume of abdominal and pelvic ascites. Extensive omental caking is new since the prior examination and  appears worst in the right mid abdomen. More masslike omental lesion is seen at the level of the right iliac wing and measures 6.1 cm AP by 3.3 cm transverse on image 62. Pelvic mass lesions are also identified. In the left hemipelvis, a lesion measuring approximately 6.3 cm AP by 2.9 cm transverse is seen on image 78. The liver, gallbladder, spleen, adrenal glands, pancreas and right kidney appear normal. There is moderate left hydronephrosis with delayed excretion of contrast from the left kidney. 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 The patient is status post hysterectomy. No focal bony abnormality is identified. IMPRESSION: New extensive peritoneal carcinomatosis consistent with metastatic endometrial carcinoma with associated moderate to large volume of ascites, extensive omental caking and masses. Mass in the left pelvis results and obstruction of the left ureter and moderate hydronephrosis. These results were called by telephone at the time of interpretation on 12/29/2015 at 2:58 pm to Dr. Dalia Heading , who verbally acknowledged these results. Electronically Signed   By: Inge Rise M.D.   On: 12/29/2015 15:00     Assessment/Plan     Endometrial cancer  -Now with extensive new carcinomatosis consistent with metastatic disease and malignant ascites  -Have ordered ultrasound-guided paracentesis and discussed with ultrasound/Radiology for this to be done tomorrow  -Follow-up cytology   follow-up CA-125 -I have notified Dr. Marko Plume of admission she will follow-up on Monday  Left hydronephrosis  -Secondary to pelvic mass, discussed with urology Dr. Junious Silk  -Recommend continued observation/supportive care and no need for acute intervention at this time since she is not very symptomatic from this standpoint and kidney function close to normal  -She will need a follow-up ultrasound and follow-up with them as outpatient  -Monitor urine  output, IV fluids for now, hold ACE    Mild AKI -hold ACE, hydrate and monitor, doubt Hydronephrosis contributing much    Diabetes mellitus  -Hold metformin, glipizide , sliding scale insulin     Hyponatremia -Suspect secondary to diuretic (chlorthalidone) and possibly component of volume depletion and dehydration -We'll hydrate, hold chlorthalidone -Follow urine sodium and osmolarity -Baseline sodium is normal    Essential hypertension -Continue atenolol, hold chlorthalidone   Anemia -Likely secondary to chronic disease, stable  Code Status: Full Code DVT Prophylaxis: Lovenox Family Communication: daughter at bedside Disposition Plan: inpatient  Time spent: 79min  Ramell Wacha [facility] Pager [phone]

## 2015-12-29 NOTE — ED Notes (Addendum)
Pt reports bilateral upper abd pain for the past week. No n/v/d. Pt was going to have outpatient imaging on Monday, but could not wait that long. Pain decreases with belching. Abd does appear to be distended. Hx of uterine ca.

## 2015-12-29 NOTE — ED Notes (Signed)
MD at bedside. Admitting  

## 2015-12-29 NOTE — ED Notes (Signed)
Pt off the floor for CT

## 2015-12-29 NOTE — ED Notes (Signed)
Pt with hx of endometrial cancer, last radiation and chemo in October. Pain is located across the upper abdomen. Saw her PCP 2 days ago had xray but no results. Was told to use Liquid diet.

## 2015-12-29 NOTE — ED Provider Notes (Signed)
CSN: [id number]     Arrival date & time 12/29/15  1153 History   First MD Initiated Contact with Patient 12/29/15 1222     Chief Complaint  Patient presents with  . Abdominal Pain     (Consider location/radiation/quality/duration/timing/severity/associated sxs/prior Treatment) HPI Patient presents to the emergency department with abdominal distention and pain that started a couple weeks ago, but does get worse over the last week.  The patient states she is had decreased appetite due to this abdominal swelling and discomfort.  The patient states that her primary care doctor saw her early in the week and did a plain film which showed no significant abnormality.  Patient was due to have a CT scan done at the beginning of the week, but felt that the symptoms were too significant to wait.  The patient denies nausea, vomiting, weakness, dizziness, headache, blurred vision, back pain, dysuria, incontinence, bloody stool, hematemesis, rash, cough, chest pain, shortness of breath or syncope.  Patient states that she did not take any medications prior to arrival for her symptoms Past Medical History  Diagnosis Date  . Hypertension   . Glaucoma   . Diabetes mellitus without complication ([place])   . Palpitations   . Muscle cramps   . Postmenopausal vaginal bleeding   . Uterine cancer [facility]    Past Surgical History  Procedure Laterality Date  . Robotic assisted total hysterectomy with bilateral salpingo oopherectomy Bilateral 01/19/2015    Procedure: XI ROBOTIC ASSISTED TOTAL HYSTERECTOMY WITH BILATERAL SALPINGO OOPHORECTOMY;  Surgeon: [name], MD;  Location: [facility];  Service: Gynecology;  Laterality: Bilateral;   Family History  Problem Relation Age of Onset  . Diabetes Father    Social History  Substance Use Topics  . Smoking status: Never Smoker   . Smokeless tobacco: None  . Alcohol Use: No   OB History    No data available     Review of Systems  All other systems negative  except as documented in the HPI. All pertinent positives and negatives as reviewed in the HPI.  Allergies  Review of patient's allergies indicates no known allergies.  Home Medications   Prior to Admission medications   Medication Sig Start Date End Date Taking? Authorizing Provider  atenolol-chlorthalidone (TENORETIC) 50-25 MG per tablet Take 1 tablet by mouth every morning. Reported on 11/20/2015   Yes Historical Provider, MD  ferrous fumarate (HEMOCYTE - 106 MG FE) 325 (106 FE) MG TABS tablet Take 1 tablet daily  on an empty stomach with OJ or vitamin C tablet 09/11/15  Yes [name], MD  glipiZIDE (GLUCOTROL XL) 5 MG 24 hr tablet Take 5 mg by mouth 2 (two) times daily.   Yes Historical Provider, MD  latanoprost (XALATAN) 0.005 % ophthalmic solution Place 1 drop into both eyes at bedtime.   Yes Historical Provider, MD  lisinopril (PRINIVIL,ZESTRIL) 10 MG tablet Take 10 mg by mouth at bedtime.   Yes Historical Provider, MD  magnesium 30 MG tablet Take 30 mg by mouth daily.   Yes Historical Provider, MD  metFORMIN (GLUCOPHAGE) 1000 MG tablet Take 1,000 mg by mouth 2 (two) times daily with a meal.   Yes Historical Provider, MD  pantoprazole (PROTONIX) 40 MG tablet Take 1 tablet (40 mg total) by mouth daily. 11/20/15  Yes Everitt Amber, MD  pravastatin (PRAVACHOL) 20 MG tablet Take 20 mg by mouth daily.   Yes Historical Provider, MD  timolol (BETIMOL) 0.5 % ophthalmic solution Place 1 drop into both eyes  2 (two) times daily.   Yes Historical Provider, MD  acetaminophen (TYLENOL) 325 MG tablet Take 650 mg by mouth every 6 (six) hours as needed. Reported on 11/20/2015    Historical Provider, MD  gabapentin (NEURONTIN) 100 MG capsule Take 1 capsule (100 mg total) by mouth at bedtime. Take 1 at bedtime for neuropathy Patient not taking: Reported on 12/29/2015 08/14/15   [name], MD  LORazepam (ATIVAN) 0.5 MG tablet Place 1 tablet under tongue or swallow every 6 hours as needed for nausea.  Will make drowsy. Patient not taking: Reported on 11/20/2015 04/11/15   Lennis P Marko Plume, MD  ondansetron (ZOFRAN) 8 MG tablet Take 1 tablet by mouth every 8 hours as needed for nausea. Will not make drowsy. Patient not taking: Reported on 06/15/2015 02/09/15   [name], MD   BP 134/72 mmHg  Pulse 81  Temp(Src) 97.5 F (36.4 C) (Oral)  Resp 16  SpO2 100% Physical Exam  Constitutional: She is oriented to person, place, and time. She appears well-developed and well-nourished. No distress.  HENT:  Head: Normocephalic and atraumatic.  Mouth/Throat: Oropharynx is clear and moist.  Eyes: Pupils are equal, round, and reactive to light.  Neck: Normal range of motion. Neck supple.  Cardiovascular: Normal rate, regular rhythm and normal heart sounds.  Exam reveals no gallop and no friction rub.   No murmur heard. Pulmonary/Chest: Effort normal and breath sounds normal. No respiratory distress. She has no wheezes.  Abdominal: Soft. Bowel sounds are normal. She exhibits distension. There is tenderness. There is no rebound and no guarding.  Neurological: She is alert and oriented to person, place, and time. She exhibits normal muscle tone. Coordination normal.  Skin: Skin is warm and dry. No rash noted. No erythema.  Psychiatric: She has a normal mood and affect. Her behavior is normal.  Nursing note and vitals reviewed.   ED Course  Procedures (including critical care time) Labs Review Labs Reviewed  COMPREHENSIVE METABOLIC PANEL - Abnormal; Notable for the following:    Sodium 118 (*)    Chloride 86 (*)    CO2 21 (*)    Glucose, Bld 178 (*)    Creatinine, Ser 1.35 (*)    GFR calc non Af Amer 40 (*)    GFR calc Af Amer 47 (*)    All other components within normal limits  CBC - Abnormal; Notable for the following:    RBC 3.42 (*)    Hemoglobin 10.2 (*)    HCT 28.9 (*)    All other components within normal limits  URINALYSIS, ROUTINE W REFLEX MICROSCOPIC (NOT AT [facility]) - Abnormal;  Notable for the following:    APPearance CLOUDY (*)    Protein, ur 30 (*)    All other components within normal limits  URINE MICROSCOPIC-ADD ON - Abnormal; Notable for the following:    Squamous Epithelial / LPF 6-30 (*)    Bacteria, UA MANY (*)    All other components within normal limits  CA 125 - Abnormal; Notable for the following:    CA 125 326.4 (*)    All other components within normal limits  CBC - Abnormal; Notable for the following:    RBC 2.92 (*)    Hemoglobin 8.9 (*)    HCT 25.2 (*)    All other components within normal limits  COMPREHENSIVE METABOLIC PANEL - Abnormal; Notable for the following:    Sodium 118 (*)    Chloride 89 (*)  CO2 18 (*)    Glucose, Bld 135 (*)    Creatinine, Ser 1.34 (*)    Calcium 8.2 (*)    Total Protein 5.7 (*)    Albumin 3.1 (*)    ALT 12 (*)    Alkaline Phosphatase 36 (*)    GFR calc non Af Amer 41 (*)    GFR calc Af Amer 47 (*)    All other components within normal limits  LACTATE DEHYDROGENASE, BODY FLUID - Abnormal; Notable for the following:    LD, Fluid 447 (*)    All other components within normal limits  BODY FLUID CELL COUNT WITH DIFFERENTIAL - Abnormal; Notable for the following:    Color, Fluid RED (*)    Appearance, Fluid TURBID (*)    All other components within normal limits  GLUCOSE, CAPILLARY - Abnormal; Notable for the following:    Glucose-Capillary 168 (*)    All other components within normal limits  GLUCOSE, CAPILLARY - Abnormal; Notable for the following:    Glucose-Capillary 149 (*)    All other components within normal limits  GLUCOSE, CAPILLARY - Abnormal; Notable for the following:    Glucose-Capillary 159 (*)    All other components within normal limits  LIPASE, BLOOD  SODIUM, URINE, RANDOM  OSMOLALITY, URINE  PROTEIN, BODY FLUID  GLUCOSE, SEROUS FLUID  CBC  BASIC METABOLIC PANEL  TSH  HEMOGLOBIN A1C  CYTOLOGY - NON PAP    Imaging Review Ct Abdomen Pelvis W Contrast  12/29/2015  CLINICAL  DATA:  Bilateral upper abdominal pain for a week. History of endometrial cancer. The patient has completed chemotherapy and radiation therapy. Initial encounter. EXAM: CT ABDOMEN AND PELVIS WITH CONTRAST TECHNIQUE: Multidetector CT imaging of the abdomen and pelvis was performed using the standard protocol following bolus administration of intravenous contrast. CONTRAST:  80 mL OMNIPAQUE IOHEXOL 300 MG/ML  SOLN COMPARISON:  CT abdomen and pelvis 07/07/2015 FINDINGS: Mild dependent atelectasis is seen in the lung bases. Heart size is mildly enlarged. There is a trace right pleural effusion. No pericardial effusion or left pleural effusion. The patient has a new moderate to large volume of abdominal and pelvic ascites. Extensive omental caking is new since the prior examination and appears worst in the right mid abdomen. More masslike omental lesion is seen at the level of the right iliac wing and measures 6.1 cm AP by 3.3 cm transverse on image 62. Pelvic mass lesions are also identified. In the left hemipelvis, a lesion measuring approximately 6.3 cm AP by 2.9 cm transverse is seen on image 78. The liver, gallbladder, spleen, adrenal glands, pancreas and right kidney appear normal. There is moderate left hydronephrosis with delayed excretion of contrast from the left kidney. 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 The patient is status post hysterectomy. No focal bony abnormality is identified. IMPRESSION: New extensive peritoneal carcinomatosis consistent with metastatic endometrial carcinoma with associated moderate to large volume of ascites, extensive omental caking and masses. Mass in the left pelvis results and obstruction of the left ureter and moderate hydronephrosis. These results were called by telephone at the time of interpretation on 12/29/2015 at 2:58 pm to Dr. Dalia Heading , who verbally acknowledged these results. Electronically Signed   By: Inge Rise M.D.   On: 12/29/2015 15:00   I have personally reviewed and evaluated these images and lab results as part of my medical decision-making.   EKG Interpretation None  The patient has metastatic disease in the abdomen without large mass in the pelvic region that is obstructing her left ureter.  The patient has had a large amount of ascites noted on the CT scan.  I spoke with the radiologist who called me with the results.  I also spoke with the patient and her family and explained the results of the CT scan.  The patient will be admitted to the hospital for further evaluation and care   Dalia Heading, PA-C 12/31/15 0103  Julianne Rice, MD 01/03/16 1212

## 2015-12-30 ENCOUNTER — Inpatient Hospital Stay (HOSPITAL_COMMUNITY): Payer: BLUE CROSS/BLUE SHIELD

## 2015-12-30 DIAGNOSIS — C801 Malignant (primary) neoplasm, unspecified: Secondary | ICD-10-CM

## 2015-12-30 DIAGNOSIS — E119 Type 2 diabetes mellitus without complications: Secondary | ICD-10-CM | POA: Insufficient documentation

## 2015-12-30 LAB — CBC
HEMATOCRIT: 25.2 % — AB (ref 36.0–46.0)
Hemoglobin: 8.9 g/dL — ABNORMAL LOW (ref 12.0–15.0)
MCH: 30.5 pg (ref 26.0–34.0)
MCHC: 35.3 g/dL (ref 30.0–36.0)
MCV: 86.3 fL (ref 78.0–100.0)
PLATELETS: 292 10*3/uL (ref 150–400)
RBC: 2.92 MIL/uL — ABNORMAL LOW (ref 3.87–5.11)
RDW: 12.2 % (ref 11.5–15.5)
WBC: 9.7 10*3/uL (ref 4.0–10.5)

## 2015-12-30 LAB — BODY FLUID CELL COUNT WITH DIFFERENTIAL
EOS FL: 0 %
Lymphs, Fluid: 24 %
MONOCYTE-MACROPHAGE-SEROUS FLUID: 63 % (ref 50–90)
Neutrophil Count, Fluid: 13 % (ref 0–25)
Total Nucleated Cell Count, Fluid: 799 cu mm (ref 0–1000)

## 2015-12-30 LAB — LACTATE DEHYDROGENASE, PLEURAL OR PERITONEAL FLUID: LD FL: 447 U/L — AB (ref 3–23)

## 2015-12-30 LAB — COMPREHENSIVE METABOLIC PANEL
ALBUMIN: 3.1 g/dL — AB (ref 3.5–5.0)
ALT: 12 U/L — ABNORMAL LOW (ref 14–54)
ANION GAP: 11 (ref 5–15)
AST: 18 U/L (ref 15–41)
Alkaline Phosphatase: 36 U/L — ABNORMAL LOW (ref 38–126)
BILIRUBIN TOTAL: 0.6 mg/dL (ref 0.3–1.2)
BUN: 17 mg/dL (ref 6–20)
CHLORIDE: 89 mmol/L — AB (ref 101–111)
CO2: 18 mmol/L — ABNORMAL LOW (ref 22–32)
Calcium: 8.2 mg/dL — ABNORMAL LOW (ref 8.9–10.3)
Creatinine, Ser: 1.34 mg/dL — ABNORMAL HIGH (ref 0.44–1.00)
GFR calc Af Amer: 47 mL/min — ABNORMAL LOW (ref >60)
GFR calc non Af Amer: 41 mL/min — ABNORMAL LOW (ref >60)
GLUCOSE: 135 mg/dL — AB (ref 65–99)
POTASSIUM: 5 mmol/L (ref 3.5–5.1)
Sodium: 118 mmol/L — CL (ref 135–145)
TOTAL PROTEIN: 5.7 g/dL — AB (ref 6.5–8.1)

## 2015-12-30 LAB — PROTEIN, BODY FLUID: TOTAL PROTEIN, FLUID: 3.5 g/dL

## 2015-12-30 LAB — GLUCOSE, CAPILLARY
Glucose-Capillary: 168 mg/dL — ABNORMAL HIGH (ref 65–99)
Glucose-Capillary: 149 mg/dL — ABNORMAL HIGH (ref 65–99)
Glucose-Capillary: 159 mg/dL — ABNORMAL HIGH (ref 65–99)

## 2015-12-30 LAB — GLUCOSE, SEROUS FLUID: GLUCOSE FL: 154 mg/dL

## 2015-12-30 LAB — CA 125: CA 125: 326.4 U/mL — AB (ref 0.0–38.1)

## 2015-12-30 MED ORDER — SODIUM CHLORIDE 1 G PO TABS
1.0000 g | ORAL_TABLET | Freq: Three times a day (TID) | ORAL | Status: DC
  Administered 2015-12-30: 1 g via ORAL
  Filled 2015-12-30 (×5): qty 1

## 2015-12-30 MED ORDER — INSULIN ASPART 100 UNIT/ML ~~LOC~~ SOLN
0.0000 [IU] | Freq: Three times a day (TID) | SUBCUTANEOUS | Status: DC
  Administered 2015-12-30 – 2015-12-31 (×2): 2 [IU] via SUBCUTANEOUS
  Administered 2015-12-31: 5 [IU] via SUBCUTANEOUS
  Administered 2015-12-31 – 2016-01-01 (×4): 3 [IU] via SUBCUTANEOUS
  Administered 2016-01-02: 8 [IU] via SUBCUTANEOUS
  Administered 2016-01-02: 2 [IU] via SUBCUTANEOUS
  Administered 2016-01-03: 8 [IU] via SUBCUTANEOUS
  Administered 2016-01-03: 2 [IU] via SUBCUTANEOUS
  Administered 2016-01-03 – 2016-01-04 (×2): 3 [IU] via SUBCUTANEOUS

## 2015-12-30 NOTE — Progress Notes (Signed)
PROGRESS NOTE  Brittany Schwartz [id number] DOB: 1951/06/20 DOA: 12/29/2015 PCP: [name], MD  HPI/Recap of past 24 hours:   returned from paracentesis, reported feeling better, denies ab pain, moving bowel and urinate well, denies confusion, family in room  Assessment/Plan: Active Problems:   Endometrial cancer ([place])   Diabetes mellitus ([place])   Hyponatremia   Type 2 diabetes mellitus without complication ([place])   Essential hypertension   Metastatic cancer ([place])   Ascites   Hydronephrosis, left   Abdominal distension  Endometrial cancer , metastatic  -Now with extensive new carcinomatosis consistent with metastatic disease and malignant ascites , rising CA125. -s/p ultrasound-guided paracentesis with 4liter fluids removed, cell count no SBP, cytology pending  -oncology  Dr. Marko Plume notified of admission, she will follow-up on Monday  Left hydronephrosis  -Secondary to pelvic mass, discussed with urology Dr. Junious Silk  -Recommend continued observation/supportive care and no need for acute intervention at this time since she is not very symptomatic from this standpoint and kidney function close to normal  -She will need a follow-up ultrasound and follow-up with them as outpatient  -Monitor urine output, IV fluids for now, hold ACE   Hyponatremia -Suspect secondary to diuretic (chlorthalidone) and possibly component of volume depletion and dehydration -continue hydrate, hold chlorthalidone -urine sodium 40and osmolarity 500 -asymptomatic, continue hydration, repeat labs in am.  Mild AKI -hold ACE, hydrate and monitor, doubt Hydronephrosis contributing much, ua unremarkable, renal dosing meds.  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 Junious Silk by Dr. Broadus John on admission)  Procedures:  US guided paracentesis on 1/28  Antibiotics:  none   Objective: BP 122/56 mmHg  Pulse 84  Temp(Src) 98.3 F (36.8 C) (Oral)  Resp 16  Ht 5\' 1"  (1.549 m)  Wt 64.411 kg (142 lb)  BMI 26.84 kg/m2  SpO2 100%  Intake/Output Summary (Last 24 hours) at 12/30/15 1538 Last data filed at 12/30/15 1300  Gross per 24 hour  Intake 1954.5 ml  Output      0 ml  Net 1954.5 ml   Filed Weights   12/29/15 1714  Weight: 64.411 kg (142 lb)    Exam:   General:  NAD  Cardiovascular: RRR  Respiratory: CTABL  Abdomen: distended, Soft/NT, positive BS  Musculoskeletal: No Edema  Neuro: aaox3  Data Reviewed: Basic Metabolic Panel:  Recent Labs Lab 12/29/15 1242 12/30/15 0430  NA 118* 118*  K 4.9 5.0  CL 86* 89*  CO2 21* 18*  GLUCOSE 178* 135*  BUN 16 17  CREATININE 1.35* 1.34*  CALCIUM 9.0 8.2*   Liver Function Tests:  Recent Labs Lab 12/29/15 1242 12/30/15 0430  AST 21 18  ALT 14 12*  ALKPHOS 44 36*  BILITOT 0.5 0.6  PROT 6.9 5.7*  ALBUMIN 3.8 3.1*    Recent Labs Lab 12/29/15 1242  LIPASE 36   No results for input(s): AMMONIA in the last 168 hours. CBC:  Recent Labs Lab 12/29/15 1242 12/30/15 0430  WBC 10.5 9.7  HGB 10.2* 8.9*  HCT 28.9* 25.2*  MCV 84.5 86.3  PLT 330 292  Cardiac Enzymes:   No results for input(s): CKTOTAL, CKMB, CKMBINDEX, TROPONINI in the last 168 hours. BNP (last 3 results) No results for input(s): BNP in the last 8760 hours.  ProBNP (last 3 results) No results for input(s): PROBNP in the last 8760 hours.  CBG:  Recent Labs Lab 12/30/15 1407  GLUCAP 168*    No results found for this or any previous visit (from the past 240 hour(s)).    Studies: US Paracentesis  12/30/2015  [name], PA-C     12/30/2015  1:26 PM Successful US guided paracentesis from RUQ. Yielded 4L of bloody ascitic fluid. No immediate complications. Pt tolerated well. Specimen was sent for labs. [name] PA-C 12/30/2015 1:07 PM    Scheduled Meds: . enoxaparin (LOVENOX) injection  30 mg Subcutaneous Q24H  . insulin aspart  0-15 Units Subcutaneous TID WC  . latanoprost  1 drop Both Eyes QHS  . pantoprazole  40 mg Oral Daily  . pravastatin  20 mg Oral Daily  . sodium chloride  1 g Oral TID WC    Continuous Infusions: . sodium chloride 75 mL/hr at 12/30/15 [id number]     Time spent: 38mins  Barbaraann Avans MD, PhD  [facility] Pager [phone]. If 7PM-7AM, please contact night-coverage at [email or web address], password [facility] 12/30/2015, 3:38 PM  LOS: 1 day

## 2015-12-30 NOTE — Progress Notes (Signed)
Provider notified of critical lab value via text page on [email or web address]

## 2015-12-30 NOTE — Procedures (Signed)
Successful US guided paracentesis from RUQ.  Yielded 4L of bloody ascitic fluid.  No immediate complications.  Pt tolerated well.   Specimen was sent for labs.  [name] PA-C 12/30/2015 1:07 PM

## 2015-12-31 LAB — GLUCOSE, CAPILLARY
GLUCOSE-CAPILLARY: 194 mg/dL — AB (ref 65–99)
GLUCOSE-CAPILLARY: 202 mg/dL — AB (ref 65–99)
Glucose-Capillary: 124 mg/dL — ABNORMAL HIGH (ref 65–99)
Glucose-Capillary: 147 mg/dL — ABNORMAL HIGH (ref 65–99)

## 2015-12-31 LAB — BASIC METABOLIC PANEL
Anion gap: 7 (ref 5–15)
BUN: 14 mg/dL (ref 6–20)
CALCIUM: 7.4 mg/dL — AB (ref 8.9–10.3)
CO2: 18 mmol/L — AB (ref 22–32)
Chloride: 94 mmol/L — ABNORMAL LOW (ref 101–111)
Creatinine, Ser: 1.33 mg/dL — ABNORMAL HIGH (ref 0.44–1.00)
GFR calc Af Amer: 47 mL/min — ABNORMAL LOW (ref >60)
GFR, EST NON AFRICAN AMERICAN: 41 mL/min — AB (ref >60)
GLUCOSE: 144 mg/dL — AB (ref 65–99)
Potassium: 4.6 mmol/L (ref 3.5–5.1)
Sodium: 119 mmol/L — CL (ref 135–145)

## 2015-12-31 LAB — TSH: TSH: 6.232 u[IU]/mL — ABNORMAL HIGH (ref 0.350–4.500)

## 2015-12-31 LAB — CBC
HCT: 26.3 % — ABNORMAL LOW (ref 36.0–46.0)
Hemoglobin: 9.3 g/dL — ABNORMAL LOW (ref 12.0–15.0)
MCH: 30.6 pg (ref 26.0–34.0)
MCHC: 35.4 g/dL (ref 30.0–36.0)
MCV: 86.5 fL (ref 78.0–100.0)
Platelets: 310 10*3/uL (ref 150–400)
RBC: 3.04 MIL/uL — ABNORMAL LOW (ref 3.87–5.11)
RDW: 12.4 % (ref 11.5–15.5)
WBC: 8.9 10*3/uL (ref 4.0–10.5)

## 2015-12-31 MED ORDER — ENOXAPARIN SODIUM 40 MG/0.4ML ~~LOC~~ SOLN
40.0000 mg | SUBCUTANEOUS | Status: DC
  Administered 2015-12-31 – 2016-01-01 (×2): 40 mg via SUBCUTANEOUS
  Filled 2015-12-31 (×2): qty 0.4

## 2015-12-31 MED ORDER — SODIUM CHLORIDE 1 G PO TABS
2.0000 g | ORAL_TABLET | Freq: Three times a day (TID) | ORAL | Status: AC
  Administered 2015-12-31 – 2016-01-01 (×3): 2 g via ORAL
  Filled 2015-12-31 (×5): qty 2

## 2015-12-31 NOTE — Progress Notes (Signed)
PROGRESS NOTE  Brittany Schwartz [id number] DOB: 07/08/1951 DOA: 12/29/2015 PCP: Brittany Greenhouse, MD  HPI/Recap of past 24 hours:   remain hyponatremia, denies ab pain, moving bowel and urinate well, denies confusion, family in room  Assessment/Plan: Active Problems:   Endometrial cancer ([place])   Diabetes mellitus ([place])   Hyponatremia   Type 2 diabetes mellitus without complication ([facility])   Essential hypertension   Metastatic cancer ([facility])   Ascites   Hydronephrosis, left   Abdominal distension   Non-insulin dependent type 2 diabetes mellitus ([place])  Endometrial cancer , metastatic  -Now with extensive new carcinomatosis consistent with metastatic disease and malignant ascites , rising CA125. -s/p ultrasound-guided paracentesis with 4liter fluids removed, cell count no SBP, cytology pending  -oncology  Dr. Marko Schwartz notified of admission, she will follow-up on Monday  Left hydronephrosis  -Secondary to pelvic mass, discussed with urology Dr. Junious Schwartz  -Recommend continued observation/supportive care and no need for acute intervention at this time since she is not very symptomatic from this standpoint and kidney function close to normal  -She will need a follow-up ultrasound and follow-up with them as outpatient  -Monitor urine output, IV fluids for now, hold ACE   Hyponatremia -Suspect secondary to diuretic (chlorthalidone) and possibly component of volume depletion and dehydration -continue hydrate, hold chlorthalidone -urine sodium 40and osmolarity 500 -asymptomatic, continue hydration, repeat labs in am, remain hyponatremia, due to concerning ascites will try to avoid excessive volume, continue ns at current rate, ad salt tabs and try fluids ( free water) restriction, repeat labs in am,  Mild AKI -hold ACE, hydrate and monitor, doubt Hydronephrosis contributing much, ua unremarkable, renal dosing meds.  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 Junious Schwartz by Dr. Broadus Schwartz on admission)  Procedures:  US guided paracentesis on 1/28  Antibiotics:  none   Objective: BP 105/59 mmHg  Pulse 88  Temp(Src) 98.1 F (36.7 C) (Oral)  Resp 16  Ht 5\' 1"  (1.549 m)  Wt 64.411 kg (142 lb)  BMI 26.84 kg/m2  SpO2 98%  Intake/Output Summary (Last 24 hours) at 12/31/15 0811 Last data filed at 12/30/15 1700  Gross per 24 hour  Intake    375 ml  Output      2 ml  Net    373 ml   Filed Weights   12/29/15 1714  Weight: 64.411 kg (142 lb)    Exam:   General:  NAD  Cardiovascular: RRR  Respiratory: CTABL  Abdomen: distended, Soft/NT, positive BS  Musculoskeletal: No Edema  Neuro: aaox3  Data Reviewed: Basic Metabolic Panel:  Recent Labs Lab 12/29/15 1242 12/30/15 0430 12/31/15 0405  NA 118* 118* 119*  K 4.9 5.0 4.6  CL 86* 89* 94*  CO2 21* 18* 18*  GLUCOSE 178* 135* 144*  BUN 16 17 14   CREATININE 1.35* 1.34* 1.33*  CALCIUM 9.0 8.2* 7.4*   Liver Function Tests:  Recent Labs Lab 12/29/15 1242 12/30/15 0430  AST 21 18  ALT 14 12*  ALKPHOS 44 36*  BILITOT 0.5 0.6  PROT 6.9 5.7*  ALBUMIN 3.8 3.1*    Recent Labs Lab 12/29/15 1242  LIPASE 36   No results for input(s): AMMONIA in the last 168 hours. CBC:  Recent Labs Lab 12/29/15 1242 12/30/15 0430 12/31/15 0405  WBC 10.5 9.7 8.9  HGB 10.2* 8.9* 9.3*  HCT 28.9* 25.2* 26.3*  MCV 84.5 86.3 86.5  PLT 330 292 310   Cardiac Enzymes:   No results for input(s): CKTOTAL, CKMB, CKMBINDEX, TROPONINI in the last 168 hours. BNP (last 3 results) No results for input(s):  BNP in the last 8760 hours.  ProBNP (last 3 results) No results for input(s): PROBNP in the last 8760 hours.  CBG:  Recent Labs Lab 12/30/15 1407 12/30/15 1732 12/30/15 2120  GLUCAP 168* 149* 159*    No results found for this or any previous visit (from the past 240 hour(s)).   Studies: US Paracentesis  12/30/2015  [name], PA-C     12/30/2015  1:26 PM Successful US guided paracentesis from RUQ. Yielded 4L of bloody ascitic fluid. No immediate complications. Pt tolerated well. Specimen was sent for labs. [name] PA-C 12/30/2015 1:07 PM    Scheduled Meds: . enoxaparin (LOVENOX) injection  30 mg Subcutaneous Q24H  . insulin aspart  0-15 Units Subcutaneous TID WC  . latanoprost  1 drop Both Eyes QHS  . pantoprazole  40 mg Oral Daily  . pravastatin  20 mg Oral Daily  . sodium chloride  2 g Oral TID WC    Continuous Infusions: . sodium chloride 1,000 mL (12/30/15 1746)     Time spent: 35mins  Brittany Victor MD, PhD  [facility] Pager [phone]. If 7PM-7AM, please contact night-coverage at [email or web address], password [facility] 12/31/2015, 8:11 AM  LOS: 2 days

## 2015-12-31 NOTE — Progress Notes (Signed)
Pt ambulating in the hall with family member, tolerating well.Pt  is drinking gatorade. Family bringing food from home for pt to eat. Several family members at bedside.

## 2015-12-31 NOTE — Progress Notes (Signed)
Provider notified of critical lab via [email or web address].  Na 119.

## 2016-01-01 ENCOUNTER — Ambulatory Visit (HOSPITAL_COMMUNITY): Payer: BLUE CROSS/BLUE SHIELD

## 2016-01-01 DIAGNOSIS — I10 Essential (primary) hypertension: Secondary | ICD-10-CM

## 2016-01-01 DIAGNOSIS — R180 Malignant ascites: Secondary | ICD-10-CM

## 2016-01-01 DIAGNOSIS — R140 Abdominal distension (gaseous): Secondary | ICD-10-CM

## 2016-01-01 DIAGNOSIS — K219 Gastro-esophageal reflux disease without esophagitis: Secondary | ICD-10-CM

## 2016-01-01 DIAGNOSIS — C541 Malignant neoplasm of endometrium: Secondary | ICD-10-CM

## 2016-01-01 DIAGNOSIS — J910 Malignant pleural effusion: Secondary | ICD-10-CM

## 2016-01-01 DIAGNOSIS — E119 Type 2 diabetes mellitus without complications: Secondary | ICD-10-CM

## 2016-01-01 DIAGNOSIS — G622 Polyneuropathy due to other toxic agents: Secondary | ICD-10-CM

## 2016-01-01 LAB — CBC
HCT: 25.9 % — ABNORMAL LOW (ref 36.0–46.0)
Hemoglobin: 9 g/dL — ABNORMAL LOW (ref 12.0–15.0)
MCH: 30.2 pg (ref 26.0–34.0)
MCHC: 34.7 g/dL (ref 30.0–36.0)
MCV: 86.9 fL (ref 78.0–100.0)
PLATELETS: 346 10*3/uL (ref 150–400)
RBC: 2.98 MIL/uL — AB (ref 3.87–5.11)
RDW: 12.6 % (ref 11.5–15.5)
WBC: 12.6 10*3/uL — AB (ref 4.0–10.5)

## 2016-01-01 LAB — BASIC METABOLIC PANEL
ANION GAP: 8 (ref 5–15)
BUN: 11 mg/dL (ref 6–20)
CO2: 17 mmol/L — ABNORMAL LOW (ref 22–32)
Calcium: 7.4 mg/dL — ABNORMAL LOW (ref 8.9–10.3)
Chloride: 97 mmol/L — ABNORMAL LOW (ref 101–111)
Creatinine, Ser: 1.3 mg/dL — ABNORMAL HIGH (ref 0.44–1.00)
GFR, EST AFRICAN AMERICAN: 49 mL/min — AB (ref >60)
GFR, EST NON AFRICAN AMERICAN: 42 mL/min — AB (ref >60)
GLUCOSE: 176 mg/dL — AB (ref 65–99)
POTASSIUM: 4.4 mmol/L (ref 3.5–5.1)
SODIUM: 122 mmol/L — AB (ref 135–145)

## 2016-01-01 LAB — HEMOGLOBIN A1C
HEMOGLOBIN A1C: 6.6 % — AB (ref 4.8–5.6)
MEAN PLASMA GLUCOSE: 143 mg/dL

## 2016-01-01 LAB — GLUCOSE, CAPILLARY
GLUCOSE-CAPILLARY: 234 mg/dL — AB (ref 65–99)
Glucose-Capillary: 160 mg/dL — ABNORMAL HIGH (ref 65–99)
Glucose-Capillary: 186 mg/dL — ABNORMAL HIGH (ref 65–99)
Glucose-Capillary: 178 mg/dL — ABNORMAL HIGH (ref 65–99)

## 2016-01-01 MED ORDER — SODIUM CHLORIDE 1 G PO TABS
1.0000 g | ORAL_TABLET | Freq: Three times a day (TID) | ORAL | Status: DC
Start: 2016-01-02 — End: 2016-01-04
  Administered 2016-01-02 – 2016-01-04 (×6): 1 g via ORAL
  Filled 2016-01-01 (×9): qty 1

## 2016-01-01 MED ORDER — SODIUM BICARBONATE 650 MG PO TABS
650.0000 mg | ORAL_TABLET | Freq: Three times a day (TID) | ORAL | Status: AC
  Administered 2016-01-01 – 2016-01-02 (×3): 650 mg via ORAL
  Filled 2016-01-01 (×2): qty 1

## 2016-01-01 NOTE — Progress Notes (Signed)
MEDICAL ONCOLOGY January 01, 2016, 8:35 AM  Hospital day 3 Antibiotics: none Chemotherapy: adjuvant carboplatin taxol completed 06-16-15  Outpatient physicians:  [name]/ [name]; [name], MD (PCP, [facility]), [place], [place] hospitalist service letting me know of admission. EMR reviewed. Patient seen with family present. After rounds I have also discussed with Dr Denman George. Patient has been on observation for IIIA grade 3 serous endometrial carcinoma since completing adjuvant carboplatin taxol 06-16-15 and vaginal brachytherapy 09-2015. She saw Dr Denman George with no findings of concern in 11-2015.  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 Peripheral IV access was not easy for adjuvant chemotherapy.  Subjective: Felt badly with abdominal distension for no more than 1-2 weeks PTA. Abdomen less distended after paracentesis for 4 liters of bloody fluid on 12-30-15, but is again becoming more distended already. Bowels moved today, no abdominal pain, no vomiting, is eating (vegetarian food from home). She denies SOB, LE swelling, any bleeding. She has been up walking. She is voiding. She had been drinking primarily water PTA.  No central catheter; present IV is in left antecubital.   ONCOLOGIC HISTORY  Patient presented to Dr [name] 01-03-15 with vaginal spotting x 1 year. Pelvic US showed 33 mm endometrial stripe with ovaries normal, and endometrial biopsy also 01-03-15 reportedly had grade 3 endometrial adenocarcinoma. She was seen in consultation by Dr Denman George on 2-8-1. CT CAP 01-13-15 had negative chest, no adenopathy, mild fatty liver, markedly thickened endometrium and no evidence of metastatic disease outside of uterus. CA 125 preoperatively on 01-13-15 was 22.9. Surgery by Dr [name] 01-19-15 was robotic hysterectomy, BSO and resection of nodule at sigmoid serosa. At surgery there was milial tumor studding with 5-10 mm implants on bladder  peritoneum and miliary disease on pelvic sidewalls, without upper abdominal disease evident, and omentum appeared normal. 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 She was seen for post operative follow up by Dr Denman George on 02-03-15, with recommendation for 6 cycles of taxol carboplatin, then consideration of external beam RT + vaginal brachytherapy if CR. First carboplatin taxol was given 02-16-2015; she was neutropenic with ANC 0.7 by day 15 cycle 1. She was transfused 2 units PRBCs 5-19/5-20-16 for Hgb 7.8. Cycles 4 and 5 were both delayed due to thrombocytopenia. She completed chemotherapy 06-16-15 and vaginal brachytherapy30 gray in 5 fractions as of 09/14/2015  Objective: Vital signs in last 24 hours: Blood pressure 116/63, pulse 107, temperature 98.4 F (36.9 C), temperature source Oral, resp. rate 16, height 5\' 1"  (1.549 m), weight 142 lb (64.411 kg), SpO2 99 %. Awake, alert, NAD supine in bed on RA. PERRL. Oral mucosa moist and clear. No JVD. Peripheral IV as noted, site ok. Heart RRR no gallop. Lungs without wheezes or rales. Abdomen distended, nearly tight, occasional BS, not tender.LE no pitting edema, cords, tenderness. Skin without rash or ecchymosis. Minimal peripheral neuropathy fingertips and distal feet. Neuro otherwise nonfocal.   Intake/Output from previous day: 01/29 0701 - 01/30 0700 In: 2370 [Schwartz.O.:345; I.V.:2025] Out: -  Intake/Output this shift:   IVF NS at 75    Lab Results:  Recent Labs  12/31/15 0405 01/01/16 0430  WBC 8.9 12.6*  HGB 9.3* 9.0*  HCT 26.3* 25.9*  PLT 310 346   BMET  Recent Labs  12/31/15 0405 01/01/16 0430  NA 119* 122*  K 4.6 4.4  CL 94* 97*  CO2 18* 17*  GLUCOSE 144* 176*  BUN 14 11  CREATININE 1.33* 1.30*  CALCIUM 7.4* 7.4*    Studies/Results: US Paracentesis  12/30/2015  [name],  PA-C     12/30/2015  1:26 PM Successful US guided paracentesis from RUQ. Yielded 4L of bloody ascitic fluid. No immediate complications. Pt tolerated well. Specimen was sent for labs. [name] PA-C 12/30/2015 1:07 PM   ADDENDUM Pathology Per phone conversation with Dr Margarito Courser after AM rounds, cytology of ascites confirms malignant cells consistent with gyn primary. Full cytology report pending Discussion: patient seen with daughter and 2 other family members this evening, with discussion of cytology findings. Will request repeat paracentesis, echocardiogram prior to decision re adriamycin/ doxil, and request for PAC either while in hospital or after DC. Expect chemo will be started outpatient, in attempt to control disease including malignant ascites. PO fluid restriction will not improve ascites, as we now know this to be malignant. IV NS to improve Na and chloride will be helpful even if transient, especially if trying to improve chances that she can be managed outpatient.    Assessment/Plan: 1.grade 3 serous/ endometrioid endometrial carcinoma: rapidly symptomatic recurrence with malignant ascites 6 months from completion of adjuvant therapy for IIIA disease. The malignant ascites will need to be managed with prn paracenteses until/ unless systemic treatment can improve this. Dehydration due to third spacing fluid, and associated renal complications, will be ongoing problem until improvement in the malignant process. I have DCd oral fluid restriction and encouraged gatorade. If Na still low in AM, would resume IV NS at ~ 100 cc/ hr while in hospital.  Needs echocardiogram for LV function as we are considering adriamycin or doxil. Needs PAC. Needs another paracentesis for symptoms. 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 Please page between my rounds if needed [phone] Brittany Schwartz,Brittany Schwartz

## 2016-01-01 NOTE — Progress Notes (Signed)
PROGRESS NOTE  Brittany Schwartz [id number] DOB: July 11, 1951 DOA: 12/29/2015 PCP: [name], MD  HPI/Recap of past 24 hours:   remain hyponatremia, but slowing improving, c/o abdominal distension seems to getting worse compare to Saturday after paracentesis,  denies ab pain, moving bowel and urinate well, denies confusion, daughter and husband in room  Assessment/Plan: Active Problems:   Endometrial cancer ([place])   Diabetes mellitus ([place])   Hyponatremia   Type 2 diabetes mellitus without complication ([place])   Essential hypertension   Metastatic cancer ([place])   Ascites   Hydronephrosis, left   Abdominal distension   Non-insulin dependent type 2 diabetes mellitus ([place])  Endometrial cancer , metastatic , ascites -Now with extensive new carcinomatosis consistent with metastatic disease and malignant ascites , rising CA125. -s/p ultrasound-guided paracentesis with 4liter fluids removed, cell count no SBP, cytology pending  -oncology  Dr. Marko Plume consulted -may need to repeat paracentesis, last on Saturday.   Left hydronephrosis  -Secondary to pelvic mass, discussed with urology Dr. Junious Silk  -Recommend continued observation/supportive care and no need for acute intervention at this time since she is not very symptomatic from this standpoint and kidney function close to normal  -She will need a follow-up ultrasound and follow-up with them as outpatient  -Monitor urine output, IV fluids for now, hold ACE  -d/c ivf due to report increase abdominal distention  Hyponatremia -Suspect secondary to diuretic (chlorthalidone) and possibly component of volume depletion and dehydration -continue hydrate, hold chlorthalidone -urine sodium 40and osmolarity 500 -asymptomatic, continue hydration, repeat labs in am, remain hyponatremia, due to concerning ascites will try to avoid excessive volume, continue ns at current rate, ad salt tabs and try fluids ( free water) restriction, repeat labs  in am, -1/30 continue salt tabs, free water restriction, d/c ivf due to increasing abdominal distension  Mild AKI -hold ACE, hydrate and monitor, doubt Hydronephrosis contributing much, ua unremarkable, renal dosing meds.  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 Junious Silk by Dr. Broadus John on admission)  Procedures:  US guided paracentesis on 1/28  Antibiotics:  none   Objective: BP 106/70 mmHg  Pulse 112  Temp(Src) 97.7 F (36.5 C) (Oral)  Resp 18  Ht 5\' 1"  (1.549 m)  Wt 64.411 kg (142 lb)  BMI 26.84 kg/m2  SpO2 100%  Intake/Output Summary (Last 24 hours) at 01/01/16 1753 Last data filed at 01/01/16 0830  Gross per 24 hour  Intake    120 ml  Output      0 ml  Net    120 ml   Filed Weights   12/29/15 1714  Weight: 64.411 kg (142 lb)    Exam:   General:  NAD  Cardiovascular: RRR  Respiratory: CTABL  Abdomen: distended, Soft/NT, positive BS  Musculoskeletal: No Edema  Neuro: aaox3  Data Reviewed: Basic Metabolic Panel:  Recent Labs Lab 12/29/15 1242 12/30/15 0430 12/31/15 0405 01/01/16 0430  NA 118* 118* 119* 122*  K 4.9 5.0 4.6 4.4  CL 86* 89* 94* 97*  CO2 21* 18* 18* 17*  GLUCOSE 178* 135* 144* 176*  BUN 16 17 14 11   CREATININE  1.35* 1.34* 1.33* 1.30*  CALCIUM 9.0 8.2* 7.4* 7.4*   Liver Function Tests:  Recent Labs Lab 12/29/15 1242 12/30/15 0430  AST 21 18  ALT 14 12*  ALKPHOS 44 36*  BILITOT 0.5 0.6  PROT 6.9 5.7*  ALBUMIN 3.8 3.1*    Recent Labs Lab 12/29/15 1242  LIPASE 36   No results for  input(s): AMMONIA in the last 168 hours. CBC:  Recent Labs Lab 12/29/15 1242 12/30/15 0430 12/31/15 0405 01/01/16 0430  WBC 10.5 9.7 8.9 12.6*  HGB 10.2* 8.9* 9.3* 9.0*  HCT 28.9* 25.2* 26.3* 25.9*  MCV 84.5 86.3 86.5 86.9  PLT 330 292 310 346   Cardiac Enzymes:   No results for input(s): CKTOTAL, CKMB, CKMBINDEX, TROPONINI in the last 168 hours. BNP (last 3 results) No results for input(s): BNP in the last 8760 hours.  ProBNP (last 3 results) No results for input(s): PROBNP in the last 8760 hours.  CBG:  Recent Labs Lab 12/31/15 1752 12/31/15 2145 01/01/16 0846 01/01/16 1258 01/01/16 1701  GLUCAP 202* 147* 160* 186* 178*    No results found for this or any previous visit (from the past 240 hour(s)).   Studies: No results found.  Scheduled Meds: . enoxaparin (LOVENOX) injection  40 mg Subcutaneous Q24H  . insulin aspart  0-15 Units Subcutaneous TID WC  . latanoprost  1 drop Both Eyes QHS  . pantoprazole  40 mg Oral Daily  . pravastatin  20 mg Oral Daily  . sodium bicarbonate  650 mg Oral TID  . [START ON 01/02/2016] sodium chloride  1 g Oral TID WC    Continuous Infusions:     Time spent: 74mins  Rosellen Lichtenberger MD, PhD  [facility] Pager [phone]. If 7PM-7AM, please contact night-coverage at [email or web address], password [facility] 01/01/2016, 5:53 PM  LOS: 3 days

## 2016-01-02 ENCOUNTER — Telehealth: Payer: Self-pay | Admitting: Oncology

## 2016-01-02 ENCOUNTER — Inpatient Hospital Stay (HOSPITAL_COMMUNITY): Payer: BLUE CROSS/BLUE SHIELD

## 2016-01-02 ENCOUNTER — Other Ambulatory Visit: Payer: Self-pay | Admitting: Oncology

## 2016-01-02 ENCOUNTER — Telehealth: Payer: Self-pay

## 2016-01-02 DIAGNOSIS — C541 Malignant neoplasm of endometrium: Secondary | ICD-10-CM

## 2016-01-02 DIAGNOSIS — R180 Malignant ascites: Secondary | ICD-10-CM | POA: Insufficient documentation

## 2016-01-02 DIAGNOSIS — C55 Malignant neoplasm of uterus, part unspecified: Secondary | ICD-10-CM

## 2016-01-02 DIAGNOSIS — D509 Iron deficiency anemia, unspecified: Secondary | ICD-10-CM

## 2016-01-02 DIAGNOSIS — Z09 Encounter for follow-up examination after completed treatment for conditions other than malignant neoplasm: Secondary | ICD-10-CM

## 2016-01-02 LAB — CBC
HEMATOCRIT: 24.5 % — AB (ref 36.0–46.0)
HEMOGLOBIN: 8.3 g/dL — AB (ref 12.0–15.0)
MCH: 29.4 pg (ref 26.0–34.0)
MCHC: 33.9 g/dL (ref 30.0–36.0)
MCV: 86.9 fL (ref 78.0–100.0)
Platelets: 319 10*3/uL (ref 150–400)
RBC: 2.82 MIL/uL — AB (ref 3.87–5.11)
RDW: 12.6 % (ref 11.5–15.5)
WBC: 11.8 10*3/uL — ABNORMAL HIGH (ref 4.0–10.5)

## 2016-01-02 LAB — BASIC METABOLIC PANEL
ANION GAP: 6 (ref 5–15)
BUN: 11 mg/dL (ref 6–20)
CALCIUM: 7.3 mg/dL — AB (ref 8.9–10.3)
CO2: 18 mmol/L — ABNORMAL LOW (ref 22–32)
CREATININE: 1.36 mg/dL — AB (ref 0.44–1.00)
Chloride: 98 mmol/L — ABNORMAL LOW (ref 101–111)
GFR, EST AFRICAN AMERICAN: 46 mL/min — AB (ref >60)
GFR, EST NON AFRICAN AMERICAN: 40 mL/min — AB (ref >60)
Glucose, Bld: 165 mg/dL — ABNORMAL HIGH (ref 65–99)
Potassium: 4.4 mmol/L (ref 3.5–5.1)
SODIUM: 122 mmol/L — AB (ref 135–145)

## 2016-01-02 LAB — GLUCOSE, CAPILLARY
Glucose-Capillary: 150 mg/dL — ABNORMAL HIGH (ref 65–99)
Glucose-Capillary: 155 mg/dL — ABNORMAL HIGH (ref 65–99)
Glucose-Capillary: 256 mg/dL — ABNORMAL HIGH (ref 65–99)
Glucose-Capillary: 138 mg/dL — ABNORMAL HIGH (ref 65–99)

## 2016-01-02 LAB — PROTIME-INR
INR: 1.35 (ref 0.00–1.49)
PROTHROMBIN TIME: 16.3 s — AB (ref 11.6–15.2)

## 2016-01-02 MED ORDER — LORAZEPAM 0.5 MG PO TABS: ORAL_TABLET | ORAL | Status: DC

## 2016-01-02 MED ORDER — CEFAZOLIN SODIUM-DEXTROSE 2-3 GM-% IV SOLR
2.0000 g | INTRAVENOUS | Status: AC
  Administered 2016-01-02: 2 g via INTRAVENOUS

## 2016-01-02 MED ORDER — MIDAZOLAM HCL 2 MG/2ML IJ SOLN
INTRAMUSCULAR | Status: AC
  Filled 2016-01-02: qty 4

## 2016-01-02 MED ORDER — LIDOCAINE-PRILOCAINE 2.5-2.5 % EX CREA: TOPICAL_CREAM | CUTANEOUS | Status: AC

## 2016-01-02 MED ORDER — CEFAZOLIN SODIUM-DEXTROSE 2-3 GM-% IV SOLR
INTRAVENOUS | Status: AC
  Filled 2016-01-02: qty 50

## 2016-01-02 MED ORDER — FENTANYL CITRATE (PF) 100 MCG/2ML IJ SOLN
INTRAMUSCULAR | Status: AC
  Filled 2016-01-02: qty 2

## 2016-01-02 MED ORDER — MIDAZOLAM HCL 2 MG/2ML IJ SOLN
INTRAMUSCULAR | Status: AC | PRN
  Administered 2016-01-02: 1 mg via INTRAVENOUS
  Administered 2016-01-02: 0.5 mg via INTRAVENOUS

## 2016-01-02 MED ORDER — ONDANSETRON HCL 8 MG PO TABS: ORAL_TABLET | ORAL | Status: DC

## 2016-01-02 MED ORDER — LIDOCAINE-EPINEPHRINE 2 %-1:100000 IJ SOLN
INTRAMUSCULAR | Status: AC
  Filled 2016-01-02: qty 1

## 2016-01-02 MED ORDER — PANTOPRAZOLE SODIUM 40 MG PO TBEC: 40.0000 mg | DELAYED_RELEASE_TABLET | Freq: Every day | ORAL | Status: DC

## 2016-01-02 MED ORDER — SODIUM CHLORIDE 0.9 % IV SOLN
INTRAVENOUS | Status: DC
  Administered 2016-01-02: 13:00:00 via INTRAVENOUS

## 2016-01-02 MED ORDER — FENTANYL CITRATE (PF) 100 MCG/2ML IJ SOLN
INTRAMUSCULAR | Status: AC | PRN
  Administered 2016-01-02: 50 ug via INTRAVENOUS
  Administered 2016-01-02: 25 ug via INTRAVENOUS

## 2016-01-02 MED ORDER — PROCHLORPERAZINE MALEATE 10 MG PO TABS: 10.0000 mg | ORAL_TABLET | Freq: Four times a day (QID) | ORAL | Status: DC | PRN

## 2016-01-02 NOTE — Telephone Encounter (Signed)
      #  2 message re first adriamycin possibly 2-3. In [facility] 1324, likely DC this afternoon or tomorrow   Needs EMLA to pharmacy (new PAC today)   Will need ativan and compazine 10 mg q 6 hr prn #20 available just after aloxi day of first adria, then zofran for later. Please be sure daughter understands meds also

## 2016-01-02 NOTE — Telephone Encounter (Signed)
Went to [employer] room 1324 and reviewed the information sheet with side-effects of adriamycin with patient  daughter Brittany Schwartz. Reviewed application of EMLA cream to PAC prior to use. Reviewed the antiemetics with patient and daughter. Noted that she doe not use the Zofran for 72 hours after the Aloxi.  Fine to use Ativan and Compazine. Daughter documented information.   Documented Chemotherapy teaching  In patient education tab in EMR.

## 2016-01-02 NOTE — Sedation Documentation (Signed)
Patient denies pain and is resting comfortably.  

## 2016-01-02 NOTE — Progress Notes (Signed)
PROGRESS NOTE  Brittany Schwartz [id number] DOB: 06/24/1951 DOA: 12/29/2015 PCP: [name], MD  HPI/Recap of past 24 hours:   returned from repeat  Paracentesis and port A cath placement,  denies ab pain, moving bowel and urinate well, denies confusion, daughter  in room  Assessment/Plan: Active Problems:   Endometrial cancer ([place])   Diabetes mellitus ([place])   Hyponatremia   Type 2 diabetes mellitus without complication (HCC)   Essential hypertension   Metastatic cancer (HCC)   Ascites   Hydronephrosis, left   Abdominal distension   Non-insulin dependent type 2 diabetes mellitus (HCC)  Endometrial cancer , metastatic , malignant ascites -Now with extensive new carcinomatosis consistent with metastatic disease and malignant ascites , rising CA125. -s/p ultrasound-guided paracentesis with 4liter fluids removed, cell count no SBP, cytology pending  -oncology  Dr. Marko Plume consulted -repeat paracentesis on 1/31, last on 1/28  Left hydronephrosis  -Secondary to pelvic mass, discussed with urology Dr. Junious Silk  -Recommend continued observation/supportive care and no need for acute intervention at this time since she is not very symptomatic from this standpoint and kidney function close to normal  -She will need a follow-up renal ultrasound and follow-up with them as outpatient  -Monitor urine output, IV fluids for now, hold ACE    Hyponatremia -Suspect secondary to diuretic (chlorthalidone) and possibly component of volume depletion and dehydration -continue hydrate, hold chlorthalidone -urine sodium 40and osmolarity 500 -asymptomatic,  - continue salt tabs, encourage beverages containing salt, restarted ivf after paracentesis.  Mild AKI -hold ACE, hydrate and monitor, doubt Hydronephrosis contributing much, ua unremarkable, renal dosing meds. Started sodium bicarb due to mild metabolic acidosis.  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   Consultants:  Oncology (Dr Marko Plume)  Urology (phone call to Dr. Junious Silk by Dr. Broadus John on admission)  Procedures:  US guided paracentesis on 1/28 and 1/31  Port A cath placed by IR on 1/31  Antibiotics:  none   Objective: BP 129/76 mmHg  Pulse 99  Temp(Src) 98.9 F (37.2 C) (Oral)  Resp 16  Ht 5\' 1"  (1.549 m)  Wt 64.411 kg (142 lb)  BMI 26.84 kg/m2  SpO2 100%  Intake/Output Summary (Last 24 hours) at 01/02/16 0851 Last data filed at 01/02/16 0600  Gross per 24 hour  Intake   1162 ml  Output      0 ml  Net   1162 ml   Filed Weights   12/29/15 1714  Weight: 64.411 kg (142 lb)    Exam:   General:  NAD  Cardiovascular: RRR  Respiratory: CTABL,   Chest: s/p Port A cath placement on 1/31  Abdomen: distended, Soft/NT, positive BS  Musculoskeletal: No Edema  Neuro: aaox3  Data Reviewed: Basic Metabolic Panel:  Recent Labs Lab 12/29/15 1242 12/30/15 0430 12/31/15 0405 01/01/16 0430 01/02/16 0413  NA 118* 118* 119* 122* 122*  K 4.9 5.0 4.6 4.4 4.4  CL 86* 89* 94* 97* 98*  CO2 21* 18* 18* 17* 18*  GLUCOSE 178* 135* 144* 176* 165*  BUN 16  17 14 11 11   CREATININE 1.35* 1.34* 1.33* 1.30* 1.36*  CALCIUM 9.0 8.2* 7.4* 7.4* 7.3*   Liver Function Tests:  Recent Labs Lab 12/29/15 1242 12/30/15 0430  AST 21 18  ALT 14 12*  ALKPHOS 44 36*  BILITOT 0.5 0.6  PROT 6.9 5.7*  ALBUMIN 3.8 3.1*    Recent Labs Lab 12/29/15 1242  LIPASE 36   No results for input(s): AMMONIA in the last 168  hours. CBC:  Recent Labs Lab 12/29/15 1242 12/30/15 0430 12/31/15 0405 01/01/16 0430 01/02/16 0413  WBC 10.5 9.7 8.9 12.6* 11.8*  HGB 10.2* 8.9* 9.3* 9.0* 8.3*  HCT 28.9* 25.2* 26.3* 25.9* 24.5*  MCV 84.5 86.3 86.5 86.9 86.9  PLT 330 292 310 346 319   Cardiac Enzymes:   No results for input(s): CKTOTAL, CKMB, CKMBINDEX, TROPONINI in the last 168 hours. BNP (last 3 results) No results for input(s): BNP in the last 8760 hours.  ProBNP (last 3 results) No results for input(s): PROBNP in the last 8760 hours.  CBG:  Recent Labs Lab 12/31/15 2145 01/01/16 0846 01/01/16 1258 01/01/16 1701 01/01/16 2045  GLUCAP 147* 160* 186* 178* 234*    No results found for this or any previous visit (from the past 240 hour(s)).   Studies: No results found.  Scheduled Meds: . insulin aspart  0-15 Units Subcutaneous TID WC  . latanoprost  1 drop Both Eyes QHS  . pantoprazole  40 mg Oral Daily  . pravastatin  20 mg Oral Daily  . sodium bicarbonate  650 mg Oral TID  . sodium chloride  1 g Oral TID WC    Continuous Infusions:     Time spent: 39mins  [name] MD, PhD  [facility] Pager [phone]. If 7PM-7AM, please contact night-coverage at [email or web address], password [facility] 01/02/2016, 8:51 AM  LOS: 4 days

## 2016-01-02 NOTE — Progress Notes (Signed)
  Echocardiogram 2D Echocardiogram has been performed.  Bobbye Charleston 01/02/2016, 9:48 AM

## 2016-01-02 NOTE — Consult Note (Signed)
Chief Complaint: Patient was seen in consultation today for port a cath placement Chief Complaint  Patient presents with  . Abdominal Pain    Referring Physician(s): Livesay,L  History of Present Illness: Brittany Schwartz is a 65 y.o. 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 Request now received for Port-A-Cath placement for chemotherapy.  Past Medical History  Diagnosis Date  . Hypertension   . Glaucoma   . Diabetes mellitus without complication ([place])   . Palpitations   . Muscle cramps   . Postmenopausal vaginal bleeding   . Uterine cancer [facility])     Past Surgical History  Procedure Laterality Date  . Robotic assisted total hysterectomy with bilateral salpingo oopherectomy Bilateral 01/19/2015    Procedure: XI ROBOTIC ASSISTED TOTAL HYSTERECTOMY WITH BILATERAL SALPINGO OOPHORECTOMY;  Surgeon: [name], MD;  Location: [facility];  Service: Gynecology;  Laterality: Bilateral;    Allergies: Review of patient's allergies indicates no known allergies.  Medications: Prior to Admission medications   Medication Sig Start Date End Date Taking? Authorizing Provider  atenolol-chlorthalidone (TENORETIC) 50-25 MG per tablet Take 1 tablet by mouth every morning. Reported on 11/20/2015   Yes Historical Provider, MD  ferrous fumarate (HEMOCYTE - 106 MG FE) 325 (106 FE) MG TABS tablet Take 1 tablet daily  on an empty stomach with OJ or vitamin C tablet 09/11/15  Yes [name], MD  glipiZIDE (GLUCOTROL XL) 5 MG 24 hr tablet Take 5 mg by mouth 2 (two) times daily.   Yes Historical Provider, MD  latanoprost (XALATAN) 0.005 % ophthalmic solution Place 1 drop into both eyes at bedtime.   Yes Historical Provider, MD  lisinopril (PRINIVIL,ZESTRIL) 10 MG tablet Take 10 mg by mouth at bedtime.   Yes Historical Provider, MD  magnesium 30 MG tablet Take 30 mg by mouth daily.   Yes Historical  Provider, MD  metFORMIN (GLUCOPHAGE) 1000 MG tablet Take 1,000 mg by mouth 2 (two) times daily with a meal.   Yes Historical Provider, MD  pantoprazole (PROTONIX) 40 MG tablet Take 1 tablet (40 mg total) by mouth daily. 11/20/15  Yes Everitt Amber, MD  pravastatin (PRAVACHOL) 20 MG tablet Take 20 mg by mouth daily.   Yes Historical Provider, MD  timolol (BETIMOL) 0.5 % ophthalmic solution Place 1 drop into both eyes 2 (two) times daily.   Yes Historical Provider, MD  acetaminophen (TYLENOL) 325 MG tablet Take 650 mg by mouth every 6 (six) hours as needed. Reported on 11/20/2015    Historical Provider, MD  gabapentin (NEURONTIN) 100 MG capsule Take 1 capsule (100 mg total) by mouth at bedtime. Take 1 at bedtime for neuropathy Patient not taking: Reported on 12/29/2015 08/14/15   [name], MD  LORazepam (ATIVAN) 0.5 MG tablet Place 1 tablet under tongue or swallow every 6 hours as needed for nausea. Will make drowsy. Patient not taking: Reported on 11/20/2015 04/11/15   Lennis P Marko Plume, MD  ondansetron (ZOFRAN) 8 MG tablet Take 1 tablet by mouth every 8 hours as needed for nausea. Will not make drowsy. Patient not taking: Reported on 06/15/2015 02/09/15   [name], MD     Family History  Problem Relation Age of Onset  . Diabetes Father     Social History   Social History  . Marital Status: Married    Spouse Name: N/A  . Number of Children: 2  . Years of Education: N/A  Social History Main Topics  . Smoking status: Never Smoker   . Smokeless tobacco: Never Used  . Alcohol Use: No  . Drug Use: No  . Sexual Activity: Yes   Other Topics Concern  . None   Social History Narrative      Review of Systems  Constitutional: Positive for fatigue. Negative for fever and chills.  Respiratory: Positive for shortness of breath. Negative for cough.   Cardiovascular: Negative for chest pain.  Gastrointestinal: Positive for abdominal pain. Negative for nausea, vomiting and blood  in stool.  Genitourinary: Negative for dysuria and hematuria.  Musculoskeletal: Negative for back pain.  Neurological: Negative for headaches.    Vital Signs: BP 129/76 mmHg  Pulse 99  Temp(Src) 98.9 F (37.2 C) (Oral)  Resp 16  Ht 5\' 1"  (1.549 m)  Wt 142 lb (64.411 kg)  BMI 26.84 kg/m2  SpO2 100%  Physical Exam  Constitutional: She is oriented to person, place, and time. She appears well-developed and well-nourished.  Cardiovascular: Normal rate and regular rhythm.   Pulmonary/Chest: Effort normal.  Dim BS bases  Abdominal: Soft. Bowel sounds are normal. She exhibits distension. There is tenderness.  Musculoskeletal: Normal range of motion. She exhibits no edema.  Neurological: She is alert and oriented to person, place, and time.    Mallampati Score:     Imaging: Ct Abdomen Pelvis W Contrast  12/29/2015  CLINICAL DATA:  Bilateral upper abdominal pain for a week. History of endometrial cancer. The patient has completed chemotherapy and radiation therapy. Initial encounter. EXAM: CT ABDOMEN AND PELVIS WITH CONTRAST TECHNIQUE: Multidetector CT imaging of the abdomen and pelvis was performed using the standard protocol following bolus administration of intravenous contrast. CONTRAST:  80 mL OMNIPAQUE IOHEXOL 300 MG/ML  SOLN COMPARISON:  CT abdomen and pelvis 07/07/2015 FINDINGS: Mild dependent atelectasis is seen in the lung bases. Heart size is mildly enlarged. There is a trace right pleural effusion. No pericardial effusion or left pleural effusion. The patient has a new moderate to large volume of abdominal and pelvic ascites. Extensive omental caking is new since the prior examination and appears worst in the right mid abdomen. More masslike omental lesion is seen at the level of the right iliac wing and measures 6.1 cm AP by 3.3 cm transverse on image 62. Pelvic mass lesions are also identified. In the left hemipelvis, a lesion measuring approximately 6.3 cm AP by 2.9 cm  transverse is seen on image 78. The liver, gallbladder, spleen, adrenal glands, pancreas and right kidney appear normal. There is moderate left hydronephrosis with delayed excretion of contrast from the left kidney. 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 The patient is status post hysterectomy. No focal bony abnormality is identified. IMPRESSION: New extensive peritoneal carcinomatosis consistent with metastatic endometrial carcinoma with associated moderate to large volume of ascites, extensive omental caking and masses. Mass in the left pelvis results and obstruction of the left ureter and moderate hydronephrosis. These results were called by telephone at the time of interpretation on 12/29/2015 at 2:58 pm to Dr. Dalia Heading , who verbally acknowledged these results. Electronically Signed   By: Inge Rise M.D.   On: 12/29/2015 15:00   US Paracentesis  12/30/2015  [name], PA-C     12/30/2015  1:26 PM Successful US guided paracentesis from RUQ. Yielded 4L of bloody ascitic fluid. No immediate complications. Pt tolerated well. Specimen was sent for labs. [name] PA-C 12/30/2015 1:07 PM  Labs:  CBC:  Recent Labs  12/30/15 0430 12/31/15 0405 01/01/16 0430 01/02/16 0413  WBC 9.7 8.9 12.6* 11.8*  HGB 8.9* 9.3* 9.0* 8.3*  HCT 25.2* 26.3* 25.9* 24.5*  PLT 292 310 346 319    COAGS:  Recent Labs  01/02/16 0413  INR 1.35    BMP:  Recent Labs  12/30/15 0430 12/31/15 0405 01/01/16 0430 01/02/16 0413  NA 118* 119* 122* 122*  K 5.0 4.6 4.4 4.4  CL 89* 94* 97* 98*  CO2 18* 18* 17* 18*  GLUCOSE 135* 144* 176* 165*  BUN 17 14 11 11   CALCIUM 8.2* 7.4* 7.4* 7.3*  CREATININE 1.34* 1.33* 1.30* 1.36*  GFRNONAA 41* 41* 42* 40*  GFRAA 47* 47* 49* 46*    LIVER FUNCTION TESTS:  Recent Labs  08/14/15 1222 11/20/15 1256 12/29/15 1242 12/30/15 0430  BILITOT 0.33 0.39 0.5 0.6  AST 15 16 21 18   ALT 18 17 14  12*    ALKPHOS 64 64 44 36*  PROT 6.8 6.8 6.9 5.7*  ALBUMIN 4.0 3.8 3.8 3.1*    TUMOR MARKERS: No results for input(s): AFPTM, CEA, CA199, CHROMGRNA in the last 8760 hours.  Assessment and Plan:  65 y.o. female with history of grade 3 serous/endometrioid endometrial carcinoma (prior hyst/BSO/ chemo/vaginal brachytherapy 2016) and now with rapidly symptomatic recurrence with malignant ascites. Request now received for Port-A-Cath placement for additional chemotherapy as well as repeat paracentesis.Risks and benefits discussed with the patient/family including, but not limited to bleeding, infection, pneumothorax, or fibrin sheath development and need for additional procedures.All of the patient's questions were answered, patient is agreeable to proceed.Consent signed and in chart. Procedures scheduled for today.        Thank you for this interesting consult.  I greatly enjoyed meeting Brittany Schwartz and look forward to participating in their care.  A copy of this report was sent to the requesting provider on this date.  Electronically Signed: D. Rowe Robert 01/02/2016, 10:01 AM   I spent a total of 20 minutes  in face to face in clinical consultation, greater than 50% of which was counseling/coordinating care for Port-A-Cath placement

## 2016-01-02 NOTE — Procedures (Signed)
Ultrasound-guided therapeutic paracentesis performed yielding 3.5 liters of bloody  fluid. No immediate complications.

## 2016-01-02 NOTE — Telephone Encounter (Signed)
Spoke with patient re appointments for 2/3 and 2/9. Also per 1/31 pof patient being d/c today. Appointment information should appear on avs report upon d/c.

## 2016-01-02 NOTE — Telephone Encounter (Signed)
-----   Message from [name], MD sent at 01/02/2016  1:18 PM EST ----- Needs RN teaching for adriamycin, together with daughter best, prior to first treatment either Fri 2-3 or Mon 2-6. She has already had taxol [place] and already done full chemo ed class.  In [place] 1324. Likely DC this afternoon or tomorrow.  thanks

## 2016-01-02 NOTE — Progress Notes (Signed)
January 02, 2016, 1:40 PM  Hospital day 4 Antibiotics: none Chemotherapy: adjuvant carbo taxol completed 06-16-15. Plan adriamycin as outpatient at [facility] ~ 2-3 or 2-6   EMR reivewed, patient seen with daughter, discussed with RN at bedside.  Echocardiogram done this AM shows good cardiac function for either adriamycin or doxil PAC placed by IR and paracentesis for 3.5 liters bloody fluid also done this AM.  Subjective: Abdomen feels better since paracentesis, tho some transient discomfort subcostal on right at completion of procedure, now resolved. Not uncomfortable from [facility] placement.  Small BM this morning. Denies SOB. Has been NPO for the IR procedures.  Objective: Vital signs in last 24 hours: Blood pressure 127/56, pulse 99, temperature 98.3 F (36.8 C), temperature source Oral, resp. rate 16, height 5\' 1"  (1.549 m), weight 142 lb (64.411 kg), SpO2 100 %. A litle drowsy just back from procedures but rouses easily to voice and seems to follow conversation well. Respirations not labored supine on RA. PERRL, not icteric. Lungs without wheezes or rales. PAC on right anterior chest accessed, no unusual bruising or swelling in that area. Heart RRR. Abdomen still full right lower but minimal distension now. A few BS. LE no edema, cords, tenderness. Moves all extremities, no focal neuro changes. Skin without rash or ecchymoses.  Intake/Output from previous day: 01/30 0701 - 01/31 0700 In: 1282 [P.O.:1282] Out: -  Intake/Output this shift:   Lab Results:  Recent Labs  01/01/16 0430 01/02/16 0413  WBC 12.6* 11.8*  HGB 9.0* 8.3*  HCT 25.9* 24.5*  PLT 346 319   BMET  Recent Labs  01/01/16 0430 01/02/16 0413  NA 122* 122*  K 4.4 4.4  CL 97* 98*  CO2 17* 18*  GLUCOSE 176* 165*  BUN 11 11  CREATININE 1.30* 1.36*  CALCIUM 7.4* 7.3*    Studies/Results: US Paracentesis  01/02/2016  INDICATION: Patient with history of grade 3 endometrial carcinoma, recurrent  malignant ascites. Request is made for therapeutic paracentesis. EXAM: ULTRASOUND GUIDED THERAPEUTIC PARACENTESIS MEDICATIONS: None. COMPLICATIONS: None immediate. PROCEDURE: Informed written consent was obtained from the patient after a discussion of the risks, benefits and alternatives to treatment. A timeout was performed prior to the initiation of the procedure. Initial ultrasound scanning demonstrates a moderate amount of ascites within the left mid to lower abdominal quadrant. The left mid to lower abdomen was prepped and draped in the usual sterile fashion. 1% lidocaine was used for local anesthesia. Following this, a Yueh catheter was introduced. An ultrasound image was saved for documentation purposes. The paracentesis was performed. The catheter was removed and a dressing was applied. The patient tolerated the procedure well without immediate post procedural complication. FINDINGS: A total of approximately 3.5 liters of bloody fluid was removed. IMPRESSION: Successful ultrasound-guided therapeutic paracentesis yielding 3.5 liters of peritoneal fluid. Read by: Rowe Robert, PA-C Electronically Signed   By: Lucrezia Europe M.D.   On: 01/02/2016 12:15    DISCUSSION Results of echocardiogram and paracentesis, as well as PAC placement reviewed with patient and daughter. I have explained that adriamycin may be slightly more effective overall than doxil, tho adria may have more nausea, hair loss and cardiotoxicity. They still need full teaching, but seem to prefer starting with adriamycin. I have entered outpatient chemotherapy orders and notified preauthorization staff; have also requested neulasta due to prior cytopenias. Hopefully will begin chemo on either 2-3 if preauth allows and if space in infusion, otherwise hopefully 01-08-16. I will see her 01-11-16 at office.  WIll restart IV NS via PAC now, which I would prefer is continued until time of DC, which I expect likely in next 24 hrs. She needs to avoid  diuretic at home.  They understand that they can call if any concerns prior to scheduled appointments at cancer center. Message to [facility] RNs for antiemetics and EMLA; they will follow up for full adriamycin teaching if not able to accomplish this prior to DC. Requested written information for adriamycin to patient and family inpatient.   Assessment/Plan: 1.grade 3 serous/ endometrioid endometrial carcinoma: rapidly symptomatic recurrence with malignant ascites 6 months from completion of adjuvant therapy for IIIA disease. The malignant ascites will need to be managed with prn paracenteses until/ unless systemic treatment can improve this. Dehydration due to third spacing fluid, and associated renal complications, will be ongoing problem until improvement in the malignant process. I have DCd oral fluid restriction and encouraged gatorade. Would resume IV NS at ~ 100 cc/ hr while in hospital.  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 Needs to push good nutrition with protein due to losses with ascites.   Please page me if I can help between my rounds [place]

## 2016-01-02 NOTE — Plan of Care (Signed)
Problem: Food- and Nutrition-Related Knowledge Deficit (NB-1.1) Goal: Nutrition education Formal process to instruct or train a patient/client in a skill or to impart knowledge to help patients/clients voluntarily manage or modify food choices and eating behavior to maintain or improve health. Outcome: Completed/Met Date Met:  01/02/16  RD consulted for nutrition education regarding diabetes. Pt follows a vegetarian diet, primary protein source is lentils.    Lab Results  Component Value Date    HGBA1C 6.6* 12/31/2015    RD provided "Carbohydrate Counting for Vegetarians with Diabetes" handout from the Academy of Nutrition and Dietetics and "Vegetarian Protein foods list". Discussed different food groups and their effects on blood sugar, emphasizing carbohydrate-containing foods. Provided list of carbohydrates and recommended serving sizes of common foods. Discussed vegetarian protein sources, encouraged consumption of dairy products. Pt tolerates dairy with no issues. Encouraged pt to limit lentils/beans as they may cause GI distress. Provided examples of protein powder brands that are appropriate for diabetes.  Discussed importance of controlled and consistent carbohydrate intake throughout the day.Teach back method used.  Expect good compliance with family support. Family was present during education and took notes. Encouraged pt to follow-up with [place] RD.  Body mass index is 26.84 kg/(m^2). Pt meets criteria for overweight based on current BMI.  Current diet order is soft, patient is consuming approximately 100% of meals at this time. Labs and medications reviewed. No further nutrition interventions warranted at this time. If additional nutrition issues arise, please re-consult RD.  Clayton Bibles, MS, RD, LDN Pager: [phone] After Hours Pager: [phone]

## 2016-01-02 NOTE — Care Management Note (Signed)
Case Management Note  Patient Details  Name: Brittany Schwartz MRN: [id number] Date of Birth: July 18, 1951  Subjective/Objective:         65 yo admitted with Metastatic CA           Action/Plan: From home with spouse  Expected Discharge Date:   (unknown)               Expected Discharge Plan:  Home/Self Care  In-House Referral:     Discharge planning Services  CM Consult  Post Acute Care Choice:    Choice offered to:     DME Arranged:    DME Agency:     HH Arranged:    [place] Agency:     Status of Service:  Completed, signed off  Medicare Important Message Given:    Date Medicare IM Given:    Medicare IM give by:    Date Additional Medicare IM Given:    Additional Medicare Important Message give by:     If discussed at [place] of Stay Meetings, dates discussed:    Additional Comments: Chart reviewed and no CM needs identified or communicated at this time. CM will continue to follow. Marney Doctor RN,BSN,NCM [id number] Lynnell Catalan, RN 01/02/2016, 3:33 PM

## 2016-01-03 ENCOUNTER — Other Ambulatory Visit: Payer: Self-pay | Admitting: Oncology

## 2016-01-03 DIAGNOSIS — E871 Hypo-osmolality and hyponatremia: Secondary | ICD-10-CM

## 2016-01-03 DIAGNOSIS — D500 Iron deficiency anemia secondary to blood loss (chronic): Secondary | ICD-10-CM

## 2016-01-03 DIAGNOSIS — N289 Disorder of kidney and ureter, unspecified: Secondary | ICD-10-CM

## 2016-01-03 DIAGNOSIS — R180 Malignant ascites: Secondary | ICD-10-CM

## 2016-01-03 DIAGNOSIS — E860 Dehydration: Secondary | ICD-10-CM

## 2016-01-03 LAB — CBC
HEMATOCRIT: 22.3 % — AB (ref 36.0–46.0)
HEMOGLOBIN: 7.7 g/dL — AB (ref 12.0–15.0)
MCH: 30.1 pg (ref 26.0–34.0)
MCHC: 34.5 g/dL (ref 30.0–36.0)
MCV: 87.1 fL (ref 78.0–100.0)
Platelets: 287 10*3/uL (ref 150–400)
RBC: 2.56 MIL/uL — ABNORMAL LOW (ref 3.87–5.11)
RDW: 13 % (ref 11.5–15.5)
WBC: 12.6 10*3/uL — ABNORMAL HIGH (ref 4.0–10.5)

## 2016-01-03 LAB — GLUCOSE, CAPILLARY
GLUCOSE-CAPILLARY: 165 mg/dL — AB (ref 65–99)
GLUCOSE-CAPILLARY: 268 mg/dL — AB (ref 65–99)
GLUCOSE-CAPILLARY: 167 mg/dL — AB (ref 65–99)
Glucose-Capillary: 138 mg/dL — ABNORMAL HIGH (ref 65–99)

## 2016-01-03 LAB — PREPARE RBC (CROSSMATCH)

## 2016-01-03 LAB — BASIC METABOLIC PANEL
Anion gap: 5 (ref 5–15)
BUN: 13 mg/dL (ref 6–20)
CALCIUM: 6.8 mg/dL — AB (ref 8.9–10.3)
CHLORIDE: 103 mmol/L (ref 101–111)
CO2: 17 mmol/L — AB (ref 22–32)
CREATININE: 1.33 mg/dL — AB (ref 0.44–1.00)
GFR calc Af Amer: 47 mL/min — ABNORMAL LOW (ref >60)
GFR calc non Af Amer: 41 mL/min — ABNORMAL LOW (ref >60)
GLUCOSE: 169 mg/dL — AB (ref 65–99)
Potassium: 4.3 mmol/L (ref 3.5–5.1)
Sodium: 125 mmol/L — ABNORMAL LOW (ref 135–145)

## 2016-01-03 MED ORDER — FERROUS FUMARATE 325 (106 FE) MG PO TABS
1.0000 | ORAL_TABLET | Freq: Every day | ORAL | Status: DC
  Administered 2016-01-03 – 2016-01-04 (×2): 106 mg via ORAL
  Filled 2016-01-03 (×2): qty 1

## 2016-01-03 MED ORDER — PREMIER PROTEIN SHAKE
8.0000 [oz_av] | Freq: Two times a day (BID) | ORAL | Status: DC
  Administered 2016-01-03 – 2016-01-04 (×2): 8 [oz_av] via ORAL
  Filled 2016-01-03 (×3): qty 325.31

## 2016-01-03 MED ORDER — SODIUM CHLORIDE 0.9 % IV SOLN
Freq: Once | INTRAVENOUS | Status: AC
  Administered 2016-01-03: 12:00:00 via INTRAVENOUS

## 2016-01-03 MED ORDER — GLUCERNA SHAKE PO LIQD
237.0000 mL | ORAL | Status: DC
  Filled 2016-01-03: qty 237

## 2016-01-03 MED ORDER — SODIUM CHLORIDE 0.9 % IV SOLN
Freq: Once | INTRAVENOUS | Status: AC
  Administered 2016-01-03: 16:00:00 via INTRAVENOUS

## 2016-01-03 NOTE — Progress Notes (Signed)
Patient Demographics  Brittany Schwartz, is a 65 y.o. female, DOB - [birth year], [id number]  Admit date - 12/29/2015   Admitting Physician Domenic Polite, MD  Outpatient Primary MD for the patient is Becvar,DHAVAL, MD  LOS - 5   Chief Complaint  Patient presents with  . Abdominal Pain       Admission HPI/Brief narrative: 65 y.o. female with past medical history of stage IIIa endometrial cancer, underwent robotic hysterectomy in 2/16, followed by chemotherapy and most recently vaginal brachy therapy, presents with malignant ascites, requiring paracentesis 2,  Subjective:   Brittany Schwartz today has, No headache, No chest pain, No abdominal pain - No Nausea, had BM yesterday, status post paracentesis yesterday.  Assessment & Plan    Active Problems:   Endometrial cancer ([place])   Diabetes mellitus ([place])   Hyponatremia   Type 2 diabetes mellitus without complication (HCC)   Essential hypertension   Metastatic cancer (HCC)   Ascites   Hydronephrosis, left   Abdominal distension   Non-insulin dependent type 2 diabetes mellitus (HCC)   Malignant ascites  Endometrial cancer , metastatic , malignant ascites -Now with extensive new carcinomatosis consistent with metastatic disease and malignant ascites , rising CA125. -s/p ultrasound-guided paparacentesis on 1/28, 1/31. - Will need to continue on when necessary basis for symptomatic relief, plan for repeat thoracentesis in a.m. To discharge - Oncology consult with Dr. Marko Plume greatly appreciated, plan to resume chemotherapy this coming Friday.  Left hydronephrosis  -Secondary to pelvic mass, was discussed with urology Dr. Junious Silk  -Recommend continued observation/supportive care and no need for acute intervention at this time since she is not very symptomatic from this standpoint and kidney function close to normal  -She will need a follow-up  renal ultrasound and follow-up with them as outpatient  -Monitor urine output, IV fluids for now, hold ACE    Hyponatremia -Suspect secondary to diuretic (chlorthalidone) and possibly component of volume depletion and dehydration -continue hydrate, hold chlorthalidone -urine sodium 40and osmolarity 500 -asymptomatic,  - continue salt tabs, encourage beverages containing salt, restarted ivf after paracentesis. - Patient with leakage from paracentesis site, actually this will help to prevent rapid reaccumulation for now.  Mild AKI -hold ACE, hydrate and monitor, doubt Hydronephrosis contributing much, ua unremarkable, renal dosing meds. Started sodium bicarb due to mild metabolic acidosis.  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 -  Anemia has been worsened by blood loss from large volume ascites/paracentesis.   Diabetes mellitus (noninsulin dependent) -Hold metformin, glipizide , -on sliding scale insulin , a1c 6.6  Essential hypertension -Continue atenolol, hold chlorthalidone   Code Status: Full  Family Communication: Discussed with daughter via phone  Disposition Plan: Home once stable   Procedures  Paracentesis 1/28, 1/31 2 units PRBC transfusion planned for today   Consults   Oncology   Medications  Scheduled Meds: . sodium chloride   Intravenous Once  . ferrous fumarate  1 tablet Oral Daily  . insulin aspart  0-15 Units Subcutaneous TID WC  . latanoprost  1 drop Both Eyes QHS  . pantoprazole  40 mg  Oral Daily  . pravastatin  20 mg Oral Daily  . sodium chloride  1 g Oral TID WC   Continuous Infusions:  PRN Meds:.acetaminophen **OR** acetaminophen, HYDROmorphone (DILAUDID) injection, iohexol, ondansetron **OR** ondansetron (ZOFRAN) IV  DVT Prophylaxis   SCDs   Lab  Results  Component Value Date   PLT 287 01/03/2016    Antibiotics    Anti-infectives    Start     Dose/Rate Route Frequency Ordered Stop   01/02/16 1102  ceFAZolin (ANCEF) 2-3 GM-% IVPB SOLR    Comments:  Hunt, Rebecca   : cabinet override      01/02/16 1102 01/02/16 1148   01/02/16 1100  ceFAZolin (ANCEF) IVPB 2 g/50 mL premix     2 g 100 mL/hr over 30 Minutes Intravenous To Radiology 01/02/16 1012 01/02/16 1149          Objective:   Filed Vitals:   01/02/16 1250 01/02/16 1430 01/02/16 2100 01/03/16 0528  BP: 127/56 108/61 127/64 102/60  Pulse: 99 100 119 100  Temp: 98.3 F (36.8 C) 98.5 F (36.9 C) 98.3 F (36.8 C) 98 F (36.7 C)  TempSrc: Oral Oral Oral Oral  Resp: 16 18 16 16   Height:      Weight:      SpO2: 100% 100% 100% 100%    Wt Readings from Last 3 Encounters:  12/29/15 64.411 kg (142 lb)  11/20/15 58.469 kg (128 lb 14.4 oz)  08/17/15 55.702 kg (122 lb 12.8 oz)     Intake/Output Summary (Last 24 hours) at 01/03/16 1131 Last data filed at 01/03/16 1021  Gross per 24 hour  Intake    120 ml  Output      0 ml  Net    120 ml     Physical Exam  Awake Alert, Oriented X 3, No new F.N deficits, Normal affect [place].AT,PERRAL Supple Neck,No JVD,  Symmetrical Chest wall movement, Good air movement bilaterally RRR,No Gallops,Rubs or new Murmurs, No Parasternal Heave +ve B.Sounds, distended with ascites , with oozing and soaked gauze of  pinkish serous fluid at site of paracentesis yesterday, No rebound - guarding or rigidity. No Cyanosis, Clubbing or edema, No new Rash or bruise    Data Review   Micro Results No results found for this or any previous visit (from the past 240 hour(s)).  Radiology Reports Ct Abdomen Pelvis W Contrast  12/29/2015  CLINICAL DATA:  Bilateral upper abdominal pain for a week. History of endometrial cancer. The patient has completed chemotherapy and radiation therapy. Initial encounter. EXAM: CT ABDOMEN AND PELVIS WITH  CONTRAST TECHNIQUE: Multidetector CT imaging of the abdomen and pelvis was performed using the standard protocol following bolus administration of intravenous contrast. CONTRAST:  80 mL OMNIPAQUE IOHEXOL 300 MG/ML  SOLN COMPARISON:  CT abdomen and pelvis 07/07/2015 FINDINGS: Mild dependent atelectasis is seen in the lung bases. Heart size is mildly enlarged. There is a trace right pleural effusion. No pericardial effusion or left pleural effusion. The patient has a new moderate to large volume of abdominal and pelvic ascites. Extensive omental caking is new since the prior examination and appears worst in the right mid abdomen. More masslike omental lesion is seen at the level of the right iliac wing and measures 6.1 cm AP by 3.3 cm transverse on image 62. Pelvic mass lesions are also identified. In the left hemipelvis, a lesion measuring approximately 6.3 cm AP by 2.9 cm transverse is seen on image 78. The liver, gallbladder, spleen, adrenal glands,  pancreas and right kidney appear normal. There is moderate left hydronephrosis with delayed excretion of contrast from the left kidney. 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 The patient is status post hysterectomy. No focal bony abnormality is identified. IMPRESSION: New extensive peritoneal carcinomatosis consistent with metastatic endometrial carcinoma with associated moderate to large volume of ascites, extensive omental caking and masses. Mass in the left pelvis results and obstruction of the left ureter and moderate hydronephrosis. These results were called by telephone at the time of interpretation on 12/29/2015 at 2:58 pm to Dr. Dalia Heading , who verbally acknowledged these results. Electronically Signed   By: Inge Rise M.D.   On: 12/29/2015 15:00   US Paracentesis  01/02/2016  INDICATION: Patient with history of grade 3 endometrial carcinoma, recurrent malignant ascites. Request is made for  therapeutic paracentesis. EXAM: ULTRASOUND GUIDED THERAPEUTIC PARACENTESIS MEDICATIONS: None. COMPLICATIONS: None immediate. PROCEDURE: Informed written consent was obtained from the patient after a discussion of the risks, benefits and alternatives to treatment. A timeout was performed prior to the initiation of the procedure. Initial ultrasound scanning demonstrates a moderate amount of ascites within the left mid to lower abdominal quadrant. The left mid to lower abdomen was prepped and draped in the usual sterile fashion. 1% lidocaine was used for local anesthesia. Following this, a Yueh catheter was introduced. An ultrasound image was saved for documentation purposes. The paracentesis was performed. The catheter was removed and a dressing was applied. The patient tolerated the procedure well without immediate post procedural complication. FINDINGS: A total of approximately 3.5 liters of bloody fluid was removed. IMPRESSION: Successful ultrasound-guided therapeutic paracentesis yielding 3.5 liters of peritoneal fluid. Read by: Rowe Robert, PA-C Electronically Signed   By: Lucrezia Europe M.D.   On: 01/02/2016 12:15   US Paracentesis  12/30/2015  [name], PA-C     12/30/2015  1:26 PM Successful US guided paracentesis from RUQ. Yielded 4L of bloody ascitic fluid. No immediate complications. Pt tolerated well. Specimen was sent for labs. [name] PA-C 12/30/2015 1:07 PM   Ir Cyndy Freeze Guide Cv Line Right  01/02/2016  CLINICAL DATA:  Endometrial carcinoma, needs long-term venous access for chemotherapy regimen. EXAM: TUNNELED PORT CATHETER PLACEMENT WITH ULTRASOUND AND FLUOROSCOPIC GUIDANCE FLUOROSCOPY TIME:  0.3 minutes, 41 uGym2 DAP ANESTHESIA/SEDATION: Intravenous Fentanyl and Versed were administered as conscious sedation during continuous monitoring of the patient's level of consciousness and physiological / cardiorespiratory status by the radiology RN, with a total moderate sedation time of 14  minutes. TECHNIQUE: The procedure, risksbenefits, and alternatives were explained to the patient. Questions regarding the procedure were encouraged and answered. The patient understands and consents to the procedure. As antibiotic prophylaxis, cefazolin 2 g IV was ordered pre-procedure and administered intravenously within one hour of incision., Patency of the right IJ vein was confirmed with ultrasound with image documentation. An appropriate skin site was determined. Skin site was marked. Region was prepped using maximum barrier technique including cap and mask, sterile gown, sterile gloves, large sterile sheet, and Chlorhexidine as cutaneous antisepsis. The region was infiltrated locally with 1% lidocaine. Under real-time ultrasound guidance, the right IJ vein was accessed with a 21 gauge micropuncture needle; the needle tip within the vein was confirmed with ultrasound image documentation. Needle was exchanged over a 018 guidewire for transitional dilator which allowed passage of the [facility] wire into the IVC. Over this, the transitional dilator was exchanged for a 5 [place] MPA catheter. A small incision  was made on the right anterior chest wall and a subcutaneous pocket fashioned. The power-injectable port was positioned and its catheter tunneled to the right IJ dermatotomy site. The MPA catheter was exchanged over an Amplatz wire for a peel-away sheath, through which the port catheter, which had been trimmed to the appropriate length, was advanced and positioned under fluoroscopy with its tip at the cavoatrial junction. Spot chest radiograph confirms good catheter position and no pneumothorax. The pocket was closed with deep interrupted and subcuticular continuous 3-0 Monocryl sutures. The port was flushed per protocol. The incisions were covered with Dermabond then covered with a sterile dressing. COMPLICATIONS: COMPLICATIONS None immediate IMPRESSION: Technically successful right IJ power-injectable port  catheter placement. Ready for routine use. Electronically Signed   By: Lucrezia Europe M.D.   On: 01/02/2016 14:23   Ir US Guide Vasc Access Right  01/02/2016  CLINICAL DATA:  Endometrial carcinoma, needs long-term venous access for chemotherapy regimen. EXAM: TUNNELED PORT CATHETER PLACEMENT WITH ULTRASOUND AND FLUOROSCOPIC GUIDANCE FLUOROSCOPY TIME:  0.3 minutes, 41 uGym2 DAP ANESTHESIA/SEDATION: Intravenous Fentanyl and Versed were administered as conscious sedation during continuous monitoring of the patient's level of consciousness and physiological / cardiorespiratory status by the radiology RN, with a total moderate sedation time of 14 minutes. TECHNIQUE: The procedure, risksbenefits, and alternatives were explained to the patient. Questions regarding the procedure were encouraged and answered. The patient understands and consents to the procedure. As antibiotic prophylaxis, cefazolin 2 g IV was ordered pre-procedure and administered intravenously within one hour of incision., Patency of the right IJ vein was confirmed with ultrasound with image documentation. An appropriate skin site was determined. Skin site was marked. Region was prepped using maximum barrier technique including cap and mask, sterile gown, sterile gloves, large sterile sheet, and Chlorhexidine as cutaneous antisepsis. The region was infiltrated locally with 1% lidocaine. Under real-time ultrasound guidance, the right IJ vein was accessed with a 21 gauge micropuncture needle; the needle tip within the vein was confirmed with ultrasound image documentation. Needle was exchanged over a 018 guidewire for transitional dilator which allowed passage of the [facility] wire into the IVC. Over this, the transitional dilator was exchanged for a 5 [place] MPA catheter. A small incision was made on the right anterior chest wall and a subcutaneous pocket fashioned. The power-injectable port was positioned and its catheter tunneled to the right IJ dermatotomy  site. The MPA catheter was exchanged over an Amplatz wire for a peel-away sheath, through which the port catheter, which had been trimmed to the appropriate length, was advanced and positioned under fluoroscopy with its tip at the cavoatrial junction. Spot chest radiograph confirms good catheter position and no pneumothorax. The pocket was closed with deep interrupted and subcuticular continuous 3-0 Monocryl sutures. The port was flushed per protocol. The incisions were covered with Dermabond then covered with a sterile dressing. COMPLICATIONS: COMPLICATIONS None immediate IMPRESSION: Technically successful right IJ power-injectable port catheter placement. Ready for routine use. Electronically Signed   By: Lucrezia Europe M.D.   On: 01/02/2016 14:23     CBC  Recent Labs Lab 12/30/15 0430 12/31/15 0405 01/01/16 0430 01/02/16 0413 01/03/16 0440  WBC 9.7 8.9 12.6* 11.8* 12.6*  HGB 8.9* 9.3* 9.0* 8.3* 7.7*  HCT 25.2* 26.3* 25.9* 24.5* 22.3*  PLT 292 310 346 319 287  MCV 86.3 86.5 86.9 86.9 87.1  MCH 30.5 30.6 30.2 29.4 30.1  MCHC 35.3 35.4 34.7 33.9 34.5  RDW 12.2 12.4 12.6 12.6 13.0    Chemistries  Recent Labs Lab 12/29/15 1242 12/30/15 0430 12/31/15 0405 01/01/16 0430 01/02/16 0413 01/03/16 0440  NA 118* 118* 119* 122* 122* 125*  K 4.9 5.0 4.6 4.4 4.4 4.3  CL 86* 89* 94* 97* 98* 103  CO2 21* 18* 18* 17* 18* 17*  GLUCOSE 178* 135* 144* 176* 165* 169*  BUN 16 17 14 11 11 13   CREATININE 1.35* 1.34* 1.33* 1.30* 1.36* 1.33*  CALCIUM 9.0 8.2* 7.4* 7.4* 7.3* 6.8*  AST 21 18  --   --   --   --   ALT 14 12*  --   --   --   --   ALKPHOS 44 36*  --   --   --   --   BILITOT 0.5 0.6  --   --   --   --    ------------------------------------------------------------------------------------------------------------------ estimated creatinine clearance is 36.2 mL/min (by C-G formula based on Cr of  1.33). ------------------------------------------------------------------------------------------------------------------ No results for input(s): HGBA1C in the last 72 hours. ------------------------------------------------------------------------------------------------------------------ No results for input(s): CHOL, HDL, LDLCALC, TRIG, CHOLHDL, LDLDIRECT in the last 72 hours. ------------------------------------------------------------------------------------------------------------------ No results for input(s): TSH, T4TOTAL, T3FREE, THYROIDAB in the last 72 hours.  Invalid input(s): FREET3 ------------------------------------------------------------------------------------------------------------------ No results for input(s): VITAMINB12, FOLATE, FERRITIN, TIBC, IRON, RETICCTPCT in the last 72 hours.  Coagulation profile  Recent Labs Lab 01/02/16 0413  INR 1.35    No results for input(s): DDIMER in the last 72 hours.  Cardiac Enzymes No results for input(s): CKMB, TROPONINI, MYOGLOBIN in the last 168 hours.  Invalid input(s): CK ------------------------------------------------------------------------------------------------------------------ Invalid input(s): POCBNP     Time Spent in minutes   30 minutes   Kailoni Vahle M.D on 01/03/2016 at 11:31 AM  Between 7am to 7pm - Pager - [phone]  After 7pm go to [email or web address] - password [facility]  [facility]   Office  [phone]

## 2016-01-03 NOTE — Progress Notes (Signed)
Medical Oncology January 03, 2016, 10:59 AM  Hospital day 5 Antibiotics: none Chemotherapy: adjuvant carbo taxol completed 06-16-15. Plan first adriamycin (at [facility] if out of hosptial) on 01-05-16.  Outpatient physicians: [name]/ [name]; [name], MD (PCP, [facility]), Aloha Gell  EMR reviewed, patient seen with family member here. 1 unit PRBCs planned.  Subjective: Slept well last PM. A little SOB when up to BR today, no overt bleeding tho malignant ascites has been bloody. Good BM today. No pain abdomen or at new PAC, which has been infusing without difficulty. Leaking fluid from tract of last paracentesis especially when got up to BR, dressing changed again just prior to my visit. Has not tried protein supplement drink, dietician did see, discussed need for increased protein with malignant ascites losses.   Her other daughter is coming from [place].  ONCOLOGIC HISTORY  Patient presented to Dr [name] 01-03-15 with vaginal spotting x 1 year. Pelvic US showed 33 mm endometrial stripe with ovaries normal, and endometrial biopsy also 01-03-15 reportedly had grade 3 endometrial adenocarcinoma. She was seen in consultation by Dr Denman George on 2-8-1. CT CAP 01-13-15 had negative chest, no adenopathy, mild fatty liver, markedly thickened endometrium and no evidence of metastatic disease outside of uterus. CA 125 preoperatively on 01-13-15 was 22.9. Surgery by Dr [name] 01-19-15 was robotic hysterectomy, BSO and resection of nodule at sigmoid serosa. At surgery there was milial tumor studding with 5-10 mm implants on bladder peritoneum and miliary disease on pelvic sidewalls, without upper abdominal disease evident, and omentum appeared normal. 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 She was seen for post operative follow up by Dr Denman George  on 02-03-15, with recommendation for 6 cycles of taxol carboplatin, then consideration of external beam RT + vaginal brachytherapy if CR. First carboplatin taxol was given 02-16-2015; she was neutropenic with ANC 0.7 by day 15 cycle 1. She was transfused 2 units PRBCs 5-19/5-20-16 for Hgb 7.8. Cycles 4 and 5 were both delayed due to thrombocytopenia. She completed chemotherapy 06-16-15 and vaginal brachytherapy30 gray in 5 fractions as of 09/14/2015   Objective: Vital signs in last 24 hours: Blood pressure 102/60, pulse 100, temperature 98 F (36.7 C), temperature source Oral, resp. rate 16, height 5\' 1"  (1.549 m), weight 142 lb (64.411 kg), SpO2 100 %. Awake, alert, does not appear in any discomfort supine in bed on RA. Very pleasant as always, seems to follow conversation well, as does family member. Lungs without wheezes or rales. PAC dressing intact, no ecchymosis obvious, infusing easily. Heart RRR. Abdomen more full again than just after paracentesis on 01-01-16, quiet, not tender. Dressing on left dry now. LE trace pedal edema, warm. Moves all extremities easily.   IVF NS which has been at 100/ hr, rate decreased now to 50 cc/hr.   Lab Results:  Recent Labs  01/02/16 0413 01/03/16 0440  WBC 11.8* 12.6*  HGB 8.3* 7.7*  HCT 24.5* 22.3*  PLT 319 287   BMET  Recent Labs  01/02/16 0413 01/03/16 0440  NA 122* 125*  K 4.4 4.3  CL 98* 103  CO2 18* 17*  GLUCOSE 165* 169*  BUN 11 13  CREATININE 1.36* 1.33*  CALCIUM 7.3* 6.8*    Studies/Results: US Paracentesis  01/02/2016  INDICATION: Patient with history of grade 3 endometrial carcinoma, recurrent malignant ascites. Request is made for therapeutic paracentesis. EXAM: ULTRASOUND  GUIDED THERAPEUTIC PARACENTESIS MEDICATIONS: None. COMPLICATIONS: None immediate. PROCEDURE: Informed written consent was obtained from the patient after a discussion of the risks, benefits and alternatives to treatment. A timeout was performed prior to the  initiation of the procedure. Initial ultrasound scanning demonstrates a moderate amount of ascites within the left mid to lower abdominal quadrant. The left mid to lower abdomen was prepped and draped in the usual sterile fashion. 1% lidocaine was used for local anesthesia. Following this, a Yueh catheter was introduced. An ultrasound image was saved for documentation purposes. The paracentesis was performed. The catheter was removed and a dressing was applied. The patient tolerated the procedure well without immediate post procedural complication. FINDINGS: A total of approximately 3.5 liters of bloody fluid was removed. IMPRESSION: Successful ultrasound-guided therapeutic paracentesis yielding 3.5 liters of peritoneal fluid. Read by: Rowe Robert, PA-C Electronically Signed   By: Lucrezia Europe M.D.   On: 01/02/2016 12:15   Ir Fluoro Guide Cv Line Right  01/02/2016  CLINICAL DATA:  Endometrial carcinoma, needs long-term venous access for chemotherapy regimen. EXAM: TUNNELED PORT CATHETER PLACEMENT WITH ULTRASOUND AND FLUOROSCOPIC GUIDANCE FLUOROSCOPY TIME:  0.3 minutes, 41 uGym2 DAP ANESTHESIA/SEDATION: Intravenous Fentanyl and Versed were administered as conscious sedation during continuous monitoring of the patient's level of consciousness and physiological / cardiorespiratory status by the radiology RN, with a total moderate sedation time of 14 minutes. TECHNIQUE: The procedure, risksbenefits, and alternatives were explained to the patient. Questions regarding the procedure were encouraged and answered. The patient understands and consents to the procedure. As antibiotic prophylaxis, cefazolin 2 g IV was ordered pre-procedure and administered intravenously within one hour of incision., Patency of the right IJ vein was confirmed with ultrasound with image documentation. An appropriate skin site was determined. Skin site was marked. Region was prepped using maximum barrier technique including cap and mask, sterile  gown, sterile gloves, large sterile sheet, and Chlorhexidine as cutaneous antisepsis. The region was infiltrated locally with 1% lidocaine. Under real-time ultrasound guidance, the right IJ vein was accessed with a 21 gauge micropuncture needle; the needle tip within the vein was confirmed with ultrasound image documentation. Needle was exchanged over a 018 guidewire for transitional dilator which allowed passage of the [facility] wire into the IVC. Over this, the transitional dilator was exchanged for a 5 [place] MPA catheter. A small incision was made on the right anterior chest wall and a subcutaneous pocket fashioned. The power-injectable port was positioned and its catheter tunneled to the right IJ dermatotomy site. The MPA catheter was exchanged over an Amplatz wire for a peel-away sheath, through which the port catheter, which had been trimmed to the appropriate length, was advanced and positioned under fluoroscopy with its tip at the cavoatrial junction. Spot chest radiograph confirms good catheter position and no pneumothorax. The pocket was closed with deep interrupted and subcuticular continuous 3-0 Monocryl sutures. The port was flushed per protocol. The incisions were covered with Dermabond then covered with a sterile dressing. COMPLICATIONS: COMPLICATIONS None immediate IMPRESSION: Technically successful right IJ power-injectable port catheter placement. Ready for routine use. Electronically Signed   By: Lucrezia Europe M.D.   On: 01/02/2016 14:23   Ir US Guide Vasc Access Right  01/02/2016  CLINICAL DATA:  Endometrial carcinoma, needs long-term venous access for chemotherapy regimen. EXAM: TUNNELED PORT CATHETER PLACEMENT WITH ULTRASOUND AND FLUOROSCOPIC GUIDANCE FLUOROSCOPY TIME:  0.3 minutes, 41 uGym2 DAP ANESTHESIA/SEDATION: Intravenous Fentanyl and Versed were administered as conscious sedation during continuous monitoring of the patient's level of  consciousness and physiological / cardiorespiratory  status by the radiology RN, with a total moderate sedation time of 14 minutes. TECHNIQUE: The procedure, risksbenefits, and alternatives were explained to the patient. Questions regarding the procedure were encouraged and answered. The patient understands and consents to the procedure. As antibiotic prophylaxis, cefazolin 2 g IV was ordered pre-procedure and administered intravenously within one hour of incision., Patency of the right IJ vein was confirmed with ultrasound with image documentation. An appropriate skin site was determined. Skin site was marked. Region was prepped using maximum barrier technique including cap and mask, sterile gown, sterile gloves, large sterile sheet, and Chlorhexidine as cutaneous antisepsis. The region was infiltrated locally with 1% lidocaine. Under real-time ultrasound guidance, the right IJ vein was accessed with a 21 gauge micropuncture needle; the needle tip within the vein was confirmed with ultrasound image documentation. Needle was exchanged over a 018 guidewire for transitional dilator which allowed passage of the [facility] wire into the IVC. Over this, the transitional dilator was exchanged for a 5 [place] MPA catheter. A small incision was made on the right anterior chest wall and a subcutaneous pocket fashioned. The power-injectable port was positioned and its catheter tunneled to the right IJ dermatotomy site. The MPA catheter was exchanged over an Amplatz wire for a peel-away sheath, through which the port catheter, which had been trimmed to the appropriate length, was advanced and positioned under fluoroscopy with its tip at the cavoatrial junction. Spot chest radiograph confirms good catheter position and no pneumothorax. The pocket was closed with deep interrupted and subcuticular continuous 3-0 Monocryl sutures. The port was flushed per protocol. The incisions were covered with Dermabond then covered with a sterile dressing. COMPLICATIONS: COMPLICATIONS None immediate  IMPRESSION: Technically successful right IJ power-injectable port catheter placement. Ready for routine use. Electronically Signed   By: Lucrezia Europe M.D.   On: 01/02/2016 14:23     Discussion:   [facility] RN did teaching at hospital for adriamycin on 01-01-16 with daughter present; antiemetics and EMLA sent to pharmacy.  Explained that [place] paracenteses can be done outpatient on prn basis, tho can take 1-2 days to get scheduled with IR at times. With increase in ascites since yesterday, I have requested paracenteses outpatient on 2-6 and 2-10. If uncomfortable or tight ascites again prior to DC, would be best to repeat prior to DC so that she does not get into difficulty in first couple of days at home. Discussed use of PAC at [facility] for chemo and for blood draws if needed/ if preferred.   Assessment/Plan: 1.grade 3 serous/ endometrioid endometrial carcinoma: rapidly symptomatic recurrence with malignant ascites 6 months from completion of adjuvant therapy for IIIA disease. The malignant ascites will need to be managed with prn paracenteses until/ unless systemic treatment can improve this. Dehydration due to third spacing fluid, and associated renal complications, will be ongoing problem until improvement in the malignant process. With increase in ascites since yesterday, I have requested paracenteses outpatient on 2-6 and 2-10. If uncomfortable or tight ascites again prior to DC, would be best to repeat prior to DC so that she does not get into difficulty in first couple of days at home. Insurance has approved adriamycin, which can be [facility] at [facility] 01-05-16 if DC by then, otherwise will organize in hospital. 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 Anemia: exacerbated with blood loss from large volume  ascites. Agree with transfusion 1-2 units PRBCs now. Likely will need PRBCs to support initial chemo also. WIll try po iron if tolerates, as ferrous fumarate. B12 normal previously (lifeling vegetarian) 9.PAC in 10.leakage from last paracentesis site: related to volume of ascites, actually is helping with some accumulation now. 11.hyponatremia some better, hypochloremia resolved with additional NS. Fluid restriction in setting of malignant ascites will not significantly improve ascites and will cause systemic dehydration 12.renal insufficiency: stable, urology aware of unilateral hydronephrosis on admission (no formal consult). Follow, try to keep adequately hydrated given third spacing fluids as noted. 13.Good EF by echocardiogram, done prior to adriamycin   Please page me if needed, [phone]. I will follow with you while in hospital Thank you LIVESAY,LENNIS P

## 2016-01-03 NOTE — Progress Notes (Signed)
Initial Nutrition Assessment  INTERVENTION:   -Provide Premier Protein supplements, BID, each provide 160 kcal and 30g of protein -Provide Glucerna Shake po daily, each supplement provides 220 kcal and 10 grams of protein -Encourage PO intake -RD to continue to monitor  NUTRITION DIAGNOSIS:   Increased nutrient needs related to cancer and cancer related treatments as evidenced by estimated needs.  GOAL:   Patient will meet greater than or equal to 90% of their needs  MONITOR:   PO intake, Supplement acceptance, Labs, Weight trends, Skin, I & O's  REASON FOR ASSESSMENT:   Consult Assessment of nutrition requirement/status  ASSESSMENT:   65 y.o. female with past medical history of stage IIIa endometrial cancer, underwent robotic hysterectomy in 2/16, followed by chemotherapy and most recently vaginal brachy therapy, presents with malignant ascites, requiring paracentesis 2,  Reported PO intake: 75%. Pt and family member in room reports pt ate cookies (puff pastry variety) and tea for breakfast. Pt is having her lunch delivered from home, spinach soup. She is waiting to eat lunch before trying a Premier Protein shake. Pt would like to try all flavors and additionally Glucerna shakes to see which supplement she prefers. RD encouraged family to let RD know which supplement she prefers. Per weight history, pt's weight has increased. This is most likely d/t fluid accumulation from ascites. No signs of muscle or fat depletion noted.   Labs reviewed: CBGs: 138-165 Low Na Elevated Creatinine  Diet Order:  DIET SOFT Room service appropriate?: Yes; Fluid consistency:: Thin  Skin:  Reviewed, no issues  Last BM:  1/31  Height:   Ht Readings from Last 1 Encounters:  12/29/15 5\' 1"  (1.549 m)    Weight:   Wt Readings from Last 1 Encounters:  12/29/15 142 lb (64.411 kg)    Ideal Body Weight:  47.7 kg  BMI:  Body mass index is 26.84 kg/(m^2).  Estimated Nutritional Needs:    Kcal:  1800-2000  Protein:  85-95g  Fluid:  2L/day  EDUCATION NEEDS:   Education needs addressed  Clayton Bibles, MS, RD, LDN Pager: [phone] After Hours Pager: [phone]

## 2016-01-03 NOTE — Progress Notes (Signed)
Pt is leaking from her LUQ area where she got her paracentesis. Serosanguineous drainage from the site, moderate amount. Applied dressing to the site. 4x4 guaze and ABD pad folded in half. Changed twice during this shift. Will pass on to day shift nurse to have MD aware of this. Pt and pt daughter concerned. Will continue to monitor

## 2016-01-04 ENCOUNTER — Encounter (HOSPITAL_COMMUNITY): Payer: Self-pay | Admitting: Emergency Medicine

## 2016-01-04 ENCOUNTER — Observation Stay (HOSPITAL_COMMUNITY)
Admission: EM | Admit: 2016-01-04 | Discharge: 2016-01-07 | Disposition: A | Discharge status: Home / Self Care | Payer: BLUE CROSS/BLUE SHIELD | Attending: Internal Medicine | Admitting: Internal Medicine

## 2016-01-04 ENCOUNTER — Inpatient Hospital Stay (HOSPITAL_COMMUNITY): Payer: BLUE CROSS/BLUE SHIELD

## 2016-01-04 DIAGNOSIS — C541 Malignant neoplasm of endometrium: Secondary | ICD-10-CM | POA: Diagnosis present

## 2016-01-04 DIAGNOSIS — R7989 Other specified abnormal findings of blood chemistry: Secondary | ICD-10-CM | POA: Diagnosis present

## 2016-01-04 DIAGNOSIS — D6481 Anemia due to antineoplastic chemotherapy: Secondary | ICD-10-CM | POA: Diagnosis present

## 2016-01-04 DIAGNOSIS — T451X5A Adverse effect of antineoplastic and immunosuppressive drugs, initial encounter: Secondary | ICD-10-CM

## 2016-01-04 DIAGNOSIS — N179 Acute kidney failure, unspecified: Secondary | ICD-10-CM | POA: Diagnosis present

## 2016-01-04 DIAGNOSIS — R180 Malignant ascites: Secondary | ICD-10-CM | POA: Diagnosis present

## 2016-01-04 DIAGNOSIS — R188 Other ascites: Secondary | ICD-10-CM

## 2016-01-04 DIAGNOSIS — E871 Hypo-osmolality and hyponatremia: Secondary | ICD-10-CM | POA: Diagnosis not present

## 2016-01-04 DIAGNOSIS — I10 Essential (primary) hypertension: Secondary | ICD-10-CM | POA: Diagnosis present

## 2016-01-04 DIAGNOSIS — Z8542 Personal history of malignant neoplasm of other parts of uterus: Secondary | ICD-10-CM | POA: Insufficient documentation

## 2016-01-04 DIAGNOSIS — R2240 Localized swelling, mass and lump, unspecified lower limb: Secondary | ICD-10-CM | POA: Insufficient documentation

## 2016-01-04 DIAGNOSIS — D509 Iron deficiency anemia, unspecified: Secondary | ICD-10-CM

## 2016-01-04 DIAGNOSIS — E119 Type 2 diabetes mellitus without complications: Secondary | ICD-10-CM

## 2016-01-04 DIAGNOSIS — Z79899 Other long term (current) drug therapy: Secondary | ICD-10-CM | POA: Insufficient documentation

## 2016-01-04 DIAGNOSIS — C55 Malignant neoplasm of uterus, part unspecified: Secondary | ICD-10-CM

## 2016-01-04 DIAGNOSIS — H409 Unspecified glaucoma: Secondary | ICD-10-CM | POA: Insufficient documentation

## 2016-01-04 LAB — URINALYSIS W MICROSCOPIC (NOT AT ARMC)
GLUCOSE, UA: NEGATIVE mg/dL
Hgb urine dipstick: NEGATIVE
KETONES UR: NEGATIVE mg/dL
Leukocytes, UA: NEGATIVE
Nitrite: NEGATIVE
PROTEIN: 100 mg/dL — AB
Specific Gravity, Urine: 1.03 (ref 1.005–1.030)
pH: 6 (ref 5.0–8.0)

## 2016-01-04 LAB — COMPREHENSIVE METABOLIC PANEL
ALBUMIN: 1.9 g/dL — AB (ref 3.5–5.0)
ALK PHOS: 37 U/L — AB (ref 38–126)
ALT: 9 U/L — ABNORMAL LOW (ref 14–54)
ANION GAP: 6 (ref 5–15)
AST: 17 U/L (ref 15–41)
BUN: 19 mg/dL (ref 6–20)
CALCIUM: 7.3 mg/dL — AB (ref 8.9–10.3)
CHLORIDE: 98 mmol/L — AB (ref 101–111)
CO2: 19 mmol/L — AB (ref 22–32)
Creatinine, Ser: 1.4 mg/dL — ABNORMAL HIGH (ref 0.44–1.00)
GFR calc non Af Amer: 38 mL/min — ABNORMAL LOW (ref >60)
GFR, EST AFRICAN AMERICAN: 45 mL/min — AB (ref >60)
GLUCOSE: 177 mg/dL — AB (ref 65–99)
Potassium: 4.3 mmol/L (ref 3.5–5.1)
SODIUM: 123 mmol/L — AB (ref 135–145)
Total Bilirubin: 0.7 mg/dL (ref 0.3–1.2)
Total Protein: 4.2 g/dL — ABNORMAL LOW (ref 6.5–8.1)

## 2016-01-04 LAB — GLUCOSE, CAPILLARY
GLUCOSE-CAPILLARY: 240 mg/dL — AB (ref 65–99)
Glucose-Capillary: 164 mg/dL — ABNORMAL HIGH (ref 65–99)

## 2016-01-04 LAB — TYPE AND SCREEN
ABO/RH(D): B POS
ANTIBODY SCREEN: NEGATIVE
UNIT DIVISION: 0
Unit division: 0

## 2016-01-04 LAB — HEMOGLOBIN AND HEMATOCRIT, BLOOD
HCT: 33 % — ABNORMAL LOW (ref 36.0–46.0)
Hemoglobin: 11.6 g/dL — ABNORMAL LOW (ref 12.0–15.0)

## 2016-01-04 LAB — CBC WITH DIFFERENTIAL/PLATELET
Basophils Absolute: 0 10*3/uL (ref 0.0–0.1)
Basophils Relative: 0 %
EOS ABS: 0.1 10*3/uL (ref 0.0–0.7)
Eosinophils Relative: 0 %
HEMATOCRIT: 32.7 % — AB (ref 36.0–46.0)
HEMOGLOBIN: 11.6 g/dL — AB (ref 12.0–15.0)
LYMPHS ABS: 1.3 10*3/uL (ref 0.7–4.0)
LYMPHS PCT: 9 %
MCH: 30.3 pg (ref 26.0–34.0)
MCHC: 35.5 g/dL (ref 30.0–36.0)
MCV: 85.4 fL (ref 78.0–100.0)
MONOS PCT: 12 %
Monocytes Absolute: 1.8 10*3/uL — ABNORMAL HIGH (ref 0.1–1.0)
NEUTROS ABS: 11.7 10*3/uL — AB (ref 1.7–7.7)
NEUTROS PCT: 79 %
PLATELETS: 215 10*3/uL (ref 150–400)
RBC: 3.83 MIL/uL — AB (ref 3.87–5.11)
RDW: 13.1 % (ref 11.5–15.5)
WBC: 14.9 10*3/uL — AB (ref 4.0–10.5)

## 2016-01-04 LAB — I-STAT CG4 LACTIC ACID, ED: Lactic Acid, Venous: 2.05 mmol/L (ref 0.5–2.0)

## 2016-01-04 LAB — ETHANOL: Alcohol, Ethyl (B): <5 mg/dL (ref <5)

## 2016-01-04 MED ORDER — PREMIER PROTEIN SHAKE: 8.0000 [oz_av] | Freq: Two times a day (BID) | ORAL | Status: DC

## 2016-01-04 MED ORDER — OXYCODONE HCL 5 MG PO TABS: 5.0000 mg | ORAL_TABLET | Freq: Four times a day (QID) | ORAL | Status: DC | PRN

## 2016-01-04 MED ORDER — SODIUM CHLORIDE 0.9 % IV SOLN
INTRAVENOUS | Status: DC
  Administered 2016-01-05: 05:00:00 via INTRAVENOUS

## 2016-01-04 MED ORDER — HEPARIN SOD (PORK) LOCK FLUSH 100 UNIT/ML IV SOLN
500.0000 [IU] | INTRAVENOUS | Status: DC | PRN
  Filled 2016-01-04: qty 5

## 2016-01-04 MED ORDER — GLUCERNA SHAKE PO LIQD: 237.0000 mL | ORAL | Status: DC

## 2016-01-04 MED ORDER — SODIUM CHLORIDE 0.9 % IV BOLUS (SEPSIS)
500.0000 mL | Freq: Once | INTRAVENOUS | Status: AC
  Administered 2016-01-05: 500 mL via INTRAVENOUS

## 2016-01-04 NOTE — ED Notes (Signed)
Pt had about 285ml of urine in her bladder when scan was done

## 2016-01-04 NOTE — Progress Notes (Signed)
MEDICAL ONCOLOGY January 04, 2016, 8:13 AM   EMR reviewed, patient seen with daughter present.   Subjective: Feels better since transfusion, expects DC after paracentesis repeated today. Likes the vanilla protein supplement. PAC just a little uncomfortable from recent placement. Voiding "only a few drops" possibly since IVF DCd, no frank dysuria but some pressure - will check urine specimen, can follow results outpatient  Objective: Vital signs in last 24 hours: Blood pressure 121/67, pulse 95, temperature 98.4 F (36.9 C), temperature source Oral, resp. rate 16, height 5\' 1"  (1.549 m), weight 142 lb (64.411 kg), SpO2 100 %. Awake and alert, up in chair, NAD. Respirations not labored. Lungs clear ant and post. PAC site fine, fairly minimal ecchymosis. Heart RRR. Abdomen distended, not tender, quiet. LE no pitting edema, cords. Feet warm. Moves all extremities  Intake/Output from previous day: 02/01 0701 - 02/02 0700 In: 910 [P.O.:240; Blood:670] Out: -  Intake/Output this shift:   Lab Results:  Recent Labs  01/02/16 0413 01/03/16 0440 01/04/16 0445  WBC 11.8* 12.6*  --   HGB 8.3* 7.7* 11.6*  HCT 24.5* 22.3* 33.0*  PLT 319 287  --    BMET  Recent Labs  01/02/16 0413 01/03/16 0440  NA 122* 125*  K 4.4 4.3  CL 98* 103  CO2 18* 17*  GLUCOSE 165* 169*  BUN 11 13  CREATININE 1.36* 1.33*  CALCIUM 7.3* 6.8*    Studies/Results: US Paracentesis  01/02/2016  INDICATION: Patient with history of grade 3 endometrial carcinoma, recurrent malignant ascites. Request is made for therapeutic paracentesis. EXAM: ULTRASOUND GUIDED THERAPEUTIC PARACENTESIS MEDICATIONS: None. COMPLICATIONS: None immediate. PROCEDURE: Informed written consent was obtained from the patient after a discussion of the risks, benefits and alternatives to treatment. A timeout was performed prior to the initiation of the procedure. Initial ultrasound scanning demonstrates a moderate amount of ascites within the  left mid to lower abdominal quadrant. The left mid to lower abdomen was prepped and draped in the usual sterile fashion. 1% lidocaine was used for local anesthesia. Following this, a Yueh catheter was introduced. An ultrasound image was saved for documentation purposes. The paracentesis was performed. The catheter was removed and a dressing was applied. The patient tolerated the procedure well without immediate post procedural complication. FINDINGS: A total of approximately 3.5 liters of bloody fluid was removed. IMPRESSION: Successful ultrasound-guided therapeutic paracentesis yielding 3.5 liters of peritoneal fluid. Read by: Rowe Robert, PA-C Electronically Signed   By: Lucrezia Europe M.D.   On: 01/02/2016 12:15   Ir Fluoro Guide Cv Line Right  01/02/2016  CLINICAL DATA:  Endometrial carcinoma, needs long-term venous access for chemotherapy regimen. EXAM: TUNNELED PORT CATHETER PLACEMENT WITH ULTRASOUND AND FLUOROSCOPIC GUIDANCE FLUOROSCOPY TIME:  0.3 minutes, 41 uGym2 DAP ANESTHESIA/SEDATION: Intravenous Fentanyl and Versed were administered as conscious sedation during continuous monitoring of the patient's level of consciousness and physiological / cardiorespiratory status by the radiology RN, with a total moderate sedation time of 14 minutes. TECHNIQUE: The procedure, risksbenefits, and alternatives were explained to the patient. Questions regarding the procedure were encouraged and answered. The patient understands and consents to the procedure. As antibiotic prophylaxis, cefazolin 2 g IV was ordered pre-procedure and administered intravenously within one hour of incision., Patency of the right IJ vein was confirmed with ultrasound with image documentation. An appropriate skin site was determined. Skin site was marked. Region was prepped using maximum barrier technique including cap and mask, sterile gown, sterile gloves, large sterile sheet, and Chlorhexidine as cutaneous antisepsis.  The region was  infiltrated locally with 1% lidocaine. Under real-time ultrasound guidance, the right IJ vein was accessed with a 21 gauge micropuncture needle; the needle tip within the vein was confirmed with ultrasound image documentation. Needle was exchanged over a 018 guidewire for transitional dilator which allowed passage of the [facility] wire into the IVC. Over this, the transitional dilator was exchanged for a 5 [place] MPA catheter. A small incision was made on the right anterior chest wall and a subcutaneous pocket fashioned. The power-injectable port was positioned and its catheter tunneled to the right IJ dermatotomy site. The MPA catheter was exchanged over an Amplatz wire for a peel-away sheath, through which the port catheter, which had been trimmed to the appropriate length, was advanced and positioned under fluoroscopy with its tip at the cavoatrial junction. Spot chest radiograph confirms good catheter position and no pneumothorax. The pocket was closed with deep interrupted and subcuticular continuous 3-0 Monocryl sutures. The port was flushed per protocol. The incisions were covered with Dermabond then covered with a sterile dressing. COMPLICATIONS: COMPLICATIONS None immediate IMPRESSION: Technically successful right IJ power-injectable port catheter placement. Ready for routine use. Electronically Signed   By: Lucrezia Europe M.D.   On: 01/02/2016 14:23   Ir US Guide Vasc Access Right  01/02/2016  CLINICAL DATA:  Endometrial carcinoma, needs long-term venous access for chemotherapy regimen. EXAM: TUNNELED PORT CATHETER PLACEMENT WITH ULTRASOUND AND FLUOROSCOPIC GUIDANCE FLUOROSCOPY TIME:  0.3 minutes, 41 uGym2 DAP ANESTHESIA/SEDATION: Intravenous Fentanyl and Versed were administered as conscious sedation during continuous monitoring of the patient's level of consciousness and physiological / cardiorespiratory status by the radiology RN, with a total moderate sedation time of 14 minutes. TECHNIQUE: The procedure,  risksbenefits, and alternatives were explained to the patient. Questions regarding the procedure were encouraged and answered. The patient understands and consents to the procedure. As antibiotic prophylaxis, cefazolin 2 g IV was ordered pre-procedure and administered intravenously within one hour of incision., Patency of the right IJ vein was confirmed with ultrasound with image documentation. An appropriate skin site was determined. Skin site was marked. Region was prepped using maximum barrier technique including cap and mask, sterile gown, sterile gloves, large sterile sheet, and Chlorhexidine as cutaneous antisepsis. The region was infiltrated locally with 1% lidocaine. Under real-time ultrasound guidance, the right IJ vein was accessed with a 21 gauge micropuncture needle; the needle tip within the vein was confirmed with ultrasound image documentation. Needle was exchanged over a 018 guidewire for transitional dilator which allowed passage of the [facility] wire into the IVC. Over this, the transitional dilator was exchanged for a 5 [place] MPA catheter. A small incision was made on the right anterior chest wall and a subcutaneous pocket fashioned. The power-injectable port was positioned and its catheter tunneled to the right IJ dermatotomy site. The MPA catheter was exchanged over an Amplatz wire for a peel-away sheath, through which the port catheter, which had been trimmed to the appropriate length, was advanced and positioned under fluoroscopy with its tip at the cavoatrial junction. Spot chest radiograph confirms good catheter position and no pneumothorax. The pocket was closed with deep interrupted and subcuticular continuous 3-0 Monocryl sutures. The port was flushed per protocol. The incisions were covered with Dermabond then covered with a sterile dressing. COMPLICATIONS: COMPLICATIONS None immediate IMPRESSION: Technically successful right IJ power-injectable port catheter placement. Ready for routine  use. Electronically Signed   By: Lucrezia Europe M.D.   On: 01/02/2016 14:23   DISCUSSION:  Plans for  chemo reviewed, patient and daughter in agreement, verbal consent given.    Assessment/Plan: 1.grade 3 serous/ endometrioid endometrial carcinoma: rapidly symptomatic recurrence with malignant ascites 6 months from completion of adjuvant therapy for IIIA disease. The malignant ascites will need to be managed with prn paracenteses until/ unless systemic treatment can improve this. Dehydration due to third spacing fluid, and associated renal complications, will be ongoing problem until improvement in the malignant process.  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 Anemia: exacerbated with blood loss from large volume ascites. Transfused 2 units PRBCs on 01-03-16. Continue po iron if tolerates, as ferrous fumarate. B12 normal previously (lifeling vegetarian) 9.PAC in 10.leakage from last paracentesis site: related to volume of ascites, less today 11.hyponatremia some better, hypochloremia resolved with additional NS. Fluid restriction in setting of malignant ascites will not significantly improve ascites and will cause systemic dehydration 12.renal insufficiency: stable, urology aware of unilateral hydronephrosis on admission (no formal consult). Follow, try to keep adequately hydrated given third spacing fluids as noted. 13.Good EF by echocardiogram, done prior to adriamycin   Jacarra Bobak P

## 2016-01-04 NOTE — ED Notes (Signed)
Pt was seen on Friday and was admitted  Pt was discharged today  Pt states since she has been home her lower extremities have become very swollen Pt states she has been unable to void  Pt states she had a urine culture done here and she is to follow up with urology  Pt states she had ascites and the drained her abdomen but it was leaking and she had it sealed  Pt is concerned that is why she may be swelling

## 2016-01-04 NOTE — ED Provider Notes (Signed)
CSN: [id number]     Arrival date & time 01/04/16  2000 History   First MD Initiated Contact with Patient 01/04/16 2021     Chief Complaint  Patient presents with  . Leg Swelling  . Urinary Retention   HPI  Patient presents with concern of increasing abdominal discomfort, bilateral lower extremity swelling, and decreased urine output. Patient was discharged yesterday from this facility after presenting here 1 week ago. When patient complained of upper abdominal pain. 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 She is scheduled to initiate chemotherapy tomorrow. Today, she denies substantial pain, does acknowledge some diffuse abdominal discomfort, but is largely concerned about her inability to urinate, and her substantial swelling in both lower extremities, over the past 48 hours. No fever, chills, vomiting, diarrhea.   Past Medical History  Diagnosis Date  . Hypertension   . Glaucoma   . Diabetes mellitus without complication ([place])   . Palpitations   . Muscle cramps   . Postmenopausal vaginal bleeding   . Uterine cancer [facility])    Past Surgical History  Procedure Laterality Date  . Robotic assisted total hysterectomy with bilateral salpingo oopherectomy Bilateral 01/19/2015    Procedure: XI ROBOTIC ASSISTED TOTAL HYSTERECTOMY WITH BILATERAL SALPINGO OOPHORECTOMY;  Surgeon: [name], MD;  Location: [facility] ORS;  Service: Gynecology;  Laterality: Bilateral;   Family History  Problem Relation Age of Onset  . Diabetes Father    Social History  Substance Use Topics  . Smoking status: Never Smoker   . Smokeless tobacco: Never Used  . Alcohol Use: No   OB History    No data available     Review of Systems  Constitutional:       Per HPI, otherwise negative  HENT:       Per HPI, otherwise negative  Respiratory:       Per HPI, otherwise negative  Cardiovascular:       Per HPI, otherwise negative  Gastrointestinal:  Negative for vomiting.  Endocrine:       Negative aside from HPI  Genitourinary:       Neg aside from HPI   Musculoskeletal:       Per HPI, otherwise negative  Skin: Negative.   Neurological: Negative for syncope.      Allergies  Review of patient's allergies indicates no known allergies.  Home Medications   Prior to Admission medications   Medication Sig Start Date End Date Taking? Authorizing Provider  acetaminophen (TYLENOL) 325 MG tablet Take 650 mg by mouth every 6 (six) hours as needed. Reported on 11/20/2015    Historical Provider, MD  feeding supplement, GLUCERNA SHAKE, (GLUCERNA SHAKE) LIQD Take 237 mLs by mouth daily. 01/04/16   Silver Huguenin Elgergawy, MD  ferrous fumarate (HEMOCYTE - 106 MG FE) 325 (106 FE) MG TABS tablet Take 1 tablet daily  on an empty stomach with OJ or vitamin C tablet 09/11/15   [name], MD  gabapentin (NEURONTIN) 100 MG capsule Take 1 capsule (100 mg total) by mouth at bedtime. Take 1 at bedtime for neuropathy Patient not taking: Reported on 12/29/2015 08/14/15   [name], MD  glipiZIDE (GLUCOTROL XL) 5 MG 24 hr tablet Take 5 mg by mouth 2 (two) times daily.    Historical Provider, MD  latanoprost (XALATAN) 0.005 % ophthalmic solution Place 1 drop into both eyes at bedtime.    Historical Provider, MD  lidocaine-prilocaine (EMLA) cream Apply to Porta-Cath 1-2  hrs prior to access as directed. 01/02/16   [name], MD  LORazepam (ATIVAN) 0.5 MG tablet Place 1 tablet under tongue or swallow every 6 hours as needed for nausea. Will make drowsy. 01/02/16   [name], MD  magnesium 30 MG tablet Take 30 mg by mouth daily.    Historical Provider, MD  ondansetron (ZOFRAN) 8 MG tablet Take 1 tablet by mouth every 8 hours as needed for nausea. Will not make drowsy. 01/02/16   [name], MD  oxyCODONE (ROXICODONE) 5 MG immediate release tablet Take 1 tablet (5 mg total) by mouth every 6 (six) hours as needed for severe pain. 01/04/16    Silver Huguenin Elgergawy, MD  pantoprazole (PROTONIX) 40 MG tablet Take 1 tablet (40 mg total) by mouth daily. 11/20/15   Everitt Amber, MD  pravastatin (PRAVACHOL) 20 MG tablet Take 20 mg by mouth daily.    Historical Provider, MD  prochlorperazine (COMPAZINE) 10 MG tablet Take 1 tablet (10 mg total) by mouth every 6 (six) hours as needed for nausea or vomiting. 01/02/16   [name], MD  protein supplement shake (PREMIER PROTEIN) LIQD Take 237 mLs (8 oz total) by mouth 2 (two) times daily between meals. 01/04/16   Silver Huguenin Elgergawy, MD  timolol (BETIMOL) 0.5 % ophthalmic solution Place 1 drop into both eyes 2 (two) times daily.    Historical Provider, MD   BP 153/73 mmHg  Pulse 110  Temp(Src) 97.7 F (36.5 C) (Oral)  Resp 20  SpO2 100% Physical Exam  Constitutional: She is oriented to person, place, and time. She appears well-developed and well-nourished. No distress.  HENT:  Head: Normocephalic and atraumatic.  Eyes: Conjunctivae and EOM are normal.  Cardiovascular: Normal rate and regular rhythm.   Pulmonary/Chest: Effort normal and breath sounds normal. No stridor. No respiratory distress.  Abdominal: She exhibits distension and ascites. There is tenderness.    Musculoskeletal: She exhibits no edema.  Bilateral lower extremity diffuse edema.  Neurological: She is alert and oriented to person, place, and time. No cranial nerve deficit.  Skin: Skin is warm and dry.  Psychiatric: She has a normal mood and affect.  Nursing note and vitals reviewed.   ED Course  Procedures (including critical care time) Labs Review Labs Reviewed  COMPREHENSIVE METABOLIC PANEL - Abnormal; Notable for the following:    Sodium 123 (*)    Chloride 98 (*)    CO2 19 (*)    Glucose, Bld 177 (*)    Creatinine, Ser 1.40 (*)    Calcium 7.3 (*)    Total Protein 4.2 (*)    Albumin 1.9 (*)    ALT 9 (*)    Alkaline Phosphatase 37 (*)    GFR calc non Af Amer 38 (*)    GFR calc Af Amer 45 (*)    All other  components within normal limits  CBC WITH DIFFERENTIAL/PLATELET - Abnormal; Notable for the following:    WBC 14.9 (*)    RBC 3.83 (*)    Hemoglobin 11.6 (*)    HCT 32.7 (*)    Neutro Abs 11.7 (*)    Monocytes Absolute 1.8 (*)    All other components within normal limits  I-STAT CG4 LACTIC ACID, ED - Abnormal; Notable for the following:    Lactic Acid, Venous 2.05 (*)    All other components within normal limits  ETHANOL  URINALYSIS, ROUTINE W REFLEX MICROSCOPIC (NOT AT [facility])  URINALYSIS, ROUTINE W REFLEX MICROSCOPIC (  NOT AT [facility])    Imaging Review US Abdomen Limited  01/04/2016  CLINICAL DATA:  Evaluation for possible paracentesis EXAM: LIMITED ABDOMEN ULTRASOUND FOR ASCITES TECHNIQUE: Limited ultrasound survey for ascites was performed in all four abdominal quadrants. COMPARISON:  Abdominal ultrasound of January 31st 2017. FINDINGS: There is ascites in the 4 quadrants of the abdomen. The volume is deemed insufficient for successful paracentesis. IMPRESSION: There is ascites but insufficient for therapeutic paracentesis. Electronically Signed   By: David  Martinique M.D.   On: 01/04/2016 14:15   This evaluation was performed earlier today, after the patient initially went to clinic with consideration of paracentesis to relieve pain.  I have personally reviewed and evaluated these images and lab results as part of my medical decision-making.   EMR notable for recent finding of peritoneal carcinomatosis via CAT scan. She is scheduled for chemotherapy tomorrow.   10:20 PM Patient aware of all findings thus far.  Na value 123, lower than on admission and recent D/C.   MDM  Patient with peritoneal carcinomatosis presents with concern of ongoing abdominal discomfort, bilateral lower extremities swelling, difficulty with urination. Notably, the patient has recently been informed of her advanced disease, is planning to start chemotherapy tomorrow. Here, the patient is awake and alert,  hemodynamically stable, but has not had lactic acidosis, leukocytosis, and hyponatremia, 123. Given the patient's critically abnormal value, she was admitted for further evaluation, management.  Carmin Muskrat, MD 01/04/16 2222

## 2016-01-04 NOTE — Discharge Instructions (Signed)
Follow with Primary MD Ebright,DHAVAL, MD in 7 days   Get CBC, CMP, checked  by Primary MD next visit.    Activity: As tolerated with Full fall precautions use walker/cane & assistance as needed   Disposition Home    Diet: Heart Healthy , with feeding assistance and aspiration precautions.  For Heart failure patients - Check your Weight same time everyday, if you gain over 2 pounds, or you develop in leg swelling, experience more shortness of breath or chest pain, call your Primary MD immediately. Follow Cardiac Low Salt Diet and 1.5 lit/day fluid restriction.   On your next visit with your primary care physician please Get Medicines reviewed and adjusted.   Please request your Prim.MD to go over all Hospital Tests and Procedure/Radiological results at the follow up, please get all Hospital records sent to your Prim MD by signing hospital release before you go home.   If you experience worsening of your admission symptoms, develop shortness of breath, life threatening emergency, suicidal or homicidal thoughts you must seek medical attention immediately by calling 911 or calling your MD immediately  if symptoms less severe.  You Must read complete instructions/literature along with all the possible adverse reactions/side effects for all the Medicines you take and that have been prescribed to you. Take any new Medicines after you have completely understood and accpet all the possible adverse reactions/side effects.   Do not drive, operating heavy machinery, perform activities at heights, swimming or participation in water activities or provide baby sitting services if your were admitted for syncope or siezures until you have seen by Primary MD or a Neurologist and advised to do so again.  Do not drive when taking Pain medications.    Do not take more than prescribed Pain, Sleep and Anxiety Medications  Special Instructions: If you have smoked or chewed Tobacco  in the last 2 yrs please  stop smoking, stop any regular Alcohol  and or any Recreational drug use.  Wear Seat belts while driving.   Please note  You were cared for by a hospitalist during your hospital stay. If you have any questions about your discharge medications or the care you received while you were in the hospital after you are discharged, you can call the unit and asked to speak with the hospitalist on call if the hospitalist that took care of you is not available. Once you are discharged, your primary care physician will handle any further medical issues. Please note that NO REFILLS for any discharge medications will be authorized once you are discharged, as it is imperative that you return to your primary care physician (or establish a relationship with a primary care physician if you do not have one) for your aftercare needs so that they can reassess your need for medications and monitor your lab values.

## 2016-01-04 NOTE — ED Notes (Signed)
Patients daughter requested that we wait for foley until they have seen MD.

## 2016-01-04 NOTE — H&P (Signed)
History and Physical  Patient Name: Brittany Schwartz     [id number]    DOB: 03-03-1951    DOA: 01/04/2016 Referring physician: Adrian Prows, MD PCP: [name], MD      Chief Complaint: Leg swelling  HPI: Brittany Schwartz is a 65 y.o. female with a past medical history significant for endometrial CA with recent worsening of peritoneal carcinomatosis with malignant ascites, NIDDM, and HTN who presents with leg swelling.  The patient was recently admitted from 1/27 until today with worsening adbominal pain, found to have new malignant ascites (and progression of peritoneal carcinomatosis), AKI, and hyponatremia.   The ascites was tapped twice, the AKI remains stable but did not improve with fluids, and the hyponatremia improved only minimally with paracentesis and fluids. She had a port placed and was discharged today with plans for follow-up tomorrow for chemotherapy.    This afternoon, her daughter noticed that her legs were symmetrically swollen, and brought her back to the ER to be checked out.  In the ED, Na 123, Cr 1.4, WBC 14.9K, Hgb 11.6.  She was tachycardic but afebrile.  A lactic acid level was minimally elevated.  TRH were asked to evaluate for admission.  The patient described bilaterally symmetric but painless leg swelling, new.  She had no fever, flank pain, dysuria.  She had no cough, sputum, dyspnea.  She had no confusion, worsening abdominal pain.  She was concerned that she wasn't making urine, had no desire to urinate.     Review of Systems:  All other systems negative except as just noted or noted in the history of present illness.  No Known Allergies  Prior to Admission medications   Medication Sig Start Date End Date Taking? Authorizing Provider  acetaminophen (TYLENOL) 325 MG tablet Take 650 mg by mouth every 6 (six) hours as needed. Reported on 11/20/2015   Yes Historical Provider, MD  ferrous fumarate (HEMOCYTE - 106 MG FE) 325 (106 FE) MG TABS tablet Take 1  tablet daily  on an empty stomach with OJ or vitamin C tablet 09/11/15  Yes [name], MD  glipiZIDE (GLUCOTROL XL) 5 MG 24 hr tablet Take 5 mg by mouth 2 (two) times daily.   Yes Historical Provider, MD  latanoprost (XALATAN) 0.005 % ophthalmic solution Place 1 drop into both eyes at bedtime.   Yes Historical Provider, MD  magnesium 30 MG tablet Take 30 mg by mouth daily.   Yes Historical Provider, MD  pantoprazole (PROTONIX) 40 MG tablet Take 1 tablet (40 mg total) by mouth daily. 11/20/15  Yes Everitt Amber, MD  pravastatin (PRAVACHOL) 20 MG tablet Take 20 mg by mouth daily.   Yes Historical Provider, MD  protein supplement shake (PREMIER PROTEIN) LIQD Take 237 mLs (8 oz total) by mouth 2 (two) times daily between meals. 01/04/16  Yes Silver Huguenin Elgergawy, MD  timolol (BETIMOL) 0.5 % ophthalmic solution Place 1 drop into both eyes 2 (two) times daily.   Yes Historical Provider, MD  feeding supplement, GLUCERNA SHAKE, (GLUCERNA SHAKE) LIQD Take 237 mLs by mouth daily. 01/04/16   Silver Huguenin Elgergawy, MD  gabapentin (NEURONTIN) 100 MG capsule Take 1 capsule (100 mg total) by mouth at bedtime. Take 1 at bedtime for neuropathy Patient not taking: Reported on 01/04/2016 08/14/15   [name], MD  lidocaine-prilocaine (EMLA) cream Apply to Porta-Cath 1-2 hrs prior to access as directed. 01/02/16   [name], MD  LORazepam (ATIVAN) 0.5 MG tablet Place 1 tablet under tongue  or swallow every 6 hours as needed for nausea. Will make drowsy. 01/02/16   [name], MD  ondansetron (ZOFRAN) 8 MG tablet Take 1 tablet by mouth every 8 hours as needed for nausea. Will not make drowsy. 01/02/16   [name], MD  oxyCODONE (ROXICODONE) 5 MG immediate release tablet Take 1 tablet (5 mg total) by mouth every 6 (six) hours as needed for severe pain. 01/04/16   Albertine Patricia, MD  prochlorperazine (COMPAZINE) 10 MG tablet Take 1 tablet (10 mg total) by mouth every 6 (six) hours as needed for nausea or  vomiting. 01/02/16   [name], MD    Past Medical History  Diagnosis Date  . Hypertension   . Glaucoma   . Diabetes mellitus without complication ([place])   . Palpitations   . Muscle cramps   . Postmenopausal vaginal bleeding   . Uterine cancer [facility])     Past Surgical History  Procedure Laterality Date  . Robotic assisted total hysterectomy with bilateral salpingo oopherectomy Bilateral 01/19/2015    Procedure: XI ROBOTIC ASSISTED TOTAL HYSTERECTOMY WITH BILATERAL SALPINGO OOPHORECTOMY;  Surgeon: [name], MD;  Location: [facility] ORS;  Service: Gynecology;  Laterality: Bilateral;    Family history: family history includes Arrhythmia in her mother; Diabetes in her father.  Social History: Patient lives with her husband.  She is from [place], lived many years in [place], and moved to [place] 4 years ago.  She never smoked.  She did not work.  She has two daughters, one in [place], one in [place].         Physical Exam: BP 145/71 mmHg  Pulse 110  Temp(Src) 97.7 F (36.5 C) (Oral)  Resp 16  SpO2 100% General appearance: Well-developed, older adult female, alert and in no acute distress.   Eyes: Anicteric, conjunctiva pink, lids and lashes normal.     ENT: No nasal deformity, discharge, or epistaxis.  OP moist without lesions.   Lymph: No cervical or supraclavicular lymphadenopathy. Skin: Warm and dry.  There is redness/hyperpigmentation on the left flank, the old sealed paracentesis marks are visible. Cardiac: Tachycardic, regular, nl S1-S2, no murmurs appreciated.  Capillary refill is brisk.  JVP normal.  Some pitting and non-pitting edema of both LE symmetrically, to above knees.  Radial and DP pulses 2+ and symmetric. Respiratory: Normal respiratory rate and rhythm.  CTAB without rales or wheezes. Abdomen: Abdomen round and tense.  Mild TTP in R periumbilical area. MSK: No deformities or effusions. Neuro: Sensorium intact and responding to questions, attention normal.   Speech is fluent.  Appears somewhat weak, but moves all extremities equally and with normal coordination.   No tremor.  Cranial nerves normal. Psych: Behavior appropriate.  Affect normal.  No evidence of aural or visual hallucinations or delusions.       Labs on Admission:  The metabolic panel shows  Sodium 123, creatinine 1.4 (both essentially unchanged from  [place]).  trend amylases and bilirubin normal. Alcohol level negative. The lactic acid level is 2.05 mmol per liter. The complete blood count shows  WBC 14.9 K/uL , increasing from discharge.  hemoglobin stable after transfusion. Platelet count normal.       Radiological Exams on Admission: Personally reviewed: US Abdomen Limited 01/04/2016  CLINICAL DATA:  Evaluation for possible paracentesis EXAM: LIMITED ABDOMEN ULTRASOUND FOR ASCITES TECHNIQUE: Limited ultrasound survey for ascites was performed in all four abdominal quadrants. COMPARISON:  Abdominal ultrasound of January 31st 2017. FINDINGS: There is ascites in  the 4 quadrants of the abdomen. The volume is deemed insufficient for successful paracentesis. IMPRESSION: There is ascites but insufficient for therapeutic paracentesis. Electronically Signed   By: David  Martinique M.D.   On: 01/04/2016 14:15        Assessment/Plan 1. Malignant ascites and leg swelling in setting of peritoneal carcinomatosis and endometrial CA:  The appears to have reaccumulated, although I am not sure if waiting until the planned paracentesis on Monday isn't reasonable.    The patient came in initially for concern for leg swelling.  Recent normal echo.  Discussed CT from 1/27 with radiology, and there is nothing compressing IVC or near it that could cause IVC compression (or bilateral iliac compression).  The degree of swelling appears symmetric and consistent with the type of 3rd spacing I would expect from being sedentary and having fluids for 5 days.  Bilateral DVT is doubted as well.     -Mobilization encouraged -If ascites much more painful tomorrow, could consider repeat paracentesis while inpatient -Message sent by [facility] to Dr. Marko Plume as Juluis Rainier NOTE: patient scheduled to restart Chemo tomorrow   2. AKI with decreased UOP:  The patient's baseline Cr is 0.8-0.9.  In the last week, it remained stable despite fluids.   -Check UA again for RBC or protein -Check FeNA -Trial albumin if ascites worsens -Strict I/Os  3. Hyponatremia:  Last time the patient's free water clearance was negative, and she appeared intravascularly depleted.  Salt tabs and water restriction were attempted and did not help.  Paracentesis appeared to help, as did fluids (with obvious limitations to both strategies). -Check urine osmolality again -Fluid bolus and gentle fluids overnight -Patient remains off chlorthalidone -Trend BMP  4. Elevated lactic acid and leukocytosis:  There is no obvious source of infection and at present I doubt one.  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 -Obtain blood cultures -Follow urine culture -Hold antibiotics unless fever, leukocytosis worsens or culture positive -Repeat lactic acid after fluids  5. HTN:  -Hold home atenolol and chlorthalidone for now  6. NIDDM:  -Hold home glipizide -Sliding scale corrections  7. Anemia:  Improved after transfusion earlier this week -Continue home iron    DVT PPx: Lovenox Diet: Diabetic Consultants: None Code Status: FULL Family Communication: Daughter, present at bedside.  Discussed differential for hyponatremia, lactic acidosis, plans for observation, attempts to coordinate chemo for tomorrow if appropriate.  Medical decision making: What exists of the patient's previous chart was reviewed in depth and the case was discussed with Dr. Vanita Panda and Radiology by phon. Patient seen 11:04 PM on 01/04/2016.  Disposition Plan:  I recommend admission to  observation, med surg status.  Clinical condition: stable.  Anticipate that we will repeat urine electrolytes and attempt anther fluid bolus.  Patient due for chemotherapy tomorrow, I have messaged Dr. Marko Plume.  If leukocytosis/lactate does not prevent chemo, the patient may need expedited discharge in AM before chemo, to get chemotherapy as inpatient, or to delay.      Edwin Dada [facility] Pager [phone]

## 2016-01-04 NOTE — Progress Notes (Signed)
Pt discharged home with family in stable condition. Discharge instructions and scripts given. Pt and family verbalized understanding.

## 2016-01-04 NOTE — Progress Notes (Signed)
Patient ID: Brittany Schwartz, female   DOB: [birth year], 65 y.o.   MRN: [id number] Patient brought down for therapeutic paracentesis.  After evaluating her with [place], she is found to not have enough fluid to perform the paracentesis.  She is being discharged today.  If she were to redevelop more fluid as an outpatient, her PCP can order for an outpatient paracentesis.  Dermabond was applied to her prior puncture site due to persistent serous oozing.  Patient request her daughter be called.  I will attempt to contact her.  [name] E 9:44 AM 01/04/2016

## 2016-01-04 NOTE — ED Notes (Signed)
Notified EDP,Lockwood,MD., pt. i-stat CG4 Lactic acid 2.05. And RN, Nicole Kindred.

## 2016-01-04 NOTE — ED Notes (Signed)
MD at bedside. 

## 2016-01-04 NOTE — ED Notes (Signed)
Foley placed.  3 CC's into collection device.  Unable to obtain specimen at this time.

## 2016-01-04 NOTE — Discharge Summary (Signed)
Brittany Schwartz, is a 65 y.o. female  DOB Oct 09, 1951  MRN [id number].  Admission date:  12/29/2015  Admitting Physician  Domenic Polite, MD  Discharge Date:  01/04/2016   Primary MD  [name], MD  Recommendations for primary care physician for things to follow:  - please check CBC, BMP during next visit. - Patient with known ascites, will need paracentesis on as needed basis   Admission Diagnosis  Metastatic cancer ([place]) [C80.1] Ascites [R18.8]   Discharge Diagnosis  Metastatic cancer ([place]) [C80.1] Ascites [R18.8]   Active Problems:   Endometrial cancer ([place])   Diabetes mellitus ([place])   Hyponatremia   Type 2 diabetes mellitus without complication (HCC)   Essential hypertension   Metastatic cancer (H[facility])   Ascites   Hydronephrosis, left   Abdominal distension   Non-insulin dependent type 2 diabetes mellitus ([place])   Malignant ascites      Past Medical History  Diagnosis Date  . Hypertension   . Glaucoma   . Diabetes mellitus without complication ([place])   . Palpitations   . Muscle cramps   . Postmenopausal vaginal bleeding   . Uterine cancer [facility])     Past Surgical History  Procedure Laterality Date  . Robotic assisted total hysterectomy with bilateral salpingo oopherectomy Bilateral 01/19/2015    Procedure: XI ROBOTIC ASSISTED TOTAL HYSTERECTOMY WITH BILATERAL SALPINGO OOPHORECTOMY;  Surgeon: [name], MD;  Location: [facility];  Service: Gynecology;  Laterality: Bilateral;       History of present illness and  Hospital Course:     Kindly see H&P for history of present illness and admission details, please review complete Labs, Consult reports and Test reports for all details in brief  HPI  from the history and physical done on the day of admission 12/29/2015 HPI: Brittany Schwartz is a 65 y.o. female with past medical history of stage IIIa endometrial cancer, underwent robotic  hysterectomy in 2/16, followed by chemotherapy and most recently vaginal brachy therapy. Radiation was completed in October 2 016, it was felt that she had a complete response. Today presents to the ER with the above complaints. Patient reports mild lower abdominal/pelvic discomfort for the last 2 weeks, she had some brief trouble urinating at the time and was given antibiotics. She felt better and then on Friday noticed increased abdominal distention and pain which is primarily in the lower pelvis. She saw her medical doctor, had an x-ray done and was put on a liquid diet for 2 days. Due to persistence of symptoms came to the ER today, labs remarkable for sodium of 118, creatinine 1.35 CT abdomen pelvis unfortunately showing extensive peritoneal carcinomatosis, ascites, Extensive omental caking and masses. Mass in the left pelvis causing left ureteral obstruction and moderate hydronephrosis. [facility] was consulted for further evaluation and management   Hospital Course  65 y.o. female with past medical history of stage IIIa endometrial cancer, underwent robotic hysterectomy in 2/16, followed by chemotherapy and most recently vaginal brachy therapy, presents with malignant ascites, requiring paracentesis 2, as well PRBC  transfusion for her significant anemia.  Endometrial cancer , metastatic , malignant ascites -Now with extensive new carcinomatosis consistent with metastatic disease and malignant ascites , rising CA125. -s/p ultrasound-guided paparacentesis on 1/28, 1/31, another thoracentesis attempted to discharge 01/04/2016 but no enough fluid to drain most likely due to continuous oozing after last paracentesis, Dermabond was applied to her prior puncture site due to persistent serous oozing by IR, will need paracentesis on as needed basis as an outpatient. - Oncology consult with Dr. Marko Plume greatly appreciated, plan to resume chemotherapy tomorrow  Left hydronephrosis  -Secondary to pelvic mass,  was discussed with urology Dr. Junious Silk  -Recommend continued observation/supportive care and no need for acute intervention at this time since she is not very symptomatic from this standpoint and kidney function close to normal  -She will need a follow-up renal ultrasound and follow-up with them as outpatient    Hyponatremia -Suspect secondary to diuretic (chlorthalidone) and possibly component of volume depletion and dehydration -continue hydrate, hold chlorthalidone -urine sodium 40and osmolarity 500 -asymptomatic,  - Treated with salt tabs - Patient with leakage from paracentesis site, actually this will help to prevent rapid reaccumulation for now.  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   Diabetes mellitus (noninsulin dependent) -on sliding scale insulin during hospital stay , a1c 6.6, will hold metformin on discharge, continue with Glucotrol  Essential hypertension -Blood pressure been on the lower side during hospital stay, discontinue all antihypertensive medication    Discharge Condition:  Stable   Follow UP  Follow-up Information    Follow up with ESKRIDGE, MATTHEW, MD In 2 weeks.   Specialty:  Urology   Why:  hydronephrosis   Contact information:   [place] [address] [phone]       Follow up with Dartt,DHAVAL, MD. Schedule an appointment as soon as possible for a visit in 1 week.   Specialty:  Endocrinology   Contact information:   [id number] [place] [address] [phone]         Discharge Instructions  and  Discharge Medications     Discharge  Instructions    Discharge instructions    Complete by:  As directed   Follow with Primary MD Wessling,DHAVAL, MD in 7 days   Get CBC, CMP, 2 view Chest X ray checked  by Primary MD next visit.    Activity: As tolerated with Full fall precautions use walker/cane & assistance as needed   Disposition Home    Diet: Heart Healthy , with  feeding assistance and aspiration precautions.  For Heart failure patients - Check your Weight same time everyday, if you gain over 2 pounds, or you develop in leg swelling, experience more shortness of breath or chest pain, call your Primary MD immediately. Follow Cardiac Low Salt Diet and 1.5 lit/day fluid restriction.   On your next visit with your primary care physician please Get Medicines reviewed and adjusted.   Please request your Prim.MD to go over all Hospital Tests and Procedure/Radiological results at the follow up, please get all Hospital records sent to your Prim MD by signing hospital release before you go home.  If you experience worsening of your admission symptoms, develop shortness of breath, life threatening emergency, suicidal or homicidal thoughts you must seek medical attention immediately by calling 911 or calling your MD immediately  if symptoms less severe.  You Must read complete instructions/literature along with all the possible adverse reactions/side effects for all the Medicines you take and that have been prescribed to you. Take any new Medicines after you have completely understood and accpet all the possible adverse reactions/side effects.   Do not drive, operating heavy machinery, perform activities at heights, swimming or participation in water activities or provide baby sitting services if your were admitted for syncope or siezures until you have seen by Primary MD or a Neurologist and advised to do so again.  Do not drive when taking Pain medications.    Do not take more than prescribed Pain, Sleep and Anxiety  Medications  Special Instructions: If you have smoked or chewed Tobacco  in the last 2 yrs please stop smoking, stop any regular Alcohol  and or any Recreational drug use.  Wear Seat belts while driving.   Please note  You were cared for by a hospitalist during your hospital stay. If you have any questions about your discharge medications or the care you received while you were in the hospital after you are discharged, you can call the unit and asked to speak with the hospitalist on call if the hospitalist that took care of you is not available. Once you are discharged, your primary care physician will handle any further medical issues. Please note that NO REFILLS for any discharge medications will be authorized once you are discharged, as it is imperative that you return to your primary care physician (or establish a relationship with a primary care physician if you do not have one) for your aftercare needs so that they can reassess your need for medications and monitor your lab values.     Increase activity slowly    Complete by:  As directed             Medication List    STOP taking these medications        atenolol-chlorthalidone 50-25 MG tablet  Commonly known as:  TENORETIC     lisinopril 10 MG tablet  Commonly known as:  PRINIVIL,ZESTRIL     metFORMIN 1000 MG tablet  Commonly known as:  GLUCOPHAGE     pravastatin 20 MG tablet  Commonly known as:  PRAVACHOL      TAKE these medications        acetaminophen 325 MG tablet  Commonly known as:  TYLENOL  Take 650 mg by mouth every 6 (six) hours as needed. Reported on 11/20/2015     feeding supplement (GLUCERNA SHAKE) Liqd  Take 237 mLs by mouth daily.     ferrous fumarate 325 (106 Fe) MG Tabs tablet  Commonly known as:  HEMOCYTE - 106 mg FE  Take 1 tablet daily  on an empty stomach with OJ or vitamin C tablet     gabapentin 100 MG capsule  Commonly known as:  NEURONTIN  Take 1 capsule (100 mg total) by mouth at  bedtime. Take 1 at bedtime for neuropathy     glipiZIDE 5 MG 24 hr tablet  Commonly known as:  GLUCOTROL XL  Take 5 mg by mouth 2 (two) times daily.     latanoprost 0.005 % ophthalmic solution  Commonly known as:  XALATAN  Place 1 drop into both eyes at bedtime.  lidocaine-prilocaine cream  Commonly known as:  EMLA  Apply to Porta-Cath 1-2 hrs prior to access as directed.     LORazepam 0.5 MG tablet  Commonly known as:  ATIVAN  Place 1 tablet under tongue or swallow every 6 hours as needed for nausea. Will make drowsy.     magnesium 30 MG tablet  Take 30 mg by mouth daily.     ondansetron 8 MG tablet  Commonly known as:  ZOFRAN  Take 1 tablet by mouth every 8 hours as needed for nausea. Will not make drowsy.     pantoprazole 40 MG tablet  Commonly known as:  PROTONIX  Take 1 tablet (40 mg total) by mouth daily.     prochlorperazine 10 MG tablet  Commonly known as:  COMPAZINE  Take 1 tablet (10 mg total) by mouth every 6 (six) hours as needed for nausea or vomiting.     protein supplement shake Liqd  Commonly known as:  PREMIER PROTEIN  Take 237 mLs (8 oz total) by mouth 2 (two) times daily between meals.     timolol 0.5 % ophthalmic solution  Commonly known as:  BETIMOL  Place 1 drop into both eyes 2 (two) times daily.          Diet and Activity recommendation: See Discharge Instructions above   Consults obtained -  Oncology  Major procedures and Radiology Reports - PLEASE review detailed and final reports for all details, in brief -   Paracentesis 1/28, 1/31,  2 units PRBC transfusion 01/03/2016  Ct Abdomen Pelvis W Contrast  12/29/2015  CLINICAL DATA:  Bilateral upper abdominal pain for a week. History of endometrial cancer. The patient has completed chemotherapy and radiation therapy. Initial encounter. EXAM: CT ABDOMEN AND PELVIS WITH CONTRAST TECHNIQUE: Multidetector CT imaging of the abdomen and pelvis was performed using the standard protocol  following bolus administration of intravenous contrast. CONTRAST:  80 mL OMNIPAQUE IOHEXOL 300 MG/ML  SOLN COMPARISON:  CT abdomen and pelvis 07/07/2015 FINDINGS: Mild dependent atelectasis is seen in the lung bases. Heart size is mildly enlarged. There is a trace right pleural effusion. No pericardial effusion or left pleural effusion. The patient has a new moderate to large volume of abdominal and pelvic ascites. Extensive omental caking is new since the prior examination and appears worst in the right mid abdomen. More masslike omental lesion is seen at the level of the right iliac wing and measures 6.1 cm AP by 3.3 cm transverse on image 62. Pelvic mass lesions are also identified. In the left hemipelvis, a lesion measuring approximately 6.3 cm AP by 2.9 cm transverse is seen on image 78. The liver, gallbladder, spleen, adrenal glands, pancreas and right kidney appear normal. There is moderate left hydronephrosis with delayed excretion of contrast from the left kidney. 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 The patient is status post hysterectomy. No focal bony abnormality is identified. IMPRESSION: New extensive peritoneal carcinomatosis consistent with metastatic endometrial carcinoma with associated moderate to large volume of ascites, extensive omental caking and masses. Mass in the left pelvis results and obstruction of the left ureter and moderate hydronephrosis. These results were called by telephone at the time of interpretation on 12/29/2015 at 2:58 pm to Dr. Dalia Heading , who verbally acknowledged these results. Electronically Signed   By: Inge Rise M.D.   On: 12/29/2015 15:00   US Paracentesis  01/02/2016  INDICATION: Patient with history of grade 3 endometrial  carcinoma, recurrent malignant ascites. Request is made for therapeutic paracentesis. EXAM: ULTRASOUND GUIDED THERAPEUTIC PARACENTESIS MEDICATIONS: None. COMPLICATIONS: None  immediate. PROCEDURE: Informed written consent was obtained from the patient after a discussion of the risks, benefits and alternatives to treatment. A timeout was performed prior to the initiation of the procedure. Initial ultrasound scanning demonstrates a moderate amount of ascites within the left mid to lower abdominal quadrant. The left mid to lower abdomen was prepped and draped in the usual sterile fashion. 1% lidocaine was used for local anesthesia. Following this, a Yueh catheter was introduced. An ultrasound image was saved for documentation purposes. The paracentesis was performed. The catheter was removed and a dressing was applied. The patient tolerated the procedure well without immediate post procedural complication. FINDINGS: A total of approximately 3.5 liters of bloody fluid was removed. IMPRESSION: Successful ultrasound-guided therapeutic paracentesis yielding 3.5 liters of peritoneal fluid. Read by: Rowe Robert, PA-C Electronically Signed   By: Lucrezia Europe M.D.   On: 01/02/2016 12:15   US Paracentesis  12/30/2015  [name], PA-C     12/30/2015  1:26 PM Successful US guided paracentesis from RUQ. Yielded 4L of bloody ascitic fluid. No immediate complications. Pt tolerated well. Specimen was sent for labs. [name] PA-C 12/30/2015 1:07 PM   Ir Cyndy Freeze Guide Cv Line Right  01/02/2016  CLINICAL DATA:  Endometrial carcinoma, needs long-term venous access for chemotherapy regimen. EXAM: TUNNELED PORT CATHETER PLACEMENT WITH ULTRASOUND AND FLUOROSCOPIC GUIDANCE FLUOROSCOPY TIME:  0.3 minutes, 41 uGym2 DAP ANESTHESIA/SEDATION: Intravenous Fentanyl and Versed were administered as conscious sedation during continuous monitoring of the patient's level of consciousness and physiological / cardiorespiratory status by the radiology RN, with a total moderate sedation time of 14 minutes. TECHNIQUE: The procedure, risksbenefits, and alternatives were explained to the patient. Questions regarding the  procedure were encouraged and answered. The patient understands and consents to the procedure. As antibiotic prophylaxis, cefazolin 2 g IV was ordered pre-procedure and administered intravenously within one hour of incision., Patency of the right IJ vein was confirmed with ultrasound with image documentation. An appropriate skin site was determined. Skin site was marked. Region was prepped using maximum barrier technique including cap and mask, sterile gown, sterile gloves, large sterile sheet, and Chlorhexidine as cutaneous antisepsis. The region was infiltrated locally with 1% lidocaine. Under real-time ultrasound guidance, the right IJ vein was accessed with a 21 gauge micropuncture needle; the needle tip within the vein was confirmed with ultrasound image documentation. Needle was exchanged over a 018 guidewire for transitional dilator which allowed passage of the [facility] wire into the IVC. Over this, the transitional dilator was exchanged for a 5 [place] MPA catheter. A small incision was made on the right anterior chest wall and a subcutaneous pocket fashioned. The power-injectable port was positioned and its catheter tunneled to the right IJ dermatotomy site. The MPA catheter was exchanged over an Amplatz wire for a peel-away sheath, through which the port catheter, which had been trimmed to the appropriate length, was advanced and positioned under fluoroscopy with its tip at the cavoatrial junction. Spot chest radiograph confirms good catheter position and no pneumothorax. The pocket was closed with deep interrupted and subcuticular continuous 3-0 Monocryl sutures. The port was flushed per protocol. The incisions were covered with Dermabond then covered with a sterile dressing. COMPLICATIONS: COMPLICATIONS None immediate IMPRESSION: Technically successful right IJ power-injectable port catheter placement. Ready for routine use. Electronically Signed   By: Lucrezia Europe M.D.   On: 01/02/2016 14:23  Ir US Guide  Vasc Access Right  01/02/2016  CLINICAL DATA:  Endometrial carcinoma, needs long-term venous access for chemotherapy regimen. EXAM: TUNNELED PORT CATHETER PLACEMENT WITH ULTRASOUND AND FLUOROSCOPIC GUIDANCE FLUOROSCOPY TIME:  0.3 minutes, 41 uGym2 DAP ANESTHESIA/SEDATION: Intravenous Fentanyl and Versed were administered as conscious sedation during continuous monitoring of the patient's level of consciousness and physiological / cardiorespiratory status by the radiology RN, with a total moderate sedation time of 14 minutes. TECHNIQUE: The procedure, risksbenefits, and alternatives were explained to the patient. Questions regarding the procedure were encouraged and answered. The patient understands and consents to the procedure. As antibiotic prophylaxis, cefazolin 2 g IV was ordered pre-procedure and administered intravenously within one hour of incision., Patency of the right IJ vein was confirmed with ultrasound with image documentation. An appropriate skin site was determined. Skin site was marked. Region was prepped using maximum barrier technique including cap and mask, sterile gown, sterile gloves, large sterile sheet, and Chlorhexidine as cutaneous antisepsis. The region was infiltrated locally with 1% lidocaine. Under real-time ultrasound guidance, the right IJ vein was accessed with a 21 gauge micropuncture needle; the needle tip within the vein was confirmed with ultrasound image documentation. Needle was exchanged over a 018 guidewire for transitional dilator which allowed passage of the [facility] wire into the IVC. Over this, the transitional dilator was exchanged for a 5 [place] MPA catheter. A small incision was made on the right anterior chest wall and a subcutaneous pocket fashioned. The power-injectable port was positioned and its catheter tunneled to the right IJ dermatotomy site. The MPA catheter was exchanged over an Amplatz wire for a peel-away sheath, through which the port catheter, which had  been trimmed to the appropriate length, was advanced and positioned under fluoroscopy with its tip at the cavoatrial junction. Spot chest radiograph confirms good catheter position and no pneumothorax. The pocket was closed with deep interrupted and subcuticular continuous 3-0 Monocryl sutures. The port was flushed per protocol. The incisions were covered with Dermabond then covered with a sterile dressing. COMPLICATIONS: COMPLICATIONS None immediate IMPRESSION: Technically successful right IJ power-injectable port catheter placement. Ready for routine use. Electronically Signed   By: Lucrezia Europe M.D.   On: 01/02/2016 14:23    Micro Results     No results found for this or any previous visit (from the past 240 hour(s)).     Today   Subjective:   Brittany Schwartz today has no headache,no chest or abdominal pain,no new weakness tingling or numbness, feels much better wants to go home today.   Objective:   Blood pressure 121/67, pulse 95, temperature 98.4 F (36.9 C), temperature source Oral, resp. rate 16, height 5\' 1"  (1.549 m), weight 64.411 kg (142 lb), SpO2 100 %.   Intake/Output Summary (Last 24 hours) at 01/04/16 1129 Last data filed at 01/03/16 1845  Gross per 24 hour  Intake    790 ml  Output      0 ml  Net    790 ml    Exam Awake Alert, Oriented X 3, No new F.N deficits, Normal affect [place].AT,PERRAL Supple Neck,No JVD,  Symmetrical Chest wall movement, Good air movement bilaterally RRR,No Gallops,Rubs or new Murmurs, No Parasternal Heave +ve B.Sounds, distended with mild ascites , oozing is much less today, No rebound - guarding or rigidity. No Cyanosis, Clubbing or edema, No new Rash or bruise  Data Review   CBC w Diff: Lab Results  Component Value Date   WBC 12.6* 01/03/2016   WBC  6.5 11/20/2015   HGB 11.6* 01/04/2016   HGB 9.6* 11/20/2015   HCT 33.0* 01/04/2016   HCT 29.1* 11/20/2015   PLT 287 01/03/2016   PLT 165 11/20/2015   LYMPHOPCT 29.3 11/20/2015    MONOPCT 10.0 11/20/2015   EOSPCT 1.9 11/20/2015   BASOPCT 0.7 11/20/2015    CMP: Lab Results  Component Value Date   NA 125* 01/03/2016   NA 137 11/20/2015   K 4.3 01/03/2016   K 4.5 11/20/2015   CL 103 01/03/2016   CO2 17* 01/03/2016   CO2 24 11/20/2015   BUN 13 01/03/2016   BUN 12.1 11/20/2015   CREATININE 1.33* 01/03/2016   CREATININE 0.9 11/20/2015   GLU 347* 06/16/2015   PROT 5.7* 12/30/2015   PROT 6.8 11/20/2015   ALBUMIN 3.1* 12/30/2015   ALBUMIN 3.8 11/20/2015   BILITOT 0.6 12/30/2015   BILITOT 0.39 11/20/2015   ALKPHOS 36* 12/30/2015   ALKPHOS 64 11/20/2015   AST 18 12/30/2015   AST 16 11/20/2015   ALT 12* 12/30/2015   ALT 17 11/20/2015  .   Total Time in preparing paper work, data evaluation and todays exam - 35 minutes  Lavance Beazer M.D on 01/04/2016 at 11:29 AM  [facility]   Office  [phone]

## 2016-01-05 ENCOUNTER — Telehealth: Payer: Self-pay | Admitting: *Deleted

## 2016-01-05 ENCOUNTER — Observation Stay (HOSPITAL_COMMUNITY): Payer: BLUE CROSS/BLUE SHIELD

## 2016-01-05 ENCOUNTER — Ambulatory Visit: Payer: BLUE CROSS/BLUE SHIELD

## 2016-01-05 ENCOUNTER — Telehealth: Payer: Self-pay | Admitting: Oncology

## 2016-01-05 ENCOUNTER — Other Ambulatory Visit: Payer: BLUE CROSS/BLUE SHIELD

## 2016-01-05 ENCOUNTER — Ambulatory Visit: Payer: Self-pay | Admitting: Gynecologic Oncology

## 2016-01-05 ENCOUNTER — Other Ambulatory Visit: Payer: Self-pay | Admitting: Oncology

## 2016-01-05 DIAGNOSIS — D6481 Anemia due to antineoplastic chemotherapy: Secondary | ICD-10-CM

## 2016-01-05 DIAGNOSIS — N179 Acute kidney failure, unspecified: Secondary | ICD-10-CM

## 2016-01-05 DIAGNOSIS — E871 Hypo-osmolality and hyponatremia: Secondary | ICD-10-CM | POA: Diagnosis not present

## 2016-01-05 DIAGNOSIS — T451X5A Adverse effect of antineoplastic and immunosuppressive drugs, initial encounter: Secondary | ICD-10-CM

## 2016-01-05 DIAGNOSIS — C801 Malignant (primary) neoplasm, unspecified: Secondary | ICD-10-CM

## 2016-01-05 DIAGNOSIS — E872 Acidosis: Secondary | ICD-10-CM | POA: Diagnosis not present

## 2016-01-05 LAB — CBC
HCT: 30.9 % — ABNORMAL LOW (ref 36.0–46.0)
HEMOGLOBIN: 11.2 g/dL — AB (ref 12.0–15.0)
MCH: 30.7 pg (ref 26.0–34.0)
MCHC: 36.2 g/dL — ABNORMAL HIGH (ref 30.0–36.0)
MCV: 84.7 fL (ref 78.0–100.0)
Platelets: 203 10*3/uL (ref 150–400)
RBC: 3.65 MIL/uL — AB (ref 3.87–5.11)
RDW: 13 % (ref 11.5–15.5)
WBC: 15.4 10*3/uL — ABNORMAL HIGH (ref 4.0–10.5)

## 2016-01-05 LAB — URINALYSIS, ROUTINE W REFLEX MICROSCOPIC
Bilirubin Urine: NEGATIVE
Glucose, UA: NEGATIVE mg/dL
HGB URINE DIPSTICK: NEGATIVE
Ketones, ur: NEGATIVE mg/dL
Leukocytes, UA: NEGATIVE
NITRITE: NEGATIVE
PROTEIN: 100 mg/dL — AB
Specific Gravity, Urine: 1.028 (ref 1.005–1.030)
pH: 6 (ref 5.0–8.0)

## 2016-01-05 LAB — BASIC METABOLIC PANEL
ANION GAP: 6 (ref 5–15)
BUN: 18 mg/dL (ref 6–20)
CALCIUM: 7.2 mg/dL — AB (ref 8.9–10.3)
CHLORIDE: 98 mmol/L — AB (ref 101–111)
CO2: 17 mmol/L — AB (ref 22–32)
Creatinine, Ser: 1.23 mg/dL — ABNORMAL HIGH (ref 0.44–1.00)
GFR calc non Af Amer: 45 mL/min — ABNORMAL LOW (ref >60)
GFR, EST AFRICAN AMERICAN: 52 mL/min — AB (ref >60)
GLUCOSE: 172 mg/dL — AB (ref 65–99)
Potassium: 4.5 mmol/L (ref 3.5–5.1)
Sodium: 121 mmol/L — ABNORMAL LOW (ref 135–145)

## 2016-01-05 LAB — URINE MICROSCOPIC-ADD ON: RBC / HPF: NONE SEEN RBC/hpf (ref 0–5)

## 2016-01-05 LAB — LACTIC ACID, PLASMA
LACTIC ACID, VENOUS: 1.4 mmol/L (ref 0.5–2.0)
LACTIC ACID, VENOUS: 1.4 mmol/L (ref 0.5–2.0)

## 2016-01-05 LAB — GLUCOSE, CAPILLARY
GLUCOSE-CAPILLARY: 146 mg/dL — AB (ref 65–99)
GLUCOSE-CAPILLARY: 186 mg/dL — AB (ref 65–99)
GLUCOSE-CAPILLARY: 219 mg/dL — AB (ref 65–99)
GLUCOSE-CAPILLARY: 204 mg/dL — AB (ref 65–99)
Glucose-Capillary: 143 mg/dL — ABNORMAL HIGH (ref 65–99)

## 2016-01-05 LAB — URINE CULTURE

## 2016-01-05 LAB — OSMOLALITY: Osmolality: 252 mOsm/kg — ABNORMAL LOW (ref 275–295)

## 2016-01-05 LAB — CREATININE, URINE, RANDOM: CREATININE, URINE: 232.45 mg/dL

## 2016-01-05 LAB — SODIUM, URINE, RANDOM: SODIUM UR: 27 mmol/L

## 2016-01-05 LAB — OSMOLALITY, URINE: Osmolality, Ur: 776 mOsm/kg (ref 300–900)

## 2016-01-05 MED ORDER — ONDANSETRON HCL 4 MG/2ML IJ SOLN: 4.0000 mg | Freq: Four times a day (QID) | INTRAMUSCULAR | Status: DC | PRN

## 2016-01-05 MED ORDER — ACETAMINOPHEN 325 MG PO TABS: 650.0000 mg | ORAL_TABLET | Freq: Four times a day (QID) | ORAL | Status: DC | PRN

## 2016-01-05 MED ORDER — LATANOPROST 0.005 % OP SOLN
1.0000 [drp] | Freq: Every day | OPHTHALMIC | Status: DC
  Administered 2016-01-05 – 2016-01-06 (×3): 1 [drp] via OPHTHALMIC
  Filled 2016-01-05: qty 2.5

## 2016-01-05 MED ORDER — PANTOPRAZOLE SODIUM 40 MG PO TBEC
40.0000 mg | DELAYED_RELEASE_TABLET | Freq: Every day | ORAL | Status: DC
  Administered 2016-01-05 – 2016-01-07 (×3): 40 mg via ORAL
  Filled 2016-01-05 (×3): qty 1

## 2016-01-05 MED ORDER — FERROUS FUMARATE 325 (106 FE) MG PO TABS
1.0000 | ORAL_TABLET | Freq: Every day | ORAL | Status: DC
  Administered 2016-01-05 – 2016-01-07 (×3): 106 mg via ORAL
  Filled 2016-01-05 (×3): qty 1

## 2016-01-05 MED ORDER — SODIUM CHLORIDE 0.9% FLUSH: 10.0000 mL | INTRAVENOUS | Status: DC | PRN

## 2016-01-05 MED ORDER — PRAVASTATIN SODIUM 20 MG PO TABS
20.0000 mg | ORAL_TABLET | Freq: Every day | ORAL | Status: DC
  Administered 2016-01-05 – 2016-01-07 (×3): 20 mg via ORAL
  Filled 2016-01-05 (×3): qty 1

## 2016-01-05 MED ORDER — FUROSEMIDE 10 MG/ML IJ SOLN
40.0000 mg | Freq: Once | INTRAMUSCULAR | Status: AC
  Administered 2016-01-05: 40 mg via INTRAVENOUS
  Filled 2016-01-05: qty 4

## 2016-01-05 MED ORDER — OXYCODONE HCL 5 MG PO TABS
5.0000 mg | ORAL_TABLET | ORAL | Status: DC | PRN
  Administered 2016-01-05 – 2016-01-07 (×5): 5 mg via ORAL
  Filled 2016-01-05 (×5): qty 1

## 2016-01-05 MED ORDER — INSULIN ASPART 100 UNIT/ML ~~LOC~~ SOLN
0.0000 [IU] | Freq: Every day | SUBCUTANEOUS | Status: DC
  Administered 2016-01-05: 2 [IU] via SUBCUTANEOUS
  Administered 2016-01-06: 3 [IU] via SUBCUTANEOUS

## 2016-01-05 MED ORDER — TIMOLOL MALEATE 0.5 % OP SOLN
1.0000 [drp] | Freq: Two times a day (BID) | OPHTHALMIC | Status: DC
  Administered 2016-01-05 – 2016-01-07 (×6): 1 [drp] via OPHTHALMIC
  Filled 2016-01-05 (×2): qty 5

## 2016-01-05 MED ORDER — GLUCERNA SHAKE PO LIQD
237.0000 mL | ORAL | Status: DC
Start: 2016-01-05 — End: 2016-01-07
  Administered 2016-01-06 – 2016-01-07 (×2): 237 mL via ORAL
  Filled 2016-01-05 (×3): qty 237

## 2016-01-05 MED ORDER — PREMIER PROTEIN SHAKE
8.0000 [oz_av] | Freq: Two times a day (BID) | ORAL | Status: DC
  Administered 2016-01-05 – 2016-01-07 (×5): 8 [oz_av] via ORAL
  Filled 2016-01-05 (×3): qty 325.31

## 2016-01-05 MED ORDER — ACETAMINOPHEN 650 MG RE SUPP: 650.0000 mg | Freq: Four times a day (QID) | RECTAL | Status: DC | PRN

## 2016-01-05 MED ORDER — INSULIN ASPART 100 UNIT/ML ~~LOC~~ SOLN
0.0000 [IU] | Freq: Three times a day (TID) | SUBCUTANEOUS | Status: DC
  Administered 2016-01-05: 2 [IU] via SUBCUTANEOUS
  Administered 2016-01-05: 3 [IU] via SUBCUTANEOUS
  Administered 2016-01-05: 1 [IU] via SUBCUTANEOUS
  Administered 2016-01-06 (×2): 2 [IU] via SUBCUTANEOUS
  Administered 2016-01-06 – 2016-01-07 (×3): 3 [IU] via SUBCUTANEOUS

## 2016-01-05 MED ORDER — ONDANSETRON HCL 4 MG PO TABS: 4.0000 mg | ORAL_TABLET | Freq: Four times a day (QID) | ORAL | Status: DC | PRN

## 2016-01-05 MED ORDER — ENOXAPARIN SODIUM 40 MG/0.4ML ~~LOC~~ SOLN
40.0000 mg | SUBCUTANEOUS | Status: DC
  Administered 2016-01-05: 40 mg via SUBCUTANEOUS
  Filled 2016-01-05: qty 0.4

## 2016-01-05 NOTE — Progress Notes (Signed)
Patient Demographics  Brittany Schwartz, is a 65 y.o. female, DOB - 01/29/1951, [id number]  Admit date - 01/04/2016   Admitting Physician Edwin Dada, MD  Outpatient Primary MD for the patient is [name], MD  LOS -    Chief Complaint  Patient presents with  . Leg Swelling  . Urinary Retention       Admission HPI/Brief narrative: 65 y.o. female with past medical history of stage IIIa endometrial cancer, underwent robotic hysterectomy in 2/16, followed by chemotherapy and most recently vaginal brachy therapy, recent admission for malignant ascites, with paracentesis 2, as well as anemia requiring 2 units PRBC transfusion during previous admission, patient admitted overnight for worsening lower extremity edema and ascites. Subjective:   Brittany Schwartz today has, No headache, No chest pain, and planes of increased abdominal girth - No Nausea. Assessment & Plan    Active Problems:   Endometrial cancer ([place])   Hyponatremia   Essential hypertension   Anemia associated with chemotherapy   Metastatic cancer (HCC)   Non-insulin dependent type 2 diabetes mellitus (HCC)   Malignant ascites   AKI (acute kidney injury) ([place])   Elevated lactic acid level  Endometrial cancer , metastatic , malignant ascites - with extensive carcinomatosis consistent with metastatic disease and malignant ascites , rising CA125. -s/p ultrasound-guided paparacentesis on 1/28, 1/31, very likely will need paracentesis again, will schedule for tomorrow  - follow with Dr. Marko Plume as an outpatient, was supposed to start chemotherapy today, postponed for later.  Left hydronephrosis  - Renal function remains stable .   Hyponatremia - Secondary to volume overload, was on IV fluids during previous admission, received fluid bolus today as well, with worsening sodium, start on gentle diuresis .  Lower extremity  edema - Injury to volume overload and third spacing from hypoalbuminemia, will start on gentle diuresis, we'll place TED hose.   Diabetes mellitus (noninsulin dependent) -on sliding scale insulin , a1c 6.6  Essential hypertension -Continue atenolol,   Code Status: Full  Family Communication: Discussed with daughter via phone  Disposition Plan: Home once stable   Procedures  none   Condiscussed with Dr. Marko Plume over the phoneedications  Scheduled Meds: . enoxaparin (LOVENOX) injection  40 mg Subcutaneous Q24H  . feeding supplement (GLUCERNA SHAKE)  237 mL Oral Q24H  . ferrous fumarate  1 tablet Oral Daily  . furosemide  40 mg Intravenous Once  . insulin aspart  0-5 Units Subcutaneous QHS  . insulin aspart  0-9 Units Subcutaneous TID WC  . latanoprost  1 drop Both Eyes QHS  . pantoprazole  40 mg Oral Daily  . pravastatin  20 mg Oral Daily  . protein supplement shake  8 oz Oral BID BM  . timolol  1 drop Both Eyes BID   Continuous Infusions:  PRN Meds:.acetaminophen **OR** acetaminophen, ondansetron **OR** ondansetron (ZOFRAN) IV, oxyCODONE, sodium chloride flush  DVT Prophylaxis   SCDs   Lab Results  Component Value Date   PLT 203 01/05/2016    Antibiotics    Anti-infectives    None          Objective:   Filed Vitals:   01/04/16 2005 01/04/16 2303 01/05/16 0055 01/05/16 0141  BP: 153/73 145/71 118/81 124/57  Pulse: 110  110  101  Temp: 97.7 F (36.5 C)   97.5 F (36.4 C)  TempSrc: Oral   Oral  Resp: 20 16 15 16   Height:    5\' 1"  (1.549 m)  Weight:    72.5 kg (159 lb 13.3 oz)  SpO2: 100% 100% 100% 100%    Wt Readings from Last 3 Encounters:  01/05/16 72.5 kg (159 lb 13.3 oz)  12/29/15 64.411 kg (142 lb)  11/20/15 58.469 kg (128 lb 14.4 oz)     Intake/Output Summary (Last 24 hours) at 01/05/16 1225 Last data filed at 01/05/16 1135  Gross per 24 hour  Intake    322 ml  Output     30 ml  Net    292 ml     Physical Exam  Awake Alert,  Oriented X 3, No new F.N deficits, Normal affect [place].AT,PERRAL Supple Neck,No JVD,  Symmetrical Chest wall movement, Good air movement bilaterally RRR,No Gallops,Rubs or new Murmurs, No Parasternal Heave +ve B.Sounds, distended with ascites , No rebound - guarding or rigidity. No Cyanosis, Clubbing , +1  edema, No new Rash or bruise    Data Review   Micro Results Recent Results (from the past 240 hour(s))  Culture, blood (routine x 2)     Status: None (Preliminary result)   Collection Time: 01/05/16  2:26 AM  Result Value Ref Range Status   Specimen Description   Final    BLOOD BLOOD LEFT FOREARM Performed at [facility]    Special Requests IN PEDIATRIC BOTTLE 4ML  Final   Culture PENDING  Incomplete   Report Status PENDING  Incomplete    Radiology Reports Ct Abdomen Pelvis W Contrast  12/29/2015  CLINICAL DATA:  Bilateral upper abdominal pain for a week. History of endometrial cancer. The patient has completed chemotherapy and radiation therapy. Initial encounter. EXAM: CT ABDOMEN AND PELVIS WITH CONTRAST TECHNIQUE: Multidetector CT imaging of the abdomen and pelvis was performed using the standard protocol following bolus administration of intravenous contrast. CONTRAST:  80 mL OMNIPAQUE IOHEXOL 300 MG/ML  SOLN COMPARISON:  CT abdomen and pelvis 07/07/2015 FINDINGS: Mild dependent atelectasis is seen in the lung bases. Heart size is mildly enlarged. There is a trace right pleural effusion. No pericardial effusion or left pleural effusion. The patient has a new moderate to large volume of abdominal and pelvic ascites. Extensive omental caking is new since the prior examination and appears worst in the right mid abdomen. More masslike omental lesion is seen at the level of the right iliac wing and measures 6.1 cm AP by 3.3 cm transverse on image 62. Pelvic mass lesions are also identified. In the left hemipelvis, a lesion measuring approximately 6.3 cm AP by 2.9 cm transverse is  seen on image 78. The liver, gallbladder, spleen, adrenal glands, pancreas and right kidney appear normal. There is moderate left hydronephrosis with delayed excretion of contrast from the left kidney. 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 The patient is status post hysterectomy. No focal bony abnormality is identified. IMPRESSION: New extensive peritoneal carcinomatosis consistent with metastatic endometrial carcinoma with associated moderate to large volume of ascites, extensive omental caking and masses. Mass in the left pelvis results and obstruction of the left ureter and moderate hydronephrosis. These results were called by telephone at the time of interpretation on 12/29/2015 at 2:58 pm to Dr. Dalia Heading , who verbally acknowledged these results. Electronically Signed   By: Inge Rise M.D.  On: 12/29/2015 15:00   US Abdomen Limited  01/05/2016  CLINICAL DATA:  Evaluate for ascites.  Uterine cancer. EXAM: LIMITED ABDOMINAL ULTRASOUND COMPARISON:  None. FINDINGS: Moderate ascites is present in all 4 quadrants of the abdomen as well as the midline of the pelvis. IMPRESSION: Moderate ascites is present. Electronically Signed   By: Marybelle Killings M.D.   On: 01/05/2016 10:09   US Abdomen Limited  01/04/2016  CLINICAL DATA:  Evaluation for possible paracentesis EXAM: LIMITED ABDOMEN ULTRASOUND FOR ASCITES TECHNIQUE: Limited ultrasound survey for ascites was performed in all four abdominal quadrants. COMPARISON:  Abdominal ultrasound of January 31st 2017. FINDINGS: There is ascites in the 4 quadrants of the abdomen. The volume is deemed insufficient for successful paracentesis. IMPRESSION: There is ascites but insufficient for therapeutic paracentesis. Electronically Signed   By: David  Martinique M.D.   On: 01/04/2016 14:15   US Paracentesis  01/02/2016  INDICATION: Patient with history of grade 3 endometrial carcinoma, recurrent malignant ascites.  Request is made for therapeutic paracentesis. EXAM: ULTRASOUND GUIDED THERAPEUTIC PARACENTESIS MEDICATIONS: None. COMPLICATIONS: None immediate. PROCEDURE: Informed written consent was obtained from the patient after a discussion of the risks, benefits and alternatives to treatment. A timeout was performed prior to the initiation of the procedure. Initial ultrasound scanning demonstrates a moderate amount of ascites within the left mid to lower abdominal quadrant. The left mid to lower abdomen was prepped and draped in the usual sterile fashion. 1% lidocaine was used for local anesthesia. Following this, a Yueh catheter was introduced. An ultrasound image was saved for documentation purposes. The paracentesis was performed. The catheter was removed and a dressing was applied. The patient tolerated the procedure well without immediate post procedural complication. FINDINGS: A total of approximately 3.5 liters of bloody fluid was removed. IMPRESSION: Successful ultrasound-guided therapeutic paracentesis yielding 3.5 liters of peritoneal fluid. Read by: Rowe Robert, PA-C Electronically Signed   By: Lucrezia Europe M.D.   On: 01/02/2016 12:15   US Paracentesis  12/30/2015  [name], PA-C     12/30/2015  1:26 PM Successful US guided paracentesis from RUQ. Yielded 4L of bloody ascitic fluid. No immediate complications. Pt tolerated well. Specimen was sent for labs. [name] PA-C 12/30/2015 1:07 PM   Ir Cyndy Freeze Guide Cv Line Right  01/02/2016  CLINICAL DATA:  Endometrial carcinoma, needs long-term venous access for chemotherapy regimen. EXAM: TUNNELED PORT CATHETER PLACEMENT WITH ULTRASOUND AND FLUOROSCOPIC GUIDANCE FLUOROSCOPY TIME:  0.3 minutes, 41 uGym2 DAP ANESTHESIA/SEDATION: Intravenous Fentanyl and Versed were administered as conscious sedation during continuous monitoring of the patient's level of consciousness and physiological / cardiorespiratory status by the radiology RN, with a total moderate  sedation time of 14 minutes. TECHNIQUE: The procedure, risksbenefits, and alternatives were explained to the patient. Questions regarding the procedure were encouraged and answered. The patient understands and consents to the procedure. As antibiotic prophylaxis, cefazolin 2 g IV was ordered pre-procedure and administered intravenously within one hour of incision., Patency of the right IJ vein was confirmed with ultrasound with image documentation. An appropriate skin site was determined. Skin site was marked. Region was prepped using maximum barrier technique including cap and mask, sterile gown, sterile gloves, large sterile sheet, and Chlorhexidine as cutaneous antisepsis. The region was infiltrated locally with 1% lidocaine. Under real-time ultrasound guidance, the right IJ vein was accessed with a 21 gauge micropuncture needle; the needle tip within the vein was confirmed with ultrasound image documentation. Needle was exchanged over a 018 guidewire for transitional dilator  which allowed passage of the [facility] wire into the IVC. Over this, the transitional dilator was exchanged for a 5 [place] MPA catheter. A small incision was made on the right anterior chest wall and a subcutaneous pocket fashioned. The power-injectable port was positioned and its catheter tunneled to the right IJ dermatotomy site. The MPA catheter was exchanged over an Amplatz wire for a peel-away sheath, through which the port catheter, which had been trimmed to the appropriate length, was advanced and positioned under fluoroscopy with its tip at the cavoatrial junction. Spot chest radiograph confirms good catheter position and no pneumothorax. The pocket was closed with deep interrupted and subcuticular continuous 3-0 Monocryl sutures. The port was flushed per protocol. The incisions were covered with Dermabond then covered with a sterile dressing. COMPLICATIONS: COMPLICATIONS None immediate IMPRESSION: Technically successful right IJ  power-injectable port catheter placement. Ready for routine use. Electronically Signed   By: Lucrezia Europe M.D.   On: 01/02/2016 14:23   Ir US Guide Vasc Access Right  01/02/2016  CLINICAL DATA:  Endometrial carcinoma, needs long-term venous access for chemotherapy regimen. EXAM: TUNNELED PORT CATHETER PLACEMENT WITH ULTRASOUND AND FLUOROSCOPIC GUIDANCE FLUOROSCOPY TIME:  0.3 minutes, 41 uGym2 DAP ANESTHESIA/SEDATION: Intravenous Fentanyl and Versed were administered as conscious sedation during continuous monitoring of the patient's level of consciousness and physiological / cardiorespiratory status by the radiology RN, with a total moderate sedation time of 14 minutes. TECHNIQUE: The procedure, risksbenefits, and alternatives were explained to the patient. Questions regarding the procedure were encouraged and answered. The patient understands and consents to the procedure. As antibiotic prophylaxis, cefazolin 2 g IV was ordered pre-procedure and administered intravenously within one hour of incision., Patency of the right IJ vein was confirmed with ultrasound with image documentation. An appropriate skin site was determined. Skin site was marked. Region was prepped using maximum barrier technique including cap and mask, sterile gown, sterile gloves, large sterile sheet, and Chlorhexidine as cutaneous antisepsis. The region was infiltrated locally with 1% lidocaine. Under real-time ultrasound guidance, the right IJ vein was accessed with a 21 gauge micropuncture needle; the needle tip within the vein was confirmed with ultrasound image documentation. Needle was exchanged over a 018 guidewire for transitional dilator which allowed passage of the [facility] wire into the IVC. Over this, the transitional dilator was exchanged for a 5 [place] MPA catheter. A small incision was made on the right anterior chest wall and a subcutaneous pocket fashioned. The power-injectable port was positioned and its catheter tunneled to the  right IJ dermatotomy site. The MPA catheter was exchanged over an Amplatz wire for a peel-away sheath, through which the port catheter, which had been trimmed to the appropriate length, was advanced and positioned under fluoroscopy with its tip at the cavoatrial junction. Spot chest radiograph confirms good catheter position and no pneumothorax. The pocket was closed with deep interrupted and subcuticular continuous 3-0 Monocryl sutures. The port was flushed per protocol. The incisions were covered with Dermabond then covered with a sterile dressing. COMPLICATIONS: COMPLICATIONS None immediate IMPRESSION: Technically successful right IJ power-injectable port catheter placement. Ready for routine use. Electronically Signed   By: Lucrezia Europe M.D.   On: 01/02/2016 14:23     CBC  Recent Labs Lab 01/01/16 0430 01/02/16 0413 01/03/16 0440 01/04/16 0445 01/04/16 2108 01/05/16 0220  WBC 12.6* 11.8* 12.6*  --  14.9* 15.4*  HGB 9.0* 8.3* 7.7* 11.6* 11.6* 11.2*  HCT 25.9* 24.5* 22.3* 33.0* 32.7* 30.9*  PLT 346 319 287  --  215 203  MCV 86.9 86.9 87.1  --  85.4 84.7  MCH 30.2 29.4 30.1  --  30.3 30.7  MCHC 34.7 33.9 34.5  --  35.5 36.2*  RDW 12.6 12.6 13.0  --  13.1 13.0  LYMPHSABS  --   --   --   --  1.3  --   MONOABS  --   --   --   --  1.8*  --   EOSABS  --   --   --   --  0.1  --   BASOSABS  --   --   --   --  0.0  --     Chemistries   Recent Labs Lab 12/29/15 1242 12/30/15 0430  01/01/16 0430 01/02/16 0413 01/03/16 0440 01/04/16 2108 01/05/16 0220  NA 118* 118*  < > 122* 122* 125* 123* 121*  K 4.9 5.0  < > 4.4 4.4 4.3 4.3 4.5  CL 86* 89*  < > 97* 98* 103 98* 98*  CO2 21* 18*  < > 17* 18* 17* 19* 17*  GLUCOSE 178* 135*  < > 176* 165* 169* 177* 172*  BUN 16 17  < > 11 11 13 19 18   CREATININE 1.35* 1.34*  < > 1.30* 1.36* 1.33* 1.40* 1.23*  CALCIUM 9.0 8.2*  < > 7.4* 7.3* 6.8* 7.3* 7.2*  AST 21 18  --   --   --   --  17  --   ALT 14 12*  --   --   --   --  9*  --   ALKPHOS 44 36*   --   --   --   --  37*  --   BILITOT 0.5 0.6  --   --   --   --  0.7  --   < > = values in this interval not displayed. ------------------------------------------------------------------------------------------------------------------ estimated creatinine clearance is 41.5 mL/min (by C-G formula based on Cr of 1.23). ------------------------------------------------------------------------------------------------------------------ No results for input(s): HGBA1C in the last 72 hours. ------------------------------------------------------------------------------------------------------------------ No results for input(s): CHOL, HDL, LDLCALC, TRIG, CHOLHDL, LDLDIRECT in the last 72 hours. ------------------------------------------------------------------------------------------------------------------ No results for input(s): TSH, T4TOTAL, T3FREE, THYROIDAB in the last 72 hours.  Invalid input(s): FREET3 ------------------------------------------------------------------------------------------------------------------ No results for input(s): VITAMINB12, FOLATE, FERRITIN, TIBC, IRON, RETICCTPCT in the last 72 hours.  Coagulation profile  Recent Labs Lab 01/02/16 0413  INR 1.35    No results for input(s): DDIMER in the last 72 hours.  Cardiac Enzymes No results for input(s): CKMB, TROPONINI, MYOGLOBIN in the last 168 hours.  Invalid input(s): CK ------------------------------------------------------------------------------------------------------------------ Invalid input(s): POCBNP     Time Spent in minutes   No Sherlyn Lees, Ziyana Morikawa M.D on 01/05/2016 at 12:25 PM  Between 7am to 7pm - Pager - [phone]  After 7pm go to [email or web address] - password [email or web address]  [facility]   Office  [phone]

## 2016-01-05 NOTE — Telephone Encounter (Signed)
Per staff message and POF I have scheduled appts. Advised scheduler of appts. JMW  

## 2016-01-05 NOTE — Telephone Encounter (Signed)
S/w pt's spouse confirming labs/ov per 02/03 POF, sent msg to add chemo also.... KJ

## 2016-01-05 NOTE — ED Notes (Signed)
Report called to Sara, RN.

## 2016-01-06 ENCOUNTER — Ambulatory Visit: Payer: BLUE CROSS/BLUE SHIELD

## 2016-01-06 ENCOUNTER — Observation Stay (HOSPITAL_COMMUNITY): Payer: BLUE CROSS/BLUE SHIELD

## 2016-01-06 DIAGNOSIS — I10 Essential (primary) hypertension: Secondary | ICD-10-CM | POA: Diagnosis not present

## 2016-01-06 DIAGNOSIS — C541 Malignant neoplasm of endometrium: Secondary | ICD-10-CM | POA: Diagnosis not present

## 2016-01-06 DIAGNOSIS — E871 Hypo-osmolality and hyponatremia: Secondary | ICD-10-CM | POA: Diagnosis not present

## 2016-01-06 DIAGNOSIS — E119 Type 2 diabetes mellitus without complications: Secondary | ICD-10-CM

## 2016-01-06 DIAGNOSIS — R180 Malignant ascites: Secondary | ICD-10-CM | POA: Diagnosis not present

## 2016-01-06 LAB — BASIC METABOLIC PANEL
ANION GAP: 7 (ref 5–15)
Anion gap: 5 (ref 5–15)
BUN: 22 mg/dL — ABNORMAL HIGH (ref 6–20)
BUN: 24 mg/dL — AB (ref 6–20)
CALCIUM: 7.4 mg/dL — AB (ref 8.9–10.3)
CHLORIDE: 96 mmol/L — AB (ref 101–111)
CO2: 18 mmol/L — ABNORMAL LOW (ref 22–32)
CO2: 16 mmol/L — ABNORMAL LOW (ref 22–32)
Calcium: 7.2 mg/dL — ABNORMAL LOW (ref 8.9–10.3)
Chloride: 97 mmol/L — ABNORMAL LOW (ref 101–111)
Creatinine, Ser: 1.41 mg/dL — ABNORMAL HIGH (ref 0.44–1.00)
Creatinine, Ser: 1.43 mg/dL — ABNORMAL HIGH (ref 0.44–1.00)
GFR, EST AFRICAN AMERICAN: 44 mL/min — AB (ref >60)
GFR, EST AFRICAN AMERICAN: 43 mL/min — AB (ref >60)
GFR, EST NON AFRICAN AMERICAN: 38 mL/min — AB (ref >60)
GFR, EST NON AFRICAN AMERICAN: 38 mL/min — AB (ref >60)
Glucose, Bld: 174 mg/dL — ABNORMAL HIGH (ref 65–99)
Glucose, Bld: 199 mg/dL — ABNORMAL HIGH (ref 65–99)
POTASSIUM: 4.2 mmol/L (ref 3.5–5.1)
POTASSIUM: 4.2 mmol/L (ref 3.5–5.1)
SODIUM: 120 mmol/L — AB (ref 135–145)
SODIUM: 119 mmol/L — AB (ref 135–145)

## 2016-01-06 LAB — SODIUM: SODIUM: 118 mmol/L — AB (ref 135–145)

## 2016-01-06 LAB — GLUCOSE, CAPILLARY
GLUCOSE-CAPILLARY: 168 mg/dL — AB (ref 65–99)
GLUCOSE-CAPILLARY: 240 mg/dL — AB (ref 65–99)
GLUCOSE-CAPILLARY: 294 mg/dL — AB (ref 65–99)
Glucose-Capillary: 192 mg/dL — ABNORMAL HIGH (ref 65–99)

## 2016-01-06 MED ORDER — SODIUM CHLORIDE 1 G PO TABS
2.0000 g | ORAL_TABLET | Freq: Two times a day (BID) | ORAL | Status: DC
  Filled 2016-01-06 (×3): qty 2

## 2016-01-06 MED ORDER — TOLVAPTAN 15 MG PO TABS
15.0000 mg | ORAL_TABLET | Freq: Every day | ORAL | Status: DC
  Administered 2016-01-06: 15 mg via ORAL
  Filled 2016-01-06 (×2): qty 1

## 2016-01-06 MED ORDER — FUROSEMIDE 40 MG PO TABS
40.0000 mg | ORAL_TABLET | Freq: Every day | ORAL | Status: DC
  Administered 2016-01-06 – 2016-01-07 (×2): 40 mg via ORAL
  Filled 2016-01-06 (×2): qty 1

## 2016-01-06 NOTE — Progress Notes (Signed)
CRITICAL VALUE ALERT  Critical value received:  NA 119  Date of notification:  01/06/2016   Time of notification:  10:07 AM   Critical value read back:Yes.    Nurse who received alert:  Joaquin Courts   MD notified (1st page):  Dr Waldron Labs  Time of first page:  91  MD notified (2nd page):  Time of second page:  Responding MD:     Time MD responded:

## 2016-01-06 NOTE — Progress Notes (Signed)
Patient ID: Brittany Schwartz, female   DOB: Apr 24, 1951, 65 y.o.   MRN: [id number]   US guided paracentesis requested  Limited US abdomen performed VERY LITTLE fluid noted on [place] No safe window to gain access into small fluid pocket  No paracentesis performed

## 2016-01-06 NOTE — Progress Notes (Signed)
Patient Demographics  Brittany Schwartz, is a 65 y.o. female, DOB - [birth year], [id number]  Admit date - 01/04/2016   Admitting Physician Edwin Dada, MD  Outpatient Primary MD for the patient is [name], MD  LOS -    Chief Complaint  Patient presents with  . Leg Swelling  . Urinary Retention       Admission HPI/Brief narrative: 65 y.o. female with past medical history of stage IIIa endometrial cancer, underwent robotic hysterectomy in 2/16, followed by chemotherapy and most recently vaginal brachy therapy, recent admission for malignant ascites, with paracentesis 2, as well as anemia requiring 2 units PRBC transfusion during previous admission, patient  Was readmitted  for worsening lower extremity edema and ascites, as well worsening hyponatremia. Subjective:   Brittany Schwartz today has, No headache, No chest pain, and planes of increased abdominal girth - No Nausea. Assessment & Plan    Active Problems:   Endometrial cancer ([place])   Hyponatremia   Essential hypertension   Anemia associated with chemotherapy   Metastatic cancer (HCC)   Non-insulin dependent type 2 diabetes mellitus (HCC)   Malignant ascites   AKI (acute kidney injury) ([place])   Elevated lactic acid level  Endometrial cancer , metastatic , malignant ascites - with extensive carcinomatosis consistent with metastatic disease and malignant ascites , rising CA125. - s/p ultrasound-guided paparacentesis on 1/28, 1/31, very likely will need paracentesis again, attempted ultrasound-guided paracentesis by IR today, with very little fluid noticed on ultrasound, and no safe window to be an axis. - follow with Dr. Marko Plume as an outpatient, was supposed to start chemotherapy 2/3, has been postponed to 2/6.  Left hydronephrosis  - Renal function remains stable .  Hyponatremia - Secondary to volume overload, was on IV  fluids during previous admission, received fluid bolus on admission as well, received 1 dose of IV Lasix yesterday, with good urine output, mild bump in creatinine, nephrology consulted, will be seen by Dr. Posey Pronto, she will be started on tolvaptan.  Lower extremity edema - due to volume overload and third spacing from hypoalbuminemia,we'll place TED hose.   Diabetes mellitus (noninsulin dependent) -on sliding scale insulin , a1c 6.6  Essential hypertension - Acceptable, off medication.   Code Status: Full  Family Communication: Discussed with daughter at bedside.  Disposition Plan: Home once stable   Procedures  none   Condiscussed with Dr. Marko Plume over the phoneedications  Scheduled Meds: . feeding supplement (GLUCERNA SHAKE)  237 mL Oral Q24H  . ferrous fumarate  1 tablet Oral Daily  . insulin aspart  0-5 Units Subcutaneous QHS  . insulin aspart  0-9 Units Subcutaneous TID WC  . latanoprost  1 drop Both Eyes QHS  . pantoprazole  40 mg Oral Daily  . pravastatin  20 mg Oral Daily  . protein supplement shake  8 oz Oral BID BM  . timolol  1 drop Both Eyes BID  . tolvaptan  15 mg Oral Daily   Continuous Infusions:  PRN Meds:.acetaminophen **OR** acetaminophen, ondansetron **OR** ondansetron (ZOFRAN) IV, oxyCODONE, sodium chloride flush  DVT Prophylaxis   SCDs   Lab Results  Component Value Date   PLT 203 01/05/2016    Antibiotics    Anti-infectives    None  Objective:   Filed Vitals:   01/05/16 1530 01/05/16 2200 01/05/16 2214 01/06/16 0431  BP: 102/50 121/65  114/65  Pulse: 96 103 92 98  Temp: 97.5 F (36.4 C) 97.7 F (36.5 C)  98.1 F (36.7 C)  TempSrc: Oral Oral  Oral  Resp: 18 20  18   Height:      Weight:    74.1 kg (163 lb 5.8 oz)  SpO2: 100% 100%  100%    Wt Readings from Last 3 Encounters:  01/06/16 74.1 kg (163 lb 5.8 oz)  12/29/15 64.411 kg (142 lb)  11/20/15 58.469 kg (128 lb 14.4 oz)     Intake/Output Summary (Last 24  hours) at 01/06/16 1142 Last data filed at 01/06/16 0540  Gross per 24 hour  Intake 1610.23 ml  Output    500 ml  Net 1110.23 ml     Physical Exam  Awake Alert, Oriented X 3, No new F.N deficits, Normal affect [place].AT,PERRAL Supple Neck,No JVD,  Symmetrical Chest wall movement, Good air movement bilaterally RRR,No Gallops,Rubs or new Murmurs, No Parasternal Heave +ve B.Sounds, distended with ascites , No rebound - guarding or rigidity. No Cyanosis, Clubbing , +2  edema, No new Rash or bruise    Data Review   Micro Results Recent Results (from the past 240 hour(s))  Urine culture     Status: None   Collection Time: 01/04/16 10:50 AM  Result Value Ref Range Status   Specimen Description URINE, RANDOM  Final   Special Requests NONE  Final   Culture   Final    1,000 COLONIES/mL INSIGNIFICANT GROWTH Performed at [facility]    Report Status 01/05/2016 FINAL  Final  Culture, blood (routine x 2)     Status: None (Preliminary result)   Collection Time: 01/05/16  2:26 AM  Result Value Ref Range Status   Specimen Description   Final    BLOOD BLOOD LEFT FOREARM Performed at [facility]    Special Requests IN PEDIATRIC BOTTLE 4ML  Final   Culture PENDING  Incomplete   Report Status PENDING  Incomplete    Radiology Reports Ct Abdomen Pelvis W Contrast  12/29/2015  CLINICAL DATA:  Bilateral upper abdominal pain for a week. History of endometrial cancer. The patient has completed chemotherapy and radiation therapy. Initial encounter. EXAM: CT ABDOMEN AND PELVIS WITH CONTRAST TECHNIQUE: Multidetector CT imaging of the abdomen and pelvis was performed using the standard protocol following bolus administration of intravenous contrast. CONTRAST:  80 mL OMNIPAQUE IOHEXOL 300 MG/ML  SOLN COMPARISON:  CT abdomen and pelvis 07/07/2015 FINDINGS: Mild dependent atelectasis is seen in the lung bases. Heart size is mildly enlarged. There is a trace right pleural effusion. No  pericardial effusion or left pleural effusion. The patient has a new moderate to large volume of abdominal and pelvic ascites. Extensive omental caking is new since the prior examination and appears worst in the right mid abdomen. More masslike omental lesion is seen at the level of the right iliac wing and measures 6.1 cm AP by 3.3 cm transverse on image 62. Pelvic mass lesions are also identified. In the left hemipelvis, a lesion measuring approximately 6.3 cm AP by 2.9 cm transverse is seen on image 78. The liver, gallbladder, spleen, adrenal glands, pancreas and right kidney appear normal. There is moderate left hydronephrosis with delayed excretion of contrast from the left kidney. 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 The patient is status  post hysterectomy. No focal bony abnormality is identified. IMPRESSION: New extensive peritoneal carcinomatosis consistent with metastatic endometrial carcinoma with associated moderate to large volume of ascites, extensive omental caking and masses. Mass in the left pelvis results and obstruction of the left ureter and moderate hydronephrosis. These results were called by telephone at the time of interpretation on 12/29/2015 at 2:58 pm to Dr. Dalia Heading , who verbally acknowledged these results. Electronically Signed   By: Inge Rise M.D.   On: 12/29/2015 15:00   US Abdomen Limited  01/06/2016  CLINICAL DATA:  Evaluate for ascites. EXAM: LIMITED ABDOMEN ULTRASOUND FOR ASCITES TECHNIQUE: Limited ultrasound survey for ascites was performed in all four abdominal quadrants. COMPARISON:  None. FINDINGS: Small amount of ascites within the abdomen. Not enough ascites to perform paracentesis. IMPRESSION: Minimal amount ascites throughout the abdomen. Electronically Signed   By: Lovey Newcomer M.D.   On: 01/06/2016 11:16   US Abdomen Limited  01/05/2016  CLINICAL DATA:  Evaluate for ascites.  Uterine cancer. EXAM: LIMITED  ABDOMINAL ULTRASOUND COMPARISON:  None. FINDINGS: Moderate ascites is present in all 4 quadrants of the abdomen as well as the midline of the pelvis. IMPRESSION: Moderate ascites is present. Electronically Signed   By: Marybelle Killings M.D.   On: 01/05/2016 10:09   US Abdomen Limited  01/04/2016  CLINICAL DATA:  Evaluation for possible paracentesis EXAM: LIMITED ABDOMEN ULTRASOUND FOR ASCITES TECHNIQUE: Limited ultrasound survey for ascites was performed in all four abdominal quadrants. COMPARISON:  Abdominal ultrasound of January 31st 2017. FINDINGS: There is ascites in the 4 quadrants of the abdomen. The volume is deemed insufficient for successful paracentesis. IMPRESSION: There is ascites but insufficient for therapeutic paracentesis. Electronically Signed   By: David  Martinique M.D.   On: 01/04/2016 14:15   US Paracentesis  01/02/2016  INDICATION: Patient with history of grade 3 endometrial carcinoma, recurrent malignant ascites. Request is made for therapeutic paracentesis. EXAM: ULTRASOUND GUIDED THERAPEUTIC PARACENTESIS MEDICATIONS: None. COMPLICATIONS: None immediate. PROCEDURE: Informed written consent was obtained from the patient after a discussion of the risks, benefits and alternatives to treatment. A timeout was performed prior to the initiation of the procedure. Initial ultrasound scanning demonstrates a moderate amount of ascites within the left mid to lower abdominal quadrant. The left mid to lower abdomen was prepped and draped in the usual sterile fashion. 1% lidocaine was used for local anesthesia. Following this, a Yueh catheter was introduced. An ultrasound image was saved for documentation purposes. The paracentesis was performed. The catheter was removed and a dressing was applied. The patient tolerated the procedure well without immediate post procedural complication. FINDINGS: A total of approximately 3.5 liters of bloody fluid was removed. IMPRESSION: Successful ultrasound-guided  therapeutic paracentesis yielding 3.5 liters of peritoneal fluid. Read by: Rowe Robert, PA-C Electronically Signed   By: Lucrezia Europe M.D.   On: 01/02/2016 12:15   US Paracentesis  12/30/2015  [name], PA-C     12/30/2015  1:26 PM Successful US guided paracentesis from RUQ. Yielded 4L of bloody ascitic fluid. No immediate complications. Pt tolerated well. Specimen was sent for labs. [name] PA-C 12/30/2015 1:07 PM   Ir Cyndy Freeze Guide Cv Line Right  01/02/2016  CLINICAL DATA:  Endometrial carcinoma, needs long-term venous access for chemotherapy regimen. EXAM: TUNNELED PORT CATHETER PLACEMENT WITH ULTRASOUND AND FLUOROSCOPIC GUIDANCE FLUOROSCOPY TIME:  0.3 minutes, 41 uGym2 DAP ANESTHESIA/SEDATION: Intravenous Fentanyl and Versed were administered as conscious sedation during continuous monitoring of the patient's level of consciousness and  physiological / cardiorespiratory status by the radiology RN, with a total moderate sedation time of 14 minutes. TECHNIQUE: The procedure, risksbenefits, and alternatives were explained to the patient. Questions regarding the procedure were encouraged and answered. The patient understands and consents to the procedure. As antibiotic prophylaxis, cefazolin 2 g IV was ordered pre-procedure and administered intravenously within one hour of incision., Patency of the right IJ vein was confirmed with ultrasound with image documentation. An appropriate skin site was determined. Skin site was marked. Region was prepped using maximum barrier technique including cap and mask, sterile gown, sterile gloves, large sterile sheet, and Chlorhexidine as cutaneous antisepsis. The region was infiltrated locally with 1% lidocaine. Under real-time ultrasound guidance, the right IJ vein was accessed with a 21 gauge micropuncture needle; the needle tip within the vein was confirmed with ultrasound image documentation. Needle was exchanged over a 018 guidewire for transitional dilator which  allowed passage of the [facility] wire into the IVC. Over this, the transitional dilator was exchanged for a 5 [place] MPA catheter. A small incision was made on the right anterior chest wall and a subcutaneous pocket fashioned. The power-injectable port was positioned and its catheter tunneled to the right IJ dermatotomy site. The MPA catheter was exchanged over an Amplatz wire for a peel-away sheath, through which the port catheter, which had been trimmed to the appropriate length, was advanced and positioned under fluoroscopy with its tip at the cavoatrial junction. Spot chest radiograph confirms good catheter position and no pneumothorax. The pocket was closed with deep interrupted and subcuticular continuous 3-0 Monocryl sutures. The port was flushed per protocol. The incisions were covered with Dermabond then covered with a sterile dressing. COMPLICATIONS: COMPLICATIONS None immediate IMPRESSION: Technically successful right IJ power-injectable port catheter placement. Ready for routine use. Electronically Signed   By: Lucrezia Europe M.D.   On: 01/02/2016 14:23   Ir US Guide Vasc Access Right  01/02/2016  CLINICAL DATA:  Endometrial carcinoma, needs long-term venous access for chemotherapy regimen. EXAM: TUNNELED PORT CATHETER PLACEMENT WITH ULTRASOUND AND FLUOROSCOPIC GUIDANCE FLUOROSCOPY TIME:  0.3 minutes, 41 uGym2 DAP ANESTHESIA/SEDATION: Intravenous Fentanyl and Versed were administered as conscious sedation during continuous monitoring of the patient's level of consciousness and physiological / cardiorespiratory status by the radiology RN, with a total moderate sedation time of 14 minutes. TECHNIQUE: The procedure, risksbenefits, and alternatives were explained to the patient. Questions regarding the procedure were encouraged and answered. The patient understands and consents to the procedure. As antibiotic prophylaxis, cefazolin 2 g IV was ordered pre-procedure and administered intravenously within one hour  of incision., Patency of the right IJ vein was confirmed with ultrasound with image documentation. An appropriate skin site was determined. Skin site was marked. Region was prepped using maximum barrier technique including cap and mask, sterile gown, sterile gloves, large sterile sheet, and Chlorhexidine as cutaneous antisepsis. The region was infiltrated locally with 1% lidocaine. Under real-time ultrasound guidance, the right IJ vein was accessed with a 21 gauge micropuncture needle; the needle tip within the vein was confirmed with ultrasound image documentation. Needle was exchanged over a 018 guidewire for transitional dilator which allowed passage of the [facility] wire into the IVC. Over this, the transitional dilator was exchanged for a 5 [place] MPA catheter. A small incision was made on the right anterior chest wall and a subcutaneous pocket fashioned. The power-injectable port was positioned and its catheter tunneled to the right IJ dermatotomy site. The MPA catheter was exchanged over an Amplatz wire for  a peel-away sheath, through which the port catheter, which had been trimmed to the appropriate length, was advanced and positioned under fluoroscopy with its tip at the cavoatrial junction. Spot chest radiograph confirms good catheter position and no pneumothorax. The pocket was closed with deep interrupted and subcuticular continuous 3-0 Monocryl sutures. The port was flushed per protocol. The incisions were covered with Dermabond then covered with a sterile dressing. COMPLICATIONS: COMPLICATIONS None immediate IMPRESSION: Technically successful right IJ power-injectable port catheter placement. Ready for routine use. Electronically Signed   By: Lucrezia Europe M.D.   On: 01/02/2016 14:23     CBC  Recent Labs Lab 01/01/16 0430 01/02/16 0413 01/03/16 0440 01/04/16 0445 01/04/16 2108 01/05/16 0220  WBC 12.6* 11.8* 12.6*  --  14.9* 15.4*  HGB 9.0* 8.3* 7.7* 11.6* 11.6* 11.2*  HCT 25.9* 24.5* 22.3*  33.0* 32.7* 30.9*  PLT 346 319 287  --  215 203  MCV 86.9 86.9 87.1  --  85.4 84.7  MCH 30.2 29.4 30.1  --  30.3 30.7  MCHC 34.7 33.9 34.5  --  35.5 36.2*  RDW 12.6 12.6 13.0  --  13.1 13.0  LYMPHSABS  --   --   --   --  1.3  --   MONOABS  --   --   --   --  1.8*  --   EOSABS  --   --   --   --  0.1  --   BASOSABS  --   --   --   --  0.0  --     Chemistries   Recent Labs Lab 01/03/16 0440 01/04/16 2108 01/05/16 0220 01/06/16 0540 01/06/16 0900  NA 125* 123* 121* 120* 119*  K 4.3 4.3 4.5 4.2 4.2  CL 103 98* 98* 97* 96*  CO2 17* 19* 17* 18* 16*  GLUCOSE 169* 177* 172* 174* 199*  BUN 13 19 18  22* 24*  CREATININE 1.33* 1.40* 1.23* 1.41* 1.43*  CALCIUM 6.8* 7.3* 7.2* 7.4* 7.2*  AST  --  17  --   --   --   ALT  --  9*  --   --   --   ALKPHOS  --  37*  --   --   --   BILITOT  --  0.7  --   --   --    ------------------------------------------------------------------------------------------------------------------ estimated creatinine clearance is 36.1 mL/min (by C-G formula based on Cr of 1.43). ------------------------------------------------------------------------------------------------------------------ No results for input(s): HGBA1C in the last 72 hours. ------------------------------------------------------------------------------------------------------------------ No results for input(s): CHOL, HDL, LDLCALC, TRIG, CHOLHDL, LDLDIRECT in the last 72 hours. ------------------------------------------------------------------------------------------------------------------ No results for input(s): TSH, T4TOTAL, T3FREE, THYROIDAB in the last 72 hours.  Invalid input(s): FREET3 ------------------------------------------------------------------------------------------------------------------ No results for input(s): VITAMINB12, FOLATE, FERRITIN, TIBC, IRON, RETICCTPCT in the last 72 hours.  Coagulation profile  Recent Labs Lab 01/02/16 0413  INR 1.35    No results for  input(s): DDIMER in the last 72 hours.  Cardiac Enzymes No results for input(s): CKMB, TROPONINI, MYOGLOBIN in the last 168 hours.  Invalid input(s): CK ------------------------------------------------------------------------------------------------------------------ Invalid input(s): POCBNP     Time Spent in minutes   25 minutes   ELGERGAWY, DAWOOD M.D on 01/06/2016 at 11:42 AM  Between 7am to 7pm - Pager - [phone]  After 7pm go to [email or web address] - password [facility]  [facility]   Office  [phone]

## 2016-01-06 NOTE — Consult Note (Signed)
Reason for Consult: Hyponatremia Referring Physician: Wyline Beady M.D. [facility] [place])   HPI:  [name] with past medical history significant for hypertension, chronic kidney disease stage III (baseline creatinine 1.3-1.4) and stage IIIa endometrial cancer status post robotic hysterectomy, chemotherapy and vaginal brachytherapy. Recently discharged from the hospital after being admitted for worsening peritoneal carcinomatosis with malignant ascites-symptomatically improved with paracentesis. During that admission, she was noted to have hyponatremia that was managed with fluid restriction and loop diuretic. Unfortunately, she was readmitted to the hospital shortly after discharge with worsening pedal edema/thigh swelling and abdominal swelling/discomfort.  Concern is raised with her sodium levels since her admission that have paradoxically worsened following fluid restriction and intravenous furosemide. She and her family would like to be discharged prior to Monday so that she can have her chemotherapy administered as scheduled. She stopped taking her hydrochlorothiazide prior to her last hospitalization and denies regular/heavy NSAID use. She denies any compulsive polydipsia. She currently denies any chest pain or shortness of breath and reports easy fatigability but denies imbalance while walking. Her main complaint is that of tightness in her thighs/legs-collateral which has improved with furosemide.   Past Medical History  Diagnosis Date  . Hypertension   . Glaucoma   . Diabetes mellitus without complication ([place])   . Palpitations   . Muscle cramps   . Postmenopausal vaginal bleeding   . Uterine cancer [facility])     Past Surgical History  Procedure Laterality Date  . Robotic assisted total hysterectomy with bilateral salpingo oopherectomy Bilateral 01/19/2015    Procedure: XI ROBOTIC ASSISTED TOTAL HYSTERECTOMY WITH BILATERAL SALPINGO OOPHORECTOMY;  Surgeon: [name],  MD;  Location: [facility]S;  Service: Gynecology;  Laterality: Bilateral;    Family History  Problem Relation Age of Onset  . Diabetes Father   . Arrhythmia Mother     Social History:  reports that she has never smoked. She has never used smokeless tobacco. She reports that she does not drink alcohol or use illicit drugs.  Allergies: No Known Allergies  Medications:  Scheduled: . feeding supplement (GLUCERNA SHAKE)  237 mL Oral Q24H  . ferrous fumarate  1 tablet Oral Daily  . insulin aspart  0-5 Units Subcutaneous QHS  . insulin aspart  0-9 Units Subcutaneous TID WC  . latanoprost  1 drop Both Eyes QHS  . pantoprazole  40 mg Oral Daily  . pravastatin  20 mg Oral Daily  . protein supplement shake  8 oz Oral BID BM  . timolol  1 drop Both Eyes BID  . tolvaptan  15 mg Oral Daily    BMP Latest Ref Rng 01/06/2016 01/06/2016 01/05/2016  Glucose 65 - 99 mg/dL 199(H) 174(H) 172(H)  BUN 6 - 20 mg/dL 24(H) 22(H) 18  Creatinine 0.44 - 1.00 mg/dL 1.43(H) 1.41(H) 1.23(H)  Sodium 135 - 145 mmol/L 119(LL) 120(L) 121(L)  Potassium 3.5 - 5.1 mmol/L 4.2 4.2 4.5  Chloride 101 - 111 mmol/L 96(L) 97(L) 98(L)  CO2 22 - 32 mmol/L 16(L) 18(L) 17(L)  Calcium 8.9 - 10.3 mg/dL 7.2(L) 7.4(L) 7.2(L)    CBC Latest Ref Rng 01/05/2016 01/04/2016 01/04/2016  WBC 4.0 - 10.5 K/uL 15.4(H) 14.9(H) -  Hemoglobin 12.0 - 15.0 g/dL 11.2(L) 11.6(L) 11.6(L)  Hematocrit 36.0 - 46.0 % 30.9(L) 32.7(L) 33.0(L)  Platelets 150 - 400 K/uL 203 215 -       US Abdomen Limited  01/06/2016  CLINICAL DATA:  Evaluate for ascites. EXAM: LIMITED ABDOMEN ULTRASOUND FOR ASCITES TECHNIQUE: Limited ultrasound  survey for ascites was performed in all four abdominal quadrants. COMPARISON:  None. FINDINGS: Small amount of ascites within the abdomen. Not enough ascites to perform paracentesis. IMPRESSION: Minimal amount ascites throughout the abdomen. Electronically Signed   By: Lovey Newcomer M.D.   On: 01/06/2016 11:16   US Abdomen Limited  01/05/2016   CLINICAL DATA:  Evaluate for ascites.  Uterine cancer. EXAM: LIMITED ABDOMINAL ULTRASOUND COMPARISON:  None. FINDINGS: Moderate ascites is present in all 4 quadrants of the abdomen as well as the midline of the pelvis. IMPRESSION: Moderate ascites is present. Electronically Signed   By: Marybelle Killings M.D.   On: 01/05/2016 10:09   US Abdomen Limited  01/04/2016  CLINICAL DATA:  Evaluation for possible paracentesis EXAM: LIMITED ABDOMEN ULTRASOUND FOR ASCITES TECHNIQUE: Limited ultrasound survey for ascites was performed in all four abdominal quadrants. COMPARISON:  Abdominal ultrasound of January 31st 2017. FINDINGS: There is ascites in the 4 quadrants of the abdomen. The volume is deemed insufficient for successful paracentesis. IMPRESSION: There is ascites but insufficient for therapeutic paracentesis. Electronically Signed   By: David  Martinique M.D.   On: 01/04/2016 14:15    Review of Systems  Unable to perform ROS Constitutional: Positive for malaise/fatigue. Negative for fever and chills.  Eyes: Negative.   Respiratory: Positive for shortness of breath.        Shortness of breath on exertion  Cardiovascular: Negative.   Gastrointestinal: Positive for heartburn and abdominal pain. Negative for nausea, vomiting and diarrhea.  Genitourinary: Negative.   Musculoskeletal: Positive for back pain. Negative for myalgias and neck pain.  Skin: Negative.   Neurological: Positive for weakness. Negative for dizziness, tingling and focal weakness.   Blood pressure 114/65, pulse 98, temperature 98.1 F (36.7 C), temperature source Oral, resp. rate 18, height 5\' 1"  (1.549 m), weight 74.1 kg (163 lb 5.8 oz), SpO2 100 %. Physical Exam  Nursing note and vitals reviewed. Constitutional: She is oriented to person, place, and time. She appears well-developed and well-nourished. No distress.  HENT:  Head: Normocephalic and atraumatic.  Mouth/Throat: Oropharynx is clear and moist.  Eyes: Conjunctivae are normal.  Pupils are equal, round, and reactive to light. No scleral icterus.  Neck: Normal range of motion. Neck supple. No JVD present. No thyromegaly present.  Cardiovascular: Normal rate and regular rhythm.   Murmur heard. 3/6 ESM over apex  Respiratory: Effort normal.  GI: Bowel sounds are normal. She exhibits distension and mass. There is tenderness.  Firm to palpation, tenderness over right upper quadrant  Musculoskeletal: She exhibits edema.  2-3+ Thigh edema 1-2+ leg edema  Neurological: She is alert and oriented to person, place, and time.  Skin: Skin is warm and dry. No rash noted. No erythema.  Psychiatric: She has a normal mood and affect.    Assessment/Plan: 1.Hyponatremia:  serum osmolality 252, urine osmolality 776 with a urine sodium of 27-this picture represents inappropriate ADH secretion in a patient who has evidence of hypervolemic hyponatremia. Unfortunately, without impressive response to loop diuretic and continued drop in sodium levels. She is relatively asymptomatic (neurologically) from her hyponatremia and without indications for hypertonic saline. Discussed with Dr. Pollie Meyer and recommended initiation of Tolvaptan for aquaresis and raising sodium level while managing her hypervolemia. I will also restart furosemide 40 mg daily. No fluid restriction while she is on Tolvaptan. This is going to be a transient therapy in order to try and raise her sodium levels-I have discussed with her that we will not be using  it long-term.  2. Endometrial cancer stage IIIa: Awaiting chemotherapy on Monday status post hysterectomy/vaginal brachytherapy. 3. Edema/volume overload: It appears to be bi-factorial from hypoalbuminemia/poor oncotic pressure as well as probably impaired venous return with large pelvic mass. Attempt aquaretic/diuretic therapy/compression stockings. 4. Left-sided hydronephrosis: Previously seen on CT scan at her last admission-urology recommend observation/conservative  management as her renal function is unchanged and the mass is from compression effect from her pelvic mass (anticipated to improve with chemotherapy).  Espinola,Domenique Southers K. 01/06/2016, 12:41 PM

## 2016-01-06 NOTE — Progress Notes (Signed)
CRITICAL VALUE ALERT  Critical value received:  NA 118  Date of notification:  01/06/2016   Time of notification:  5:10 PM   Critical value read back:Yes.    Nurse who received alert:  Joaquin Courts   MD notified (1st page):  Dr Waldron Labs  Time of first page:  5:11 PM   MD notified (2nd page): Dr elgergawy  Time of second page: 1749  Responding MD:  Dr eldergawy  Time MD responded:  1800

## 2016-01-06 NOTE — Plan of Care (Signed)
Problem: Pain Managment: Goal: General experience of comfort will improve Outcome: Progressing Patient reports decrease in pain with prn

## 2016-01-07 DIAGNOSIS — I10 Essential (primary) hypertension: Secondary | ICD-10-CM | POA: Diagnosis not present

## 2016-01-07 DIAGNOSIS — C541 Malignant neoplasm of endometrium: Secondary | ICD-10-CM | POA: Diagnosis not present

## 2016-01-07 DIAGNOSIS — E871 Hypo-osmolality and hyponatremia: Secondary | ICD-10-CM | POA: Diagnosis not present

## 2016-01-07 DIAGNOSIS — R180 Malignant ascites: Secondary | ICD-10-CM | POA: Diagnosis not present

## 2016-01-07 LAB — GLUCOSE, CAPILLARY
Glucose-Capillary: 219 mg/dL — ABNORMAL HIGH (ref 65–99)
Glucose-Capillary: 224 mg/dL — ABNORMAL HIGH (ref 65–99)

## 2016-01-07 LAB — BASIC METABOLIC PANEL
Anion gap: 6 (ref 5–15)
BUN: 28 mg/dL — AB (ref 6–20)
CO2: 19 mmol/L — ABNORMAL LOW (ref 22–32)
Calcium: 7.7 mg/dL — ABNORMAL LOW (ref 8.9–10.3)
Chloride: 96 mmol/L — ABNORMAL LOW (ref 101–111)
Creatinine, Ser: 1.48 mg/dL — ABNORMAL HIGH (ref 0.44–1.00)
GFR calc Af Amer: 42 mL/min — ABNORMAL LOW (ref >60)
GFR, EST NON AFRICAN AMERICAN: 36 mL/min — AB (ref >60)
GLUCOSE: 182 mg/dL — AB (ref 65–99)
POTASSIUM: 4.2 mmol/L (ref 3.5–5.1)
Sodium: 121 mmol/L — ABNORMAL LOW (ref 135–145)

## 2016-01-07 LAB — CBC
HCT: 27.6 % — ABNORMAL LOW (ref 36.0–46.0)
Hemoglobin: 9.9 g/dL — ABNORMAL LOW (ref 12.0–15.0)
MCH: 30.6 pg (ref 26.0–34.0)
MCHC: 35.9 g/dL (ref 30.0–36.0)
MCV: 85.2 fL (ref 78.0–100.0)
PLATELETS: 234 10*3/uL (ref 150–400)
RBC: 3.24 MIL/uL — AB (ref 3.87–5.11)
RDW: 13 % (ref 11.5–15.5)
WBC: 13.8 10*3/uL — ABNORMAL HIGH (ref 4.0–10.5)

## 2016-01-07 LAB — SODIUM
SODIUM: 120 mmol/L — AB (ref 135–145)
Sodium: 124 mmol/L — ABNORMAL LOW (ref 135–145)

## 2016-01-07 MED ORDER — TOLVAPTAN 15 MG PO TABS
15.0000 mg | ORAL_TABLET | Freq: Every day | ORAL | Status: DC
  Administered 2016-01-07: 15 mg via ORAL
  Filled 2016-01-07: qty 1

## 2016-01-07 MED ORDER — FUROSEMIDE 40 MG PO TABS: 40.0000 mg | ORAL_TABLET | Freq: Two times a day (BID) | ORAL | Status: DC

## 2016-01-07 MED ORDER — LORAZEPAM 0.5 MG PO TABS: ORAL_TABLET | ORAL | Status: DC

## 2016-01-07 MED ORDER — TOLVAPTAN 15 MG PO TABS
15.0000 mg | ORAL_TABLET | Freq: Once | ORAL | Status: AC
  Administered 2016-01-07: 15 mg via ORAL
  Filled 2016-01-07: qty 1

## 2016-01-07 NOTE — Progress Notes (Signed)
Patient discharged home, all discharge medications and instructions reviewed and questions answered. Patient to be assisted to vehicle by wheelchair.  

## 2016-01-07 NOTE — Discharge Summary (Signed)
Brittany Schwartz, is a 65 y.o. female  DOB May 02, 1951  MRN [id number].  Admission date:  01/04/2016  Admitting Physician  Edwin Dada, MD  Discharge Date:  01/07/2016   Primary MD  [name], MD  Recommendations for primary care physician for things to follow:  - Please check CBC, BMP daily next visit, monitor closely as patient started on Lasix. - Patient to follow with Dr. Marko Plume in a.m. for chemotherapy - - Patient with known malignant ascites, will need paracentesis on as needed basis  Admission Diagnosis  Hyponatremia [E87.1]   Discharge Diagnosis  Hyponatremia [E87.1]    Active Problems:   Endometrial cancer ([place])   Hyponatremia   Essential hypertension   Anemia associated with chemotherapy   Metastatic cancer ([place])   Non-insulin dependent type 2 diabetes mellitus ([place])   Malignant ascites   AKI (acute kidney injury) ([place])   Elevated lactic acid level      Past Medical History  Diagnosis Date  . Hypertension   . Glaucoma   . Diabetes mellitus without complication ([place])   . Palpitations   . Muscle cramps   . Postmenopausal vaginal bleeding   . Uterine cancer [facility])     Past Surgical History  Procedure Laterality Date  . Robotic assisted total hysterectomy with bilateral salpingo oopherectomy Bilateral 01/19/2015    Procedure: XI ROBOTIC ASSISTED TOTAL HYSTERECTOMY WITH BILATERAL SALPINGO OOPHORECTOMY;  Surgeon: [name], MD;  Location: [facility];  Service: Gynecology;  Laterality: Bilateral;       History of present illness and  Hospital Course:     Kindly see H&P for history of present illness and admission details, please review complete Labs, Consult reports and Test reports for all details in brief  HPI  from the history and physical done on the day of admission 01/05/2016 HPI: Brittany Schwartz is a 65 y.o. female with a past medical history significant for  endometrial CA with recent worsening of peritoneal carcinomatosis with malignant ascites, NIDDM, and HTN who presents with leg swelling.  The patient was recently admitted from 1/27 until today with worsening adbominal pain, found to have new malignant ascites (and progression of peritoneal carcinomatosis), AKI, and hyponatremia. The ascites was tapped twice, the AKI remains stable but did not improve with fluids, and the hyponatremia improved only minimally with paracentesis and fluids. She had a port placed and was discharged today with plans for follow-up tomorrow for chemotherapy.   This afternoon, her daughter noticed that her legs were symmetrically swollen, and brought her back to the ER to be checked out.  In the ED, Na 123, Cr 1.4, WBC 14.9K, Hgb 11.6. She was tachycardic but afebrile. A lactic acid level was minimally elevated. TRH were asked to evaluate for admission.  The patient described bilaterally symmetric but painless leg swelling, new. She had no fever, flank pain, dysuria. She had no cough, sputum, dyspnea. She had no confusion, worsening abdominal pain. She was concerned that she wasn't making urine, had no desire to urinate.   Hospital  Course  65 y.o. female with past medical history of stage IIIa endometrial cancer, underwent robotic hysterectomy in 2/16, followed by chemotherapy and most recently vaginal brachy therapy, recent admission for malignant ascites, with paracentesis 2, as well as anemia requiring 2 units PRBC transfusion during previous admission, patient Was readmitted for worsening lower extremity edema and ascites, as well worsening hyponatremia.   Endometrial cancer , metastatic , malignant ascites - with extensive carcinomatosis consistent with metastatic disease and malignant ascites , rising CA125. - s/p ultrasound-guided paparacentesis on 1/28, 1/31, very likely will need paracentesis again on as needed patient as an outpatient, attempted  ultrasound-guided paracentesis by IR 01/06/2016, with very little fluid noticed on ultrasound, and no safe window for access. - follow with Dr. Marko Plume as an outpatient, was supposed to start chemotherapy 2/3, has been postponed to 2/6.  Left hydronephrosis  - Renal function remains stable .  Hyponatremia - serum osmolality 252, urine osmolality 776 with a urine sodium of 27-this picture represents inappropriate ADH secretion in a patient who has evidence of hypervolemic hyponatremia, seen by nephrology, patient was started on Samsca(tolvaptan), with good response, sodium as low as 118 during hospital stay, was 124 on discharge, she received an extra dose on discharge as per nephrology recommendation. - Started on Lasix 40 mg oral twice a day per nephrology recommendation for her hyperkalemia, hair BMP need to be monitored closely as an outpatient.  Lower extremity edema - due to volume overload and third spacing from hypoalbuminemia,placed on TED hose.   Diabetes mellitus (noninsulin dependent) -on sliding scale insulin  during hospital stay, a1c 6.6, resume home medication on discharge   Essential hypertension - Acceptable, off medication.   Discharge condition: stable   Follow UP  Follow-up Information    Follow up with Karan,DHAVAL, MD. Schedule an appointment as soon as possible for a visit in 1 week.   Specialty:  Endocrinology   Why:  Posthospitalization Follow-up   Contact information:   [id number] [place] [address] [phone]       Follow up with [name], MD.   Specialty:  Oncology   Contact information:   [place] [address] [phone]         Discharge Instructions  and  Discharge Medications     Discharge Instructions    Discharge instructions    Complete by:  As directed   Follow with Primary MD Packard,DHAVAL, MD in 7 days   Get CBC, CMP,  checked  by Primary MD next visit.    Activity: As  tolerated with Full fall precautions use walker/cane & assistance as needed   Disposition Home    Diet: Heart Healthy , carbohydrate modified , with feeding assistance and aspiration precautions.  For Heart failure patients - Check your Weight same time everyday, if you gain over 2 pounds, or you develop in leg swelling, experience more shortness of breath or chest pain, call your Primary MD immediately. Follow Cardiac Low Salt Diet and 1.5 lit/day fluid restriction.   On your next visit with your primary care physician please Get Medicines reviewed and adjusted.   Please request your Prim.MD to go over all Hospital Tests and Procedure/Radiological results at the follow up, please get all Hospital records sent to your Prim MD by signing hospital release before you go home.   If you experience worsening of your admission symptoms, develop shortness of breath, life threatening emergency, suicidal or homicidal thoughts you must seek  medical attention immediately by calling 911 or calling your MD immediately  if symptoms less severe.  You Must read complete instructions/literature along with all the possible adverse reactions/side effects for all the Medicines you take and that have been prescribed to you. Take any new Medicines after you have completely understood and accpet all the possible adverse reactions/side effects.   Do not drive, operating heavy machinery, perform activities at heights, swimming or participation in water activities or provide baby sitting services if your were admitted for syncope or siezures until you have seen by Primary MD or a Neurologist and advised to do so again.  Do not drive when taking Pain medications.    Do not take more than prescribed Pain, Sleep and Anxiety Medications  Special Instructions: If you have smoked or chewed Tobacco  in the last 2 yrs please stop smoking, stop any regular Alcohol  and or any Recreational drug use.  Wear Seat belts while  driving.   Please note  You were cared for by a hospitalist during your hospital stay. If you have any questions about your discharge medications or the care you received while you were in the hospital after you are discharged, you can call the unit and asked to speak with the hospitalist on call if the hospitalist that took care of you is not available. Once you are discharged, your primary care physician will handle any further medical issues. Please note that NO REFILLS for any discharge medications will be authorized once you are discharged, as it is imperative that you return to your primary care physician (or establish a relationship with a primary care physician if you do not have one) for your aftercare needs so that they can reassess your need for medications and monitor your lab values.     Increase activity slowly    Complete by:  As directed             Medication List    TAKE these medications        acetaminophen 325 MG tablet  Commonly known as:  TYLENOL  Take 650 mg by mouth every 6 (six) hours as needed. Reported on 11/20/2015     feeding supplement (GLUCERNA SHAKE) Liqd  Take 237 mLs by mouth daily.     ferrous fumarate 325 (106 Fe) MG Tabs tablet  Commonly known as:  HEMOCYTE - 106 mg FE  Take 1 tablet daily  on an empty stomach with OJ or vitamin C tablet     furosemide 40 MG tablet  Commonly known as:  LASIX  Take 1 tablet (40 mg total) by mouth 2 (two) times daily.     gabapentin 100 MG capsule  Commonly known as:  NEURONTIN  Take 1 capsule (100 mg total) by mouth at bedtime. Take 1 at bedtime for neuropathy     glipiZIDE 5 MG 24 hr tablet  Commonly known as:  GLUCOTROL XL  Take 5 mg by mouth 2 (two) times daily.     latanoprost 0.005 % ophthalmic solution  Commonly known as:  XALATAN  Place 1 drop into both eyes at bedtime.     lidocaine-prilocaine cream  Commonly known as:  EMLA  Apply to Porta-Cath 1-2 hrs prior to access as directed.      LORazepam 0.5 MG tablet  Commonly known as:  ATIVAN  Place 1 tablet under tongue or swallow every 6 hours as needed for nausea. Will make drowsy.     magnesium 30 MG tablet  Take 30 mg by mouth daily.     ondansetron 8 MG tablet  Commonly known as:  ZOFRAN  Take 1 tablet by mouth every 8 hours as needed for nausea. Will not make drowsy.     oxyCODONE 5 MG immediate release tablet  Commonly known as:  ROXICODONE  Take 1 tablet (5 mg total) by mouth every 6 (six) hours as needed for severe pain.     pantoprazole 40 MG tablet  Commonly known as:  PROTONIX  Take 1 tablet (40 mg total) by mouth daily.     pravastatin 20 MG tablet  Commonly known as:  PRAVACHOL  Take 20 mg by mouth daily.     prochlorperazine 10 MG tablet  Commonly known as:  COMPAZINE  Take 1 tablet (10 mg total) by mouth every 6 (six) hours as needed for nausea or vomiting.     protein supplement shake Liqd  Commonly known as:  PREMIER PROTEIN  Take 237 mLs (8 oz total) by mouth 2 (two) times daily between meals.     timolol 0.5 % ophthalmic solution  Commonly known as:  BETIMOL  Place 1 drop into both eyes 2 (two) times daily.          Diet and Activity recommendation: See Discharge Instructions above   Consults obtained -  Renal   Major procedures and Radiology Reports - PLEASE review detailed and final reports for all details, in brief -      Ct Abdomen Pelvis W Contrast  12/29/2015  CLINICAL DATA:  Bilateral upper abdominal pain for a week. History of endometrial cancer. The patient has completed chemotherapy and radiation therapy. Initial encounter. EXAM: CT ABDOMEN AND PELVIS WITH CONTRAST TECHNIQUE: Multidetector CT imaging of the abdomen and pelvis was performed using the standard protocol following bolus administration of intravenous contrast. CONTRAST:  80 mL OMNIPAQUE IOHEXOL 300 MG/ML  SOLN COMPARISON:  CT abdomen and pelvis 07/07/2015 FINDINGS: Mild dependent atelectasis is seen in the  lung bases. Heart size is mildly enlarged. There is a trace right pleural effusion. No pericardial effusion or left pleural effusion. The patient has a new moderate to large volume of abdominal and pelvic ascites. Extensive omental caking is new since the prior examination and appears worst in the right mid abdomen. More masslike omental lesion is seen at the level of the right iliac wing and measures 6.1 cm AP by 3.3 cm transverse on image 62. Pelvic mass lesions are also identified. In the left hemipelvis, a lesion measuring approximately 6.3 cm AP by 2.9 cm transverse is seen on image 78. The liver, gallbladder, spleen, adrenal glands, pancreas and right kidney appear normal. There is moderate left hydronephrosis with delayed excretion of contrast from the left kidney. 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 The patient is status post hysterectomy. No focal bony abnormality is identified. IMPRESSION: New extensive peritoneal carcinomatosis consistent with metastatic endometrial carcinoma with associated moderate to large volume of ascites, extensive omental caking and masses. Mass in the left pelvis results and obstruction of the left ureter and moderate hydronephrosis. These results were called by telephone at the time of interpretation on 12/29/2015 at 2:58 pm to Dr. Dalia Heading , who verbally acknowledged these results. Electronically Signed   By: Inge Rise M.D.   On: 12/29/2015 15:00   US Abdomen Limited  01/06/2016  CLINICAL DATA:  Evaluate for ascites. EXAM: LIMITED ABDOMEN ULTRASOUND FOR ASCITES TECHNIQUE: Limited ultrasound survey for ascites  was performed in all four abdominal quadrants. COMPARISON:  None. FINDINGS: Small amount of ascites within the abdomen. Not enough ascites to perform paracentesis. IMPRESSION: Minimal amount ascites throughout the abdomen. Electronically Signed   By: Lovey Newcomer M.D.   On: 01/06/2016 11:16   US Abdomen  Limited  01/05/2016  CLINICAL DATA:  Evaluate for ascites.  Uterine cancer. EXAM: LIMITED ABDOMINAL ULTRASOUND COMPARISON:  None. FINDINGS: Moderate ascites is present in all 4 quadrants of the abdomen as well as the midline of the pelvis. IMPRESSION: Moderate ascites is present. Electronically Signed   By: Marybelle Killings M.D.   On: 01/05/2016 10:09   US Abdomen Limited  01/04/2016  CLINICAL DATA:  Evaluation for possible paracentesis EXAM: LIMITED ABDOMEN ULTRASOUND FOR ASCITES TECHNIQUE: Limited ultrasound survey for ascites was performed in all four abdominal quadrants. COMPARISON:  Abdominal ultrasound of January 31st 2017. FINDINGS: There is ascites in the 4 quadrants of the abdomen. The volume is deemed insufficient for successful paracentesis. IMPRESSION: There is ascites but insufficient for therapeutic paracentesis. Electronically Signed   By: David  Martinique M.D.   On: 01/04/2016 14:15   US Paracentesis  01/02/2016  INDICATION: Patient with history of grade 3 endometrial carcinoma, recurrent malignant ascites. Request is made for therapeutic paracentesis. EXAM: ULTRASOUND GUIDED THERAPEUTIC PARACENTESIS MEDICATIONS: None. COMPLICATIONS: None immediate. PROCEDURE: Informed written consent was obtained from the patient after a discussion of the risks, benefits and alternatives to treatment. A timeout was performed prior to the initiation of the procedure. Initial ultrasound scanning demonstrates a moderate amount of ascites within the left mid to lower abdominal quadrant. The left mid to lower abdomen was prepped and draped in the usual sterile fashion. 1% lidocaine was used for local anesthesia. Following this, a Yueh catheter was introduced. An ultrasound image was saved for documentation purposes. The paracentesis was performed. The catheter was removed and a dressing was applied. The patient tolerated the procedure well without immediate post procedural complication. FINDINGS: A total of approximately  3.5 liters of bloody fluid was removed. IMPRESSION: Successful ultrasound-guided therapeutic paracentesis yielding 3.5 liters of peritoneal fluid. Read by: Rowe Robert, PA-C Electronically Signed   By: Lucrezia Europe M.D.   On: 01/02/2016 12:15   US Paracentesis  12/30/2015  [name], PA-C     12/30/2015  1:26 PM Successful US guided paracentesis from RUQ. Yielded 4L of bloody ascitic fluid. No immediate complications. Pt tolerated well. Specimen was sent for labs. [name] PA-C 12/30/2015 1:07 PM   Ir Cyndy Freeze Guide Cv Line Right  01/02/2016  CLINICAL DATA:  Endometrial carcinoma, needs long-term venous access for chemotherapy regimen. EXAM: TUNNELED PORT CATHETER PLACEMENT WITH ULTRASOUND AND FLUOROSCOPIC GUIDANCE FLUOROSCOPY TIME:  0.3 minutes, 41 uGym2 DAP ANESTHESIA/SEDATION: Intravenous Fentanyl and Versed were administered as conscious sedation during continuous monitoring of the patient's level of consciousness and physiological / cardiorespiratory status by the radiology RN, with a total moderate sedation time of 14 minutes. TECHNIQUE: The procedure, risksbenefits, and alternatives were explained to the patient. Questions regarding the procedure were encouraged and answered. The patient understands and consents to the procedure. As antibiotic prophylaxis, cefazolin 2 g IV was ordered pre-procedure and administered intravenously within one hour of incision., Patency of the right IJ vein was confirmed with ultrasound with image documentation. An appropriate skin site was determined. Skin site was marked. Region was prepped using maximum barrier technique including cap and mask, sterile gown, sterile gloves, large sterile sheet, and Chlorhexidine as cutaneous antisepsis. The region was infiltrated  locally with 1% lidocaine. Under real-time ultrasound guidance, the right IJ vein was accessed with a 21 gauge micropuncture needle; the needle tip within the vein was confirmed with ultrasound image  documentation. Needle was exchanged over a 018 guidewire for transitional dilator which allowed passage of the [facility] wire into the IVC. Over this, the transitional dilator was exchanged for a 5 [place] MPA catheter. A small incision was made on the right anterior chest wall and a subcutaneous pocket fashioned. The power-injectable port was positioned and its catheter tunneled to the right IJ dermatotomy site. The MPA catheter was exchanged over an Amplatz wire for a peel-away sheath, through which the port catheter, which had been trimmed to the appropriate length, was advanced and positioned under fluoroscopy with its tip at the cavoatrial junction. Spot chest radiograph confirms good catheter position and no pneumothorax. The pocket was closed with deep interrupted and subcuticular continuous 3-0 Monocryl sutures. The port was flushed per protocol. The incisions were covered with Dermabond then covered with a sterile dressing. COMPLICATIONS: COMPLICATIONS None immediate IMPRESSION: Technically successful right IJ power-injectable port catheter placement. Ready for routine use. Electronically Signed   By: Lucrezia Europe M.D.   On: 01/02/2016 14:23   Ir US Guide Vasc Access Right  01/02/2016  CLINICAL DATA:  Endometrial carcinoma, needs long-term venous access for chemotherapy regimen. EXAM: TUNNELED PORT CATHETER PLACEMENT WITH ULTRASOUND AND FLUOROSCOPIC GUIDANCE FLUOROSCOPY TIME:  0.3 minutes, 41 uGym2 DAP ANESTHESIA/SEDATION: Intravenous Fentanyl and Versed were administered as conscious sedation during continuous monitoring of the patient's level of consciousness and physiological / cardiorespiratory status by the radiology RN, with a total moderate sedation time of 14 minutes. TECHNIQUE: The procedure, risksbenefits, and alternatives were explained to the patient. Questions regarding the procedure were encouraged and answered. The patient understands and consents to the procedure. As antibiotic prophylaxis,  cefazolin 2 g IV was ordered pre-procedure and administered intravenously within one hour of incision., Patency of the right IJ vein was confirmed with ultrasound with image documentation. An appropriate skin site was determined. Skin site was marked. Region was prepped using maximum barrier technique including cap and mask, sterile gown, sterile gloves, large sterile sheet, and Chlorhexidine as cutaneous antisepsis. The region was infiltrated locally with 1% lidocaine. Under real-time ultrasound guidance, the right IJ vein was accessed with a 21 gauge micropuncture needle; the needle tip within the vein was confirmed with ultrasound image documentation. Needle was exchanged over a 018 guidewire for transitional dilator which allowed passage of the [facility] wire into the IVC. Over this, the transitional dilator was exchanged for a 5 [place] MPA catheter. A small incision was made on the right anterior chest wall and a subcutaneous pocket fashioned. The power-injectable port was positioned and its catheter tunneled to the right IJ dermatotomy site. The MPA catheter was exchanged over an Amplatz wire for a peel-away sheath, through which the port catheter, which had been trimmed to the appropriate length, was advanced and positioned under fluoroscopy with its tip at the cavoatrial junction. Spot chest radiograph confirms good catheter position and no pneumothorax. The pocket was closed with deep interrupted and subcuticular continuous 3-0 Monocryl sutures. The port was flushed per protocol. The incisions were covered with Dermabond then covered with a sterile dressing. COMPLICATIONS: COMPLICATIONS None immediate IMPRESSION: Technically successful right IJ power-injectable port catheter placement. Ready for routine use. Electronically Signed   By: Lucrezia Europe M.D.   On: 01/02/2016 14:23    Micro Results    Recent Results (from  the past 240 hour(s))  Urine culture     Status: None   Collection Time: 01/04/16 10:50  AM  Result Value Ref Range Status   Specimen Description URINE, RANDOM  Final   Special Requests NONE  Final   Culture   Final    1,000 COLONIES/mL INSIGNIFICANT GROWTH Performed at [facility]    Report Status 01/05/2016 FINAL  Final  Culture, blood (routine x 2)     Status: None (Preliminary result)   Collection Time: 01/05/16  2:20 AM  Result Value Ref Range Status   Specimen Description BLOOD LEFT ANTECUBITAL  Final   Special Requests IN PEDIATRIC BOTTLE 4ML  Final   Culture   Final    NO GROWTH 1 DAY Performed at [facility]    Report Status PENDING  Incomplete  Culture, blood (routine x 2)     Status: None (Preliminary result)   Collection Time: 01/05/16  2:26 AM  Result Value Ref Range Status   Specimen Description BLOOD BLOOD LEFT FOREARM  Final   Special Requests IN PEDIATRIC BOTTLE 4ML  Final   Culture   Final    NO GROWTH 1 DAY Performed at [facility]    Report Status PENDING  Incomplete       Today   Subjective:   Fayrene Sarti today has no headache,no chest  pain,no new weakness tingling or numbness, feels much better wants to go home today.   Objective:   Blood pressure 113/61, pulse 105, temperature 98.1 F (36.7 C), temperature source Oral, resp. rate 18, height 5\' 1"  (1.549 m), weight 74.1 kg (163 lb 5.8 oz), SpO2 98 %.   Intake/Output Summary (Last 24 hours) at 01/07/16 1435 Last data filed at 01/07/16 0916  Gross per 24 hour  Intake    480 ml  Output    300 ml  Net    180 ml    Exam Awake Alert, Oriented X 3, No new F.N deficits, Normal affect [place].AT,PERRAL Supple Neck,No JVD,  Symmetrical Chest wall movement, Good air movement bilaterally RRR,No Gallops,Rubs or new Murmurs, No Parasternal Heave +ve B.Sounds, distended with ascites , No rebound - guarding or rigidity. No Cyanosis, Clubbing , +2 edema, No new Rash or bruise  Data Review   CBC w Diff: Lab Results  Component Value Date   WBC 13.8* 01/07/2016    WBC 6.5 11/20/2015   HGB 9.9* 01/07/2016   HGB 9.6* 11/20/2015   HCT 27.6* 01/07/2016   HCT 29.1* 11/20/2015   PLT 234 01/07/2016   PLT 165 11/20/2015   LYMPHOPCT 9 01/04/2016   LYMPHOPCT 29.3 11/20/2015   MONOPCT 12 01/04/2016   MONOPCT 10.0 11/20/2015   EOSPCT 0 01/04/2016   EOSPCT 1.9 11/20/2015   BASOPCT 0 01/04/2016   BASOPCT 0.7 11/20/2015    CMP: Lab Results  Component Value Date   NA 124* 01/07/2016   NA 137 11/20/2015   K 4.2 01/07/2016   K 4.5 11/20/2015   CL 96* 01/07/2016   CO2 19* 01/07/2016   CO2 24 11/20/2015   BUN 28* 01/07/2016   BUN 12.1 11/20/2015   CREATININE 1.48* 01/07/2016   CREATININE 0.9 11/20/2015   GLU 347* 06/16/2015   PROT 4.2* 01/04/2016   PROT 6.8 11/20/2015   ALBUMIN 1.9* 01/04/2016   ALBUMIN 3.8 11/20/2015   BILITOT 0.7 01/04/2016   BILITOT 0.39 11/20/2015   ALKPHOS 37* 01/04/2016   ALKPHOS 64 11/20/2015   AST 17 01/04/2016   AST  16 11/20/2015   ALT 9* 01/04/2016   ALT 17 11/20/2015  .   Total Time in preparing paper work, data evaluation and todays exam - 35 minutes  ELGERGAWY, DAWOOD M.D on 01/07/2016 at 2:35 PM  [facility]   Office  [phone]

## 2016-01-07 NOTE — Discharge Instructions (Signed)
Follow with Primary MD Newsome,DHAVAL, MD in 7 days   Get CBC, CMP, y checked  by Primary MD next visit.    Activity: As tolerated with Full fall precautions use walker/cane & assistance as needed   Disposition Home    Diet: Heart Healthy , with feeding assistance and aspiration precautions.  For Heart failure patients - Check your Weight same time everyday, if you gain over 2 pounds, or you develop in leg swelling, experience more shortness of breath or chest pain, call your Primary MD immediately. Follow Cardiac Low Salt Diet and 1.5 lit/day fluid restriction.   On your next visit with your primary care physician please Get Medicines reviewed and adjusted.   Please request your Prim.MD to go over all Hospital Tests and Procedure/Radiological results at the follow up, please get all Hospital records sent to your Prim MD by signing hospital release before you go home.   If you experience worsening of your admission symptoms, develop shortness of breath, life threatening emergency, suicidal or homicidal thoughts you must seek medical attention immediately by calling 911 or calling your MD immediately  if symptoms less severe.  You Must read complete instructions/literature along with all the possible adverse reactions/side effects for all the Medicines you take and that have been prescribed to you. Take any new Medicines after you have completely understood and accpet all the possible adverse reactions/side effects.   Do not drive, operating heavy machinery, perform activities at heights, swimming or participation in water activities or provide baby sitting services if your were admitted for syncope or siezures until you have seen by Primary MD or a Neurologist and advised to do so again.  Do not drive when taking Pain medications.    Do not take more than prescribed Pain, Sleep and Anxiety Medications  Special Instructions: If you have smoked or chewed Tobacco  in the last 2 yrs please  stop smoking, stop any regular Alcohol  and or any Recreational drug use.  Wear Seat belts while driving.   Please note  You were cared for by a hospitalist during your hospital stay. If you have any questions about your discharge medications or the care you received while you were in the hospital after you are discharged, you can call the unit and asked to speak with the hospitalist on call if the hospitalist that took care of you is not available. Once you are discharged, your primary care physician will handle any further medical issues. Please note that NO REFILLS for any discharge medications will be authorized once you are discharged, as it is imperative that you return to your primary care physician (or establish a relationship with a primary care physician if you do not have one) for your aftercare needs so that they can reassess your need for medications and monitor your lab values.

## 2016-01-07 NOTE — Progress Notes (Signed)
Subjective:  Feels well- is making urine- sodium nadir of 118 now up to 121- given dose last night and this AM Objective Vital signs in last 24 hours: Filed Vitals:   01/05/16 2214 01/06/16 0431 01/06/16 1348 01/06/16 2207  BP:  114/65 107/54 108/58  Pulse: 92 98 101 102  Temp:  98.1 F (36.7 C) 98.5 F (36.9 C) 97.7 F (36.5 C)  TempSrc:  Oral Oral Oral  Resp:  18 18 18   Height:      Weight:  74.1 kg (163 lb 5.8 oz)    SpO2:  100% 100% 100%   Weight change:   Intake/Output Summary (Last 24 hours) at 01/07/16 1101 Last data filed at 01/06/16 1700  Gross per 24 hour  Intake    240 ml  Output    300 ml  Net    -60 ml    Assessment/Plan: 1.Hyponatremia: serum osmolality 252, urine osmolality 776 with a urine sodium of 27-this picture represents inappropriate ADH secretion in a patient who has evidence of hypervolemic hyponatremia. Unfortunately, without impressive response to loop diuretic and continued drop in sodium levels. She is relatively asymptomatic (neurologically) from her hyponatremia and without indications for hypertonic saline. Recommended initiation of Tolvaptan for aquaresis and raising sodium level while managing her hypervolemia. I will also restart furosemide 40 mg daily- will increase to BID for OP use.  This is going to be a transient therapy in order to try and raise her sodium levels- since has not had an overwhelming response I agree with checking a sodium right now or this PM and if not greater tha 125 would give another dose of samsca on the way out the door- also lasix 40 BID so she can be discharged and get chemo tomorrow  2. Endometrial cancer stage IIIa: Awaiting chemotherapy on Monday status post hysterectomy/vaginal brachytherapy. 3. Edema/volume overload: It appears to be bi-factorial from hypoalbuminemia/poor oncotic pressure as well as probably impaired venous return with large pelvic mass. Attempt aquaretic/diuretic therapy/compression stockings. 4.  Left-sided hydronephrosis: Previously seen on CT scan at her last admission-urology recommend observation/conservative management as her renal function is unchanged and the mass is from compression effect from her pelvic mass (anticipated to improve with chemotherapy).   Butch Otterson A    Labs: Basic Metabolic Panel:  Recent Labs Lab 01/06/16 0540 01/06/16 0900 01/06/16 1637 01/07/16 0100 01/07/16 0530  NA 120* 119* 118* 120* 121*  K 4.2 4.2  --   --  4.2  CL 97* 96*  --   --  96*  CO2 18* 16*  --   --  19*  GLUCOSE 174* 199*  --   --  182*  BUN 22* 24*  --   --  28*  CREATININE 1.41* 1.43*  --   --  1.48*  CALCIUM 7.4* 7.2*  --   --  7.7*   Liver Function Tests:  Recent Labs Lab 01/04/16 2108  AST 17  ALT 9*  ALKPHOS 37*  BILITOT 0.7  PROT 4.2*  ALBUMIN 1.9*   No results for input(s): LIPASE, AMYLASE in the last 168 hours. No results for input(s): AMMONIA in the last 168 hours. CBC:  Recent Labs Lab 01/02/16 0413 01/03/16 0440  01/04/16 2108 01/05/16 0220 01/07/16 0530  WBC 11.8* 12.6*  --  14.9* 15.4* 13.8*  NEUTROABS  --   --   --  11.7*  --   --   HGB 8.3* 7.7*  < > 11.6* 11.2* 9.9*  HCT 24.5*  22.3*  < > 32.7* 30.9* 27.6*  MCV 86.9 87.1  --  85.4 84.7 85.2  PLT 319 287  --  215 203 234  < > = values in this interval not displayed. Cardiac Enzymes: No results for input(s): CKTOTAL, CKMB, CKMBINDEX, TROPONINI in the last 168 hours. CBG:  Recent Labs Lab 01/06/16 0748 01/06/16 1131 01/06/16 1726 01/06/16 2205 01/07/16 0754  GLUCAP 168* 240* 192* 294* 219*    Iron Studies: No results for input(s): IRON, TIBC, TRANSFERRIN, FERRITIN in the last 72 hours. Studies/Results: US Abdomen Limited  01/06/2016  CLINICAL DATA:  Evaluate for ascites. EXAM: LIMITED ABDOMEN ULTRASOUND FOR ASCITES TECHNIQUE: Limited ultrasound survey for ascites was performed in all four abdominal quadrants. COMPARISON:  None. FINDINGS: Small amount of ascites within the  abdomen. Not enough ascites to perform paracentesis. IMPRESSION: Minimal amount ascites throughout the abdomen. Electronically Signed   By: Lovey Newcomer M.D.   On: 01/06/2016 11:16   Medications: Infusions:    Scheduled Medications: . feeding supplement (GLUCERNA SHAKE)  237 mL Oral Q24H  . ferrous fumarate  1 tablet Oral Daily  . furosemide  40 mg Oral Daily  . insulin aspart  0-5 Units Subcutaneous QHS  . insulin aspart  0-9 Units Subcutaneous TID WC  . latanoprost  1 drop Both Eyes QHS  . pantoprazole  40 mg Oral Daily  . pravastatin  20 mg Oral Daily  . protein supplement shake  8 oz Oral BID BM  . timolol  1 drop Both Eyes BID  . tolvaptan  15 mg Oral Daily    have reviewed scheduled and prn medications.  Physical Exam: General: NAD Heart: tachy Lungs: clear Abdomen: distended Extremities: pitting edema      01/07/2016,11:01 AM

## 2016-01-08 ENCOUNTER — Other Ambulatory Visit (HOSPITAL_BASED_OUTPATIENT_CLINIC_OR_DEPARTMENT_OTHER): Payer: BLUE CROSS/BLUE SHIELD

## 2016-01-08 ENCOUNTER — Ambulatory Visit (HOSPITAL_COMMUNITY)
Admission: RE | Admit: 2016-01-08 | Discharge: 2016-01-08 | Disposition: A | Discharge status: Home / Self Care | Payer: BLUE CROSS/BLUE SHIELD | Source: Ambulatory Visit | Attending: Oncology | Admitting: Oncology

## 2016-01-08 ENCOUNTER — Other Ambulatory Visit: Payer: Self-pay | Admitting: Oncology

## 2016-01-08 ENCOUNTER — Ambulatory Visit (HOSPITAL_BASED_OUTPATIENT_CLINIC_OR_DEPARTMENT_OTHER): Payer: BLUE CROSS/BLUE SHIELD | Admitting: Oncology

## 2016-01-08 ENCOUNTER — Encounter: Payer: Self-pay | Admitting: Oncology

## 2016-01-08 ENCOUNTER — Ambulatory Visit: Payer: BLUE CROSS/BLUE SHIELD

## 2016-01-08 VITALS — BP 149/71 | HR 108 | Temp 97.4°F | Resp 18 | Ht 61.0 in | Wt 164.3 lb

## 2016-01-08 DIAGNOSIS — Z23 Encounter for immunization: Secondary | ICD-10-CM

## 2016-01-08 DIAGNOSIS — Z90710 Acquired absence of both cervix and uterus: Secondary | ICD-10-CM | POA: Diagnosis not present

## 2016-01-08 DIAGNOSIS — E8809 Other disorders of plasma-protein metabolism, not elsewhere classified: Secondary | ICD-10-CM

## 2016-01-08 DIAGNOSIS — N1330 Unspecified hydronephrosis: Secondary | ICD-10-CM

## 2016-01-08 DIAGNOSIS — E871 Hypo-osmolality and hyponatremia: Secondary | ICD-10-CM

## 2016-01-08 DIAGNOSIS — E119 Type 2 diabetes mellitus without complications: Secondary | ICD-10-CM

## 2016-01-08 DIAGNOSIS — J9811 Atelectasis: Secondary | ICD-10-CM | POA: Insufficient documentation

## 2016-01-08 DIAGNOSIS — R609 Edema, unspecified: Secondary | ICD-10-CM

## 2016-01-08 DIAGNOSIS — Z9079 Acquired absence of other genital organ(s): Secondary | ICD-10-CM | POA: Insufficient documentation

## 2016-01-08 DIAGNOSIS — C786 Secondary malignant neoplasm of retroperitoneum and peritoneum: Secondary | ICD-10-CM | POA: Diagnosis not present

## 2016-01-08 DIAGNOSIS — R710 Precipitous drop in hematocrit: Secondary | ICD-10-CM | POA: Insufficient documentation

## 2016-01-08 DIAGNOSIS — K5900 Constipation, unspecified: Secondary | ICD-10-CM

## 2016-01-08 DIAGNOSIS — D500 Iron deficiency anemia secondary to blood loss (chronic): Secondary | ICD-10-CM | POA: Diagnosis not present

## 2016-01-08 DIAGNOSIS — R180 Malignant ascites: Secondary | ICD-10-CM

## 2016-01-08 DIAGNOSIS — I700 Atherosclerosis of aorta: Secondary | ICD-10-CM | POA: Insufficient documentation

## 2016-01-08 DIAGNOSIS — J90 Pleural effusion, not elsewhere classified: Secondary | ICD-10-CM | POA: Diagnosis not present

## 2016-01-08 DIAGNOSIS — C541 Malignant neoplasm of endometrium: Secondary | ICD-10-CM | POA: Diagnosis present

## 2016-01-08 DIAGNOSIS — J910 Malignant pleural effusion: Secondary | ICD-10-CM | POA: Diagnosis not present

## 2016-01-08 DIAGNOSIS — Z90722 Acquired absence of ovaries, bilateral: Secondary | ICD-10-CM | POA: Diagnosis not present

## 2016-01-08 DIAGNOSIS — D509 Iron deficiency anemia, unspecified: Secondary | ICD-10-CM

## 2016-01-08 DIAGNOSIS — E43 Unspecified severe protein-calorie malnutrition: Secondary | ICD-10-CM

## 2016-01-08 DIAGNOSIS — R188 Other ascites: Secondary | ICD-10-CM | POA: Diagnosis not present

## 2016-01-08 DIAGNOSIS — M7989 Other specified soft tissue disorders: Secondary | ICD-10-CM

## 2016-01-08 DIAGNOSIS — K219 Gastro-esophageal reflux disease without esophagitis: Secondary | ICD-10-CM

## 2016-01-08 LAB — COMPREHENSIVE METABOLIC PANEL
ALBUMIN: 1.7 g/dL — AB (ref 3.5–5.0)
ALK PHOS: 73 U/L (ref 40–150)
ALT: 11 U/L (ref 0–55)
ANION GAP: 10 meq/L (ref 3–11)
AST: 16 U/L (ref 5–34)
BILIRUBIN TOTAL: 0.71 mg/dL (ref 0.20–1.20)
BUN: 30 mg/dL — AB (ref 7.0–26.0)
CALCIUM: 8.3 mg/dL — AB (ref 8.4–10.4)
CO2: 16 mEq/L — ABNORMAL LOW (ref 22–29)
CREATININE: 1.6 mg/dL — AB (ref 0.6–1.1)
Chloride: 96 mEq/L — ABNORMAL LOW (ref 98–109)
EGFR: 33 mL/min/{1.73_m2} — AB (ref >90)
GLUCOSE: 260 mg/dL — AB (ref 70–140)
POTASSIUM: 4.7 meq/L (ref 3.5–5.1)
Total Protein: 5.1 g/dL — ABNORMAL LOW (ref 6.4–8.3)

## 2016-01-08 LAB — CBC WITH DIFFERENTIAL/PLATELET
BASO%: 0.4 % (ref 0.0–2.0)
BASOS ABS: 0.1 10*3/uL (ref 0.0–0.1)
EOS ABS: 0.1 10*3/uL (ref 0.0–0.5)
EOS%: 0.5 % (ref 0.0–7.0)
HEMATOCRIT: 30.2 % — AB (ref 34.8–46.6)
HEMOGLOBIN: 9.9 g/dL — AB (ref 11.6–15.9)
LYMPH%: 5.5 % — ABNORMAL LOW (ref 14.0–49.7)
MCH: 29.5 pg (ref 25.1–34.0)
MCHC: 32.9 g/dL (ref 31.5–36.0)
MCV: 89.6 fL (ref 79.5–101.0)
MONO#: 1.5 10*3/uL — AB (ref 0.1–0.9)
MONO%: 9.6 % (ref 0.0–14.0)
NEUT#: 13 10*3/uL — ABNORMAL HIGH (ref 1.5–6.5)
NEUT%: 84 % — ABNORMAL HIGH (ref 38.4–76.8)
PLATELETS: 242 10*3/uL (ref 145–400)
RBC: 3.37 10*6/uL — ABNORMAL LOW (ref 3.70–5.45)
RDW: 13.5 % (ref 11.2–14.5)
WBC: 15.5 10*3/uL — ABNORMAL HIGH (ref 3.9–10.3)
lymph#: 0.9 10*3/uL (ref 0.9–3.3)

## 2016-01-08 MED ORDER — IOHEXOL 300 MG/ML  SOLN
100.0000 mL | Freq: Once | INTRAMUSCULAR | Status: AC | PRN
  Administered 2016-01-08: 100 mL via INTRAVENOUS

## 2016-01-08 MED ORDER — INFLUENZA VAC SPLIT QUAD 0.5 ML IM SUSY
0.5000 mL | PREFILLED_SYRINGE | Freq: Once | INTRAMUSCULAR | Status: AC
  Administered 2016-01-08: 0.5 mL via INTRAMUSCULAR
  Filled 2016-01-08: qty 0.5

## 2016-01-08 MED ORDER — OXYCODONE HCL 5 MG PO TABS: ORAL_TABLET | ORAL | Status: DC

## 2016-01-08 NOTE — Progress Notes (Signed)
OFFICE PROGRESS NOTE   January 09, 2016   Physicians:[name]/ [name]; [name], MD (PCP, [facility]), Aloha Gell    INTERVAL HISTORY:  Patient is seen, together with daughter from [place] we are trying to manage rapidly and extensively recurrent endometrial carcinoma. She is seen before and again after urgent LE venous dopplers and urgent CT AP today.  She was hospitalized 1-27 thru 01-04-16 with new large volume malignant ascites and the progressive disease; she was readmitted on 01-05-16 particularly due to concerns about LE swelling, discharged again on 01-07-16. She had paracenteses for 4 liters bloody fluid on 12-29-14 and 3.5 liters on 01-02-16; [place] 2-4 identified only "small volume" fluid. She was hyponatremic in hospital, seen by nephrology during the readmission, given Samsca (tolvaptan) x2.  Plan is to try salvage adriamycin, tho first treatment was delayed with readmission and concerns today.  Echocardiogram in hospital 01-02-16 EF 65 - 70%, no pericardial effusion. PAC placed by IR 01-02-16. Flu vaccine given 01-08-16  Patient is using oxycodone 5 mg 3x daily for mostly upper abdominal pain. She had a small bowel movement on 01-06-16, none since; she took small amount of miralax x1 since DC. She is not vomiting. She can tolerate only small amounts po due to early satiety. She is drinking some gatorade, likes glucerna but has not used that (encouraged). She has large area of bruising left flank, noticed on 01-07-16; paracenteses were bilateral anterior abdomen, with tract on left leaking fluid for at least 24 hours following that procedure. She has had no other bleeding. LE swelling is slightly better with thigh high teds, no painful cords. Blood sugar was 176 this AM, and 213 yesterday. She is off diuretic and has not used lasix, which will not be helpful in moving ascites and will not be useful with albumin 1.7. No fever or symptoms of infection. Is voiding a little. No  trauma to left flank. No problems with PAC. Not more SOB with present minimal activity, no cough. Remainder of 10 point Review of Systems unchanged/ negative.    This daughter is nuclear medicine technologist in [place]   ONCOLOGIC HISTORY  Patient presented to Dr [name] 01-03-15 with vaginal spotting x 1 year. Pelvic US showed 33 mm endometrial stripe with ovaries normal, and endometrial biopsy also 01-03-15 reportedly had grade 3 endometrial adenocarcinoma. She was seen in consultation by Dr Denman George on 2-8-1. CT CAP 01-13-15 had negative chest, no adenopathy, mild fatty liver, markedly thickened endometrium and no evidence of metastatic disease outside of uterus. CA 125 preoperatively on 01-13-15 was 22.9. Surgery by Dr [name] 01-19-15 was robotic hysterectomy, BSO and resection of nodule at sigmoid serosa. At surgery there was milial tumor studding with 5-10 mm implants on bladder peritoneum and miliary disease on pelvic sidewalls, without upper abdominal disease evident, and omentum appeared normal. Pathology ([id number]) found IIIA grade 3 serous/ endometrioid endometrial carcinoma with 8 mm / 12 mm myometrial invasion (60%), + LVSI, and extensive serous involvement of sigmoid nodule. She was seen for post operative follow up by Dr Denman George on 02-03-15, with recommendation for 6 cycles of taxol carboplatin, then consideration of external beam RT + vaginal brachytherapy if CR. First carboplatin taxol was given 02-16-2015; she was neutropenic with ANC 0.7 by day 15 cycle 1. She was transfused 2 units PRBCs 5-19/5-20-16 for Hgb 7.8. Cycles 4 and 5 were both delayed due to thrombocytopenia. She completed chemotherapy 06-16-15 and vaginal brachytherapy30 gray in 5 fractions as of 09/14/2015. She did well thru end  of 11-2015, then presented to ED 12-29-15 with ~ 2 weeks progressive abdominal distension and pain, with large volume malignant ascites and large recurrent tumor burden. Cytology [id number] from ascites malignant  cells consistent with gyn adenocarcinoma. She was hospitalized thru 01-07-16, with paracentesis for 4 liters on 1-28 and for 3.5 liters on 01-02-16.     Objective:  Vital signs in last 24 hours:  BP 149/71 mmHg  Pulse 108  Temp(Src) 97.4 F (36.3 C) (Oral)  Resp 18  Ht 5' 1"  (1.549 m)  Wt 164 lb 4.8 oz (74.526 kg)  BMI 31.06 kg/m2  SpO2 100% Looks worse than when I saw her on 01-04-16 Alert, oriented, not in acute distress but obviously uncomfortable, appropriate. Looks more pale than lab suggests. Respirations not labored in WC on RA. Abdomen markedly distended, LE swelling with thigh high teds in place.  HEENT: Conjunctivae and mucous membranes pale. PERRL, sclerae not icteric. Oral mucosa moist without lesions, posterior pharynx clear.  Neck supple. No JVD.  Lymphatics:no cervical,supraclavicular adenopathy Resp: clear to auscultation bilaterally and normal percussion bilaterally Cardio: regular rate and rhythm, tachy. No gallop. GI: abdomen distended, full, nearly tight, only occasional bowel sounds. Extensive bruising over all of left flank. Site of paracentesis left lower abdomen anteriorly with slight bruising, not draining, not tender. Musculoskeletal/ Extremities: 2-3+ swelling bilateral LE to thighs without clear cords, tenderness, thigh high teds on Neuro: no peripheral neuropathy. Otherwise nonfocal Skin without rash, petechiae Portacath- incisions closed, without erythema or tenderness  Lab Results:  Results for orders placed or performed in visit on 01/08/16  CBC with Differential  Result Value Ref Range   WBC 15.5 (H) 3.9 - 10.3 10e3/uL   NEUT# 13.0 (H) 1.5 - 6.5 10e3/uL   HGB 9.9 (L) 11.6 - 15.9 g/dL   HCT 30.2 (L) 34.8 - 46.6 %   Platelets 242 145 - 400 10e3/uL   MCV 89.6 79.5 - 101.0 fL   MCH 29.5 25.1 - 34.0 pg   MCHC 32.9 31.5 - 36.0 g/dL   RBC 3.37 (L) 3.70 - 5.45 10e6/uL   RDW 13.5 11.2 - 14.5 %   lymph# 0.9 0.9 - 3.3 10e3/uL   MONO# 1.5 (H) 0.1 - 0.9  10e3/uL   Eosinophils Absolute 0.1 0.0 - 0.5 10e3/uL   Basophils Absolute 0.1 0.0 - 0.1 10e3/uL   NEUT% 84.0 (H) 38.4 - 76.8 %   LYMPH% 5.5 (L) 14.0 - 49.7 %   MONO% 9.6 0.0 - 14.0 %   EOS% 0.5 0.0 - 7.0 %   BASO% 0.4 0.0 - 2.0 %  Comprehensive metabolic panel  Result Value Ref Range   Sodium 122 Repeated and Verified (L) 136 - 145 mEq/L   Potassium 4.7 3.5 - 5.1 mEq/L   Chloride 96 (L) 98 - 109 mEq/L   CO2 16 (L) 22 - 29 mEq/L   Glucose 260 (H) 70 - 140 mg/dl   BUN 30.0 (H) 7.0 - 26.0 mg/dL   Creatinine 1.6 (H) 0.6 - 1.1 mg/dL   Total Bilirubin 0.71 0.20 - 1.20 mg/dL   Alkaline Phosphatase 73 40 - 150 U/L   AST 16 5 - 34 U/L   ALT 11 0 - 55 U/L   Total Protein 5.1 (L) 6.4 - 8.3 g/dL   Albumin 1.7 (L) 3.5 - 5.0 g/dL   Calcium 8.3 (L) 8.4 - 10.4 mg/dL   Anion Gap 10 3 - 11 mEq/L   EGFR 33 (L) >90 ml/min/1.73 m2   corrected Na ~  124 today  Studies/Results:  Bilateral LE venous dopplers done today negative for DVT per phone report to MD,  Final report pending.   Ct Abdomen Pelvis W Contrast  01/08/2016  CLINICAL DATA:  65 year old female with endometrial cancer status post TAHBSO on 01/19/2015, with recently diagnosed extensive peritoneal carcinomatosis requiring paracentesis x2, most recent on 01/02/2016. Patient presents with decreased hemoglobin and significant bruising. EXAM: CT ABDOMEN AND PELVIS WITH CONTRAST TECHNIQUE: Multidetector CT imaging of the abdomen and pelvis was performed using the standard protocol following bolus administration of intravenous contrast. CONTRAST:  171m OMNIPAQUE IOHEXOL 300 MG/ML  SOLN COMPARISON:  12/29/2015 CT abdomen/ pelvis. FINDINGS: Lower chest: Small layering right pleural effusion, new. Trace layering left pleural effusion, new. Associated passive atelectasis in the dependent lower lobes, right greater than left. The tip of a central venous catheter is seen at the cavoatrial junction. Hepatobiliary: Normal liver with no liver mass. Normal  gallbladder with no radiopaque cholelithiasis. No biliary ductal dilatation. Pancreas: Normal, with no mass or duct dilation. Spleen: Normal size. No mass. Adrenals/Urinary Tract: Normal adrenals. There is stable mild left hydroureteronephrosis to the level of the obstructing deep pelvic mass. There is new hyperdense material filling the left renal collecting system and left ureter. No right hydronephrosis. Normal caliber right ureter. Subcentimeter renal cortical lesion in the lateral upper left kidney is too small to characterize and unchanged. There is a persistent delayed left contrast nephrogram. Relatively collapsed bladder. There is direct invasion of the posterior bladder wall by the infiltrative 12.9 benign 0.3 cm peripherally enhancing mass centered at the vaginal cuff (series 2/image 78), which appears increased in size from 11.0 x 8.8 cm on 12/29/2015. Stomach/Bowel: Grossly normal stomach. Normal caliber small bowel with no small bowel wall thickening. Normal appendix. Normal large bowel with no diverticulosis, large bowel wall thickening or pericolonic fat stranding. Vascular/Lymphatic: Normal caliber atherosclerotic abdominal aorta. Patent portal, splenic, hepatic and renal veins. No pathologically enlarged lymph nodes in the abdomen or pelvis. Reproductive: Status post hysterectomy. Other: There is moderate volume ascites. Extensive omental tumor caking appears slightly increased. Large peritoneal tumor implant in the right pericolic gutter measuring 7.1 x 3.9 cm (series 2/ image 61) is mildly increased from 6.1 x 3.3 cm appear there is diffuse peritoneal thickening and irregularity in the pelvis, with the dominant mass at the vaginal cuff increased in size as detailed above. No evidence of a retroperitoneal or intraperitoneal hematoma. Musculoskeletal: No aggressive appearing focal osseous lesions. Mild-to-moderate degenerative changes in the visualized thoracolumbar spine. Worsening moderate  anasarca. IMPRESSION: 1. No evidence of a retroperitoneal or intraperitoneal hematoma. 2. Interval growth of dominant locally infiltrated pelvic tumor at the vaginal cuff, with persistent obstruction of the distal left pelvic ureter with stable mild left hydroureteronephrosis. New dense material within the left urinary tract is likely to represent delayed excretion of contrast material from the prior CT study, although a component of hemorrhage within the collecting system cannot be excluded. 3. Extensive peritoneal carcinomatosis, with evidence of interval worsening. Moderate volume ascites. 4. New small right and trace left pleural effusions and worsening moderate anasarca. Electronically Signed   By: JIlona SorrelM.D.   On: 01/08/2016 16:19   PACs images reviewed with patient and daughter ([place]   Medications: I have reviewed the patient's current medications. Increase miralax to full dose bid, add senokot S 2 tabs bid if miralax not adequate or if cannot tolerate that amount po. Oxycodone prescription given; discussed  need to use laxatives with pain medication. Has zofran, ativan, compazine available. Hold lasix, which she has not taken since DC; per DC summary this was also for elevated K Note Samsca (tolvaptan) x 2 during most recent hospitalization Patient agrees to flu vaccine now, given today.     DISCUSSION With question of LE DVTs and large flank bruising + [place] report of minimal ascites on 2-4 in setting of other abdominal findings, patient did not appear stable to proceed immediately with first adriamycin as scheduled at office today; instead, we were able to get urgent LE dopplers and CT AP as above.  First adriamycin rescheduled to first available on 2-8, tho will be worked in on 2-7 if cancellation allows. Have requested US paracentesis on 01-09-16 based on CT findings of moderate ascites, if possible. Likely the extensive bruising left  flank was tracking from paracentesis procedure LLQ on 01-02-16.  They understand that she does not have LE DVTs. Continue Teds and elevate LE as possible.  We have reviewed PACs images of the CT AP from today. I have been very clear with daughter and patient together that this is very aggressive recurrent malignancy. Surgery is not indicated and would not be helpful overall in this situation; Dr Denman George aware of situation tho patient also missed appointment with her last week. Chemotherapy will be given in attempt to slow and control the malignancy.  I have talked with them about life support, resuscitation and advance directives, explaining that resuscitation and life support in setting of extensive recurrent malignancy will not improve the underlying problem and thus not benefit overall. They have been given written information about Advance Directives and will discuss.  I have told them that even if decision is not to do resuscitation or life support, we can still try to improve the situation with interventions short of that. She and daughter do want to try chemotherapy, verball consent for adriamycin.  Medications including flu vaccine as above.  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 Continue to try to manage symptoms and initiate chemotherapy with first adriamycin 2-7 or 01-10-16; likely will need gCSF, may need IVF. I will see her 01-12-16 with labs. Therapeutic US paracentesis if possible on 2-7.  2.Code status and advance directives discussed, patient and family to consider 3. good LV function on echo done for adriamycin. Hx HTN, off diuretic 4.diabetes on oral agents: watching blood sugars, not excessively elevated now 5.hyponatremia:  physicians in hospital felt this inappropriate ADH and hypervolemia, tho fluid is third spaced in malignant ascites and in LE due to pelvic mass and low albumin. Post samsca, with slight improvement. Note she has been slightly hypoNa even previously, then thought with diuretic. At risk for renal compromise if hydration not maintained. Watch chemistries 6.constipation: pain medication and extent of tumor in abdomen and pelvis, tho no evidence of bowel obstruction. Increase laxatives and follow up 7.PAC in 8.unilateral hydronephrosis stable. Urology aware per ED at admission, tho no formal consult 9.extensive bruising left flank likely tracked from paracentsis site, no obvious retroperitoneal or other intraabdominal bleed by CT now 10.severe protein calorie malnutrition: discussed, encouraged sipping glucerna ongoing 11.flu vaccine given ( patient previously having refused this) after discussion and with patient's consent 12. GERD improved with protonix previously 13.anemia: most blood loss now with the bloody malignant  ascites. Was transfused 2 units in hospital, may need additional.  All questions answered. Time spent >60 min including >50% counseling and coordination of care. Chemo orders confirmed. Cc PCP and Dr Bartholomew Boards, MD   01/09/2016, 8:34 AM

## 2016-01-08 NOTE — Progress Notes (Signed)
VASCULAR LAB PRELIMINARY  PRELIMINARY  PRELIMINARY  PRELIMINARY   Bilateral lower extremity venous Doppler has been completed.  Bilateral:  No evidence of DVT, superficial thrombosis, or Baker's Cyst.   Janifer Adie, RVT, RDMS 01/08/2016, 2:52 PM

## 2016-01-08 NOTE — Progress Notes (Signed)
  Called results to Dr. Livesay@240  pm.

## 2016-01-09 ENCOUNTER — Ambulatory Visit (HOSPITAL_COMMUNITY)
Admission: RE | Admit: 2016-01-09 | Discharge: 2016-01-09 | Disposition: A | Discharge status: Home / Self Care | Payer: BLUE CROSS/BLUE SHIELD | Source: Ambulatory Visit | Attending: Oncology | Admitting: Oncology

## 2016-01-09 ENCOUNTER — Telehealth: Payer: Self-pay | Admitting: Oncology

## 2016-01-09 ENCOUNTER — Other Ambulatory Visit: Payer: Self-pay | Admitting: Oncology

## 2016-01-09 DIAGNOSIS — C541 Malignant neoplasm of endometrium: Secondary | ICD-10-CM | POA: Diagnosis not present

## 2016-01-09 DIAGNOSIS — E43 Unspecified severe protein-calorie malnutrition: Secondary | ICD-10-CM | POA: Insufficient documentation

## 2016-01-09 DIAGNOSIS — R180 Malignant ascites: Secondary | ICD-10-CM | POA: Diagnosis not present

## 2016-01-09 NOTE — Telephone Encounter (Signed)
Spoke with patient re appointments for 2/8 and 2/10 - patient to get new schedule 2/10. Message to MW/MM re adding IVF's after 2/10 f/u.

## 2016-01-09 NOTE — Procedures (Signed)
Ultrasound-guided therapeutic paracentesis performed yielding 4 liters of bloody colored fluid. No immediate complications.  Brittany Schwartz E 11:28 AM 01/09/2016

## 2016-01-10 ENCOUNTER — Other Ambulatory Visit (HOSPITAL_BASED_OUTPATIENT_CLINIC_OR_DEPARTMENT_OTHER): Payer: BLUE CROSS/BLUE SHIELD

## 2016-01-10 ENCOUNTER — Ambulatory Visit (HOSPITAL_BASED_OUTPATIENT_CLINIC_OR_DEPARTMENT_OTHER): Payer: BLUE CROSS/BLUE SHIELD

## 2016-01-10 ENCOUNTER — Other Ambulatory Visit: Payer: Self-pay

## 2016-01-10 ENCOUNTER — Ambulatory Visit (HOSPITAL_COMMUNITY)
Admission: RE | Admit: 2016-01-10 | Discharge: 2016-01-10 | Disposition: A | Discharge status: Home / Self Care | Payer: BLUE CROSS/BLUE SHIELD | Source: Ambulatory Visit | Attending: Oncology | Admitting: Oncology

## 2016-01-10 ENCOUNTER — Telehealth: Payer: Self-pay

## 2016-01-10 VITALS — BP 114/60 | HR 99 | Temp 98.2°F | Resp 18

## 2016-01-10 DIAGNOSIS — C541 Malignant neoplasm of endometrium: Secondary | ICD-10-CM

## 2016-01-10 DIAGNOSIS — D649 Anemia, unspecified: Secondary | ICD-10-CM | POA: Diagnosis not present

## 2016-01-10 DIAGNOSIS — Z5111 Encounter for antineoplastic chemotherapy: Secondary | ICD-10-CM | POA: Diagnosis not present

## 2016-01-10 LAB — CULTURE, BLOOD (ROUTINE X 2)
Culture: NO GROWTH
Culture: NO GROWTH

## 2016-01-10 LAB — CBC WITH DIFFERENTIAL/PLATELET
BASO%: 0.2 % (ref 0.0–2.0)
Basophils Absolute: 0 10*3/uL (ref 0.0–0.1)
EOS%: 0.2 % (ref 0.0–7.0)
Eosinophils Absolute: 0 10*3/uL (ref 0.0–0.5)
HCT: 26.8 % — ABNORMAL LOW (ref 34.8–46.6)
HGB: 8.6 g/dL — ABNORMAL LOW (ref 11.6–15.9)
LYMPH%: 4.9 % — ABNORMAL LOW (ref 14.0–49.7)
MCH: 28.7 pg (ref 25.1–34.0)
MCHC: 32.1 g/dL (ref 31.5–36.0)
MCV: 89.6 fL (ref 79.5–101.0)
MONO#: 1.3 10*3/uL — AB (ref 0.1–0.9)
MONO%: 8.8 % (ref 0.0–14.0)
NEUT#: 13.1 10*3/uL — ABNORMAL HIGH (ref 1.5–6.5)
NEUT%: 85.9 % — AB (ref 38.4–76.8)
PLATELETS: 245 10*3/uL (ref 145–400)
RBC: 2.99 10*6/uL — ABNORMAL LOW (ref 3.70–5.45)
RDW: 13.5 % (ref 11.2–14.5)
WBC: 15.2 10*3/uL — ABNORMAL HIGH (ref 3.9–10.3)
lymph#: 0.7 10*3/uL — ABNORMAL LOW (ref 0.9–3.3)

## 2016-01-10 LAB — PREPARE RBC (CROSSMATCH)

## 2016-01-10 MED ORDER — PALONOSETRON HCL INJECTION 0.25 MG/5ML
0.2500 mg | Freq: Once | INTRAVENOUS | Status: AC
  Administered 2016-01-10: 0.25 mg via INTRAVENOUS

## 2016-01-10 MED ORDER — SODIUM CHLORIDE 0.9 % IV SOLN
Freq: Once | INTRAVENOUS | Status: AC
  Administered 2016-01-10: 15:00:00 via INTRAVENOUS
  Filled 2016-01-10: qty 5

## 2016-01-10 MED ORDER — SODIUM CHLORIDE 0.9% FLUSH
10.0000 mL | INTRAVENOUS | Status: DC | PRN
  Administered 2016-01-10: 10 mL
  Filled 2016-01-10: qty 10

## 2016-01-10 MED ORDER — DOXORUBICIN HCL CHEMO IV INJECTION 2 MG/ML
50.0000 mg/m2 | Freq: Once | INTRAVENOUS | Status: AC
  Administered 2016-01-10: 84 mg via INTRAVENOUS
  Filled 2016-01-10: qty 42

## 2016-01-10 MED ORDER — HEPARIN SOD (PORK) LOCK FLUSH 100 UNIT/ML IV SOLN
500.0000 [IU] | Freq: Once | INTRAVENOUS | Status: AC | PRN
  Administered 2016-01-10: 500 [IU]
  Filled 2016-01-10: qty 5

## 2016-01-10 MED ORDER — PALONOSETRON HCL INJECTION 0.25 MG/5ML
INTRAVENOUS | Status: AC
  Filled 2016-01-10: qty 5

## 2016-01-10 MED ORDER — SODIUM CHLORIDE 0.9 % IV SOLN
Freq: Once | INTRAVENOUS | Status: AC
  Administered 2016-01-10: 14:00:00 via INTRAVENOUS

## 2016-01-10 NOTE — Telephone Encounter (Signed)
-----   Message from [name], MD sent at 01/10/2016  3:02 PM EST ----- If possible, needs 1 unit PRBCs 2-10 at [facility],  Or 1-2 units this Thurs 2-9 or Sat 2-11 if cannot do it on 2-10  I am still not clear how to put in orders for blood for sickle cell clinic - may need to show me, or RN can put in orders:  each unit over 2 hrs, premed tylenol 325 mg  If possible still keep paracentesis on 2-10 in case she needs it  Thank you

## 2016-01-10 NOTE — Telephone Encounter (Signed)
Not able to schedule IVF's at [facility] 2/10. Fluids will be given at [facility] 2/10 @ 12 pm after visit w/LL. Also per desk nurse added Blood for 2/11 @ [facility] - nurse/LL will discuss with patient at 2/10 visit. Per nurse she will send pof re blood 2/11.

## 2016-01-10 NOTE — Patient Instructions (Addendum)
[facility] Discharge Instructions for Patients Receiving Chemotherapy  Today you received the following chemotherapy agents Adriamycin.  To help prevent nausea and vomiting after your treatment, we encourage you to take your nausea medications as outlined on the sheet that pharmacy provided and you AVS.    If you develop nausea and vomiting that is not controlled by your nausea medication, call the clinic.   BELOW ARE SYMPTOMS THAT SHOULD BE REPORTED IMMEDIATELY:  *FEVER GREATER THAN 100.5 F  *CHILLS WITH OR WITHOUT FEVER  NAUSEA AND VOMITING THAT IS NOT CONTROLLED WITH YOUR NAUSEA MEDICATION  *UNUSUAL SHORTNESS OF BREATH  *UNUSUAL BRUISING OR BLEEDING  TENDERNESS IN MOUTH AND THROAT WITH OR WITHOUT PRESENCE OF ULCERS  *URINARY PROBLEMS  *BOWEL PROBLEMS  UNUSUAL RASH Items with * indicate a potential emergency and should be followed up as soon as possible.  Feel free to call the clinic you have any questions or concerns. The clinic phone number is [phone].  Please show the [facility] at check-in to the Emergency Department and triage nurse.   Doxorubicin injection What is this medicine? DOXORUBICIN (dox oh ROO bi sin) is a chemotherapy drug. It is used to treat many kinds of cancer like Hodgkin's disease, leukemia, non-Hodgkin's lymphoma, neuroblastoma, sarcoma, and Wilms' tumor. It is also used to treat bladder cancer, breast cancer, lung cancer, ovarian cancer, stomach cancer, and thyroid cancer. This medicine may be used for other purposes; ask your health care provider or pharmacist if you have questions. What should I tell my health care provider before I take this medicine? They need to know if you have any of these conditions: -blood disorders -heart disease, recent heart attack -infection (especially a virus infection such as chickenpox, cold sores, or herpes) -irregular heartbeat -liver disease -recent or ongoing radiation  therapy -an unusual or allergic reaction to doxorubicin, other chemotherapy agents, other medicines, foods, dyes, or preservatives -pregnant or trying to get pregnant -breast-feeding How should I use this medicine? This drug is given as an infusion into a vein. It is administered in a hospital or clinic by a specially trained health care professional. If you have pain, swelling, burning or any unusual feeling around the site of your injection, tell your health care professional right away. Talk to your pediatrician regarding the use of this medicine in children. Special care may be needed. Overdosage: If you think you have taken too much of this medicine contact a poison control center or emergency room at once. NOTE: This medicine is only for you. Do not share this medicine with others. What if I miss a dose? It is important not to miss your dose. Call your doctor or health care professional if you are unable to keep an appointment. What may interact with this medicine? Do not take this medicine with any of the following medications: -cisapride -droperidol -halofantrine -pimozide -zidovudine This medicine may also interact with the following medications: -chloroquine -chlorpromazine -clarithromycin -cyclophosphamide -cyclosporine -erythromycin -medicines for depression, anxiety, or psychotic disturbances -medicines for irregular heart beat like amiodarone, bepridil, dofetilide, encainide, flecainide, propafenone, quinidine -medicines for seizures like ethotoin, fosphenytoin, phenytoin -medicines for nausea, vomiting like dolasetron, ondansetron, palonosetron -medicines to increase blood counts like filgrastim, pegfilgrastim, sargramostim -methadone -methotrexate -pentamidine -progesterone -vaccines -verapamil Talk to your doctor or health care professional before taking any of these medicines: -acetaminophen -aspirin -ibuprofen -ketoprofen -naproxen This list may not  describe all possible interactions. Give your health care provider a list of all the medicines, herbs,  non-prescription drugs, or dietary supplements you use. Also tell them if you smoke, drink alcohol, or use illegal drugs. Some items may interact with your medicine. What should I watch for while using this medicine? Your condition will be monitored carefully while you are receiving this medicine. You will need important blood work done while you are taking this medicine. This drug may make you feel generally unwell. This is not uncommon, as chemotherapy can affect healthy cells as well as cancer cells. Report any side effects. Continue your course of treatment even though you feel ill unless your doctor tells you to stop. Your urine may turn red for a few days after your dose. This is not blood. If your urine is dark or brown, call your doctor. In some cases, you may be given additional medicines to help with side effects. Follow all directions for their use. Call your doctor or health care professional for advice if you get a fever, chills or sore throat, or other symptoms of a cold or flu. Do not treat yourself. This drug decreases your body's ability to fight infections. Try to avoid being around people who are sick. This medicine may increase your risk to bruise or bleed. Call your doctor or health care professional if you notice any unusual bleeding. Be careful brushing and flossing your teeth or using a toothpick because you may get an infection or bleed more easily. If you have any dental work done, tell your dentist you are receiving this medicine. Avoid taking products that contain aspirin, acetaminophen, ibuprofen, naproxen, or ketoprofen unless instructed by your doctor. These medicines may hide a fever. Men and women of childbearing age should use effective birth control methods while using taking this medicine. Do not become pregnant while taking this medicine. There is a potential for  serious side effects to an unborn child. Talk to your health care professional or pharmacist for more information. Do not breast-feed an infant while taking this medicine. Do not let others touch your urine or other body fluids for 5 days after each treatment with this medicine. Caregivers should wear latex gloves to avoid touching body fluids during this time. There is a maximum amount of this medicine you should receive throughout your life. The amount depends on the medical condition being treated and your overall health. Your doctor will watch how much of this medicine you receive in your lifetime. Tell your doctor if you have taken this medicine before. What side effects may I notice from receiving this medicine? Side effects that you should report to your doctor or health care professional as soon as possible: -allergic reactions like skin rash, itching or hives, swelling of the face, lips, or tongue -low blood counts - this medicine may decrease the number of white blood cells, red blood cells and platelets. You may be at increased risk for infections and bleeding. -signs of infection - fever or chills, cough, sore throat, pain or difficulty passing urine -signs of decreased platelets or bleeding - bruising, pinpoint red spots on the skin, black, tarry stools, blood in the urine -signs of decreased red blood cells - unusually weak or tired, fainting spells, lightheadedness -breathing problems -chest pain -fast, irregular heartbeat -mouth sores -nausea, vomiting -pain, swelling, redness at site where injected -pain, tingling, numbness in the hands or feet -swelling of ankles, feet, or hands -unusual bleeding or bruising Side effects that usually do not require medical attention (report to your doctor or health care professional if they continue or are  bothersome): -diarrhea -facial flushing -hair loss -loss of appetite -missed menstrual periods -nail discoloration or damage -red or  watery eyes -red colored urine -stomach upset This list may not describe all possible side effects. Call your doctor for medical advice about side effects. You may report side effects to FDA at 1-800-FDA-1088. Where should I keep my medicine? This drug is given in a hospital or clinic and will not be stored at home. NOTE: This sheet is a summary. It may not cover all possible information. If you have questions about this medicine, talk to your doctor, pharmacist, or health care provider.    2016, [employer]. (2013-03-16 09:54:34)

## 2016-01-10 NOTE — Progress Notes (Signed)
OK to treat with labs from 01/07/17 per Louise/ Dr. Vassie Moselle. Positive blood return prior, during and after Adriamycin push. Also, I reinforced the need for her Blue Blood band to remain on her arm and explained that we would match the numbers on her blue band with the unit of blood.

## 2016-01-10 NOTE — Telephone Encounter (Signed)
Told Brittany Schwartz that Brittany Schwartz is scheduled for 2 units of blood tomorrow at the [facility].  Gave directions from [facility] main entrance as pt. Needs WC. Told him the appointments for Friday remain the same: lab, Dr. Verneda Skill possible IVF if needed at [facility] and then [facility] [place] for paracentesis. Cancelled blood for Sat. 01-13-16.  Orders for blood in computer and T&C done today.  Bowels did move better daughter said while here earlier with mother after her treatment.  Felt better yesterday after the 4 liters taken off abdomen.  Slept better,however, abdomen is firmer already this afternoon.  Zofran prescription sent 01-02-16 does have 1 refill.

## 2016-01-11 ENCOUNTER — Telehealth: Payer: Self-pay

## 2016-01-11 ENCOUNTER — Emergency Department (HOSPITAL_COMMUNITY): Payer: BLUE CROSS/BLUE SHIELD

## 2016-01-11 ENCOUNTER — Other Ambulatory Visit: Payer: BLUE CROSS/BLUE SHIELD

## 2016-01-11 ENCOUNTER — Inpatient Hospital Stay (HOSPITAL_COMMUNITY)
Admission: EM | Admit: 2016-01-11 | Discharge: 2016-01-30 | DRG: 871 | Disposition: A | Discharge status: Skilled Nursing Facility | Payer: BLUE CROSS/BLUE SHIELD | Attending: Internal Medicine | Admitting: Internal Medicine

## 2016-01-11 ENCOUNTER — Ambulatory Visit (HOSPITAL_COMMUNITY)
Admission: RE | Admit: 2016-01-11 | Discharge: 2016-01-11 | Disposition: A | Discharge status: Home / Self Care | Payer: BLUE CROSS/BLUE SHIELD | Source: Ambulatory Visit | Attending: Oncology | Admitting: Oncology

## 2016-01-11 ENCOUNTER — Encounter (HOSPITAL_COMMUNITY): Payer: Self-pay | Admitting: *Deleted

## 2016-01-11 ENCOUNTER — Ambulatory Visit: Payer: BLUE CROSS/BLUE SHIELD | Admitting: Oncology

## 2016-01-11 VITALS — BP 166/98 | HR 87 | Temp 97.5°F | Resp 26

## 2016-01-11 DIAGNOSIS — S301XXA Contusion of abdominal wall, initial encounter: Secondary | ICD-10-CM | POA: Diagnosis present

## 2016-01-11 DIAGNOSIS — X58XXXA Exposure to other specified factors, initial encounter: Secondary | ICD-10-CM | POA: Diagnosis present

## 2016-01-11 DIAGNOSIS — T451X5A Adverse effect of antineoplastic and immunosuppressive drugs, initial encounter: Secondary | ICD-10-CM | POA: Diagnosis present

## 2016-01-11 DIAGNOSIS — J9601 Acute respiratory failure with hypoxia: Secondary | ICD-10-CM

## 2016-01-11 DIAGNOSIS — Z7189 Other specified counseling: Secondary | ICD-10-CM | POA: Diagnosis not present

## 2016-01-11 DIAGNOSIS — N135 Crossing vessel and stricture of ureter without hydronephrosis: Secondary | ICD-10-CM | POA: Diagnosis present

## 2016-01-11 DIAGNOSIS — Z79899 Other long term (current) drug therapy: Secondary | ICD-10-CM

## 2016-01-11 DIAGNOSIS — R059 Cough, unspecified: Secondary | ICD-10-CM

## 2016-01-11 DIAGNOSIS — E872 Acidosis: Secondary | ICD-10-CM | POA: Diagnosis present

## 2016-01-11 DIAGNOSIS — Z4659 Encounter for fitting and adjustment of other gastrointestinal appliance and device: Secondary | ICD-10-CM

## 2016-01-11 DIAGNOSIS — C799 Secondary malignant neoplasm of unspecified site: Secondary | ICD-10-CM | POA: Diagnosis present

## 2016-01-11 DIAGNOSIS — D649 Anemia, unspecified: Secondary | ICD-10-CM

## 2016-01-11 DIAGNOSIS — A419 Sepsis, unspecified organism: Secondary | ICD-10-CM | POA: Diagnosis present

## 2016-01-11 DIAGNOSIS — K209 Esophagitis, unspecified: Secondary | ICD-10-CM | POA: Diagnosis present

## 2016-01-11 DIAGNOSIS — I959 Hypotension, unspecified: Secondary | ICD-10-CM | POA: Diagnosis present

## 2016-01-11 DIAGNOSIS — N1330 Unspecified hydronephrosis: Secondary | ICD-10-CM | POA: Diagnosis present

## 2016-01-11 DIAGNOSIS — K1230 Oral mucositis (ulcerative), unspecified: Secondary | ICD-10-CM

## 2016-01-11 DIAGNOSIS — R34 Anuria and oliguria: Secondary | ICD-10-CM | POA: Diagnosis present

## 2016-01-11 DIAGNOSIS — K121 Other forms of stomatitis: Secondary | ICD-10-CM

## 2016-01-11 DIAGNOSIS — E1165 Type 2 diabetes mellitus with hyperglycemia: Secondary | ICD-10-CM | POA: Diagnosis present

## 2016-01-11 DIAGNOSIS — R0600 Dyspnea, unspecified: Secondary | ICD-10-CM | POA: Diagnosis present

## 2016-01-11 DIAGNOSIS — K5900 Constipation, unspecified: Secondary | ICD-10-CM | POA: Diagnosis present

## 2016-01-11 DIAGNOSIS — R0602 Shortness of breath: Secondary | ICD-10-CM | POA: Insufficient documentation

## 2016-01-11 DIAGNOSIS — E871 Hypo-osmolality and hyponatremia: Secondary | ICD-10-CM | POA: Diagnosis present

## 2016-01-11 DIAGNOSIS — C801 Malignant (primary) neoplasm, unspecified: Secondary | ICD-10-CM | POA: Diagnosis not present

## 2016-01-11 DIAGNOSIS — J189 Pneumonia, unspecified organism: Secondary | ICD-10-CM | POA: Diagnosis present

## 2016-01-11 DIAGNOSIS — N179 Acute kidney failure, unspecified: Secondary | ICD-10-CM | POA: Diagnosis not present

## 2016-01-11 DIAGNOSIS — Z515 Encounter for palliative care: Secondary | ICD-10-CM | POA: Diagnosis not present

## 2016-01-11 DIAGNOSIS — R188 Other ascites: Secondary | ICD-10-CM

## 2016-01-11 DIAGNOSIS — N170 Acute kidney failure with tubular necrosis: Secondary | ICD-10-CM | POA: Diagnosis present

## 2016-01-11 DIAGNOSIS — J910 Malignant pleural effusion: Secondary | ICD-10-CM | POA: Diagnosis not present

## 2016-01-11 DIAGNOSIS — R6521 Severe sepsis with septic shock: Secondary | ICD-10-CM | POA: Diagnosis present

## 2016-01-11 DIAGNOSIS — B370 Candidal stomatitis: Secondary | ICD-10-CM | POA: Diagnosis not present

## 2016-01-11 DIAGNOSIS — R1310 Dysphagia, unspecified: Secondary | ICD-10-CM | POA: Diagnosis not present

## 2016-01-11 DIAGNOSIS — N189 Chronic kidney disease, unspecified: Secondary | ICD-10-CM | POA: Diagnosis not present

## 2016-01-11 DIAGNOSIS — Z8542 Personal history of malignant neoplasm of other parts of uterus: Secondary | ICD-10-CM | POA: Diagnosis not present

## 2016-01-11 DIAGNOSIS — C541 Malignant neoplasm of endometrium: Secondary | ICD-10-CM

## 2016-01-11 DIAGNOSIS — D61810 Antineoplastic chemotherapy induced pancytopenia: Secondary | ICD-10-CM | POA: Diagnosis not present

## 2016-01-11 DIAGNOSIS — G620 Drug-induced polyneuropathy: Secondary | ICD-10-CM | POA: Diagnosis present

## 2016-01-11 DIAGNOSIS — Z01818 Encounter for other preprocedural examination: Secondary | ICD-10-CM

## 2016-01-11 DIAGNOSIS — Z7984 Long term (current) use of oral hypoglycemic drugs: Secondary | ICD-10-CM

## 2016-01-11 DIAGNOSIS — G9341 Metabolic encephalopathy: Secondary | ICD-10-CM | POA: Diagnosis present

## 2016-01-11 DIAGNOSIS — E119 Type 2 diabetes mellitus without complications: Secondary | ICD-10-CM

## 2016-01-11 DIAGNOSIS — Y829 Unspecified medical devices associated with adverse incidents: Secondary | ICD-10-CM | POA: Diagnosis present

## 2016-01-11 DIAGNOSIS — R180 Malignant ascites: Secondary | ICD-10-CM | POA: Diagnosis present

## 2016-01-11 DIAGNOSIS — I10 Essential (primary) hypertension: Secondary | ICD-10-CM | POA: Diagnosis present

## 2016-01-11 DIAGNOSIS — D696 Thrombocytopenia, unspecified: Secondary | ICD-10-CM | POA: Diagnosis not present

## 2016-01-11 DIAGNOSIS — T508X5A Adverse effect of diagnostic agents, initial encounter: Secondary | ICD-10-CM | POA: Diagnosis not present

## 2016-01-11 DIAGNOSIS — R579 Shock, unspecified: Secondary | ICD-10-CM | POA: Diagnosis not present

## 2016-01-11 DIAGNOSIS — Z90710 Acquired absence of both cervix and uterus: Secondary | ICD-10-CM

## 2016-01-11 DIAGNOSIS — Q620 Congenital hydronephrosis: Secondary | ICD-10-CM | POA: Diagnosis not present

## 2016-01-11 DIAGNOSIS — C7989 Secondary malignant neoplasm of other specified sites: Secondary | ICD-10-CM | POA: Diagnosis present

## 2016-01-11 DIAGNOSIS — D6481 Anemia due to antineoplastic chemotherapy: Secondary | ICD-10-CM | POA: Diagnosis present

## 2016-01-11 DIAGNOSIS — Z833 Family history of diabetes mellitus: Secondary | ICD-10-CM | POA: Diagnosis not present

## 2016-01-11 DIAGNOSIS — N141 Nephropathy induced by other drugs, medicaments and biological substances: Secondary | ICD-10-CM

## 2016-01-11 DIAGNOSIS — D6959 Other secondary thrombocytopenia: Secondary | ICD-10-CM

## 2016-01-11 DIAGNOSIS — H409 Unspecified glaucoma: Secondary | ICD-10-CM | POA: Diagnosis present

## 2016-01-11 DIAGNOSIS — R140 Abdominal distension (gaseous): Secondary | ICD-10-CM | POA: Diagnosis present

## 2016-01-11 DIAGNOSIS — R05 Cough: Secondary | ICD-10-CM | POA: Diagnosis not present

## 2016-01-11 DIAGNOSIS — J811 Chronic pulmonary edema: Secondary | ICD-10-CM | POA: Diagnosis present

## 2016-01-11 DIAGNOSIS — R410 Disorientation, unspecified: Secondary | ICD-10-CM | POA: Diagnosis present

## 2016-01-11 DIAGNOSIS — E875 Hyperkalemia: Secondary | ICD-10-CM | POA: Diagnosis present

## 2016-01-11 DIAGNOSIS — C55 Malignant neoplasm of uterus, part unspecified: Secondary | ICD-10-CM

## 2016-01-11 DIAGNOSIS — K5901 Slow transit constipation: Secondary | ICD-10-CM | POA: Diagnosis not present

## 2016-01-11 DIAGNOSIS — E43 Unspecified severe protein-calorie malnutrition: Secondary | ICD-10-CM | POA: Diagnosis present

## 2016-01-11 DIAGNOSIS — D509 Iron deficiency anemia, unspecified: Secondary | ICD-10-CM

## 2016-01-11 DIAGNOSIS — Y848 Other medical procedures as the cause of abnormal reaction of the patient, or of later complication, without mention of misadventure at the time of the procedure: Secondary | ICD-10-CM | POA: Diagnosis present

## 2016-01-11 DIAGNOSIS — Z6831 Body mass index (BMI) 31.0-31.9, adult: Secondary | ICD-10-CM

## 2016-01-11 DIAGNOSIS — I248 Other forms of acute ischemic heart disease: Secondary | ICD-10-CM | POA: Diagnosis not present

## 2016-01-11 DIAGNOSIS — E2740 Unspecified adrenocortical insufficiency: Secondary | ICD-10-CM | POA: Diagnosis present

## 2016-01-11 DIAGNOSIS — N19 Unspecified kidney failure: Secondary | ICD-10-CM | POA: Diagnosis not present

## 2016-01-11 DIAGNOSIS — C569 Malignant neoplasm of unspecified ovary: Secondary | ICD-10-CM | POA: Diagnosis not present

## 2016-01-11 LAB — CBC WITH DIFFERENTIAL/PLATELET
BASOS ABS: 0 10*3/uL (ref 0.0–0.1)
BASOS PCT: 0 %
Eosinophils Absolute: 0 10*3/uL (ref 0.0–0.7)
Eosinophils Relative: 0 %
HEMATOCRIT: 37.6 % (ref 36.0–46.0)
HEMOGLOBIN: 12.9 g/dL (ref 12.0–15.0)
LYMPHS ABS: 0.5 10*3/uL — AB (ref 0.7–4.0)
LYMPHS PCT: 3 %
MCH: 30.1 pg (ref 26.0–34.0)
MCHC: 34.3 g/dL (ref 30.0–36.0)
MCV: 87.6 fL (ref 78.0–100.0)
MONO ABS: 1.3 10*3/uL — AB (ref 0.1–1.0)
Monocytes Relative: 6 %
NEUTROS ABS: 18.4 10*3/uL — AB (ref 1.7–7.7)
Neutrophils Relative %: 91 %
Platelets: 257 10*3/uL (ref 150–400)
RBC: 4.29 MIL/uL (ref 3.87–5.11)
RDW: 13.3 % (ref 11.5–15.5)
WBC: 20.2 10*3/uL — ABNORMAL HIGH (ref 4.0–10.5)

## 2016-01-11 LAB — BLOOD GAS, ARTERIAL
ACID-BASE DEFICIT: 14 mmol/L — AB (ref 0.0–2.0)
BICARBONATE: 12.8 meq/L — AB (ref 20.0–24.0)
Drawn by: 422461
FIO2: 1
LHR: 26 {breaths}/min
O2 Saturation: 99.4 %
PEEP/CPAP: 5 cmH2O
PO2 ART: 391 mmHg — AB (ref 80.0–100.0)
Patient temperature: 98.6
TCO2: 12.3 mmol/L (ref 0–100)
VT: 430 mL
pCO2 arterial: 34.1 mmHg — ABNORMAL LOW (ref 35.0–45.0)
pH, Arterial: 7.201 — ABNORMAL LOW (ref 7.350–7.450)

## 2016-01-11 LAB — COMPREHENSIVE METABOLIC PANEL
ALBUMIN: 2.1 g/dL — AB (ref 3.5–5.0)
ALT: 14 U/L (ref 14–54)
AST: 25 U/L (ref 15–41)
Alkaline Phosphatase: 93 U/L (ref 38–126)
Anion gap: 13 (ref 5–15)
BILIRUBIN TOTAL: 0.9 mg/dL (ref 0.3–1.2)
BUN: 61 mg/dL — AB (ref 6–20)
CO2: 13 mmol/L — ABNORMAL LOW (ref 22–32)
Calcium: 8.6 mg/dL — ABNORMAL LOW (ref 8.9–10.3)
Chloride: 93 mmol/L — ABNORMAL LOW (ref 101–111)
Creatinine, Ser: 3.61 mg/dL — ABNORMAL HIGH (ref 0.44–1.00)
GFR calc Af Amer: 14 mL/min — ABNORMAL LOW (ref >60)
GFR calc non Af Amer: 12 mL/min — ABNORMAL LOW (ref >60)
GLUCOSE: 369 mg/dL — AB (ref 65–99)
POTASSIUM: 7.2 mmol/L — AB (ref 3.5–5.1)
Sodium: 119 mmol/L — CL (ref 135–145)
TOTAL PROTEIN: 5.7 g/dL — AB (ref 6.5–8.1)

## 2016-01-11 LAB — BASIC METABOLIC PANEL
Anion gap: 11 (ref 5–15)
Anion gap: 11 (ref 5–15)
BUN: 61 mg/dL — ABNORMAL HIGH (ref 6–20)
BUN: 64 mg/dL — ABNORMAL HIGH (ref 6–20)
CALCIUM: 8 mg/dL — AB (ref 8.9–10.3)
CALCIUM: 8 mg/dL — AB (ref 8.9–10.3)
CO2: 14 mmol/L — AB (ref 22–32)
CO2: 14 mmol/L — AB (ref 22–32)
CREATININE: 3.56 mg/dL — AB (ref 0.44–1.00)
Chloride: 95 mmol/L — ABNORMAL LOW (ref 101–111)
Chloride: 97 mmol/L — ABNORMAL LOW (ref 101–111)
Creatinine, Ser: 3.76 mg/dL — ABNORMAL HIGH (ref 0.44–1.00)
GFR calc Af Amer: 14 mL/min — ABNORMAL LOW (ref >60)
GFR, EST AFRICAN AMERICAN: 14 mL/min — AB (ref >60)
GFR, EST NON AFRICAN AMERICAN: 12 mL/min — AB (ref >60)
GFR, EST NON AFRICAN AMERICAN: 12 mL/min — AB (ref >60)
GLUCOSE: 377 mg/dL — AB (ref 65–99)
GLUCOSE: 373 mg/dL — AB (ref 65–99)
Potassium: 6.7 mmol/L (ref 3.5–5.1)
Potassium: 6.8 mmol/L (ref 3.5–5.1)
Sodium: 120 mmol/L — ABNORMAL LOW (ref 135–145)
Sodium: 122 mmol/L — ABNORMAL LOW (ref 135–145)

## 2016-01-11 LAB — CBG MONITORING, ED: Glucose-Capillary: 347 mg/dL — ABNORMAL HIGH (ref 65–99)

## 2016-01-11 LAB — BRAIN NATRIURETIC PEPTIDE: B Natriuretic Peptide: 341.7 pg/mL — ABNORMAL HIGH (ref 0.0–100.0)

## 2016-01-11 LAB — TROPONIN I: Troponin I: 0.26 ng/mL — ABNORMAL HIGH (ref <0.031)

## 2016-01-11 LAB — GLUCOSE, CAPILLARY: Glucose-Capillary: 278 mg/dL — ABNORMAL HIGH (ref 65–99)

## 2016-01-11 LAB — LACTIC ACID, PLASMA: Lactic Acid, Venous: 3 mmol/L (ref 0.5–2.0)

## 2016-01-11 MED ORDER — SODIUM CHLORIDE 0.9 % IV SOLN
250.0000 mL | INTRAVENOUS | Status: DC | PRN
  Administered 2016-01-13: 16:00:00 via INTRAVENOUS
  Administered 2016-01-15 – 2016-01-28 (×4): 250 mL via INTRAVENOUS

## 2016-01-11 MED ORDER — SODIUM CHLORIDE 0.9 % IV SOLN: INTRAVENOUS | Status: DC

## 2016-01-11 MED ORDER — SODIUM BICARBONATE 8.4 % IV SOLN
50.0000 meq | Freq: Once | INTRAVENOUS | Status: AC
  Administered 2016-01-11: 50 meq via INTRAVENOUS
  Filled 2016-01-11: qty 50

## 2016-01-11 MED ORDER — SODIUM CHLORIDE 0.9% FLUSH: 10.0000 mL | INTRAVENOUS | Status: DC | PRN

## 2016-01-11 MED ORDER — SODIUM CHLORIDE 0.9 % IV SOLN
25.0000 ug/h | INTRAVENOUS | Status: DC
  Administered 2016-01-11 – 2016-01-13 (×3): 50 ug/h via INTRAVENOUS
  Filled 2016-01-11 (×2): qty 50

## 2016-01-11 MED ORDER — DEXTROSE 5 % IV SOLN
0.0000 ug/min | INTRAVENOUS | Status: DC
  Administered 2016-01-11 – 2016-01-12 (×2): 5 ug/min via INTRAVENOUS
  Administered 2016-01-12 – 2016-01-13 (×3): 10 ug/min via INTRAVENOUS
  Administered 2016-01-14: 11 ug/min via INTRAVENOUS
  Administered 2016-01-15 – 2016-01-16 (×2): 2 ug/min via INTRAVENOUS
  Administered 2016-01-16: 3 ug/min via INTRAVENOUS
  Administered 2016-01-17: 2 ug/min via INTRAVENOUS
  Administered 2016-01-17: 11 ug/min via INTRAVENOUS
  Administered 2016-01-18: 4 ug/min via INTRAVENOUS
  Administered 2016-01-19: 12 ug/min via INTRAVENOUS
  Administered 2016-01-19: 18 ug/min via INTRAVENOUS
  Administered 2016-01-19 (×2): 12 ug/min via INTRAVENOUS
  Administered 2016-01-19: 15 ug/min via INTRAVENOUS
  Administered 2016-01-20 (×2): 12 ug/min via INTRAVENOUS
  Administered 2016-01-20 (×2): 13 ug/min via INTRAVENOUS
  Administered 2016-01-20: 12 ug/min via INTRAVENOUS
  Administered 2016-01-21: 14 ug/min via INTRAVENOUS
  Administered 2016-01-21: 13 ug/min via INTRAVENOUS
  Administered 2016-01-21: 14 ug/min via INTRAVENOUS
  Administered 2016-01-21: 15 ug/min via INTRAVENOUS
  Administered 2016-01-22: 11 ug/min via INTRAVENOUS
  Administered 2016-01-22: 3 ug/min via INTRAVENOUS
  Administered 2016-01-22: 11 ug/min via INTRAVENOUS
  Administered 2016-01-23: 7.5 ug/min via INTRAVENOUS
  Administered 2016-01-23: 7 ug/min via INTRAVENOUS
  Administered 2016-01-24: 3 ug/min via INTRAVENOUS
  Filled 2016-01-11 (×33): qty 4

## 2016-01-11 MED ORDER — PIPERACILLIN-TAZOBACTAM IN DEX 2-0.25 GM/50ML IV SOLN
2.2500 g | Freq: Three times a day (TID) | INTRAVENOUS | Status: DC
  Administered 2016-01-11 – 2016-01-12 (×2): 2.25 g via INTRAVENOUS
  Filled 2016-01-11 (×3): qty 50

## 2016-01-11 MED ORDER — FUROSEMIDE 10 MG/ML IJ SOLN
60.0000 mg | Freq: Once | INTRAMUSCULAR | Status: AC
  Administered 2016-01-11: 60 mg via INTRAVENOUS
  Filled 2016-01-11: qty 8

## 2016-01-11 MED ORDER — MIDAZOLAM HCL 2 MG/2ML IJ SOLN: 1.0000 mg | INTRAMUSCULAR | Status: DC | PRN

## 2016-01-11 MED ORDER — SODIUM BICARBONATE 8.4 % IV SOLN
100.0000 meq | Freq: Once | INTRAVENOUS | Status: AC
  Administered 2016-01-11: 100 meq via INTRAVENOUS
  Filled 2016-01-11: qty 100
  Filled 2016-01-11: qty 50

## 2016-01-11 MED ORDER — HEPARIN SOD (PORK) LOCK FLUSH 100 UNIT/ML IV SOLN: 500.0000 [IU] | Freq: Every day | INTRAVENOUS | Status: DC | PRN

## 2016-01-11 MED ORDER — CALCIUM GLUCONATE 10 % IV SOLN
1.0000 g | Freq: Once | INTRAVENOUS | Status: AC
  Administered 2016-01-11: 1 g via INTRAVENOUS
  Filled 2016-01-11 (×2): qty 10

## 2016-01-11 MED ORDER — PANTOPRAZOLE SODIUM 40 MG IV SOLR
40.0000 mg | Freq: Every day | INTRAVENOUS | Status: DC
  Administered 2016-01-11: 40 mg via INTRAVENOUS

## 2016-01-11 MED ORDER — FENTANYL CITRATE (PF) 100 MCG/2ML IJ SOLN
INTRAMUSCULAR | Status: AC
  Administered 2016-01-11: 100 ug
  Filled 2016-01-11: qty 4

## 2016-01-11 MED ORDER — HYDROCORTISONE NA SUCCINATE PF 100 MG IJ SOLR
50.0000 mg | Freq: Four times a day (QID) | INTRAMUSCULAR | Status: DC
  Administered 2016-01-12 – 2016-01-15 (×14): 50 mg via INTRAVENOUS
  Filled 2016-01-11 (×14): qty 2

## 2016-01-11 MED ORDER — SODIUM CHLORIDE 0.9 % IV SOLN
250.0000 mL | Freq: Once | INTRAVENOUS | Status: AC
  Administered 2016-01-11: 250 mL via INTRAVENOUS

## 2016-01-11 MED ORDER — INSULIN ASPART 100 UNIT/ML IV SOLN
10.0000 [IU] | Freq: Once | INTRAVENOUS | Status: AC
  Administered 2016-01-11: 10 [IU] via INTRAVENOUS
  Filled 2016-01-11: qty 0.1

## 2016-01-11 MED ORDER — SODIUM BICARBONATE 8.4 % IV SOLN
INTRAVENOUS | Status: DC
  Administered 2016-01-11: 23:00:00 via INTRAVENOUS
  Filled 2016-01-11: qty 150

## 2016-01-11 MED ORDER — HEPARIN SODIUM (PORCINE) 5000 UNIT/ML IJ SOLN
5000.0000 [IU] | Freq: Three times a day (TID) | INTRAMUSCULAR | Status: DC
  Administered 2016-01-11 – 2016-01-17 (×16): 5000 [IU] via SUBCUTANEOUS
  Filled 2016-01-11 (×17): qty 1

## 2016-01-11 MED ORDER — NITROGLYCERIN 2 % TD OINT
1.0000 [in_us] | TOPICAL_OINTMENT | Freq: Once | TRANSDERMAL | Status: AC
Start: 2016-01-11 — End: 2016-01-11
  Administered 2016-01-11: 1 [in_us] via TOPICAL
  Filled 2016-01-11: qty 1

## 2016-01-11 MED ORDER — ETOMIDATE 2 MG/ML IV SOLN
15.0000 mg | Freq: Once | INTRAVENOUS | Status: AC
  Administered 2016-01-11: 15 mg via INTRAVENOUS

## 2016-01-11 MED ORDER — MIDAZOLAM HCL 2 MG/2ML IJ SOLN
INTRAMUSCULAR | Status: AC
  Administered 2016-01-11: 2 mg
  Filled 2016-01-11: qty 4

## 2016-01-11 MED ORDER — ALBUTEROL (5 MG/ML) CONTINUOUS INHALATION SOLN
15.0000 mg | INHALATION_SOLUTION | Freq: Once | RESPIRATORY_TRACT | Status: AC
Start: 2016-01-11 — End: 2016-01-11
  Administered 2016-01-11: 15 mg via RESPIRATORY_TRACT
  Filled 2016-01-11: qty 20

## 2016-01-11 MED ORDER — DEXTROSE 50 % IV SOLN
1.0000 | Freq: Once | INTRAVENOUS | Status: AC
  Administered 2016-01-11: 50 mL via INTRAVENOUS
  Filled 2016-01-11: qty 50

## 2016-01-11 MED ORDER — PANTOPRAZOLE SODIUM 40 MG IV SOLR
40.0000 mg | Freq: Every day | INTRAVENOUS | Status: DC
  Filled 2016-01-11: qty 40

## 2016-01-11 MED ORDER — ACETAMINOPHEN 325 MG PO TABS
325.0000 mg | ORAL_TABLET | Freq: Once | ORAL | Status: AC
  Administered 2016-01-11: 325 mg via ORAL
  Filled 2016-01-11: qty 1

## 2016-01-11 MED ORDER — FENTANYL BOLUS VIA INFUSION
25.0000 ug | INTRAVENOUS | Status: DC | PRN
  Filled 2016-01-11: qty 25

## 2016-01-11 MED ORDER — INSULIN ASPART 100 UNIT/ML ~~LOC~~ SOLN
0.0000 [IU] | SUBCUTANEOUS | Status: DC
  Administered 2016-01-11: 15 [IU] via SUBCUTANEOUS
  Administered 2016-01-12: 8 [IU] via SUBCUTANEOUS
  Administered 2016-01-12: 2 [IU] via SUBCUTANEOUS
  Administered 2016-01-12: 15 [IU] via SUBCUTANEOUS
  Administered 2016-01-12: 2 [IU] via SUBCUTANEOUS
  Administered 2016-01-12: 15 [IU] via SUBCUTANEOUS
  Administered 2016-01-12: 3 [IU] via SUBCUTANEOUS
  Administered 2016-01-13 (×3): 2 [IU] via SUBCUTANEOUS
  Administered 2016-01-14 (×4): 3 [IU] via SUBCUTANEOUS
  Administered 2016-01-14: 5 [IU] via SUBCUTANEOUS
  Administered 2016-01-15 (×2): 2 [IU] via SUBCUTANEOUS
  Filled 2016-01-11: qty 1

## 2016-01-11 MED ORDER — FENTANYL CITRATE (PF) 100 MCG/2ML IJ SOLN
50.0000 ug | Freq: Once | INTRAMUSCULAR | Status: AC
  Administered 2016-01-11: 50 ug via INTRAVENOUS

## 2016-01-11 MED ORDER — VANCOMYCIN HCL IN DEXTROSE 1-5 GM/200ML-% IV SOLN
1000.0000 mg | INTRAVENOUS | Status: AC
  Administered 2016-01-11: 1000 mg via INTRAVENOUS
  Filled 2016-01-11: qty 200

## 2016-01-11 NOTE — Progress Notes (Signed)
ANTIBIOTIC CONSULT NOTE - INITIAL  Pharmacy Consult for vancomycin and zosyn Indication: empiric  No Known Allergies  Patient Measurements: weight 74 kg, height 61 inches    Vital Signs: Temp: 97.5 F (36.4 C) (02/09 1603) Temp Source: Oral (02/09 1405) BP: 62/43 mmHg (02/09 2054) Pulse Rate: 71 (02/09 2054) Intake/Output from previous day:   Intake/Output from this shift:    Labs:  Recent Labs  01/10/16 1323 01/11/16 1652  WBC 15.2* 20.2*  HGB 8.6* 12.9  PLT 245 257  CREATININE  --  3.61*   Estimated Creatinine Clearance: 14.3 mL/min (by C-G formula based on Cr of 3.61). No results for input(s): VANCOTROUGH, VANCOPEAK, VANCORANDOM, GENTTROUGH, GENTPEAK, GENTRANDOM, TOBRATROUGH, TOBRAPEAK, TOBRARND, AMIKACINPEAK, AMIKACINTROU, AMIKACIN in the last 72 hours.   Microbiology: Recent Results (from the past 720 hour(s))  Urine culture     Status: None   Collection Time: 01/04/16 10:50 AM  Result Value Ref Range Status   Specimen Description URINE, RANDOM  Final   Special Requests NONE  Final   Culture   Final    1,000 COLONIES/mL INSIGNIFICANT GROWTH Performed at [facility]    Report Status 01/05/2016 FINAL  Final  Culture, blood (routine x 2)     Status: None   Collection Time: 01/05/16  2:20 AM  Result Value Ref Range Status   Specimen Description BLOOD LEFT ANTECUBITAL  Final   Special Requests IN PEDIATRIC BOTTLE 4ML  Final   Culture   Final    NO GROWTH 5 DAYS Performed at [facility]    Report Status 01/10/2016 FINAL  Final  Culture, blood (routine x 2)     Status: None   Collection Time: 01/05/16  2:26 AM  Result Value Ref Range Status   Specimen Description BLOOD BLOOD LEFT FOREARM  Final   Special Requests IN PEDIATRIC BOTTLE 4ML  Final   Culture   Final    NO GROWTH 5 DAYS Performed at [facility]    Report Status 01/10/2016 FINAL  Final    Medical History: Past Medical History  Diagnosis Date  . Hypertension   .  Glaucoma   . Diabetes mellitus without complication ([place])   . Palpitations   . Muscle cramps   . Postmenopausal vaginal bleeding   . Uterine cancer [facility])    Assessment: Patient is a 65 y.o F with hx of uterine cancer currently undergoing chemotherapy and ascites who presented to the ED from sickle cell center after becoming diaphoretic and hypertensive while receiving blood transfusion.  Patient's now intubated.  To start broad abx with vancomycin and zosyn for empiric coverage.  - afeb, wbc 20.2, scr 3.61 (crcl~14)  Antimicrobials this admission: 2/9 vanc>> 2/9 zosyn>>    Goal of Therapy:  Vancomycin trough level 15-20 mcg/ml  Plan:  - zosyn 2.25 gm IV q8h - vancomycin 1gm IV x1 -- will check random level in a couple of days and redose if <20 - f/u renal funct  Dewana Ammirati P 01/11/2016,9:26 PM

## 2016-01-11 NOTE — ED Provider Notes (Signed)
CSN: [id number]     Arrival date & time 01/11/16  1620 History   First MD Initiated Contact with Patient 01/11/16 1628     Chief Complaint  Patient presents with  . Shortness of Breath      HPI Patient presents with increased shortness of breath.  She was receiving transfusion because of low hemoglobin and she developed acute shortness of breath.  She has no underlying lung problems.  She has history of ovarian cancer and ascites.  She has no end-of-life orders. Past Medical History  Diagnosis Date  . Hypertension   . Glaucoma   . Diabetes mellitus without complication ([place])   . Palpitations   . Muscle cramps   . Postmenopausal vaginal bleeding   . Uterine cancer [facility])    Past Surgical History  Procedure Laterality Date  . Robotic assisted total hysterectomy with bilateral salpingo oopherectomy Bilateral 01/19/2015    Procedure: XI ROBOTIC ASSISTED TOTAL HYSTERECTOMY WITH BILATERAL SALPINGO OOPHORECTOMY;  Surgeon: [name], MD;  Location: [facility];  Service: Gynecology;  Laterality: Bilateral;   Family History  Problem Relation Age of Onset  . Diabetes Father   . Arrhythmia Mother    Social History  Substance Use Topics  . Smoking status: Never Smoker   . Smokeless tobacco: Never Used  . Alcohol Use: No   OB History    No data available     Review of Systems  Unable to perform ROS: Acuity of condition      Allergies  Review of patient's allergies indicates no known allergies.  Home Medications   Prior to Admission medications   Medication Sig Start Date End Date Taking? Authorizing Provider  acetaminophen (TYLENOL) 325 MG tablet Take 650 mg by mouth every 6 (six) hours as needed for mild pain, moderate pain or headache. Reported on 01/08/2016   Yes Historical Provider, MD  ferrous fumarate (HEMOCYTE - 106 MG FE) 325 (106 FE) MG TABS tablet Take 1 tablet daily  on an empty stomach with OJ or vitamin C tablet 09/11/15  Yes [name], MD  glipiZIDE  (GLUCOTROL XL) 5 MG 24 hr tablet Take 5 mg by mouth 2 (two) times daily.   Yes Historical Provider, MD  latanoprost (XALATAN) 0.005 % ophthalmic solution Place 1 drop into both eyes at bedtime.   Yes Historical Provider, MD  lidocaine-prilocaine (EMLA) cream Apply to Porta-Cath 1-2 hrs prior to access as directed. 01/02/16  Yes [name], MD  magnesium 30 MG tablet Take 30 mg by mouth daily.   Yes Historical Provider, MD  metFORMIN (GLUCOPHAGE) 1000 MG tablet Take 1,000 mg by mouth 2 (two) times daily. 12/12/15  Yes Historical Provider, MD  oxyCODONE (ROXICODONE) 5 MG immediate release tablet Take 1 tablet every 4-6 hours as needed for pain. 01/08/16  Yes [name], MD  pantoprazole (PROTONIX) 40 MG tablet Take 1 tablet (40 mg total) by mouth daily. 11/20/15  Yes Everitt Amber, MD  pravastatin (PRAVACHOL) 20 MG tablet Take 20 mg by mouth daily.   Yes Historical Provider, MD  timolol (BETIMOL) 0.5 % ophthalmic solution Place 1 drop into both eyes 2 (two) times daily.   Yes Historical Provider, MD  feeding supplement, GLUCERNA SHAKE, (GLUCERNA SHAKE) LIQD Take 237 mLs by mouth daily. Patient not taking: Reported on 01/08/2016 01/04/16   Silver Huguenin Elgergawy, MD  furosemide (LASIX) 40 MG tablet Take 1 tablet (40 mg total) by mouth 2 (two) times daily. Patient not taking: Reported on 01/08/2016  01/07/16   Silver Huguenin Elgergawy, MD  gabapentin (NEURONTIN) 100 MG capsule Take 1 capsule (100 mg total) by mouth at bedtime. Take 1 at bedtime for neuropathy Patient not taking: Reported on 01/04/2016 08/14/15   [name], MD  LORazepam (ATIVAN) 0.5 MG tablet Place 1 tablet under tongue or swallow every 8 hours as needed for nausea. Will make drowsy. Patient not taking: Reported on 01/08/2016 01/07/16   Silver Huguenin Elgergawy, MD  ondansetron (ZOFRAN) 8 MG tablet Take 1 tablet by mouth every 8 hours as needed for nausea. Will not make drowsy. Patient not taking: Reported on 01/08/2016 01/02/16   [name], MD   prochlorperazine (COMPAZINE) 10 MG tablet Take 1 tablet (10 mg total) by mouth every 6 (six) hours as needed for nausea or vomiting. Patient not taking: Reported on 01/08/2016 01/02/16   [name], MD  protein supplement shake (PREMIER PROTEIN) LIQD Take 237 mLs (8 oz total) by mouth 2 (two) times daily between meals. Patient not taking: Reported on 01/11/2016 01/04/16   Silver Huguenin Elgergawy, MD   BP 109/64 mmHg  Pulse 103  Temp(Src) 96.8 F (36 C) (Core (Comment))  Resp 25  Ht 5\' 2"  (1.575 m)  Wt 167 lb 12.3 oz (76.1 kg)  BMI 30.68 kg/m2  SpO2 100% Physical Exam  Constitutional: She is oriented to person, place, and time. She appears well-developed and well-nourished. She appears distressed.  HENT:  Head: Normocephalic and atraumatic.  Eyes: Pupils are equal, round, and reactive to light.  Neck: Normal range of motion.  Cardiovascular: Normal rate and intact distal pulses.   Pulmonary/Chest: No respiratory distress. She has rales.  Abdominal: Normal appearance. She exhibits fluid wave and ascites. She exhibits no distension.  Musculoskeletal: Normal range of motion.  Neurological: She is alert and oriented to person, place, and time. No cranial nerve deficit.  Skin: Skin is warm and dry. No rash noted.  Psychiatric: She has a normal mood and affect. Her behavior is normal.  Nursing note and vitals reviewed.   ED Course  Procedures (including critical care time)  CRITICAL CARE Performed by: Leonard Schwartz L Total critical care time: 60 minutes Critical care time was exclusive of separately billable procedures and treating other patients. Critical care was necessary to treat or prevent imminent or life-threatening deterioration. Critical care was time spent personally by me on the following activities: development of treatment plan with patient and/or surrogate as well as nursing, discussions with consultants, evaluation of patient's response to treatment, examination of patient,  obtaining history from patient or surrogate, ordering and performing treatments and interventions, ordering and review of laboratory studies, ordering and review of radiographic studies, pulse oximetry and re-evaluation of patient's condition.  Medications  norepinephrine (LEVOPHED) 4 mg in dextrose 5 % 250 mL (0.016 mg/mL) infusion (9 mcg/min Intravenous Rate/Dose Change 01/13/16 0906)  heparin injection 5,000 Units (5,000 Units Subcutaneous Given 01/13/16 0657)  0.9 %  sodium chloride infusion (not administered)  insulin aspart (novoLOG) injection 0-15 Units (2 Units Subcutaneous Given 01/13/16 0816)  sodium bicarbonate 150 mEq in dextrose 5 % 1,000 mL infusion ( Intravenous Stopped 01/12/16 0548)  fentaNYL (SUBLIMAZE) 2,500 mcg in sodium chloride 0.9 % 250 mL (10 mcg/mL) infusion (50 mcg/hr Intravenous Bolus from Bag 01/12/16 1048)  fentaNYL (SUBLIMAZE) bolus via infusion 25 mcg (not administered)  hydrocortisone sodium succinate (SOLU-CORTEF) 100 MG injection 50 mg (50 mg Intravenous Given 01/13/16 0658)  pantoprazole (PROTONIX) injection 40 mg (40 mg Intravenous Given 01/12/16 2205)  sodium chloride flush (NS) 0.9 % injection 10-40 mL (10 mLs Intracatheter Given 01/12/16 2215)  sodium chloride flush (NS) 0.9 % injection 10-40 mL (not administered)  heparin injection 1,000-6,000 Units (not administered)  heparinized saline (2000 units/L) primer fluid for CRRT (not administered)  prismasol BGK 0/2.5 5,000 mL dialysis replacement fluid ( CRRT New Bag/Given 01/13/16 0535)  prismasol BGK 0/2.5 5,000 mL dialysis replacement fluid ( CRRT New Bag/Given 01/13/16 0038)  prismasol BGK 4/2.5 5,000 mL dialysis solution ( CRRT New Bag/Given 01/13/16 1022)  chlorhexidine gluconate (PERIDEX) 0.12 % solution 15 mL (15 mLs Mouth Rinse Given 01/13/16 0748)  antiseptic oral rinse solution (CORINZ) (7 mLs Mouth Rinse Given 01/13/16 0416)  piperacillin-tazobactam (ZOSYN) IVPB 3.375 g (3.375 g Intravenous Given 01/13/16 0658)   vancomycin (VANCOCIN) IVPB 1000 mg/200 mL premix (1,000 mg Intravenous Given 01/13/16 0809)  midazolam (VERSED) injection 1-2 mg (not administered)  furosemide (LASIX) injection 60 mg (60 mg Intravenous Given 01/11/16 1647)  nitroGLYCERIN (NITROGLYN) 2 % ointment 1 inch (1 inch Topical Given 01/11/16 1647)  albuterol (PROVENTIL,VENTOLIN) solution continuous neb (15 mg Nebulization Given 01/11/16 1827)  calcium gluconate inj 10% (1 g) URGENT USE ONLY! (1 g Intravenous Given 01/11/16 1810)  insulin aspart (novoLOG) injection 10 Units (10 Units Intravenous Given 01/11/16 1759)  dextrose 50 % solution 50 mL (50 mLs Intravenous Given 01/11/16 1758)  sodium bicarbonate injection 50 mEq (50 mEq Intravenous Given 01/11/16 1758)  fentaNYL (SUBLIMAZE) 100 MCG/2ML injection (100 mcg  Given 01/11/16 2035)  midazolam (VERSED) 2 MG/2ML injection (2 mg  Given 01/11/16 2035)  etomidate (AMIDATE) injection 15 mg (15 mg Intravenous Given 01/11/16 2036)  fentaNYL (SUBLIMAZE) injection 50 mcg (50 mcg Intravenous Given 01/11/16 2226)  vancomycin (VANCOCIN) IVPB 1000 mg/200 mL premix (1,000 mg Intravenous New Bag/Given 01/11/16 2257)  sodium bicarbonate injection 100 mEq (100 mEq Intravenous Given 01/11/16 2248)  alteplase (ACTIVASE) injection 2 mg (2 mg Intracatheter Given 01/12/16 1037)  alteplase (ACTIVASE) injection 2 mg (2 mg Intracatheter Given 01/12/16 1037)  vitamin A & D ointment (1 application  Given 0000000 1127)    Labs Review Labs Reviewed  CBC WITH DIFFERENTIAL/PLATELET - Abnormal; Notable for the following:    WBC 20.2 (*)    Neutro Abs 18.4 (*)    Lymphs Abs 0.5 (*)    Monocytes Absolute 1.3 (*)    All other components within normal limits  COMPREHENSIVE METABOLIC PANEL - Abnormal; Notable for the following:    Sodium 119 (*)    Potassium 7.2 (*)    Chloride 93 (*)    CO2 13 (*)    Glucose, Bld 369 (*)    BUN 61 (*)    Creatinine, Ser 3.61 (*)    Calcium 8.6 (*)    Total Protein 5.7 (*)    Albumin 2.1 (*)     GFR calc non Af Amer 12 (*)    GFR calc Af Amer 14 (*)    All other components within normal limits  BRAIN NATRIURETIC PEPTIDE - Abnormal; Notable for the following:    B Natriuretic Peptide 341.7 (*)    All other components within normal limits  LACTIC ACID, PLASMA - Abnormal; Notable for the following:    Lactic Acid, Venous 3.0 (*)    All other components within normal limits  CBC - Abnormal; Notable for the following:    WBC 18.2 (*)    RBC 3.76 (*)    Hemoglobin 11.6 (*)    HCT 32.8 (*)  All other components within normal limits  BLOOD GAS, ARTERIAL - Abnormal; Notable for the following:    pCO2 arterial 26.5 (*)    pO2, Arterial 152 (*)    Bicarbonate 16.6 (*)    Acid-base deficit 7.2 (*)    All other components within normal limits  PHOSPHORUS - Abnormal; Notable for the following:    Phosphorus 5.9 (*)    All other components within normal limits  HEMOGLOBIN A1C - Abnormal; Notable for the following:    Hgb A1c MFr Bld 6.5 (*)    All other components within normal limits  BASIC METABOLIC PANEL - Abnormal; Notable for the following:    Sodium 120 (*)    Potassium 6.7 (*)    Chloride 95 (*)    CO2 14 (*)    Glucose, Bld 377 (*)    BUN 61 (*)    Creatinine, Ser 3.56 (*)    Calcium 8.0 (*)    GFR calc non Af Amer 12 (*)    GFR calc Af Amer 14 (*)    All other components within normal limits  BASIC METABOLIC PANEL - Abnormal; Notable for the following:    Sodium 122 (*)    Potassium 6.8 (*)    Chloride 97 (*)    CO2 14 (*)    Glucose, Bld 373 (*)    BUN 64 (*)    Creatinine, Ser 3.76 (*)    Calcium 8.0 (*)    GFR calc non Af Amer 12 (*)    GFR calc Af Amer 14 (*)    All other components within normal limits  BASIC METABOLIC PANEL - Abnormal; Notable for the following:    Sodium 121 (*)    Potassium 6.3 (*)    Chloride 92 (*)    CO2 17 (*)    Glucose, Bld 399 (*)    BUN 63 (*)    Creatinine, Ser 3.73 (*)    Calcium 7.8 (*)    GFR calc non Af Amer 12 (*)     GFR calc Af Amer 14 (*)    All other components within normal limits  BASIC METABOLIC PANEL - Abnormal; Notable for the following:    Sodium 124 (*)    Potassium 6.3 (*)    Chloride 94 (*)    CO2 19 (*)    Glucose, Bld 382 (*)    BUN 64 (*)    Creatinine, Ser 3.64 (*)    Calcium 7.9 (*)    GFR calc non Af Amer 12 (*)    GFR calc Af Amer 14 (*)    All other components within normal limits  TROPONIN I - Abnormal; Notable for the following:    Troponin I 0.26 (*)    All other components within normal limits  BLOOD GAS, ARTERIAL - Abnormal; Notable for the following:    pH, Arterial 7.201 (*)    pCO2 arterial 34.1 (*)    pO2, Arterial 391 (*)    Bicarbonate 12.8 (*)    Acid-base deficit 14.0 (*)    All other components within normal limits  BLOOD GAS, ARTERIAL - Abnormal; Notable for the following:    pH, Arterial 7.294 (*)    pCO2 arterial 33.2 (*)    pO2, Arterial 220 (*)    Bicarbonate 15.6 (*)    Acid-base deficit 9.5 (*)    All other components within normal limits  URINALYSIS, ROUTINE W REFLEX MICROSCOPIC (NOT AT [address] [employer]) - Abnormal; Notable for the  following:    Color, Urine AMBER (*)    APPearance TURBID (*)    Glucose, UA 250 (*)    Hgb urine dipstick LARGE (*)    Protein, ur 30 (*)    Leukocytes, UA LARGE (*)    All other components within normal limits  URINE MICROSCOPIC-ADD ON - Abnormal; Notable for the following:    Squamous Epithelial / LPF 0-5 (*)    Bacteria, UA MANY (*)    Casts GRANULAR CAST (*)    All other components within normal limits  RENAL FUNCTION PANEL - Abnormal; Notable for the following:    Sodium 126 (*)    Potassium 5.8 (*)    Chloride 95 (*)    CO2 19 (*)    Glucose, Bld 267 (*)    BUN 56 (*)    Creatinine, Ser 3.13 (*)    Calcium 8.1 (*)    Albumin 1.8 (*)    GFR calc non Af Amer 15 (*)    GFR calc Af Amer 17 (*)    All other components within normal limits  RENAL FUNCTION PANEL - Abnormal; Notable for the following:    Sodium  131 (*)    Potassium 5.9 (*)    Chloride 98 (*)    CO2 21 (*)    Glucose, Bld 142 (*)    BUN 52 (*)    Creatinine, Ser 2.89 (*)    Calcium 8.0 (*)    Phosphorus 5.2 (*)    Albumin 1.8 (*)    GFR calc non Af Amer 16 (*)    GFR calc Af Amer 19 (*)    All other components within normal limits  GLUCOSE, CAPILLARY - Abnormal; Notable for the following:    Glucose-Capillary 366 (*)    All other components within normal limits  GLUCOSE, CAPILLARY - Abnormal; Notable for the following:    Glucose-Capillary 279 (*)    All other components within normal limits  GLUCOSE, CAPILLARY - Abnormal; Notable for the following:    Glucose-Capillary 181 (*)    All other components within normal limits  GLUCOSE, CAPILLARY - Abnormal; Notable for the following:    Glucose-Capillary 129 (*)    All other components within normal limits  RENAL FUNCTION PANEL - Abnormal; Notable for the following:    Sodium 132 (*)    Chloride 100 (*)    Glucose, Bld 155 (*)    BUN 34 (*)    Creatinine, Ser 1.94 (*)    Calcium 7.3 (*)    Albumin 1.6 (*)    GFR calc non Af Amer 26 (*)    GFR calc Af Amer 30 (*)    All other components within normal limits  GLUCOSE, CAPILLARY - Abnormal; Notable for the following:    Glucose-Capillary 356 (*)    All other components within normal limits  CBC WITH DIFFERENTIAL/PLATELET - Abnormal; Notable for the following:    WBC 16.3 (*)    RBC 3.41 (*)    Hemoglobin 10.4 (*)    HCT 29.7 (*)    Platelets 111 (*)    Neutro Abs 15.6 (*)    Lymphs Abs 0.4 (*)    All other components within normal limits  GLUCOSE, CAPILLARY - Abnormal; Notable for the following:    Glucose-Capillary 122 (*)    All other components within normal limits  GLUCOSE, CAPILLARY - Abnormal; Notable for the following:    Glucose-Capillary 113 (*)    All other components within  normal limits  GLUCOSE, CAPILLARY - Abnormal; Notable for the following:    Glucose-Capillary 161 (*)    All other components  within normal limits  CBG MONITORING, ED - Abnormal; Notable for the following:    Glucose-Capillary 347 (*)    All other components within normal limits  CULTURE, BLOOD (ROUTINE X 2)  CULTURE, BLOOD (ROUTINE X 2)  MRSA PCR SCREENING  URINE CULTURE  MAGNESIUM  MAGNESIUM  MAGNESIUM  GLUCOSE, CAPILLARY  CALCIUM, IONIZED  RENAL FUNCTION PANEL    Imaging Review Ct Abdomen Pelvis Wo Contrast  01/11/2016  CLINICAL DATA:  Advanced endometrial cancer with peritoneal spread. Recent transfusion for anemia, subsequently developing shortness of breath. EXAM: CT CHEST, ABDOMEN AND PELVIS WITHOUT CONTRAST TECHNIQUE: Multidetector CT imaging of the chest, abdomen and pelvis was performed following the standard protocol without IV contrast. COMPARISON:  CT abdomen pelvis with contrast 01/08/2016. FINDINGS: CT CHEST FINDINGS Patient has been intubated. Endotracheal tube is near carina and should be withdrawn 3-4 cm. Port-A-Cath tip unchanged proximal RIGHT atrium. New LEFT IJ dual-lumen venous catheter distal SVC. No pneumothorax. Mediastinum/Lymph Nodes: No masses or pathologically enlarged lymph nodes identified on this un-enhanced exam. Lungs/Pleura: BILATERAL pleural effusions, larger on the RIGHT. Suspected LEFT greater than RIGHT lower lobe infiltrates. Perihilar opacities suggesting superimposed pulmonary edema. Musculoskeletal: No chest wall mass or suspicious bone lesions identified. CT ABDOMEN PELVIS FINDINGS Hepatobiliary: No mass visualized on this un-enhanced exam. Pancreas: No mass or inflammatory process identified on this un-enhanced exam. Spleen: Within normal limits in size. Adrenals/Urinary Tract: LEFT hydronephrosis redemonstrated. BILATERAL contrast nephrograms persists from the February 6 scan indicating acute renal failure. Large pelvic mass. Extrinsic mass effect on the bladder with posterior wall invasion and LEFT UVJ mass effect contributes to hydronephrosis. Stomach/Bowel: No evidence of  obstruction, inflammatory process, or abnormal fluid collections. Vascular/Lymphatic: No pathologically enlarged lymph nodes. No evidence of abdominal aortic aneurysm. Reproductive: Status post hysterectomy. Widespread omental tumor. Marked worsening of ascites. Other: None. Musculoskeletal:  No suspicious bone lesions identified. IMPRESSION: Marked worsening aeration since priors. Pulmonary edema with superimposed lower lobe infiltrates, LEFT greater than RIGHT. Endotracheal tube too low.  Withdrawal 3-4 cm. Marked worsening ascites. Pelvic tumor redemonstrated with bladder invasion. Persistent contrast nephrograms with LEFT hydronephrosis. Electronically Signed   By: Staci Righter M.D.   On: 01/11/2016 21:56   Dg Abd 1 View  01/12/2016  CLINICAL DATA:  65 year old female - NG tube advancement. EXAM: ABDOMEN - 1 VIEW COMPARISON:  01/12/2016 FINDINGS: An NG tube is identified with tip overlying the distal stomach. No other significant abnormalities noted. IMPRESSION: NG tube with tip overlying the distal stomach. Electronically Signed   By: Margarette Canada M.D.   On: 01/12/2016 18:32   Ct Chest Wo Contrast  01/11/2016  CLINICAL DATA:  Advanced endometrial cancer with peritoneal spread. Recent transfusion for anemia, subsequently developing shortness of breath. EXAM: CT CHEST, ABDOMEN AND PELVIS WITHOUT CONTRAST TECHNIQUE: Multidetector CT imaging of the chest, abdomen and pelvis was performed following the standard protocol without IV contrast. COMPARISON:  CT abdomen pelvis with contrast 01/08/2016. FINDINGS: CT CHEST FINDINGS Patient has been intubated. Endotracheal tube is near carina and should be withdrawn 3-4 cm. Port-A-Cath tip unchanged proximal RIGHT atrium. New LEFT IJ dual-lumen venous catheter distal SVC. No pneumothorax. Mediastinum/Lymph Nodes: No masses or pathologically enlarged lymph nodes identified on this un-enhanced exam. Lungs/Pleura: BILATERAL pleural effusions, larger on the RIGHT. Suspected  LEFT greater than RIGHT lower lobe infiltrates. Perihilar opacities suggesting superimposed pulmonary edema.  Musculoskeletal: No chest wall mass or suspicious bone lesions identified. CT ABDOMEN PELVIS FINDINGS Hepatobiliary: No mass visualized on this un-enhanced exam. Pancreas: No mass or inflammatory process identified on this un-enhanced exam. Spleen: Within normal limits in size. Adrenals/Urinary Tract: LEFT hydronephrosis redemonstrated. BILATERAL contrast nephrograms persists from the February 6 scan indicating acute renal failure. Large pelvic mass. Extrinsic mass effect on the bladder with posterior wall invasion and LEFT UVJ mass effect contributes to hydronephrosis. Stomach/Bowel: No evidence of obstruction, inflammatory process, or abnormal fluid collections. Vascular/Lymphatic: No pathologically enlarged lymph nodes. No evidence of abdominal aortic aneurysm. Reproductive: Status post hysterectomy. Widespread omental tumor. Marked worsening of ascites. Other: None. Musculoskeletal:  No suspicious bone lesions identified. IMPRESSION: Marked worsening aeration since priors. Pulmonary edema with superimposed lower lobe infiltrates, LEFT greater than RIGHT. Endotracheal tube too low.  Withdrawal 3-4 cm. Marked worsening ascites. Pelvic tumor redemonstrated with bladder invasion. Persistent contrast nephrograms with LEFT hydronephrosis. Electronically Signed   By: Staci Righter M.D.   On: 01/11/2016 21:56   Dg Chest Port 1 View  01/13/2016  CLINICAL DATA:  Hypoxia EXAM: PORTABLE CHEST 1 VIEW COMPARISON:  January 12, 2016 FINDINGS: Endotracheal tube tip is 3.3 cm above the carina. Dual-lumen central catheter tip in superior vena cava. Port-A-Cath tip in superior vena cava. Nasogastric tube tip and side port in stomach. No pneumothorax. There is slight atelectasis in the lung bases. Lungs elsewhere clear. Heart size and pulmonary vascularity are normal. No adenopathy. There is degenerative change in the  thoracic spine. IMPRESSION: Tube and catheter positions as described without pneumothorax. Lungs are clear except for slight bibasilar atelectasis. Cardiac silhouette within normal limits. Electronically Signed   By: Lowella Grip III M.D.   On: 01/13/2016 07:28   Dg Chest Port 1 View  01/12/2016  CLINICAL DATA:  65 year old female with respiratory failure. Metastatic endometrial cancer. Shortness of breath, intubated. Initial encounter. EXAM: PORTABLE CHEST 1 VIEW COMPARISON:  CT chest abdomen and pelvis 01/11/2016 and earlier. FINDINGS: Portable AP semi upright view at 0454 hours. Endotracheal tube tip now projects in good position just below the clavicles. Right chest porta cath remains in place. Left IJ approach dual lumen dialysis type catheter at the lower SVC level. Normal cardiac size and mediastinal contours. No pneumothorax or pulmonary edema. Confluent bilateral lower lobe consolidation is less apparent than on yesterday CT. Small right pleural effusion on that exam also is not evident today. No areas of worsening ventilation. IMPRESSION: 1. Endotracheal tube tip in good position at the level the clavicles. 2.  Otherwise, stable lines and tubes. 3. No new cardiopulmonary finding. Bilateral lower lobe consolidation compatible with pneumonia. Small right pleural effusion seen yesterday not evident radiographically. Electronically Signed   By: Genevie Ann M.D.   On: 01/12/2016 06:57   Dg Chest Port 1 View  01/11/2016  CLINICAL DATA:  Status post intubation and central line placement EXAM: PORTABLE CHEST 1 VIEW COMPARISON:  January 11, 2016 FINDINGS: Endotracheal tube is identified with distal tip 1.4 cm from carina. Retraction by 2.5 cm is recommended. Dual lumen left jugular central venous line are identified with distal tip in the superior vena cava. Right central venous line is identified with distal tip in the superior vena cava unchanged. There is pulmonary edema. There is no pleural effusion or  focal pneumonia. There is no pneumothorax. The osseous structures are stable. IMPRESSION: Endotracheal tube distal tip 1.5 cm from carina. Retraction by 2.5 cm is recommended. Left central venous line distal tips  in the superior vena cava. No pneumothorax. Pulmonary edema. These results will be called to the ordering clinician or representative by the Radiologist Assistant, and communication documented in the PACS or zVision Dashboard. Electronically Signed   By: Abelardo Diesel M.D.   On: 01/11/2016 21:13   Dg Chest Portable 1 View  01/11/2016  CLINICAL DATA:  History of uterine cancer with abdominal ascites. Patient became hypertensive and diaphoretic during blood transfusion. EXAM: PORTABLE CHEST 1 VIEW COMPARISON:  CT of the chest 01/13/2015 FINDINGS: Right internal jugular approach injectable port terminates within the expected location of superior vena cava. Cardiomediastinal silhouette is normal. Mediastinal contours appear intact. There is no evidence of pneumothorax. Lung volumes are low. There is bilateral interstitial thickening which may be seen with pulmonary vascular congestion. More confluent left lower lobe airspace consolidation cannot be excluded. Osseous structures are without acute abnormality. Soft tissues are grossly normal. IMPRESSION: Low lung volumes with bilateral interstitial thickening, likely representing pulmonary vascular congestion. More confluent left lower lobe airspace consolidation cannot be excluded. Electronically Signed   By: Fidela Salisbury M.D.   On: 01/11/2016 16:51   Dg Abd Portable 1v  01/12/2016  CLINICAL DATA:  NG tube placement. EXAM: PORTABLE ABDOMEN - 1 VIEW COMPARISON:  CT of the abdomen pelvis 01/10/2014 FINDINGS: Enteric catheter tip is seen overlying the mid upper abdomen, location is uncertain as no gastric bubble is seen, however it overlies the expected location of the proximal stomach. There is nonobstructive bowel gas pattern. Hyperdense renal shadows  are seen. Bridging osteophytes of the lumbosacral spine are noted. IMPRESSION: Enteric catheter overlies expected location of the proximal stomach. Its location is uncertain as no gastric bubble is visualized. Nonobstructive bowel gas pattern. Hyperdense appearance of the renal shadows may represent retention of contrast from CT performed 15 hours ago, indicative of renal function impairment. Electronically Signed   By: Fidela Salisbury M.D.   On: 01/12/2016 11:48   I have personally reviewed and evaluated these images and lab results as part of my medical decision-making.  I discussed with critical care who will see the patient emergency room.  At this point, discussed with the daughter and the husband and will proceed with full code.  MDM   Final diagnoses:  Dyspnea  Encounter for intubation  Acute respiratory failure with hypoxemia ([place])  Encounter for nasogastric (NG) tube placement        Leonard Schwartz, MD 01/13/16 1056

## 2016-01-11 NOTE — Telephone Encounter (Signed)
Pt daughter called to confirm appt d/t for blood at [facility]. Writer confirmed pt appt at 12 pm at [facility].  Daughter also asking if appt for 2/10 can be moved to today for paracentesis - she reports her mother is starting to get tight and have some difficulty breathing.  Writer provided patient with number for IR to request an earlier appt.  Pt daughter voiced understanding.

## 2016-01-11 NOTE — Procedures (Signed)
Central Venous Catheter Insertion Procedure Note Brittany Schwartz [id number] 1951-02-23  Procedure: Insertion of Central Venous Catheter Indications: Assessment of intravascular volume, Drug and/or fluid administration, Frequent blood sampling and ,ay need hd  Procedure Details Consent: Risks of procedure as well as the alternatives and risks of each were explained to the (patient/caregiver).  Consent for procedure obtained. Time Out: Verified patient identification, verified procedure, site/side was marked, verified correct patient position, special equipment/implants available, medications/allergies/relevent history reviewed, required imaging and test results available.  Performed  Maximum sterile technique was used including antiseptics, cap, gloves, gown, hand hygiene, mask and sheet. Skin prep: Chlorhexidine; local anesthetic administered A antimicrobial bonded/coated triple lumen catheter was placed in the right internal jugular vein using the Seldinger technique.  Evaluation Blood flow good Complications: No apparent complications Patient did tolerate procedure well. Chest X-ray ordered to verify placement.  CXR: pending.  Brittany Schwartz 01/11/2016, 10:10 PM  [place]  [name]. Titus Mould, MD, [place] Pgr: [facility]

## 2016-01-11 NOTE — Progress Notes (Addendum)
C/O shortness of breath with approx. 30 ml of 2nd unit of PC left to infuse. Lungs clear bilat. VS 156/106, pulse 93, resp. 24, ox 99%. Dr. Marko Plume notified. Blood stopped and o2 at 2l [place] started. Patient voided. Diaphoretic.Dr. Marko Plume wanted a recheck of VS in 15 min. and give her a call.

## 2016-01-11 NOTE — ED Notes (Signed)
Pt continues to report difficulty breathing.  O2 saturation ~91% on Bi-Pap (3L).  She remains clammy and diaphoretic and has only voided once after receiving Lasix.  Family is at the bedside.

## 2016-01-11 NOTE — H&P (Signed)
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  Now with rapid recurrent disease since mid January with large volume malignant ascites, bulky involvement pelvis and abdominal caking.  She is followed by Dr. Marko Plume with oncology and is currently undergoing chemo.  Family reports that oncologist feels as though there is hope for improvement with chemotherapy. She received last dose of chemotherapy 2/8. She had 2u PRBC transfusion 2/9 for symptomatic anemia.  During transfusion, she complained of SOB with diaphoresis.  She was subsequently sent to ED for further evaluation.  In ED, she was hypertensive and remained SOB.  She was therefore placed on BiPAP. CXR showed significant pulmonary edema. Laboratory assessment was significant for renal failure with K 7.2, Na 119 (chronically low), and SCr 3.61. She is also hyperglycemic with Glucose 369. Lab eval also notable for WBC 20.2. CT abdomen from a few days prior consistent with progressive mass size with compression of L ureter and L hydronephrosis. She was unable to tolerate BiPAP in ED and required intubation.  PCCM asked to admit.  Extensive discussion with family by Dr. Titus Mould.  Given Mrs. Jester's hx and current circumstances, decision made to limit code status to intubation and vasopressors only.  In the event of cardiac arrest, we would not put her through CPR or defibrillation.  Full medical management otherwise.  PAST MEDICAL HISTORY :  She  has a past medical history of Hypertension; Glaucoma; Diabetes mellitus without complication ([place]); Palpitations; Muscle cramps; Postmenopausal  vaginal bleeding; and Uterine cancer ([place]).  PAST SURGICAL HISTORY: She  has past surgical history that includes Robotic assisted total hysterectomy with bilateral salpingo oophorectomy (Bilateral, 01/19/2015).  No Known Allergies  Current Facility-Administered Medications on File Prior to Encounter  Medication  . heparin lock flush 100 unit/mL  . sodium chloride flush (NS) 0.9 % injection 10 mL   Current Outpatient Prescriptions on File Prior to Encounter  Medication Sig  . acetaminophen (TYLENOL) 325 MG tablet Take 650 mg by mouth every 6 (six) hours as needed for mild pain, moderate pain or headache. Reported on 01/08/2016  . ferrous fumarate (HEMOCYTE - 106 MG FE) 325 (106 FE) MG TABS tablet Take 1 tablet daily  on an empty stomach with OJ or vitamin C tablet  . glipiZIDE (GLUCOTROL XL) 5 MG 24 hr tablet Take 5 mg by mouth 2 (two) times daily.  Marland Kitchen latanoprost (XALATAN) 0.005 % ophthalmic solution Place 1 drop into both eyes at bedtime.  . lidocaine-prilocaine (EMLA) cream Apply to Porta-Cath 1-2 hrs prior to access as directed.  . magnesium 30 MG tablet Take 30 mg by mouth daily.  Marland Kitchen oxyCODONE (ROXICODONE) 5 MG immediate release tablet Take 1 tablet every 4-6 hours as needed for pain.  . pantoprazole (PROTONIX) 40 MG tablet Take 1 tablet (40 mg total) by mouth daily.  . pravastatin (PRAVACHOL) 20 MG tablet Take 20 mg by mouth daily.  . timolol (BETIMOL) 0.5 % ophthalmic solution Place 1 drop into both eyes 2 (two) times daily.  . feeding supplement, GLUCERNA SHAKE, (GLUCERNA SHAKE) LIQD Take 237 mLs by mouth daily. (Patient not taking: Reported on 01/08/2016)  . furosemide (LASIX) 40 MG  tablet Take 1 tablet (40 mg total) by mouth 2 (two) times daily. (Patient not taking: Reported on 01/08/2016)  . gabapentin (NEURONTIN) 100 MG capsule Take 1 capsule (100 mg total) by mouth at bedtime. Take 1 at bedtime for neuropathy (Patient not taking: Reported on 01/04/2016)  . LORazepam (ATIVAN) 0.5 MG tablet  Place 1 tablet under tongue or swallow every 8 hours as needed for nausea. Will make drowsy. (Patient not taking: Reported on 01/08/2016)  . ondansetron (ZOFRAN) 8 MG tablet Take 1 tablet by mouth every 8 hours as needed for nausea. Will not make drowsy. (Patient not taking: Reported on 01/08/2016)  . prochlorperazine (COMPAZINE) 10 MG tablet Take 1 tablet (10 mg total) by mouth every 6 (six) hours as needed for nausea or vomiting. (Patient not taking: Reported on 01/08/2016)  . protein supplement shake (PREMIER PROTEIN) LIQD Take 237 mLs (8 oz total) by mouth 2 (two) times daily between meals. (Patient not taking: Reported on 01/11/2016)    FAMILY HISTORY:  Her has no family status information on file.   SOCIAL HISTORY: She  reports that she has never smoked. She has never used smokeless tobacco. She reports that she does not drink alcohol or use illicit drugs.  REVIEW OF SYSTEMS:   Unable due to AMS and profound respiratory distress  SUBJECTIVE:    VITAL SIGNS: BP 116/71 mmHg  Pulse 82  Resp 29  SpO2 97%  HEMODYNAMICS:    VENTILATOR SETTINGS: Vent Mode:  [-]  FiO2 (%):  [40 %-50 %] 50 %  INTAKE / OUTPUT:    PHYSICAL EXAMINATION: General:  Female of normal body habitus in NAD Neuro:  Lethargic, responds briefly to verbal.  HEENT:  [place]/At, PERRL , no appreciable JVD Cardiovascular:  Tachy, regular, no MRG Lungs:  Coarse crackles bilaterally.  Abdomen:  Distended, fluctuant, diffusely tender.  Musculoskeletal:  No acute deformity Skin:  Grossly intact  LABS:  BMET  Recent Labs Lab 01/06/16 0900  01/07/16 0530 01/07/16 1130 01/08/16 1132 01/11/16 1652  NA 119*  < > 121* 124* 122 Repeated and Verified* 119*  K 4.2  --  4.2  --  4.7 7.2*  CL 96*  --  96*  --   --  93*  CO2 16*  --  19*  --  16* 13*  BUN 24*  --  28*  --  30.0* 61*  CREATININE 1.43*  --  1.48*  --  1.6* 3.61*  GLUCOSE 199*  --  182*  --  260* 369*  < > = values in this interval not  displayed.  Electrolytes  Recent Labs Lab 01/07/16 0530 01/08/16 1132 01/11/16 1652  CALCIUM 7.7* 8.3* 8.6*    CBC  Recent Labs Lab 01/08/16 1132 01/10/16 1323 01/11/16 1652  WBC 15.5* 15.2* 20.2*  HGB 9.9* 8.6* 12.9  HCT 30.2* 26.8* 37.6  PLT 242 245 257    Coag's No results for input(s): APTT, INR in the last 168 hours.  Sepsis Markers  Recent Labs Lab 01/04/16 2113 01/05/16 0220 01/05/16 0348  LATICACIDVEN 2.05* 1.4 1.4    ABG No results for input(s): PHART, PCO2ART, PO2ART in the last 168 hours.  Liver Enzymes  Recent Labs Lab 01/04/16 2108 01/08/16 1132 01/11/16 1652  AST 17 16 25   ALT 9* 11 14  ALKPHOS 37* 73 93  BILITOT 0.7 0.71 0.9  ALBUMIN 1.9* 1.7* 2.1*    Cardiac Enzymes No results for input(s): TROPONINI, PROBNP in the last 168 hours.  Glucose  Recent Labs Lab 01/06/16 1131 01/06/16 1726 01/06/16 2205 01/07/16 0754 01/07/16 1129 01/11/16 1600  GLUCAP 240* 192* 294* 219* 224* 278*    Imaging Dg Chest Portable 1 View  01/11/2016  CLINICAL DATA:  History of uterine cancer with abdominal ascites. Patient became hypertensive and diaphoretic during blood transfusion. EXAM: PORTABLE CHEST 1 VIEW COMPARISON:  CT of the chest 01/13/2015 FINDINGS: Right internal jugular approach injectable port terminates within the expected location of superior vena cava. Cardiomediastinal silhouette is normal. Mediastinal contours appear intact. There is no evidence of pneumothorax. Lung volumes are low. There is bilateral interstitial thickening which may be seen with pulmonary vascular congestion. More confluent left lower lobe airspace consolidation cannot be excluded. Osseous structures are without acute abnormality. Soft tissues are grossly normal. IMPRESSION: Low lung volumes with bilateral interstitial thickening, likely representing pulmonary vascular congestion. More confluent left lower lobe airspace consolidation cannot be excluded. Electronically  Signed   By: Fidela Salisbury M.D.   On: 01/11/2016 16:51     STUDIES:  CXR 02/09 > low volumes, pulm vascular congestion.  CULTURES: Blood 02/09 > Urine 02/09 >  ANTIBIOTICS: Vanc 02/09 > Zosyn 02/09 >  SIGNIFICANT EVENTS: 02/09 > admitted with acute hypoxic respiratory failure, multiple electrolyte derangements.  LINES/TUBES: ETT 02/09 > R IJ HD cath 02/09 >   ASSESSMENT / PLAN:  PULMONARY A: Acute hypoxic respiratory failure - likely due to PRBC transfusion. Pulmonary edema vs TRALI. P:   STAT intubation. Follow up ABG. Wean as able. CT chest. Furosemide restricted given hypotension. Albuterol PRN.  HEMATOLOGIC / ONCOLOGIC A:   Grade 3 serous/ endometrioid endometrial carcinoma IIIA - currently undergoing chemo under the care of Dr. Marko Plume. VTE prophylaxis. P:  Day team to please notify oncology in AM. SCD's / Heparin.  CARDIOVASCULAR A:  Hypotension - sedation related vs occult shock (doubtful). Hx HTN. P:  Levophed as needed to maintain goal MAP > 65. Goal CVP 8 - 12. Assess troponin, lactate. Furosemide restricted given hypotension.  RENAL A:   Acute renal failure, post-renal in setting of obstructing pelvic mass. Hyperkalemia (temoporised in ED). Hyponatremia (chronic, recent baseline 122). NAG acidosis. Pseudohypocalcemia - corrects to 10.12. L hydronephrosis. P:   HCO3 @ 125. Assess ionized calcium. Frequent BMP's - q2hrs.  GASTROINTESTINAL A:   VTE prophylaxis. Nutrition. P:   CT abdomen pelvis w/o contrast. SUP: pantoprazole. NPO.  INFECTIOUS A:   R/o septic shock. P:   Abx as above (vanc / zosyn). Follow cultures. PCT not useful given immunocompromised state.  ENDOCRINE A:   DM. P:   SSI. Hold outpatient antihyperglycemic agents.  NEUROLOGIC A:   Acute metabolic encephalopathy. P:   Sedation: fentanyl gtt / midazolam PRN. RASS goal: 0 to -1. Daily WUA. Hold outpatient oxycodone, gabapentin, lorazepam,  compazine.   Family Updates: Son and others updated by Dr. Titus Mould.  Given Mrs. Roderick's hx and current circumstances, decision made to limit code status to intubation and vasopressors only.  In the event of cardiac arrest, we would not put her through CPR or defibrillation.  Full medical management otherwise.  Inter-disciplinary family meet or Palliative Care meeting due by:  01/17/16.  CC time: 45 minutes.   Montey Hora, [place] [facility] Pager: [phone]  or [phone] 01/11/2016, 9:35 PM  STAFF NOTE: I, Merrie Roof, MD FACP have personally reviewed patient's available data, including medical history, events of note, physical examination and test results as part  of my evaluation. I have discussed with resident/NP and other care providers such as pharmacist, RN and RRT. In addition, I personally evaluated patient and elicited key findings of: moderate to severe distress, abdo distended with fluid wave,unresponsive to stimuli, hyperkalemia, ARF likely obstruction r/o ATN, family described limited discussions with oncology on long term prognosis, pt just receieved chemo and had pracentesis and prbc, I have had extensive discussions with family . We discussed patients current circumstances and organ failures. We also discussed patient's prior wishes under circumstances such as this. Family has decided to NOT perform resuscitation if arrest but to continue current medical support for now. Will support now with ETT, HD cath for line as will access line for fluids, cvp etc but if K not improved may need HD, follow K , likely will involve renal, get CT abdo pelvis for obstruction progression, if noted would need stents by urology, already had obstruction on CT 6th, will add chest to assess edema vs effusions, now drop in BP, get LActic stat, add empiric vanc, zosyn for concerns obstruction, need UA still, does not meet sirs criteria and favor gross volume  overload and pulm edema, add levophed now with drop in BP, frequent K  / bmet needed, ensure BC, urine done as immunocompromised host, increase rate on vent as was hyperk and likely was acidotic, prognosis is poor, have updated family in full, would give stres roids, hyperk, hyponatremia, shock now, bicarb drip for NONAG, and aggressive medical treatment K  The patient is critically ill with multiple organ systems failure and requires high complexity decision making for assessment and support, frequent evaluation and titration of therapies, application of advanced monitoring technologies and extensive interpretation of multiple databases.   Critical Care Time devoted to patient care services described in this note is85 Minutes. This time reflects time of care of this signee: Merrie Roof, MD FACP. This critical care time does not reflect procedure time, or teaching time or supervisory time of PA/NP/Med student/Med Resident etc but could involve care discussion time. Rest per NP/medical resident whose note is outlined above and that I agree with   [name]. Titus Mould, MD, [place] Pgr: [place] [facility] 01/11/2016 10:11 PM

## 2016-01-11 NOTE — Progress Notes (Signed)
eLink Physician-Brief Progress Note Patient Name: Makayela Harner DOB: 1951-09-29 MRN: [id number]   Date of Service  01/11/2016  HPI/Events of Note  ABG on 100%/PRVC 26/TV 400/P 5 = 7.20/34/391. Patient is already on a NaHCO3 IV infusion.   eICU Interventions  Will order: 1. Increase PRVC rate to 35. 2. NaHCO3 100 meq IV now.  3. ABG at 12 midnight.      Intervention Category Major Interventions: Acid-Base disturbance - evaluation and management;Respiratory failure - evaluation and management  Sommer,Steven Cornelia Copa 01/11/2016, 10:21 PM

## 2016-01-11 NOTE — ED Notes (Signed)
Critical care at bedside  

## 2016-01-11 NOTE — ED Notes (Signed)
Notified respiratory therapist of order for Albuterol.

## 2016-01-11 NOTE — ED Notes (Signed)
[facility] was [facility] receiving [facility] blood transfusion and towards the end of second unit patient became diaphoretic and hypertensive with systolic of [id number]. Patient also has audible wheezing upon arrival to ED. Patient has history of uterine cancer and ascites. 4 Liters was taken off her abdomen on Tuesday.

## 2016-01-11 NOTE — Progress Notes (Addendum)
Patient remains short of breath. Daughter wanted blood sugar checked. Blood sugar (FS) 278. Pt. Remains diaphoretic.  Notified Dr. Marko Plume. Orders received to take patient to ED for evaluation.   Patient and daughter informed and patient transferred to ED.

## 2016-01-11 NOTE — Procedures (Signed)
Intubation Procedure Note Dayatra Riedell [id number] 1951-09-15  Procedure: Intubation Indications: Respiratory insufficiency  Procedure Details Consent: Risks of procedure as well as the alternatives and risks of each were explained to the (patient/caregiver).  Consent for procedure obtained. Time Out: Verified patient identification, verified procedure, site/side was marked, verified correct patient position, special equipment/implants available, medications/allergies/relevent history reviewed, required imaging and test results available.  Performed  Maximum sterile technique was used including gloves, gown, hand hygiene and mask.  MAC and 4    Evaluation Hemodynamic Status: BP stable throughout; O2 sats: stable throughout Patient's Current Condition: stable Complications: No apparent complications Patient did tolerate procedure well. Chest X-ray ordered to verify placement.  CXR: pending.   [name] 01/11/2016

## 2016-01-11 NOTE — ED Notes (Signed)
Pt reports increased difficulty breathing despite normalizing BP.  Respiratory assessment reveals coarse crackles throughout.  She is diaphoretic and has not yet needed to void after receiving Lasix.  MD notified of pt condition.

## 2016-01-11 NOTE — Progress Notes (Signed)
BG checked and 278 at this time

## 2016-01-11 NOTE — Progress Notes (Signed)
PCP: Dr. Marko Plume  Associated Diagnosis: Symptomatic anemia (285.9)  Procedure Note: Patient here for Transfusion of 2 units PRBC.  Condition during Procedure: Tolerated procedure well until 2nd unit almost completely infused. Approx. 30 ml of blood remaining when patient complained of shortness of breath. Lungs clear. O2 sat 99%. Assisted patient to a more upright position. Dr. Marko Plume notified. Order for oxygen received and O2 at 2 liters started.  Patient did not improve in 15 min. Dr. Marko Plume notified and patient transported to ED with daughter present.

## 2016-01-11 NOTE — ED Notes (Signed)
Notified Dr Audie Pinto of pt lab values.

## 2016-01-11 NOTE — Progress Notes (Signed)
Rt placed pt on BIPAP in ED per MD order.  

## 2016-01-12 ENCOUNTER — Other Ambulatory Visit: Payer: BLUE CROSS/BLUE SHIELD

## 2016-01-12 ENCOUNTER — Ambulatory Visit (HOSPITAL_COMMUNITY): Admission: RE | Admit: 2016-01-12 | Payer: BLUE CROSS/BLUE SHIELD | Source: Ambulatory Visit

## 2016-01-12 ENCOUNTER — Ambulatory Visit: Payer: BLUE CROSS/BLUE SHIELD | Admitting: Oncology

## 2016-01-12 ENCOUNTER — Inpatient Hospital Stay (HOSPITAL_COMMUNITY): Payer: BLUE CROSS/BLUE SHIELD

## 2016-01-12 ENCOUNTER — Encounter (HOSPITAL_COMMUNITY): Payer: BLUE CROSS/BLUE SHIELD

## 2016-01-12 DIAGNOSIS — D649 Anemia, unspecified: Secondary | ICD-10-CM

## 2016-01-12 DIAGNOSIS — E43 Unspecified severe protein-calorie malnutrition: Secondary | ICD-10-CM

## 2016-01-12 DIAGNOSIS — C541 Malignant neoplasm of endometrium: Secondary | ICD-10-CM

## 2016-01-12 DIAGNOSIS — A419 Sepsis, unspecified organism: Principal | ICD-10-CM

## 2016-01-12 DIAGNOSIS — R6521 Severe sepsis with septic shock: Secondary | ICD-10-CM

## 2016-01-12 DIAGNOSIS — E871 Hypo-osmolality and hyponatremia: Secondary | ICD-10-CM

## 2016-01-12 DIAGNOSIS — J9601 Acute respiratory failure with hypoxia: Secondary | ICD-10-CM

## 2016-01-12 DIAGNOSIS — E119 Type 2 diabetes mellitus without complications: Secondary | ICD-10-CM

## 2016-01-12 DIAGNOSIS — N179 Acute kidney failure, unspecified: Secondary | ICD-10-CM

## 2016-01-12 DIAGNOSIS — C7989 Secondary malignant neoplasm of other specified sites: Secondary | ICD-10-CM

## 2016-01-12 DIAGNOSIS — J910 Malignant pleural effusion: Secondary | ICD-10-CM

## 2016-01-12 LAB — BLOOD GAS, ARTERIAL
ACID-BASE DEFICIT: 7.2 mmol/L — AB (ref 0.0–2.0)
Acid-base deficit: 9.5 mmol/L — ABNORMAL HIGH (ref 0.0–2.0)
BICARBONATE: 16.6 meq/L — AB (ref 20.0–24.0)
BICARBONATE: 15.6 meq/L — AB (ref 20.0–24.0)
DRAWN BY: 308601
Drawn by: 308601
FIO2: 0.5
FIO2: 0.7
LHR: 35 {breaths}/min
O2 Saturation: 98.9 %
O2 Saturation: 99.2 %
PATIENT TEMPERATURE: 98.6
PEEP/CPAP: 5 cmH2O
PEEP/CPAP: 5 cmH2O
Patient temperature: 34.6
RATE: 35 resp/min
TCO2: 15.2 mmol/L (ref 0–100)
TCO2: 14.6 mmol/L (ref 0–100)
VT: 410 mL
VT: 410 mL
pCO2 arterial: 26.5 mmHg — ABNORMAL LOW (ref 35.0–45.0)
pCO2 arterial: 33.2 mmHg — ABNORMAL LOW (ref 35.0–45.0)
pH, Arterial: 7.294 — ABNORMAL LOW (ref 7.350–7.450)
pH, Arterial: 7.4 (ref 7.350–7.450)
pO2, Arterial: 152 mmHg — ABNORMAL HIGH (ref 80.0–100.0)
pO2, Arterial: 220 mmHg — ABNORMAL HIGH (ref 80.0–100.0)

## 2016-01-12 LAB — TYPE AND SCREEN
ABO/RH(D): B POS
ANTIBODY SCREEN: NEGATIVE
Unit division: 0
Unit division: 0

## 2016-01-12 LAB — GLUCOSE, CAPILLARY
GLUCOSE-CAPILLARY: 366 mg/dL — AB (ref 65–99)
GLUCOSE-CAPILLARY: 279 mg/dL — AB (ref 65–99)
GLUCOSE-CAPILLARY: 129 mg/dL — AB (ref 65–99)
Glucose-Capillary: 181 mg/dL — ABNORMAL HIGH (ref 65–99)
Glucose-Capillary: 356 mg/dL — ABNORMAL HIGH (ref 65–99)
Glucose-Capillary: 88 mg/dL (ref 65–99)

## 2016-01-12 LAB — URINALYSIS, ROUTINE W REFLEX MICROSCOPIC
Bilirubin Urine: NEGATIVE
GLUCOSE, UA: 250 mg/dL — AB
Ketones, ur: NEGATIVE mg/dL
NITRITE: NEGATIVE
PH: 5 (ref 5.0–8.0)
Protein, ur: 30 mg/dL — AB
SPECIFIC GRAVITY, URINE: 1.017 (ref 1.005–1.030)

## 2016-01-12 LAB — RENAL FUNCTION PANEL
ALBUMIN: 1.8 g/dL — AB (ref 3.5–5.0)
ANION GAP: 12 (ref 5–15)
ANION GAP: 12 (ref 5–15)
Albumin: 1.8 g/dL — ABNORMAL LOW (ref 3.5–5.0)
BUN: 56 mg/dL — AB (ref 6–20)
BUN: 52 mg/dL — ABNORMAL HIGH (ref 6–20)
CALCIUM: 8 mg/dL — AB (ref 8.9–10.3)
CHLORIDE: 95 mmol/L — AB (ref 101–111)
CHLORIDE: 98 mmol/L — AB (ref 101–111)
CO2: 19 mmol/L — ABNORMAL LOW (ref 22–32)
CO2: 21 mmol/L — ABNORMAL LOW (ref 22–32)
CREATININE: 2.89 mg/dL — AB (ref 0.44–1.00)
Calcium: 8.1 mg/dL — ABNORMAL LOW (ref 8.9–10.3)
Creatinine, Ser: 3.13 mg/dL — ABNORMAL HIGH (ref 0.44–1.00)
GFR calc Af Amer: 17 mL/min — ABNORMAL LOW (ref >60)
GFR calc non Af Amer: 15 mL/min — ABNORMAL LOW (ref >60)
GFR, EST AFRICAN AMERICAN: 19 mL/min — AB (ref >60)
GFR, EST NON AFRICAN AMERICAN: 16 mL/min — AB (ref >60)
GLUCOSE: 267 mg/dL — AB (ref 65–99)
Glucose, Bld: 142 mg/dL — ABNORMAL HIGH (ref 65–99)
PHOSPHORUS: 4.6 mg/dL (ref 2.5–4.6)
POTASSIUM: 5.8 mmol/L — AB (ref 3.5–5.1)
Phosphorus: 5.2 mg/dL — ABNORMAL HIGH (ref 2.5–4.6)
Potassium: 5.9 mmol/L — ABNORMAL HIGH (ref 3.5–5.1)
Sodium: 126 mmol/L — ABNORMAL LOW (ref 135–145)
Sodium: 131 mmol/L — ABNORMAL LOW (ref 135–145)

## 2016-01-12 LAB — BASIC METABOLIC PANEL
ANION GAP: 12 (ref 5–15)
ANION GAP: 11 (ref 5–15)
BUN: 63 mg/dL — ABNORMAL HIGH (ref 6–20)
BUN: 64 mg/dL — ABNORMAL HIGH (ref 6–20)
CHLORIDE: 92 mmol/L — AB (ref 101–111)
CHLORIDE: 94 mmol/L — AB (ref 101–111)
CO2: 17 mmol/L — AB (ref 22–32)
CO2: 19 mmol/L — AB (ref 22–32)
Calcium: 7.8 mg/dL — ABNORMAL LOW (ref 8.9–10.3)
Calcium: 7.9 mg/dL — ABNORMAL LOW (ref 8.9–10.3)
Creatinine, Ser: 3.73 mg/dL — ABNORMAL HIGH (ref 0.44–1.00)
Creatinine, Ser: 3.64 mg/dL — ABNORMAL HIGH (ref 0.44–1.00)
GFR calc non Af Amer: 12 mL/min — ABNORMAL LOW (ref >60)
GFR calc non Af Amer: 12 mL/min — ABNORMAL LOW (ref >60)
GFR, EST AFRICAN AMERICAN: 14 mL/min — AB (ref >60)
GFR, EST AFRICAN AMERICAN: 14 mL/min — AB (ref >60)
Glucose, Bld: 399 mg/dL — ABNORMAL HIGH (ref 65–99)
Glucose, Bld: 382 mg/dL — ABNORMAL HIGH (ref 65–99)
Potassium: 6.3 mmol/L (ref 3.5–5.1)
Potassium: 6.3 mmol/L (ref 3.5–5.1)
Sodium: 121 mmol/L — ABNORMAL LOW (ref 135–145)
Sodium: 124 mmol/L — ABNORMAL LOW (ref 135–145)

## 2016-01-12 LAB — MRSA PCR SCREENING: MRSA by PCR: NEGATIVE

## 2016-01-12 LAB — URINE MICROSCOPIC-ADD ON

## 2016-01-12 LAB — CBC
HCT: 32.8 % — ABNORMAL LOW (ref 36.0–46.0)
Hemoglobin: 11.6 g/dL — ABNORMAL LOW (ref 12.0–15.0)
MCH: 30.9 pg (ref 26.0–34.0)
MCHC: 35.4 g/dL (ref 30.0–36.0)
MCV: 87.2 fL (ref 78.0–100.0)
PLATELETS: 216 10*3/uL (ref 150–400)
RBC: 3.76 MIL/uL — AB (ref 3.87–5.11)
RDW: 13.2 % (ref 11.5–15.5)
WBC: 18.2 10*3/uL — AB (ref 4.0–10.5)

## 2016-01-12 LAB — MAGNESIUM
MAGNESIUM: 1.9 mg/dL (ref 1.7–2.4)
MAGNESIUM: 2.1 mg/dL (ref 1.7–2.4)

## 2016-01-12 LAB — PHOSPHORUS: PHOSPHORUS: 5.9 mg/dL — AB (ref 2.5–4.6)

## 2016-01-12 MED ORDER — VANCOMYCIN HCL IN DEXTROSE 1-5 GM/200ML-% IV SOLN
1000.0000 mg | INTRAVENOUS | Status: DC
  Administered 2016-01-13: 1000 mg via INTRAVENOUS
  Filled 2016-01-12: qty 200

## 2016-01-12 MED ORDER — SODIUM CHLORIDE 0.9% FLUSH
10.0000 mL | Freq: Two times a day (BID) | INTRAVENOUS | Status: DC
  Administered 2016-01-12 – 2016-01-13 (×5): 10 mL
  Administered 2016-01-14: 20 mL
  Administered 2016-01-15 – 2016-01-28 (×18): 10 mL

## 2016-01-12 MED ORDER — PRISMASOL BGK 0/2.5 32-2.5 MEQ/L IV SOLN
INTRAVENOUS | Status: DC
  Administered 2016-01-12 – 2016-01-17 (×13): via INTRAVENOUS_CENTRAL
  Filled 2016-01-12 (×16): qty 5000

## 2016-01-12 MED ORDER — VITAMINS A & D EX OINT
TOPICAL_OINTMENT | CUTANEOUS | Status: AC
  Administered 2016-01-12: 1
  Filled 2016-01-12: qty 5

## 2016-01-12 MED ORDER — CHLORHEXIDINE GLUCONATE 0.12% ORAL RINSE (MEDLINE KIT)
15.0000 mL | Freq: Two times a day (BID) | OROMUCOSAL | Status: DC
  Administered 2016-01-12 – 2016-01-14 (×6): 15 mL via OROMUCOSAL

## 2016-01-12 MED ORDER — ANTISEPTIC ORAL RINSE SOLUTION (CORINZ)
7.0000 mL | Freq: Four times a day (QID) | OROMUCOSAL | Status: DC
  Administered 2016-01-12 – 2016-01-14 (×10): 7 mL via OROMUCOSAL

## 2016-01-12 MED ORDER — PRISMASOL BGK 4/2.5 32-4-2.5 MEQ/L IV SOLN
INTRAVENOUS | Status: DC
  Administered 2016-01-12 – 2016-01-22 (×65): via INTRAVENOUS_CENTRAL
  Filled 2016-01-12 (×83): qty 5000

## 2016-01-12 MED ORDER — ALTEPLASE 100 MG IV SOLR
2.0000 mg | Freq: Once | INTRAVENOUS | Status: AC
  Administered 2016-01-12: 2 mg
  Filled 2016-01-12: qty 2

## 2016-01-12 MED ORDER — PANTOPRAZOLE SODIUM 40 MG IV SOLR
40.0000 mg | Freq: Every day | INTRAVENOUS | Status: DC
  Administered 2016-01-12 – 2016-01-14 (×4): 40 mg via INTRAVENOUS
  Filled 2016-01-12 (×4): qty 40

## 2016-01-12 MED ORDER — SODIUM CHLORIDE 0.9% FLUSH
10.0000 mL | INTRAVENOUS | Status: DC | PRN
  Administered 2016-01-28: 10 mL
  Filled 2016-01-12: qty 40

## 2016-01-12 MED ORDER — PRISMASOL BGK 0/2.5 32-2.5 MEQ/L IV SOLN
INTRAVENOUS | Status: DC
  Administered 2016-01-12 – 2016-01-16 (×7): via INTRAVENOUS_CENTRAL
  Filled 2016-01-12 (×10): qty 5000

## 2016-01-12 MED ORDER — HEPARIN (PORCINE) 2000 UNITS/L FOR CRRT
INTRAVENOUS_CENTRAL | Status: DC | PRN
  Administered 2016-01-14: 10:00:00 via INTRAVENOUS_CENTRAL
  Administered 2016-01-16: 1000 mL via INTRAVENOUS_CENTRAL
  Administered 2016-01-18 – 2016-01-21 (×2): via INTRAVENOUS_CENTRAL
  Filled 2016-01-12 (×4): qty 1000

## 2016-01-12 MED ORDER — PIPERACILLIN-TAZOBACTAM 3.375 G IVPB 30 MIN
3.3750 g | Freq: Four times a day (QID) | INTRAVENOUS | Status: DC
  Administered 2016-01-12 – 2016-01-21 (×37): 3.375 g via INTRAVENOUS
  Filled 2016-01-12 (×71): qty 50

## 2016-01-12 MED ORDER — HEPARIN SODIUM (PORCINE) 1000 UNIT/ML DIALYSIS
1000.0000 [IU] | INTRAMUSCULAR | Status: DC | PRN
  Administered 2016-01-13 (×2): 1400 [IU] via INTRAVENOUS_CENTRAL
  Administered 2016-01-21: 2400 [IU] via INTRAVENOUS_CENTRAL
  Administered 2016-01-22: 2800 [IU] via INTRAVENOUS_CENTRAL
  Filled 2016-01-12: qty 1
  Filled 2016-01-12 (×4): qty 6
  Filled 2016-01-12: qty 2
  Filled 2016-01-12: qty 4
  Filled 2016-01-12 (×2): qty 6

## 2016-01-12 NOTE — Progress Notes (Signed)
ANTIBIOTIC CONSULT NOTE - follow up  Pharmacy Consult for vancomycin and zosyn Indication: empiric  No Known Allergies  Patient Measurements: weight 74 kg, height 61 inches Height: 5\' 2"  (157.5 cm) Weight: 173 lb 4.5 oz (78.6 kg) IBW/kg (Calculated) : 50.1  Vital Signs: Temp: 98.8 F (37.1 C) (02/10 1200) Temp Source: Core (Comment) (02/10 0400) BP: 86/54 mmHg (02/10 1200) Pulse Rate: 90 (02/10 1150) Intake/Output from previous day: 02/09 0701 - 02/10 0700 In: 1177.7 [I.V.:877.7; IV Piggyback:300] Out: 141 [Urine:82] Intake/Output from this shift: Total I/O In: 169 [I.V.:119; IV Piggyback:50] Out: 524 [Urine:8; Other:516]  Labs:  Recent Labs  01/10/16 1323 01/11/16 1652  01/12/16 0122 01/12/16 0322 01/12/16 0756  WBC 15.2* 20.2*  --   --  18.2*  --   HGB 8.6* 12.9  --   --  11.6*  --   PLT 245 257  --   --  216  --   CREATININE  --  3.61*  < > 3.73* 3.64* 3.13*  < > = values in this interval not displayed. Estimated Creatinine Clearance: 17.4 mL/min (by C-G formula based on Cr of 3.13). No results for input(s): VANCOTROUGH, VANCOPEAK, VANCORANDOM, GENTTROUGH, GENTPEAK, GENTRANDOM, TOBRATROUGH, TOBRAPEAK, TOBRARND, AMIKACINPEAK, AMIKACINTROU, AMIKACIN in the last 72 hours.   Microbiology: Recent Results (from the past 720 hour(s))  Urine culture     Status: None   Collection Time: 01/04/16 10:50 AM  Result Value Ref Range Status   Specimen Description URINE, RANDOM  Final   Special Requests NONE  Final   Culture   Final    1,000 COLONIES/mL INSIGNIFICANT GROWTH Performed at [facility]    Report Status 01/05/2016 FINAL  Final  Culture, blood (routine x 2)     Status: None   Collection Time: 01/05/16  2:20 AM  Result Value Ref Range Status   Specimen Description BLOOD LEFT ANTECUBITAL  Final   Special Requests IN PEDIATRIC BOTTLE 4ML  Final   Culture   Final    NO GROWTH 5 DAYS Performed at [facility]    Report Status 01/10/2016 FINAL   Final  Culture, blood (routine x 2)     Status: None   Collection Time: 01/05/16  2:26 AM  Result Value Ref Range Status   Specimen Description BLOOD BLOOD LEFT FOREARM  Final   Special Requests IN PEDIATRIC BOTTLE 4ML  Final   Culture   Final    NO GROWTH 5 DAYS Performed at [facility]    Report Status 01/10/2016 FINAL  Final  MRSA PCR Screening     Status: None   Collection Time: 01/12/16 12:01 AM  Result Value Ref Range Status   MRSA by PCR NEGATIVE NEGATIVE Final    Comment:        The GeneXpert MRSA Assay (FDA approved for NASAL specimens only), is one component of a comprehensive MRSA colonization surveillance program. It is not intended to diagnose MRSA infection nor to guide or monitor treatment for MRSA infections.     Medical History: Past Medical History  Diagnosis Date  . Hypertension   . Glaucoma   . Diabetes mellitus without complication ([place])   . Palpitations   . Muscle cramps   . Postmenopausal vaginal bleeding   . Uterine cancer [facility])    Assessment: Patient is a 65 y.o F with hx of uterine cancer currently undergoing chemotherapy and ascites who presented to the ED from sickle cell center after becoming diaphoretic and hypertensive while receiving  blood transfusion.  Patient's now intubated.  Broad abx with vancomycin and zosyn for empiric coverage started 2/9  Today, 01/12/2016  Patient now on CRRT started AM 2/10  WBC slightly improved  Afebrile  Antimicrobials this admission: 2/9 vanc>> 2/9 zosyn>>    Goal of Therapy:  Vancomycin trough level 15-20 mcg/ml  Plan:  1) With start of CRRT, will change Zosyn per guidelines to 3.375g IV q6 2) With start of CRRT, will start vancomycin 1g IV q24 to begin tomorrow 2/11 AM   Adrian Saran, PharmD, BCPS Pager [phone] 01/12/2016 1:04 PM

## 2016-01-12 NOTE — Progress Notes (Signed)
CRT restarted

## 2016-01-12 NOTE — Care Management Note (Signed)
Case Management Note  Patient Details  Name: Brittany Schwartz MRN: [id number] Date of Birth: 10/19/1951  Subjective/Objective:       Acute resp failure requiring intubation             Action/Plan:Date: January 12, 2016 Chart reviewed for concurrent status and case management needs. Will continue to follow patient for changes and needs: Velva Harman, BSN, RN, [place]   [phone]   Expected Discharge Date:   (UNKNOWN)               Expected Discharge Plan:  Home/Self Care  In-House Referral:  NA  Discharge planning Services  CM Consult  Post Acute Care Choice:  NA Choice offered to:  NA  DME Arranged:    DME Agency:     HH Arranged:    [place] Agency:     Status of Service:  Completed, signed off  Medicare Important Message Given:    Date Medicare IM Given:    Medicare IM give by:    Date Additional Medicare IM Given:    Additional Medicare Important Message give by:     If discussed at [place] of Stay Meetings, dates discussed:    Additional Comments:  Leeroy Cha, RN 01/12/2016, 9:59 AM

## 2016-01-12 NOTE — Progress Notes (Signed)
eLink Physician-Brief Progress Note Patient Name: Brittany Schwartz DOB: January 19, 1951 MRN: [id number]   Date of Service  01/12/2016  HPI/Events of Note  Persistent hyperkalemia despite sodium bicarb drip. Oliguric UOP.  eICU Interventions  1. Contacted Nephrology  for possible CVVHD. 2. Lengthy family discussion at bedside updating & discussing goals of care.     Intervention Category Major Interventions: Electrolyte abnormality - evaluation and management  Tera Partridge 01/12/2016, 2:58 AM

## 2016-01-12 NOTE — Progress Notes (Signed)
Inpatient Diabetes Program Recommendations  AACE/ADA: New Consensus Statement on Inpatient Glycemic Control (2015)  Target Ranges:  Prepandial:   less than 140 mg/dL      Peak postprandial:   less than 180 mg/dL (1-2 hours)      Critically ill patients:  140 - 180 mg/dL   Results for Brittany Schwartz, Brittany Schwartz (MRN [id number]) as of 01/12/2016 08:32  Ref. Range 01/11/2016 16:00 01/11/2016 21:59 01/12/2016 03:09 01/12/2016 07:32  Glucose-Capillary Latest Ref Range: 65-99 mg/dL 278 (H) 347 (H) 366 (H) 279 (H)    Admit with: Respiratory Distress/ Acute Renal Failure  History: DM, Endometrial Cancer  Home DM Meds: Glipizide 5 mg bid       Metformin 1000 mg bid  Current Insulin Orders: Novolog Moderate SSI (0-15 units) Q4 hours      -Note patient currently getting IV Solucortef 50 mg Q6 hours.  -Glucose levels 200-300 mg/dl range.  -Note patient critically ill and may require initiation of CVVHD per renal notes.    MD- If within goals of care for this patient, please consider the following in-hospital insulin adjustments:  Start ICU Glycemic Control Protocol- Phase 2 IV insulin  OR  Start Levemir 15 units daily (0.2 units/kg dosing) + Increase Novolog SSI to Resistant scale (0-20 units) Q4 hours while patient getting steroids     --Will follow patient during hospitalization--  Wyn Quaker RN, MSN, CDE Diabetes Coordinator Inpatient Glycemic Control Team Team Pager: [phone] (8a-5p)

## 2016-01-12 NOTE — Progress Notes (Signed)
Medical Oncology January 12, 2016, 8:11 AM  Hospital day 2 Antibiotics: zosyn Chemotherapy: first adriamycin 01-10-16, salvage attempt. She has not had gCSF. 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 One daughter at bedside.  Subjective: Opens eyes to voice, responds appropriately with head nods, does not appear in pain and seems to drowse asleep when not disturbed. Has denied pain to RN.  Per daughter, more comfortable for ~ 24 hours after 01-09-16 paracentesis, then abdominal distension again. Bowels moved with increased laxatives 01-09-16.  Echocardiogram 01-02-16 with EF 65-70% PAC in Flu vaccine 01-08-16 Bilat LE venous dopplers negative clot 01-08-16  ONCOLOGIC HISTORY Paient presented to Dr [name] 01-03-15 with vaginal spotting x 1 year. Pelvic US showed 33 mm endometrial stripe with ovaries normal, and endometrial biopsy  01-03-15 grade 3 endometrial adenocarcinoma.  CT CAP 01-13-15 had negative chest, no adenopathy, mild fatty liver, markedly thickened endometrium and no evidence of metastatic disease outside of uterus. CA 125 preoperatively on 01-13-15 was 22.9. Surgery by Dr [name] 01-19-15 was robotic hysterectomy, BSO and resection of nodule at sigmoid serosa. At surgery there was milial tumor studding with 5-10 mm implants on bladder peritoneum and miliary disease on pelvic sidewalls, without upper abdominal disease evident, and omentum appeared normal. 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 Recommendation for 6 cycles of taxol carboplatin, then consideration of external beam RT + vaginal brachytherapy if CR. First  carboplatin taxol was given 02-16-2015; she was neutropenic with ANC 0.7 by day 15 cycle 1. She was transfused 2 units PRBCs 5-19/5-20-16 for Hgb 7.8. Cycles 4 and 5 were both delayed due to thrombocytopenia. She completed chemotherapy 06-16-15 and vaginal brachytherapy30 gray in 5 fractions as of 09/14/2015. She did well thru end of 11-2015, including at gyn oncology exam 11-20-15, then presented to ED 12-29-15 with ~ 2 weeks progressive abdominal distension and pain, with large volume malignant ascites and large recurrent tumor burden. Cytology [id number] from ascites malignant cells consistent with gyn adenocarcinoma. She was hospitalized thru 01-07-16, with paracentesis for 4 liters bloody fluid on 1-28, for 3.5 liters on 01-02-16 and 4 liters on 01-09-16. Chemo was held 01-08-16 due to concerning exam, with bilateral LE venous dopplers no evidence of DVT and CT confirming large ascites (not appreciated on [place] 01-06-16) and bulky tumor without apparent bleed. Patient and family were counseled on very aggressive and advanced recurrent malignancy, understood that any treatment would be in attempt to control disease but not potential cure. Discussion then about life support and advance directives, specifically that such measures would not improve the underlying malignancy; patient and family wanted to consider that information. She requested attempt at treatment,  had first adriamycin on 01-10-16 She was receiving PRBCs  when developed respiratory distress, taken to ED and admitted to ICU.  Objective: Vital signs in last 24 hours: Blood pressure 95/75, pulse 75, temperature 94.6 F (34.8 C), temperature source Core (Comment), resp. rate 35, height 5\' 2"  (1.575 m), weight 173 lb 4.5 oz (78.6 kg), SpO2 100 %. Rouses to voice as noted. Orally intubated. Breath sounds heard anteriorly. Dialysis line functioning. PAC site ok. Heart RRR. Abdomen full and tight, tho not as distended as prior to paracentesis on 2-7. Quiet,  not  obviously tender. SCDs on.   Intake/Output from previous day: 02/09 0701 - 02/10 0700 In: 1177.7 [I.V.:877.7; IV Piggyback:300] Out: 141 [Urine:82] Intake/Output this shift: Total I/O In: -  Out: 160 [Other:160]    Lab Results:  Recent Labs  01/11/16 1652 01/12/16 0322  WBC 20.2* 18.2*  HGB 12.9 11.6*  HCT 37.6 32.8*  PLT 257 216   BMET  Recent Labs  01/12/16 0122 01/12/16 0322  NA 121* 124*  K 6.3* 6.3*  CL 92* 94*  CO2 17* 19*  GLUCOSE 399* 382*  BUN 63* 64*  CREATININE 3.73* 3.64*  CALCIUM 7.8* 7.9*    Studies/Results: Ct Abdomen Pelvis Wo Contrast  01/11/2016  CLINICAL DATA:  Advanced endometrial cancer with peritoneal spread. Recent transfusion for anemia, subsequently developing shortness of breath. EXAM: CT CHEST, ABDOMEN AND PELVIS WITHOUT CONTRAST TECHNIQUE: Multidetector CT imaging of the chest, abdomen and pelvis was performed following the standard protocol without IV contrast. COMPARISON:  CT abdomen pelvis with contrast 01/08/2016. FINDINGS: CT CHEST FINDINGS Patient has been intubated. Endotracheal tube is near carina and should be withdrawn 3-4 cm. Port-A-Cath tip unchanged proximal RIGHT atrium. New LEFT IJ dual-lumen venous catheter distal SVC. No pneumothorax. Mediastinum/Lymph Nodes: No masses or pathologically enlarged lymph nodes identified on this un-enhanced exam. Lungs/Pleura: BILATERAL pleural effusions, larger on the RIGHT. Suspected LEFT greater than RIGHT lower lobe infiltrates. Perihilar opacities suggesting superimposed pulmonary edema. Musculoskeletal: No chest wall mass or suspicious bone lesions identified. CT ABDOMEN PELVIS FINDINGS Hepatobiliary: No mass visualized on this un-enhanced exam. Pancreas: No mass or inflammatory process identified on this un-enhanced exam. Spleen: Within normal limits in size. Adrenals/Urinary Tract: LEFT hydronephrosis redemonstrated. BILATERAL contrast nephrograms persists from the February 6 scan indicating  acute renal failure. Large pelvic mass. Extrinsic mass effect on the bladder with posterior wall invasion and LEFT UVJ mass effect contributes to hydronephrosis. Stomach/Bowel: No evidence of obstruction, inflammatory process, or abnormal fluid collections. Vascular/Lymphatic: No pathologically enlarged lymph nodes. No evidence of abdominal aortic aneurysm. Reproductive: Status post hysterectomy. Widespread omental tumor. Marked worsening of ascites. Other: None. Musculoskeletal:  No suspicious bone lesions identified. IMPRESSION: Marked worsening aeration since priors. Pulmonary edema with superimposed lower lobe infiltrates, LEFT greater than RIGHT. Endotracheal tube too low.  Withdrawal 3-4 cm. Marked worsening ascites. Pelvic tumor redemonstrated with bladder invasion. Persistent contrast nephrograms with LEFT hydronephrosis. Electronically Signed   By: Staci Righter M.D.   On: 01/11/2016 21:56   Ct Chest Wo Contrast  01/11/2016  CLINICAL DATA:  Advanced endometrial cancer with peritoneal spread. Recent transfusion for anemia, subsequently developing shortness of breath. EXAM: CT CHEST, ABDOMEN AND PELVIS WITHOUT CONTRAST TECHNIQUE: Multidetector CT imaging of the chest, abdomen and pelvis was performed following the standard protocol without IV contrast. COMPARISON:  CT abdomen pelvis with contrast 01/08/2016. FINDINGS: CT CHEST FINDINGS Patient has been intubated. Endotracheal tube is near carina and should be withdrawn 3-4 cm. Port-A-Cath tip unchanged proximal RIGHT atrium. New LEFT IJ dual-lumen venous catheter distal SVC. No pneumothorax. Mediastinum/Lymph Nodes: No masses or pathologically enlarged lymph nodes identified on this un-enhanced exam. Lungs/Pleura: BILATERAL pleural effusions, larger on the RIGHT. Suspected LEFT greater than RIGHT lower lobe infiltrates. Perihilar opacities suggesting superimposed pulmonary edema. Musculoskeletal: No chest wall mass or suspicious bone lesions identified. CT  ABDOMEN PELVIS FINDINGS Hepatobiliary: No mass visualized on this un-enhanced exam. Pancreas: No mass or inflammatory process identified on this un-enhanced exam. Spleen: Within normal limits in size. Adrenals/Urinary Tract: LEFT hydronephrosis redemonstrated. BILATERAL contrast  nephrograms persists from the February 6 scan indicating acute renal failure. Large pelvic mass. Extrinsic mass effect on the bladder with posterior wall invasion and LEFT UVJ mass effect contributes to hydronephrosis. Stomach/Bowel: No evidence of obstruction, inflammatory process, or abnormal fluid collections. Vascular/Lymphatic: No pathologically enlarged lymph nodes. No evidence of abdominal aortic aneurysm. Reproductive: Status post hysterectomy. Widespread omental tumor. Marked worsening of ascites. Other: None. Musculoskeletal:  No suspicious bone lesions identified. IMPRESSION: Marked worsening aeration since priors. Pulmonary edema with superimposed lower lobe infiltrates, LEFT greater than RIGHT. Endotracheal tube too low.  Withdrawal 3-4 cm. Marked worsening ascites. Pelvic tumor redemonstrated with bladder invasion. Persistent contrast nephrograms with LEFT hydronephrosis. Electronically Signed   By: Staci Righter M.D.   On: 01/11/2016 21:56   Dg Chest Port 1 View  01/12/2016  CLINICAL DATA:  65 year old female with respiratory failure. Metastatic endometrial cancer. Shortness of breath, intubated. Initial encounter. EXAM: PORTABLE CHEST 1 VIEW COMPARISON:  CT chest abdomen and pelvis 01/11/2016 and earlier. FINDINGS: Portable AP semi upright view at 0454 hours. Endotracheal tube tip now projects in good position just below the clavicles. Right chest porta cath remains in place. Left IJ approach dual lumen dialysis type catheter at the lower SVC level. Normal cardiac size and mediastinal contours. No pneumothorax or pulmonary edema. Confluent bilateral lower lobe consolidation is less apparent than on yesterday CT. Small  right pleural effusion on that exam also is not evident today. No areas of worsening ventilation. IMPRESSION: 1. Endotracheal tube tip in good position at the level the clavicles. 2.  Otherwise, stable lines and tubes. 3. No new cardiopulmonary finding. Bilateral lower lobe consolidation compatible with pneumonia. Small right pleural effusion seen yesterday not evident radiographically. Electronically Signed   By: Genevie Ann M.D.   On: 01/12/2016 06:57   Dg Chest Port 1 View  01/11/2016  CLINICAL DATA:  Status post intubation and central line placement EXAM: PORTABLE CHEST 1 VIEW COMPARISON:  January 11, 2016 FINDINGS: Endotracheal tube is identified with distal tip 1.4 cm from carina. Retraction by 2.5 cm is recommended. Dual lumen left jugular central venous line are identified with distal tip in the superior vena cava. Right central venous line is identified with distal tip in the superior vena cava unchanged. There is pulmonary edema. There is no pleural effusion or focal pneumonia. There is no pneumothorax. The osseous structures are stable. IMPRESSION: Endotracheal tube distal tip 1.5 cm from carina. Retraction by 2.5 cm is recommended. Left central venous line distal tips in the superior vena cava. No pneumothorax. Pulmonary edema. These results will be called to the ordering clinician or representative by the Radiologist Assistant, and communication documented in the PACS or zVision Dashboard. Electronically Signed   By: Abelardo Diesel M.D.   On: 01/11/2016 21:13   Dg Chest Portable 1 View  01/11/2016  CLINICAL DATA:  History of uterine cancer with abdominal ascites. Patient became hypertensive and diaphoretic during blood transfusion. EXAM: PORTABLE CHEST 1 VIEW COMPARISON:  CT of the chest 01/13/2015 FINDINGS: Right internal jugular approach injectable port terminates within the expected location of superior vena cava. Cardiomediastinal silhouette is normal. Mediastinal contours appear intact. There is no  evidence of pneumothorax. Lung volumes are low. There is bilateral interstitial thickening which may be seen with pulmonary vascular congestion. More confluent left lower lobe airspace consolidation cannot be excluded. Osseous structures are without acute abnormality. Soft tissues are grossly normal. IMPRESSION: Low lung volumes with bilateral interstitial thickening, likely representing pulmonary vascular congestion. More  confluent left lower lobe airspace consolidation cannot be excluded. Electronically Signed   By: Fidela Salisbury M.D.   On: 01/11/2016 16:51   DISCUSSION: I have discussed all of situation with this daughter, as patient's other daughter (who is nuclear medicine tech in [place]) accompanied to office visit this week. I have said again that underlying malignancy is very aggressive and has reoccurred extensively over very short time. Daughter knows that patient was aware of this information and that I had discussed life support/ Advance Directives, including information that aggressive support would not improve the underlying malignancy. We agreed that patient certainly wanted to try the chemotherapy in salvage attempt, tho I do not know that it will give any benefit in short term; blood counts likely will drop from chemo in 1-2 weeks. Daughter understands that patient is critically ill, with multiple problems caused by or related to the underlying cancer, and likely will not survive these acute problems.     Assessment/Plan: 1.High grade endometrial carcinoma: rapidly recurrent in past ~ 3 weeks with large volume, rapidly reoccuring malignant ascites and extensive, bulky metastatic disease in abdomen and pelvis. Received first adriamycin on 01-10-16, in attempt to improve/ control the disease, however this is not situation of potential cure. Would not expect tumor lysis with this tumor type or chemo, tho rapid increase in K (with ARF) noted. Counts likely will drop in ~ a week, may need to  add gCSF in next couple of days but will not begin yet with elevated WBC now. 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 2.Acute hypoxic respiratory failure 01-11-16: intubated, critical care managing. Large ascites restricting breathing 3.acute renal failure: on CVVHD begun last pm. Minimal urine output in foley. Left hydroureteronephrosis from pelvic tumor obstruction.  4.anemia with bloody malignant ascites, up since transfusion ~ 1.5 units PRBCs on 01-11-16 5.Diabetes on oral agents PTA 6.Severe protein calorie malnutrition. TNA would not help underlying malignancy. 7. Hyponatremia and electrolyte abnormalities including marked hyperkalemia on admission.  8.extensive left flank bruising but no retroperitoneal bleeding by scan 9.no LE DVTs by doppler 01-09-16 10.flu vaccine done early this week 11.Advanced Directives discussed. Agree that limited interventions more appropriate than full code in this situation. If situation worsens, DNR and comfort care would also be appropriate given underlying malignancy.  WIll follow with you, thank you. Please page if I can be of help between rounds   Brittany Schwartz

## 2016-01-12 NOTE — Progress Notes (Signed)
CRT stopped for placement of TPA to ports.

## 2016-01-12 NOTE — Progress Notes (Signed)
Initial Nutrition Assessment  DOCUMENTATION CODES:   Not applicable  INTERVENTION:  -RD to continue to monitor for needs -If unable to extubate, recommend begin VItal AF 1.2 @ 78mL/hr, increase by 10 every 8 hours to goal rate of 62mL/hr. -TF regimen provides 1920 calories, 120g protein, 1300cc Free H2O  NUTRITION DIAGNOSIS:   Inadequate oral intake related to inability to eat as evidenced by NPO status.  GOAL:   Patient will meet greater than or equal to 90% of their needs  MONITOR:   I & O's, Diet advancement, Vent status, Labs, Weight trends  REASON FOR ASSESSMENT:   Ventilator    ASSESSMENT:   65 year old female with PMH as below, which includes grade 3 serous/ endometrioid endometrial carcinoma IIIA at diagnosis, adjuvant chemo completed 06/16/15. Now with rapid recurrent disease since mid January with large volume malignant ascites, bulky involvement pelvis and abdominal caking.   Patient is currently intubated on ventilator support MV: 10.9 L/min Temp (24hrs), Avg:95.9 F (35.5 C), Min:94.5 F (34.7 C), Max:97.5 F (36.4 C)  Propofol: none  Pt presented to ED hypertensive, SOB, was unable to tolerate BiPAP and had to be intubated. Suffering from AKI on CRRT at this time. No family at bedside during time of visit. Pt has gained significant wt in fluid from previous visit on 2/1. Wt up to 173# from 142#. Pt has scheduled paracentesis outpatient today at 2pm, likely will not take place. OGT suction.  Labs: Na 126, K 5.8, Medications: Versed PRN Drips: Levophed, Fentanyl  Diet Order:  Diet NPO time specified  Skin:  Wound (see comment) (Bilateral ecchymosis to arms.)  Last BM:  2/3  Height:   Ht Readings from Last 1 Encounters:  01/12/16 5\' 2"  (1.575 m)    Weight:   Wt Readings from Last 1 Encounters:  01/12/16 173 lb 4.5 oz (78.6 kg)    Ideal Body Weight:     BMI:  Body mass index is 31.69 kg/(m^2).  Estimated Nutritional Needs:   Kcal:   [id number]  Protein:  95-130g  Fluid:  >/= 2L  EDUCATION NEEDS:   No education needs identified at this time  Satira Anis. Gordy Goar, MS, RD LDN After Hours/Weekend Pager [phone]

## 2016-01-12 NOTE — Progress Notes (Signed)
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  Now with rapid recurrent disease since mid January with large volume malignant ascites, bulky involvement pelvis and abdominal caking.  She is followed by Dr. Marko Plume with oncology and is currently undergoing chemo.  Family reports that oncologist feels as though there is hope for improvement with chemotherapy. She received last dose of chemotherapy 2/8. She had 2u PRBC transfusion 2/9 for symptomatic anemia.  During transfusion, she complained of SOB with diaphoresis.  She was subsequently sent to ED for further evaluation.  In ED, she was hypertensive and remained SOB.  She was therefore placed on BiPAP. CXR showed significant pulmonary edema. Laboratory assessment was significant for renal failure with K 7.2, Na 119 (chronically low), and SCr 3.61. She is also hyperglycemic with Glucose 369. Lab eval also notable for WBC 20.2. CT abdomen from a few days prior consistent with progressive mass size with compression of L ureter and L hydronephrosis. She was unable to tolerate BiPAP in ED and required intubation.  PCCM asked to admit.  Extensive discussion with family by Dr. Titus Mould.  Given Mrs. Klingensmith's hx and current circumstances, decision made to limit code status to intubation and vasopressors only.  In the event of cardiac arrest, we would not put her through CPR or defibrillation.  Full medical management otherwise.  SUBJECTIVE:  hypothemic on CRRT - on warmer Wide awake on low dose fent intubated   VITAL SIGNS: BP 106/69 mmHg  Pulse 75  Temp(Src) 94.6 F (34.8 C) (Core (Comment))   Resp 35  Ht 5\' 2"  (1.575 m)  Wt 173 lb 4.5 oz (78.6 kg)  BMI 31.69 kg/m2  SpO2 100%  HEMODYNAMICS:    VENTILATOR SETTINGS: Vent Mode:  [-] PRVC FiO2 (%):  [40 %-100 %] 40 % Set Rate:  [26 bmp-35 bmp] 35 bmp Vt Set:  [410 mL-430 mL] 410 mL PEEP:  [5 cmH20] 5 cmH20 Plateau Pressure:  [28 cmH20-35 cmH20] 33 cmH20  INTAKE / OUTPUT: I/O last 3 completed shifts: In: 1177.7 [I.V.:877.7; IV Piggyback:300] Out: 141 [Urine:82; Other:59]  PHYSICAL EXAMINATION: General:  Female of normal body habitus in NAD Neuro:  Awake, interactive, can write HEENT:  [place]/At, PERRL , no appreciable JVD Cardiovascular:  Tachy, regular, no MRG Lungs:  Coarse crackles bilaterally.  Abdomen:  Distended, fluctuant, diffusely tender.  Musculoskeletal:  No acute deformity Skin:  Grossly intact  LABS:  BMET  Recent Labs Lab 01/12/16 0122 01/12/16 0322 01/12/16 0756  NA 121* 124* 126*  K 6.3* 6.3* 5.8*  CL 92* 94* 95*  CO2 17* 19* 19*  BUN 63* 64* 56*  CREATININE 3.73* 3.64* 3.13*  GLUCOSE 399* 382* 267*    Electrolytes  Recent Labs Lab 01/12/16 0122 01/12/16 0322 01/12/16 0756  CALCIUM 7.8* 7.9* 8.1*  MG  --  1.9 2.1  PHOS  --  5.9* 4.6    CBC  Recent Labs Lab 01/10/16 1323 01/11/16 1652 01/12/16 0322  WBC 15.2* 20.2* 18.2*  HGB 8.6* 12.9 11.6*  HCT 26.8* 37.6 32.8*  PLT 245 257 216    Coag's No results for input(s): APTT, INR in the last 168 hours.  Sepsis Markers  Recent Labs Lab 01/11/16 2109  LATICACIDVEN 3.0*    ABG  Recent Labs Lab 01/11/16 2201 01/12/16 0020 01/12/16 0353  PHART 7.201* 7.294* 7.400  PCO2ART 34.1* 33.2* 26.5*  PO2ART 391* 220* 152*    Liver Enzymes  Recent Labs Lab 01/08/16 1132 01/11/16 1652 01/12/16 0756  AST 16 25  --   ALT 11 14  --   ALKPHOS 73 93  --   BILITOT 0.71 0.9  --   ALBUMIN 1.7* 2.1* 1.8*    Cardiac Enzymes  Recent Labs Lab 01/11/16 2205  TROPONINI 0.26*    Glucose  Recent Labs Lab  01/07/16 0754 01/07/16 1129 01/11/16 1600 01/11/16 2159 01/12/16 0309 01/12/16 0732  GLUCAP 219* 224* 278* 347* 366* 279*    Imaging Ct Abdomen Pelvis Wo Contrast  01/11/2016  CLINICAL DATA:  Advanced endometrial cancer with peritoneal spread. Recent transfusion for anemia, subsequently developing shortness of breath. EXAM: CT CHEST, ABDOMEN AND PELVIS WITHOUT CONTRAST TECHNIQUE: Multidetector CT imaging of the chest, abdomen and pelvis was performed following the standard protocol without IV contrast. COMPARISON:  CT abdomen pelvis with contrast 01/08/2016. FINDINGS: CT CHEST FINDINGS Patient has been intubated. Endotracheal tube is near carina and should be withdrawn 3-4 cm. Port-A-Cath tip unchanged proximal RIGHT atrium. New LEFT IJ dual-lumen venous catheter distal SVC. No pneumothorax. Mediastinum/Lymph Nodes: No masses or pathologically enlarged lymph nodes identified on this un-enhanced exam. Lungs/Pleura: BILATERAL pleural effusions, larger on the RIGHT. Suspected LEFT greater than RIGHT lower lobe infiltrates. Perihilar opacities suggesting superimposed pulmonary edema. Musculoskeletal: No chest wall mass or suspicious bone lesions identified. CT ABDOMEN PELVIS FINDINGS Hepatobiliary: No mass visualized on this un-enhanced exam. Pancreas: No mass or inflammatory process identified on this un-enhanced exam. Spleen: Within normal limits in size. Adrenals/Urinary Tract: LEFT hydronephrosis redemonstrated. BILATERAL contrast nephrograms persists from the February 6 scan indicating acute renal failure. Large pelvic mass. Extrinsic mass effect on the bladder with posterior wall invasion and LEFT UVJ mass effect contributes to hydronephrosis. Stomach/Bowel: No evidence of obstruction, inflammatory process, or abnormal fluid collections. Vascular/Lymphatic: No pathologically enlarged lymph nodes. No evidence of abdominal aortic aneurysm. Reproductive: Status post hysterectomy. Widespread omental tumor.  Marked worsening of ascites. Other: None. Musculoskeletal:  No suspicious bone lesions identified. IMPRESSION: Marked worsening aeration since priors. Pulmonary edema with superimposed lower lobe infiltrates, LEFT greater than RIGHT. Endotracheal tube too low.  Withdrawal 3-4 cm. Marked worsening ascites. Pelvic tumor redemonstrated with bladder invasion. Persistent contrast nephrograms with LEFT hydronephrosis. Electronically Signed   By: Staci Righter M.D.   On: 01/11/2016 21:56   Ct Chest Wo Contrast  01/11/2016  CLINICAL DATA:  Advanced endometrial cancer with peritoneal spread. Recent transfusion for anemia, subsequently developing shortness of breath. EXAM: CT CHEST, ABDOMEN AND PELVIS WITHOUT CONTRAST TECHNIQUE: Multidetector CT imaging of the chest, abdomen and pelvis was performed following the standard protocol without IV contrast. COMPARISON:  CT abdomen pelvis with contrast 01/08/2016. FINDINGS: CT CHEST FINDINGS Patient has been intubated. Endotracheal tube is near carina and should be withdrawn 3-4 cm. Port-A-Cath tip unchanged proximal RIGHT atrium. New LEFT IJ dual-lumen venous catheter distal SVC. No pneumothorax. Mediastinum/Lymph Nodes: No masses or pathologically enlarged lymph nodes identified on this un-enhanced exam. Lungs/Pleura: BILATERAL pleural effusions, larger on the RIGHT. Suspected LEFT greater than RIGHT lower lobe infiltrates. Perihilar opacities suggesting superimposed pulmonary edema. Musculoskeletal: No chest wall mass or suspicious bone lesions identified. CT ABDOMEN PELVIS FINDINGS Hepatobiliary: No mass visualized on this un-enhanced exam. Pancreas: No  mass or inflammatory process identified on this un-enhanced exam. Spleen: Within normal limits in size. Adrenals/Urinary Tract: LEFT hydronephrosis redemonstrated. BILATERAL contrast nephrograms persists from the February 6 scan indicating acute renal failure. Large pelvic mass. Extrinsic mass effect on the bladder with  posterior wall invasion and LEFT UVJ mass effect contributes to hydronephrosis. Stomach/Bowel: No evidence of obstruction, inflammatory process, or abnormal fluid collections. Vascular/Lymphatic: No pathologically enlarged lymph nodes. No evidence of abdominal aortic aneurysm. Reproductive: Status post hysterectomy. Widespread omental tumor. Marked worsening of ascites. Other: None. Musculoskeletal:  No suspicious bone lesions identified. IMPRESSION: Marked worsening aeration since priors. Pulmonary edema with superimposed lower lobe infiltrates, LEFT greater than RIGHT. Endotracheal tube too low.  Withdrawal 3-4 cm. Marked worsening ascites. Pelvic tumor redemonstrated with bladder invasion. Persistent contrast nephrograms with LEFT hydronephrosis. Electronically Signed   By: Staci Righter M.D.   On: 01/11/2016 21:56   Dg Chest Port 1 View  01/12/2016  CLINICAL DATA:  64 year old female with respiratory failure. Metastatic endometrial cancer. Shortness of breath, intubated. Initial encounter. EXAM: PORTABLE CHEST 1 VIEW COMPARISON:  CT chest abdomen and pelvis 01/11/2016 and earlier. FINDINGS: Portable AP semi upright view at 0454 hours. Endotracheal tube tip now projects in good position just below the clavicles. Right chest porta cath remains in place. Left IJ approach dual lumen dialysis type catheter at the lower SVC level. Normal cardiac size and mediastinal contours. No pneumothorax or pulmonary edema. Confluent bilateral lower lobe consolidation is less apparent than on yesterday CT. Small right pleural effusion on that exam also is not evident today. No areas of worsening ventilation. IMPRESSION: 1. Endotracheal tube tip in good position at the level the clavicles. 2.  Otherwise, stable lines and tubes. 3. No new cardiopulmonary finding. Bilateral lower lobe consolidation compatible with pneumonia. Small right pleural effusion seen yesterday not evident radiographically. Electronically Signed   By: Genevie Ann M.D.   On: 01/12/2016 06:57   Dg Chest Port 1 View  01/11/2016  CLINICAL DATA:  Status post intubation and central line placement EXAM: PORTABLE CHEST 1 VIEW COMPARISON:  January 11, 2016 FINDINGS: Endotracheal tube is identified with distal tip 1.4 cm from carina. Retraction by 2.5 cm is recommended. Dual lumen left jugular central venous line are identified with distal tip in the superior vena cava. Right central venous line is identified with distal tip in the superior vena cava unchanged. There is pulmonary edema. There is no pleural effusion or focal pneumonia. There is no pneumothorax. The osseous structures are stable. IMPRESSION: Endotracheal tube distal tip 1.5 cm from carina. Retraction by 2.5 cm is recommended. Left central venous line distal tips in the superior vena cava. No pneumothorax. Pulmonary edema. These results will be called to the ordering clinician or representative by the Radiologist Assistant, and communication documented in the PACS or zVision Dashboard. Electronically Signed   By: Abelardo Diesel M.D.   On: 01/11/2016 21:13   Dg Chest Portable 1 View  01/11/2016  CLINICAL DATA:  History of uterine cancer with abdominal ascites. Patient became hypertensive and diaphoretic during blood transfusion. EXAM: PORTABLE CHEST 1 VIEW COMPARISON:  CT of the chest 01/13/2015 FINDINGS: Right internal jugular approach injectable port terminates within the expected location of superior vena cava. Cardiomediastinal silhouette is normal. Mediastinal contours appear intact. There is no evidence of pneumothorax. Lung volumes are low. There is bilateral interstitial thickening which may be seen with pulmonary vascular congestion. More confluent left lower lobe airspace consolidation cannot be excluded. Osseous structures are  without acute abnormality. Soft tissues are grossly normal. IMPRESSION: Low lung volumes with bilateral interstitial thickening, likely representing pulmonary vascular congestion.  More confluent left lower lobe airspace consolidation cannot be excluded. Electronically Signed   By: Fidela Salisbury M.D.   On: 01/11/2016 16:51     STUDIES:  CXR 02/09 > low volumes, pulm vascular congestion.  CULTURES: Blood 02/09 > Urine 02/09 >  ANTIBIOTICS: Vanc 02/09 > Zosyn 02/09 >  SIGNIFICANT EVENTS: 02/09 > admitted with acute hypoxic respiratory failure, multiple electrolyte derangements.  LINES/TUBES: ETT 02/09 > R IJ HD cath 02/09 >   ASSESSMENT / PLAN:  PULMONARY A: Acute hypoxic respiratory failure - likely due to PRBC transfusion. Pulmonary edema vs TRALI. P:   SBTs as able once off pressors Albuterol PRN.  HEMATOLOGIC / ONCOLOGIC A:   Grade 3 serous/ endometrioid endometrial carcinoma IIIA - currently undergoing chemo under the care of Dr. Marko Plume - recurrence, aggressive VTE prophylaxis. P:  Appreciate onc input SCD's / Heparin.  CARDIOVASCULAR A:  Shock  - sedation related vs septic shock (doubtful). Hx HTN. elevated trops P:  Levophed as needed to maintain goal MAP > 65.   RENAL A:   Acute renal failure, post-renal in setting of obstructing pelvic mass. Hyperkalemia (temoporised in ED). Hyponatremia (chronic, recent baseline 122). NAG acidosis. Pseudohypocalcemia - corrects to 10.12. L hydronephrosis. P:   Ct HCO3 @ 125. Assess ionized calcium. Replete lytes as needed Called urology - manny , who willa ssess for intervention  GASTROINTESTINAL A:   VTE prophylaxis. Nutrition. P:   SUP: pantoprazole. NPO.  INFECTIOUS A:   R/o septic shock. P:   Abx as above (vanc / zosyn). Follow cultures. PCT not useful given immunocompromised state.  ENDOCRINE A:   DM. P:   SSI. Hold outpatient antihyperglycemic agents.  NEUROLOGIC A:   Acute metabolic encephalopathy. P:   Sedation: fentanyl gtt / midazolam PRN. RASS goal: 0 to -1. Daily WUA. Hold outpatient oxycodone, gabapentin, lorazepam, compazine.   Family  Updates: Daughter & nephew Loleta Dicker Warehouse manager) updated   Given Mrs. Behan's hx and current circumstances, decision made to limit code status to intubation and vasopressors only.  In the event of cardiac arrest, we would not put her through CPR or defibrillation.  Ct CRRT  Summary - Family understands guarded prognosis but are hopeful that we can get her off vent with HD, Prognosis of AKI unclear at this time, May need additional paracentesis priro to extuabtion  Inter-disciplinary family meet or Palliative Care meeting due by:  01/17/16.  The patient is critically ill with multiple organ systems failure and requires high complexity decision making for assessment and support, frequent evaluation and titration of therapies, application of advanced monitoring technologies and extensive interpretation of multiple databases. Critical Care Time devoted to patient care services described in this note independent of APP time is 35 minutes.    Kara Mead MD. Shade Flood. [facility] Pager [phone] If no response call [phone]    01/12/2016, 9:32 AM

## 2016-01-12 NOTE — Consult Note (Signed)
Reason for Consult: Left Malignant Hydronephrosis, Metastatic Serous Cystadenocarcinoma, Acute Renal Failure  Referring Physician: Merrie Roof MD    Brittany Schwartz is an 65 y.o. female.    HPI:   1 - Left Malignant Hydronephrosis - New mild left hydro by CT this admission on eval GYN malignancy. Dilation to level of deep pelvsi and irr   2 - Metastatic Serous Cystadenocarcinoma  - s/p primary surgery, chemo for advanced GYN malignancy, now with diffuse carcinomatosis, infiltrative pelvic mass by imaging this admission. All services including oncology agree guarded prognosis   3 -  Acute Renal Failure - Cr high 3s with hyperkalemia 01/10/16, now on CRRT. Had contrast CT 2/6, repeat non-con CT 2/9 with persistant nephrogram. Baseline Cr >1 1 mo ago and approx 1.5 on admit. Mild mod-left hydro with delayed contrast excretion on imaging 01/10/15.   Today Brittany Schwartz in seen consultation for above. Her daughter and husband are making healthcare decsions.   Past Medical History  Diagnosis Date  . Hypertension   . Glaucoma   . Diabetes mellitus without complication ([facility])   . Palpitations   . Muscle cramps   . Postmenopausal vaginal bleeding   . Uterine cancer [facility])     Past Surgical History  Procedure Laterality Date  . Robotic assisted total hysterectomy with bilateral salpingo oopherectomy Bilateral 01/19/2015    Procedure: XI ROBOTIC ASSISTED TOTAL HYSTERECTOMY WITH BILATERAL SALPINGO OOPHORECTOMY;  Surgeon: [name], MD;  Location: [facility]S;  Service: Gynecology;  Laterality: Bilateral;    Family History  Problem Relation Age of Onset  . Diabetes Father   . Arrhythmia Mother     Social History:  reports that she has never smoked. She has never used smokeless tobacco. She reports that she does not drink alcohol or use illicit drugs.  Allergies: No Known Allergies  Medications: I have reviewed the patient's current medications.  Results for orders placed or performed  during the hospital encounter of 01/11/16 (from the past 48 hour(s))  CBC with Differential/Platelet     Status: Abnormal   Collection Time: 01/11/16  4:52 PM  Result Value Ref Range   WBC 20.2 (H) 4.0 - 10.5 K/uL   RBC 4.29 3.87 - 5.11 MIL/uL   Hemoglobin 12.9 12.0 - 15.0 g/dL   HCT 37.6 36.0 - 46.0 %   MCV 87.6 78.0 - 100.0 fL   MCH 30.1 26.0 - 34.0 pg   MCHC 34.3 30.0 - 36.0 g/dL   RDW 13.3 11.5 - 15.5 %   Platelets 257 150 - 400 K/uL   Neutrophils Relative % 91 %   Neutro Abs 18.4 (H) 1.7 - 7.7 K/uL   Lymphocytes Relative 3 %   Lymphs Abs 0.5 (L) 0.7 - 4.0 K/uL   Monocytes Relative 6 %   Monocytes Absolute 1.3 (H) 0.1 - 1.0 K/uL   Eosinophils Relative 0 %   Eosinophils Absolute 0.0 0.0 - 0.7 K/uL   Basophils Relative 0 %   Basophils Absolute 0.0 0.0 - 0.1 K/uL  Comprehensive metabolic panel     Status: Abnormal   Collection Time: 01/11/16  4:52 PM  Result Value Ref Range   Sodium 119 (LL) 135 - 145 mmol/L    Comment: CRITICAL RESULT CALLED TO, READ BACK BY AND VERIFIED WITH: M.HAMBY RN AT 1740 ON 01/11/16 BY S.VANHOORNE    Potassium 7.2 (HH) 3.5 - 5.1 mmol/L    Comment: NO VISIBLE HEMOLYSIS CRITICAL RESULT CALLED TO, READ BACK BY AND VERIFIED WITH: M.HAMBY RN  AT 1740 ON 01/11/16 BY S.VANHOORNE    Chloride 93 (L) 101 - 111 mmol/L   CO2 13 (L) 22 - 32 mmol/L   Glucose, Bld 369 (H) 65 - 99 mg/dL   BUN 61 (H) 6 - 20 mg/dL   Creatinine, Ser 3.61 (H) 0.44 - 1.00 mg/dL   Calcium 8.6 (L) 8.9 - 10.3 mg/dL   Total Protein 5.7 (L) 6.5 - 8.1 g/dL   Albumin 2.1 (L) 3.5 - 5.0 g/dL   AST 25 15 - 41 U/L   ALT 14 14 - 54 U/L   Alkaline Phosphatase 93 38 - 126 U/L   Total Bilirubin 0.9 0.3 - 1.2 mg/dL   GFR calc non Af Amer 12 (L) >60 mL/min   GFR calc Af Amer 14 (L) >60 mL/min    Comment: (NOTE) The eGFR has been calculated using the CKD EPI equation. This calculation has not been validated in all clinical situations. eGFR's persistently <60 mL/min signify possible Chronic  Kidney Disease.    Anion gap 13 5 - 15  Brain natriuretic peptide     Status: Abnormal   Collection Time: 01/11/16  4:53 PM  Result Value Ref Range   B Natriuretic Peptide 341.7 (H) 0.0 - 100.0 pg/mL  Lactic acid, plasma     Status: Abnormal   Collection Time: 01/11/16  9:09 PM  Result Value Ref Range   Lactic Acid, Venous 3.0 (HH) 0.5 - 2.0 mmol/L    Comment: CRITICAL RESULT CALLED TO, READ BACK BY AND VERIFIED WITH: GIBSON,K RN [id number] COVINGTON,N   Basic metabolic panel     Status: Abnormal   Collection Time: 01/11/16  9:09 PM  Result Value Ref Range   Sodium 120 (L) 135 - 145 mmol/L   Potassium 6.7 (HH) 3.5 - 5.1 mmol/L    Comment: CRITICAL RESULT CALLED TO, READ BACK BY AND VERIFIED WITH: GIBSON,K RN [id number] COVINGTON,N    Chloride 95 (L) 101 - 111 mmol/L   CO2 14 (L) 22 - 32 mmol/L   Glucose, Bld 377 (H) 65 - 99 mg/dL   BUN 61 (H) 6 - 20 mg/dL   Creatinine, Ser 3.56 (H) 0.44 - 1.00 mg/dL   Calcium 8.0 (L) 8.9 - 10.3 mg/dL   GFR calc non Af Amer 12 (L) >60 mL/min   GFR calc Af Amer 14 (L) >60 mL/min    Comment: (NOTE) The eGFR has been calculated using the CKD EPI equation. This calculation has not been validated in all clinical situations. eGFR's persistently <60 mL/min signify possible Chronic Kidney Disease.    Anion gap 11 5 - 15  CBG monitoring, ED     Status: Abnormal   Collection Time: 01/11/16  9:59 PM  Result Value Ref Range   Glucose-Capillary 347 (H) 65 - 99 mg/dL   Comment 1 Notify RN    Comment 2 Document in Chart   Blood gas, arterial     Status: Abnormal   Collection Time: 01/11/16 10:01 PM  Result Value Ref Range   FIO2 1.00    Delivery systems VENTILATOR    Mode PRESSURE REGULATED VOLUME CONTROL    VT 430 mL   LHR 26 resp/min   Peep/cpap 5.0 cm H20   pH, Arterial 7.201 (L) 7.350 - 7.450   pCO2 arterial 34.1 (L) 35.0 - 45.0 mmHg   pO2, Arterial 391 (H) 80.0 - 100.0 mmHg   Bicarbonate 12.8 (L) 20.0 - 24.0 mEq/L   TCO2 12.3 0 - 100  mmol/L   Acid-base deficit 14.0 (H) 0.0 - 2.0 mmol/L   O2 Saturation 99.4 %   Patient temperature 98.6    Collection site RIGHT RADIAL    Drawn by [id number]    Sample type ARTERIAL DRAW    Allens test (pass/fail) PASS PASS  Basic metabolic panel     Status: Abnormal   Collection Time: 01/11/16 10:05 PM  Result Value Ref Range   Sodium 122 (L) 135 - 145 mmol/L   Potassium 6.8 (HH) 3.5 - 5.1 mmol/L    Comment: CRITICAL RESULT CALLED TO, READ BACK BY AND VERIFIED WITH: ADKINS,L RN [id number] COVINGTON,N    Chloride 97 (L) 101 - 111 mmol/L   CO2 14 (L) 22 - 32 mmol/L   Glucose, Bld 373 (H) 65 - 99 mg/dL   BUN 64 (H) 6 - 20 mg/dL   Creatinine, Ser 3.76 (H) 0.44 - 1.00 mg/dL   Calcium 8.0 (L) 8.9 - 10.3 mg/dL   GFR calc non Af Amer 12 (L) >60 mL/min   GFR calc Af Amer 14 (L) >60 mL/min    Comment: (NOTE) The eGFR has been calculated using the CKD EPI equation. This calculation has not been validated in all clinical situations. eGFR's persistently <60 mL/min signify possible Chronic Kidney Disease.    Anion gap 11 5 - 15  Troponin I     Status: Abnormal   Collection Time: 01/11/16 10:05 PM  Result Value Ref Range   Troponin I 0.26 (H) <0.031 ng/mL    Comment:        PERSISTENTLY INCREASED TROPONIN VALUES IN THE RANGE OF 0.04-0.49 ng/mL CAN BE SEEN IN:       -UNSTABLE ANGINA       -CONGESTIVE HEART FAILURE       -MYOCARDITIS       -CHEST TRAUMA       -ARRYHTHMIAS       -LATE PRESENTING MYOCARDIAL INFARCTION       -COPD   CLINICAL FOLLOW-UP RECOMMENDED.   MRSA PCR Screening     Status: None   Collection Time: 01/12/16 12:01 AM  Result Value Ref Range   MRSA by PCR NEGATIVE NEGATIVE    Comment:        The GeneXpert MRSA Assay (FDA approved for NASAL specimens only), is one component of a comprehensive MRSA colonization surveillance program. It is not intended to diagnose MRSA infection nor to guide or monitor treatment for MRSA infections.   Blood gas, arterial      Status: Abnormal   Collection Time: 01/12/16 12:20 AM  Result Value Ref Range   FIO2 0.70    Delivery systems VENTILATOR    Mode PRESSURE REGULATED VOLUME CONTROL    VT 410 mL   LHR 35 resp/min   Peep/cpap 5.0 cm H20   pH, Arterial 7.294 (L) 7.350 - 7.450   pCO2 arterial 33.2 (L) 35.0 - 45.0 mmHg   pO2, Arterial 220 (H) 80.0 - 100.0 mmHg   Bicarbonate 15.6 (L) 20.0 - 24.0 mEq/L   TCO2 14.6 0 - 100 mmol/L   Acid-base deficit 9.5 (H) 0.0 - 2.0 mmol/L   O2 Saturation 99.2 %   Patient temperature 98.6    Collection site LEFT BRACHIAL    Drawn by [id number]    Sample type ARTERIAL DRAW   Urinalysis, Routine w reflex microscopic (not at [facility])     Status: Abnormal   Collection Time: 01/12/16 12:57 AM  Result Value Ref Range   Color,  Urine AMBER (A) YELLOW    Comment: BIOCHEMICALS MAY BE AFFECTED BY COLOR   APPearance TURBID (A) CLEAR   Specific Gravity, Urine 1.017 1.005 - 1.030   pH 5.0 5.0 - 8.0   Glucose, UA 250 (A) NEGATIVE mg/dL   Hgb urine dipstick LARGE (A) NEGATIVE   Bilirubin Urine NEGATIVE NEGATIVE   Ketones, ur NEGATIVE NEGATIVE mg/dL   Protein, ur 30 (A) NEGATIVE mg/dL   Nitrite NEGATIVE NEGATIVE   Leukocytes, UA LARGE (A) NEGATIVE  Urine microscopic-add on     Status: Abnormal   Collection Time: 01/12/16 12:57 AM  Result Value Ref Range   Squamous Epithelial / LPF 0-5 (A) NONE SEEN   WBC, UA TOO NUMEROUS TO COUNT 0 - 5 WBC/hpf   RBC / HPF 6-30 0 - 5 RBC/hpf   Bacteria, UA MANY (A) NONE SEEN   Casts GRANULAR CAST (A) NEGATIVE  Basic metabolic panel     Status: Abnormal   Collection Time: 01/12/16  1:22 AM  Result Value Ref Range   Sodium 121 (L) 135 - 145 mmol/L   Potassium 6.3 (HH) 3.5 - 5.1 mmol/L    Comment: DELTA CHECK NOTED REPEATED TO VERIFY CRITICAL RESULT CALLED TO, READ BACK BY AND VERIFIED WITH: ZOE SUGGS, RN, 0245 01/12/16 NORMAN,A    Chloride 92 (L) 101 - 111 mmol/L   CO2 17 (L) 22 - 32 mmol/L   Glucose, Bld 399 (H) 65 - 99 mg/dL   BUN 63 (H) 6 - 20  mg/dL   Creatinine, Ser 3.73 (H) 0.44 - 1.00 mg/dL   Calcium 7.8 (L) 8.9 - 10.3 mg/dL   GFR calc non Af Amer 12 (L) >60 mL/min   GFR calc Af Amer 14 (L) >60 mL/min    Comment: (NOTE) The eGFR has been calculated using the CKD EPI equation. This calculation has not been validated in all clinical situations. eGFR's persistently <60 mL/min signify possible Chronic Kidney Disease.    Anion gap 12 5 - 15  Glucose, capillary     Status: Abnormal   Collection Time: 01/12/16  3:09 AM  Result Value Ref Range   Glucose-Capillary 366 (H) 65 - 99 mg/dL   Comment 1 Notify RN    Comment 2 Document in Chart   CBC     Status: Abnormal   Collection Time: 01/12/16  3:22 AM  Result Value Ref Range   WBC 18.2 (H) 4.0 - 10.5 K/uL   RBC 3.76 (L) 3.87 - 5.11 MIL/uL   Hemoglobin 11.6 (L) 12.0 - 15.0 g/dL   HCT 32.8 (L) 36.0 - 46.0 %   MCV 87.2 78.0 - 100.0 fL   MCH 30.9 26.0 - 34.0 pg   MCHC 35.4 30.0 - 36.0 g/dL   RDW 13.2 11.5 - 15.5 %   Platelets 216 150 - 400 K/uL  Magnesium     Status: None   Collection Time: 01/12/16  3:22 AM  Result Value Ref Range   Magnesium 1.9 1.7 - 2.4 mg/dL  Phosphorus     Status: Abnormal   Collection Time: 01/12/16  3:22 AM  Result Value Ref Range   Phosphorus 5.9 (H) 2.5 - 4.6 mg/dL  Basic metabolic panel     Status: Abnormal   Collection Time: 01/12/16  3:22 AM  Result Value Ref Range   Sodium 124 (L) 135 - 145 mmol/L   Potassium 6.3 (HH) 3.5 - 5.1 mmol/L    Comment: CRITICAL RESULT CALLED TO, READ BACK BY AND VERIFIED WITH:  Irene Pap, RN, [phone], 01/12/16 NORMAN,A CORRECT TIME OF CALL 0435    Chloride 94 (L) 101 - 111 mmol/L   CO2 19 (L) 22 - 32 mmol/L   Glucose, Bld 382 (H) 65 - 99 mg/dL   BUN 64 (H) 6 - 20 mg/dL   Creatinine, Ser 3.64 (H) 0.44 - 1.00 mg/dL   Calcium 7.9 (L) 8.9 - 10.3 mg/dL   GFR calc non Af Amer 12 (L) >60 mL/min   GFR calc Af Amer 14 (L) >60 mL/min    Comment: (NOTE) The eGFR has been calculated using the CKD EPI equation. This  calculation has not been validated in all clinical situations. eGFR's persistently <60 mL/min signify possible Chronic Kidney Disease.    Anion gap 11 5 - 15  Blood gas, arterial     Status: Abnormal   Collection Time: 01/12/16  3:53 AM  Result Value Ref Range   FIO2 0.50    Delivery systems VENTILATOR    Mode PRESSURE REGULATED VOLUME CONTROL    VT 410 mL   LHR 35 resp/min   Peep/cpap 5.0 cm H20   pH, Arterial 7.400 7.350 - 7.450   pCO2 arterial 26.5 (L) 35.0 - 45.0 mmHg   pO2, Arterial 152 (H) 80.0 - 100.0 mmHg   Bicarbonate 16.6 (L) 20.0 - 24.0 mEq/L   TCO2 15.2 0 - 100 mmol/L   Acid-base deficit 7.2 (H) 0.0 - 2.0 mmol/L   O2 Saturation 98.9 %   Patient temperature 34.6    Collection site RIGHT RADIAL    Drawn by [id number]    Sample type ARTERIAL DRAW    Allens test (pass/fail) PASS PASS  Glucose, capillary     Status: Abnormal   Collection Time: 01/12/16  7:32 AM  Result Value Ref Range   Glucose-Capillary 279 (H) 65 - 99 mg/dL  Renal function panel (daily at 0500)     Status: Abnormal   Collection Time: 01/12/16  7:56 AM  Result Value Ref Range   Sodium 126 (L) 135 - 145 mmol/L   Potassium 5.8 (H) 3.5 - 5.1 mmol/L    Comment: NO VISIBLE HEMOLYSIS   Chloride 95 (L) 101 - 111 mmol/L   CO2 19 (L) 22 - 32 mmol/L   Glucose, Bld 267 (H) 65 - 99 mg/dL   BUN 56 (H) 6 - 20 mg/dL   Creatinine, Ser 3.13 (H) 0.44 - 1.00 mg/dL   Calcium 8.1 (L) 8.9 - 10.3 mg/dL   Phosphorus 4.6 2.5 - 4.6 mg/dL   Albumin 1.8 (L) 3.5 - 5.0 g/dL   GFR calc non Af Amer 15 (L) >60 mL/min   GFR calc Af Amer 17 (L) >60 mL/min    Comment: (NOTE) The eGFR has been calculated using the CKD EPI equation. This calculation has not been validated in all clinical situations. eGFR's persistently <60 mL/min signify possible Chronic Kidney Disease.    Anion gap 12 5 - 15  Magnesium     Status: None   Collection Time: 01/12/16  7:56 AM  Result Value Ref Range   Magnesium 2.1 1.7 - 2.4 mg/dL  Glucose,  capillary     Status: Abnormal   Collection Time: 01/12/16 11:34 AM  Result Value Ref Range   Glucose-Capillary 181 (H) 65 - 99 mg/dL  Glucose, capillary     Status: Abnormal   Collection Time: 01/12/16  3:59 PM  Result Value Ref Range   Glucose-Capillary 129 (H) 65 - 99 mg/dL  Renal function panel (daily  at 1600)     Status: Abnormal   Collection Time: 01/12/16  4:43 PM  Result Value Ref Range   Sodium 131 (L) 135 - 145 mmol/L   Potassium 5.9 (H) 3.5 - 5.1 mmol/L    Comment: NO VISIBLE HEMOLYSIS   Chloride 98 (L) 101 - 111 mmol/L   CO2 21 (L) 22 - 32 mmol/L   Glucose, Bld 142 (H) 65 - 99 mg/dL   BUN 52 (H) 6 - 20 mg/dL   Creatinine, Ser 2.89 (H) 0.44 - 1.00 mg/dL   Calcium 8.0 (L) 8.9 - 10.3 mg/dL   Phosphorus 5.2 (H) 2.5 - 4.6 mg/dL   Albumin 1.8 (L) 3.5 - 5.0 g/dL   GFR calc non Af Amer 16 (L) >60 mL/min   GFR calc Af Amer 19 (L) >60 mL/min    Comment: (NOTE) The eGFR has been calculated using the CKD EPI equation. This calculation has not been validated in all clinical situations. eGFR's persistently <60 mL/min signify possible Chronic Kidney Disease.    Anion gap 12 5 - 15    Ct Abdomen Pelvis Wo Contrast  01/11/2016  CLINICAL DATA:  Advanced endometrial cancer with peritoneal spread. Recent transfusion for anemia, subsequently developing shortness of breath. EXAM: CT CHEST, ABDOMEN AND PELVIS WITHOUT CONTRAST TECHNIQUE: Multidetector CT imaging of the chest, abdomen and pelvis was performed following the standard protocol without IV contrast. COMPARISON:  CT abdomen pelvis with contrast 01/08/2016. FINDINGS: CT CHEST FINDINGS Patient has been intubated. Endotracheal tube is near carina and should be withdrawn 3-4 cm. Port-A-Cath tip unchanged proximal RIGHT atrium. New LEFT IJ dual-lumen venous catheter distal SVC. No pneumothorax. Mediastinum/Lymph Nodes: No masses or pathologically enlarged lymph nodes identified on this un-enhanced exam. Lungs/Pleura: BILATERAL pleural  effusions, larger on the RIGHT. Suspected LEFT greater than RIGHT lower lobe infiltrates. Perihilar opacities suggesting superimposed pulmonary edema. Musculoskeletal: No chest wall mass or suspicious bone lesions identified. CT ABDOMEN PELVIS FINDINGS Hepatobiliary: No mass visualized on this un-enhanced exam. Pancreas: No mass or inflammatory process identified on this un-enhanced exam. Spleen: Within normal limits in size. Adrenals/Urinary Tract: LEFT hydronephrosis redemonstrated. BILATERAL contrast nephrograms persists from the February 6 scan indicating acute renal failure. Large pelvic mass. Extrinsic mass effect on the bladder with posterior wall invasion and LEFT UVJ mass effect contributes to hydronephrosis. Stomach/Bowel: No evidence of obstruction, inflammatory process, or abnormal fluid collections. Vascular/Lymphatic: No pathologically enlarged lymph nodes. No evidence of abdominal aortic aneurysm. Reproductive: Status post hysterectomy. Widespread omental tumor. Marked worsening of ascites. Other: None. Musculoskeletal:  No suspicious bone lesions identified. IMPRESSION: Marked worsening aeration since priors. Pulmonary edema with superimposed lower lobe infiltrates, LEFT greater than RIGHT. Endotracheal tube too low.  Withdrawal 3-4 cm. Marked worsening ascites. Pelvic tumor redemonstrated with bladder invasion. Persistent contrast nephrograms with LEFT hydronephrosis. Electronically Signed   By: Staci Righter M.D.   On: 01/11/2016 21:56   Dg Abd 1 View  01/12/2016  CLINICAL DATA:  65 year old female - NG tube advancement. EXAM: ABDOMEN - 1 VIEW COMPARISON:  01/12/2016 FINDINGS: An NG tube is identified with tip overlying the distal stomach. No other significant abnormalities noted. IMPRESSION: NG tube with tip overlying the distal stomach. Electronically Signed   By: Margarette Canada M.D.   On: 01/12/2016 18:32   Ct Chest Wo Contrast  01/11/2016  CLINICAL DATA:  Advanced endometrial cancer with  peritoneal spread. Recent transfusion for anemia, subsequently developing shortness of breath. EXAM: CT CHEST, ABDOMEN AND PELVIS WITHOUT CONTRAST TECHNIQUE: Multidetector CT  imaging of the chest, abdomen and pelvis was performed following the standard protocol without IV contrast. COMPARISON:  CT abdomen pelvis with contrast 01/08/2016. FINDINGS: CT CHEST FINDINGS Patient has been intubated. Endotracheal tube is near carina and should be withdrawn 3-4 cm. Port-A-Cath tip unchanged proximal RIGHT atrium. New LEFT IJ dual-lumen venous catheter distal SVC. No pneumothorax. Mediastinum/Lymph Nodes: No masses or pathologically enlarged lymph nodes identified on this un-enhanced exam. Lungs/Pleura: BILATERAL pleural effusions, larger on the RIGHT. Suspected LEFT greater than RIGHT lower lobe infiltrates. Perihilar opacities suggesting superimposed pulmonary edema. Musculoskeletal: No chest wall mass or suspicious bone lesions identified. CT ABDOMEN PELVIS FINDINGS Hepatobiliary: No mass visualized on this un-enhanced exam. Pancreas: No mass or inflammatory process identified on this un-enhanced exam. Spleen: Within normal limits in size. Adrenals/Urinary Tract: LEFT hydronephrosis redemonstrated. BILATERAL contrast nephrograms persists from the February 6 scan indicating acute renal failure. Large pelvic mass. Extrinsic mass effect on the bladder with posterior wall invasion and LEFT UVJ mass effect contributes to hydronephrosis. Stomach/Bowel: No evidence of obstruction, inflammatory process, or abnormal fluid collections. Vascular/Lymphatic: No pathologically enlarged lymph nodes. No evidence of abdominal aortic aneurysm. Reproductive: Status post hysterectomy. Widespread omental tumor. Marked worsening of ascites. Other: None. Musculoskeletal:  No suspicious bone lesions identified. IMPRESSION: Marked worsening aeration since priors. Pulmonary edema with superimposed lower lobe infiltrates, LEFT greater than RIGHT.  Endotracheal tube too low.  Withdrawal 3-4 cm. Marked worsening ascites. Pelvic tumor redemonstrated with bladder invasion. Persistent contrast nephrograms with LEFT hydronephrosis. Electronically Signed   By: Staci Righter M.D.   On: 01/11/2016 21:56   Dg Chest Port 1 View  01/12/2016  CLINICAL DATA:  65 year old female with respiratory failure. Metastatic endometrial cancer. Shortness of breath, intubated. Initial encounter. EXAM: PORTABLE CHEST 1 VIEW COMPARISON:  CT chest abdomen and pelvis 01/11/2016 and earlier. FINDINGS: Portable AP semi upright view at 0454 hours. Endotracheal tube tip now projects in good position just below the clavicles. Right chest porta cath remains in place. Left IJ approach dual lumen dialysis type catheter at the lower SVC level. Normal cardiac size and mediastinal contours. No pneumothorax or pulmonary edema. Confluent bilateral lower lobe consolidation is less apparent than on yesterday CT. Small right pleural effusion on that exam also is not evident today. No areas of worsening ventilation. IMPRESSION: 1. Endotracheal tube tip in good position at the level the clavicles. 2.  Otherwise, stable lines and tubes. 3. No new cardiopulmonary finding. Bilateral lower lobe consolidation compatible with pneumonia. Small right pleural effusion seen yesterday not evident radiographically. Electronically Signed   By: Genevie Ann M.D.   On: 01/12/2016 06:57   Dg Chest Port 1 View  01/11/2016  CLINICAL DATA:  Status post intubation and central line placement EXAM: PORTABLE CHEST 1 VIEW COMPARISON:  January 11, 2016 FINDINGS: Endotracheal tube is identified with distal tip 1.4 cm from carina. Retraction by 2.5 cm is recommended. Dual lumen left jugular central venous line are identified with distal tip in the superior vena cava. Right central venous line is identified with distal tip in the superior vena cava unchanged. There is pulmonary edema. There is no pleural effusion or focal pneumonia.  There is no pneumothorax. The osseous structures are stable. IMPRESSION: Endotracheal tube distal tip 1.5 cm from carina. Retraction by 2.5 cm is recommended. Left central venous line distal tips in the superior vena cava. No pneumothorax. Pulmonary edema. These results will be called to the ordering clinician or representative by the Radiologist Assistant, and communication documented in  the PACS or zVision Dashboard. Electronically Signed   By: Abelardo Diesel M.D.   On: 01/11/2016 21:13   Dg Chest Portable 1 View  01/11/2016  CLINICAL DATA:  History of uterine cancer with abdominal ascites. Patient became hypertensive and diaphoretic during blood transfusion. EXAM: PORTABLE CHEST 1 VIEW COMPARISON:  CT of the chest 01/13/2015 FINDINGS: Right internal jugular approach injectable port terminates within the expected location of superior vena cava. Cardiomediastinal silhouette is normal. Mediastinal contours appear intact. There is no evidence of pneumothorax. Lung volumes are low. There is bilateral interstitial thickening which may be seen with pulmonary vascular congestion. More confluent left lower lobe airspace consolidation cannot be excluded. Osseous structures are without acute abnormality. Soft tissues are grossly normal. IMPRESSION: Low lung volumes with bilateral interstitial thickening, likely representing pulmonary vascular congestion. More confluent left lower lobe airspace consolidation cannot be excluded. Electronically Signed   By: Fidela Salisbury M.D.   On: 01/11/2016 16:51   Dg Abd Portable 1v  01/12/2016  CLINICAL DATA:  NG tube placement. EXAM: PORTABLE ABDOMEN - 1 VIEW COMPARISON:  CT of the abdomen pelvis 01/10/2014 FINDINGS: Enteric catheter tip is seen overlying the mid upper abdomen, location is uncertain as no gastric bubble is seen, however it overlies the expected location of the proximal stomach. There is nonobstructive bowel gas pattern. Hyperdense renal shadows are seen.  Bridging osteophytes of the lumbosacral spine are noted. IMPRESSION: Enteric catheter overlies expected location of the proximal stomach. Its location is uncertain as no gastric bubble is visualized. Nonobstructive bowel gas pattern. Hyperdense appearance of the renal shadows may represent retention of contrast from CT performed 15 hours ago, indicative of renal function impairment. Electronically Signed   By: Fidela Salisbury M.D.   On: 01/12/2016 11:48    Review of Systems  Unable to perform ROS  Blood pressure 95/60, pulse 86, temperature 96.8 F (36 C), temperature source Core (Comment), resp. rate 25, height _0  (1.575 m), weight 78.6 kg (173 lb 4.5 oz), SpO2 100 %. Physical Exam  Constitutional:  Ill appearing, stigmata of chronic disease  HENT:  Temporal wasting. ETT in place on vent.   Neck: Normal range of motion.  Cardiovascular: Normal rate.   GI: Soft.  Genitourinary:  No frank CVAT, but on sedation  Neurological:  GCS 3T    Assessment/Plan:  1 - Left Malignant Hydronephrosis - discussed aggressive path with cysto, bilat JJ stents with goal of renal decompression to help favor GFR recovery and perhaps allow her to have further chemo v more palliative-approach with non-treatmetn as this is progression of her irreversable disease.   At this point family still leanign towards aggressive therapy, though they understand that chances of meanignful overall function recovery is not good.   Will rediscuss with family tomorrow and consider cysto, stents should the want to proceed.   2 - Metastatic Serous Cystadenocarcinoma  - very gaurded prognosis as rapid progression by imaging and rapid functional decline. This was frankly discussed with family.   3 -  Acute Renal Failure - likely multifactorial with contrast nephropathy + left obstruction + poor perfusion state.   Will follow, please call me directly with questions anytime.   Brittany Schwartz 01/12/2016, 6:42 PM

## 2016-01-12 NOTE — Progress Notes (Signed)
Pt transported from ED to ICU room 1229.  Pt suctioned prior to transport.  Pt stable and comfortable throughout transport.

## 2016-01-12 NOTE — Progress Notes (Signed)
Patient core temp is 34.5, MD called, warmed blankets and Bair Hugger applied.

## 2016-01-12 NOTE — Consult Note (Signed)
Reason for Consult:  ARF, hyperkalemia Referring Physician: Ashok Cordia, MD  Brittany Schwartz is an 65 y.o. Schwartz.  HPI: Brittany Schwartz is a 65 yo Brittany Schwartz with PMH sig for HTN, DM, and recurrent endometrioid endometrial carcinoma (s/p 6 months of adjuvant chemotherapy, last dose on 01/10/16) who was recently admitted on 12/29/15 with worsening malignant ascites s/p large volume paracentesis x last performed on 01/09/16 who presented to [facility] with acute onset SOB and diaphoresis following blood transfusion of 2 units PRBC's on 01/11/16.  She was placed on BiPAP and CXR revealed pulmonary edema.  She was also found to have ARF with Scr of 3.61 and a potassium level of 7.2.  She was unable to tolerate BiPap and was subsequently intubated and transferred to the ICU.  She became hypotensive as well and has been placed on pressors.  She was started on a bicarb drip due to metabolic acidosis and hyperkalemia, however she has not produced much urine and we were consulted to help further evaluate and manage her ARF and electrolyte abnormalities.  CT scan revealed progressive size of her peritoneal mass with compression of left ureter and left sided hydronephrosis, as well as retained contrast from prior nephrogram and worsening carcinomatosis.  The trend in Scr is seen below.   Trend in Creatinine: CREATININE, SER  Date/Time Value Ref Range Status  01/12/2016 01:22 AM 3.73* 0.44 - 1.00 mg/dL Final  01/11/2016 10:05 PM 3.76* 0.44 - 1.00 mg/dL Final  01/11/2016 09:09 PM 3.56* 0.44 - 1.00 mg/dL Final  01/11/2016 04:52 PM 3.61* 0.44 - 1.00 mg/dL Final  01/08/2016 11:32 AM 1.6* 0.6 - 1.1 mg/dL Final  01/07/2016 05:30 AM 1.48* 0.44 - 1.00 mg/dL Final  01/06/2016 09:00 AM 1.43* 0.44 - 1.00 mg/dL Final  01/06/2016 05:40 AM 1.41* 0.44 - 1.00 mg/dL Final  01/05/2016 02:20 AM 1.23* 0.44 - 1.00 mg/dL Final  01/04/2016 09:08 PM 1.40* 0.44 - 1.00 mg/dL Final  01/03/2016 04:40 AM 1.33* 0.44 - 1.00 mg/dL Final  01/02/2016 04:13 AM 1.36*  0.44 - 1.00 mg/dL Final  01/01/2016 04:30 AM 1.30* 0.44 - 1.00 mg/dL Final  12/31/2015 04:05 AM 1.33* 0.44 - 1.00 mg/dL Final  12/30/2015 04:30 AM 1.34* 0.44 - 1.00 mg/dL Final  12/29/2015 12:42 PM 1.35* 0.44 - 1.00 mg/dL Final  11/20/2015 12:56 PM 0.9 0.6 - 1.1 mg/dL Final  08/14/2015 12:22 PM 0.8 0.6 - 1.1 mg/dL Final  06/26/2015 12:41 PM 0.8 0.6 - 1.1 mg/dL Final  05/25/2015 08:15 AM 0.8 0.6 - 1.1 mg/dL Final  01/20/2015 05:00 AM 0.97 0.50 - 1.10 mg/dL Final    PMH:   Past Medical History  Diagnosis Date  . Hypertension   . Glaucoma   . Diabetes mellitus without complication ([place])   . Palpitations   . Muscle cramps   . Postmenopausal vaginal bleeding   . Uterine cancer ([facility])     PSH:   Past Surgical History  Procedure Laterality Date  . Robotic assisted total hysterectomy with bilateral salpingo oopherectomy Bilateral 01/19/2015    Procedure: XI ROBOTIC ASSISTED TOTAL HYSTERECTOMY WITH BILATERAL SALPINGO OOPHORECTOMY;  Surgeon: [name], MD;  Location: [facility] ORS;  Service: Gynecology;  Laterality: Bilateral;    Allergies: No Known Allergies  Medications:   Prior to Admission medications   Medication Sig Start Date End Date Taking? Authorizing Provider  acetaminophen (TYLENOL) 325 MG tablet Take 650 mg by mouth every 6 (six) hours as needed for mild pain, moderate pain or headache. Reported on 01/08/2016   Yes Historical  Provider, MD  ferrous fumarate (HEMOCYTE - 106 MG FE) 325 (106 FE) MG TABS tablet Take 1 tablet daily  on an empty stomach with OJ or vitamin C tablet 09/11/15  Yes [name], MD  glipiZIDE (GLUCOTROL XL) 5 MG 24 hr tablet Take 5 mg by mouth 2 (two) times daily.   Yes Historical Provider, MD  latanoprost (XALATAN) 0.005 % ophthalmic solution Place 1 drop into both eyes at bedtime.   Yes Historical Provider, MD  lidocaine-prilocaine (EMLA) cream Apply to Porta-Cath 1-2 hrs prior to access as directed. 01/02/16  Yes [name], MD  magnesium 30 MG  tablet Take 30 mg by mouth daily.   Yes Historical Provider, MD  metFORMIN (GLUCOPHAGE) 1000 MG tablet Take 1,000 mg by mouth 2 (two) times daily. 12/12/15  Yes Historical Provider, MD  oxyCODONE (ROXICODONE) 5 MG immediate release tablet Take 1 tablet every 4-6 hours as needed for pain. 01/08/16  Yes [name], MD  pantoprazole (PROTONIX) 40 MG tablet Take 1 tablet (40 mg total) by mouth daily. 11/20/15  Yes Everitt Amber, MD  pravastatin (PRAVACHOL) 20 MG tablet Take 20 mg by mouth daily.   Yes Historical Provider, MD  timolol (BETIMOL) 0.5 % ophthalmic solution Place 1 drop into both eyes 2 (two) times daily.   Yes Historical Provider, MD  feeding supplement, GLUCERNA SHAKE, (GLUCERNA SHAKE) LIQD Take 237 mLs by mouth daily. Patient not taking: Reported on 01/08/2016 01/04/16   Silver Huguenin Elgergawy, MD  furosemide (LASIX) 40 MG tablet Take 1 tablet (40 mg total) by mouth 2 (two) times daily. Patient not taking: Reported on 01/08/2016 01/07/16   Silver Huguenin Elgergawy, MD  gabapentin (NEURONTIN) 100 MG capsule Take 1 capsule (100 mg total) by mouth at bedtime. Take 1 at bedtime for neuropathy Patient not taking: Reported on 01/04/2016 08/14/15   [name], MD  LORazepam (ATIVAN) 0.5 MG tablet Place 1 tablet under tongue or swallow every 8 hours as needed for nausea. Will make drowsy. Patient not taking: Reported on 01/08/2016 01/07/16   Silver Huguenin Elgergawy, MD  ondansetron (ZOFRAN) 8 MG tablet Take 1 tablet by mouth every 8 hours as needed for nausea. Will not make drowsy. Patient not taking: Reported on 01/08/2016 01/02/16   [name], MD  prochlorperazine (COMPAZINE) 10 MG tablet Take 1 tablet (10 mg total) by mouth every 6 (six) hours as needed for nausea or vomiting. Patient not taking: Reported on 01/08/2016 01/02/16   [name], MD  protein supplement shake (PREMIER PROTEIN) LIQD Take 237 mLs (8 oz total) by mouth 2 (two) times daily between meals. Patient not taking: Reported on 01/11/2016 01/04/16    Albertine Patricia, MD    Inpatient medications: . heparin  5,000 Units Subcutaneous 3 times per day  . hydrocortisone sodium succinate  50 mg Intravenous Q6H  . insulin aspart  0-15 Units Subcutaneous 6 times per day  . pantoprazole (PROTONIX) IV  40 mg Intravenous QHS  . piperacillin-tazobactam (ZOSYN)  IV  2.25 g Intravenous Q8H  . sodium chloride flush  10-40 mL Intracatheter Q12H    Discontinued Meds:   Medications Discontinued During This Encounter  Medication Reason  . 0.9 %  sodium chloride infusion   . pantoprazole (PROTONIX) injection 40 mg   . pantoprazole (PROTONIX) injection 40 mg     Social History:  reports that she has never smoked. She has never used smokeless tobacco. She reports that she does not drink alcohol  or use illicit drugs.  Family History:   Family History  Problem Relation Age of Onset  . Diabetes Father   . Arrhythmia Mother     Review of systems not obtained due to patient factors. Weight change:   Intake/Output Summary (Last 24 hours) at 01/12/16 0317 Last data filed at 01/12/16 0200  Gross per 24 hour  Intake    759 ml  Output     30 ml  Net    729 ml   BP 98/71 mmHg  Pulse 76  Resp 35  Ht 5\' 2"  (1.575 m)  Wt 78 kg (171 lb 15.3 oz)  BMI 31.44 kg/m2  SpO2 100% Filed Vitals:   01/11/16 2330 01/12/16 0000 01/12/16 0002 01/12/16 0200  BP: 99/65 119/83 128/71 98/71  Pulse: 70  76   Resp:  35 35 35  Height:  5\' 2"  (1.575 m)    Weight:  78 kg (171 lb 15.3 oz)    SpO2: 100%  100%      General appearance: intubated and sedated Head: Normocephalic, without obvious abnormality, atraumatic Resp: clear to auscultation bilaterally Cardio: regular rate and rhythm, S1, S2 normal, no murmur, click, rub or gallop GI: distended and firm Extremities: extremities normal, atraumatic, no cyanosis or edema  Labs: Basic Metabolic Panel:  Recent Labs Lab 01/06/16 0540 01/06/16 0900  01/07/16 0530 01/07/16 1130 01/08/16 1132  01/11/16 1652 01/11/16 2109 01/11/16 2205 01/12/16 0122  NA 120* 119*  < > 121* 124* 122 Repeated and Verified* 119* 120* 122* 121*  K 4.2 4.2  --  4.2  --  4.7 7.2* 6.7* 6.8* 6.3*  CL 97* 96*  --  96*  --   --  93* 95* 97* 92*  CO2 18* 16*  --  19*  --  16* 13* 14* 14* 17*  GLUCOSE 174* 199*  --  182*  --  260* 369* 377* 373* 399*  BUN 22* 24*  --  28*  --  30.0* 61* 61* 64* 63*  CREATININE 1.41* 1.43*  --  1.48*  --  1.6* 3.61* 3.56* 3.76* 3.73*  ALBUMIN  --   --   --   --   --  1.7* 2.1*  --   --   --   CALCIUM 7.4* 7.2*  --  7.7*  --  8.3* 8.6* 8.0* 8.0* 7.8*  < > = values in this interval not displayed. Liver Function Tests:  Recent Labs Lab 01/08/16 1132 01/11/16 1652  AST 16 25  ALT 11 14  ALKPHOS 73 93  BILITOT 0.71 0.9  PROT 5.1* 5.7*  ALBUMIN 1.7* 2.1*   No results for input(s): LIPASE, AMYLASE in the last 168 hours. No results for input(s): AMMONIA in the last 168 hours. CBC:  Recent Labs Lab 01/07/16 0530 01/08/16 1132 01/10/16 1323 01/11/16 1652  WBC 13.8* 15.5* 15.2* 20.2*  NEUTROABS  --  13.0* 13.1* 18.4*  HGB 9.9* 9.9* 8.6* 12.9  HCT 27.6* 30.2* 26.8* 37.6  MCV 85.2 89.6 89.6 87.6  PLT 234 242 245 257   Brittany Schwartz/INR: @LABRCNTIP (inr:5) Cardiac Enzymes: ) Recent Labs Lab 01/11/16 2205  TROPONINI 0.26*   CBG:  Recent Labs Lab 01/06/16 2205 01/07/16 0754 01/07/16 1129 01/11/16 1600 01/11/16 2159  GLUCAP 294* 219* 224* 278* 347*    Iron Studies: No results for input(s): IRON, TIBC, TRANSFERRIN, FERRITIN in the last 168 hours.  Xrays/Other Studies: Ct Abdomen Pelvis Wo Contrast  01/11/2016  CLINICAL DATA:  Advanced endometrial cancer with  peritoneal spread. Recent transfusion for anemia, subsequently developing shortness of breath. EXAM: CT CHEST, ABDOMEN AND PELVIS WITHOUT CONTRAST TECHNIQUE: Multidetector CT imaging of the chest, abdomen and pelvis was performed following the standard protocol without IV contrast. COMPARISON:  CT abdomen  pelvis with contrast 01/08/2016. FINDINGS: CT CHEST FINDINGS Patient has been intubated. Endotracheal tube is near carina and should be withdrawn 3-4 cm. Port-A-Cath tip unchanged proximal RIGHT atrium. New LEFT IJ dual-lumen venous catheter distal SVC. No pneumothorax. Mediastinum/Lymph Nodes: No masses or pathologically enlarged lymph nodes identified on this un-enhanced exam. Lungs/Pleura: BILATERAL pleural effusions, larger on the RIGHT. Suspected LEFT greater than RIGHT lower lobe infiltrates. Perihilar opacities suggesting superimposed pulmonary edema. Musculoskeletal: No chest wall mass or suspicious bone lesions identified. CT ABDOMEN PELVIS FINDINGS Hepatobiliary: No mass visualized on this un-enhanced exam. Pancreas: No mass or inflammatory process identified on this un-enhanced exam. Spleen: Within normal limits in size. Adrenals/Urinary Tract: LEFT hydronephrosis redemonstrated. BILATERAL contrast nephrograms persists from the February 6 scan indicating acute renal failure. Large pelvic mass. Extrinsic mass effect on the bladder with posterior wall invasion and LEFT UVJ mass effect contributes to hydronephrosis. Stomach/Bowel: No evidence of obstruction, inflammatory process, or abnormal fluid collections. Vascular/Lymphatic: No pathologically enlarged lymph nodes. No evidence of abdominal aortic aneurysm. Reproductive: Status post hysterectomy. Widespread omental tumor. Marked worsening of ascites. Other: None. Musculoskeletal:  No suspicious bone lesions identified. IMPRESSION: Marked worsening aeration since priors. Pulmonary edema with superimposed lower lobe infiltrates, LEFT greater than RIGHT. Endotracheal tube too low.  Withdrawal 3-4 cm. Marked worsening ascites. Pelvic tumor redemonstrated with bladder invasion. Persistent contrast nephrograms with LEFT hydronephrosis. Electronically Signed   By: Staci Righter M.D.   On: 01/11/2016 21:56   Ct Chest Wo Contrast  01/11/2016  CLINICAL DATA:   Advanced endometrial cancer with peritoneal spread. Recent transfusion for anemia, subsequently developing shortness of breath. EXAM: CT CHEST, ABDOMEN AND PELVIS WITHOUT CONTRAST TECHNIQUE: Multidetector CT imaging of the chest, abdomen and pelvis was performed following the standard protocol without IV contrast. COMPARISON:  CT abdomen pelvis with contrast 01/08/2016. FINDINGS: CT CHEST FINDINGS Patient has been intubated. Endotracheal tube is near carina and should be withdrawn 3-4 cm. Port-A-Cath tip unchanged proximal RIGHT atrium. New LEFT IJ dual-lumen venous catheter distal SVC. No pneumothorax. Mediastinum/Lymph Nodes: No masses or pathologically enlarged lymph nodes identified on this un-enhanced exam. Lungs/Pleura: BILATERAL pleural effusions, larger on the RIGHT. Suspected LEFT greater than RIGHT lower lobe infiltrates. Perihilar opacities suggesting superimposed pulmonary edema. Musculoskeletal: No chest wall mass or suspicious bone lesions identified. CT ABDOMEN PELVIS FINDINGS Hepatobiliary: No mass visualized on this un-enhanced exam. Pancreas: No mass or inflammatory process identified on this un-enhanced exam. Spleen: Within normal limits in size. Adrenals/Urinary Tract: LEFT hydronephrosis redemonstrated. BILATERAL contrast nephrograms persists from the February 6 scan indicating acute renal failure. Large pelvic mass. Extrinsic mass effect on the bladder with posterior wall invasion and LEFT UVJ mass effect contributes to hydronephrosis. Stomach/Bowel: No evidence of obstruction, inflammatory process, or abnormal fluid collections. Vascular/Lymphatic: No pathologically enlarged lymph nodes. No evidence of abdominal aortic aneurysm. Reproductive: Status post hysterectomy. Widespread omental tumor. Marked worsening of ascites. Other: None. Musculoskeletal:  No suspicious bone lesions identified. IMPRESSION: Marked worsening aeration since priors. Pulmonary edema with superimposed lower lobe  infiltrates, LEFT greater than RIGHT. Endotracheal tube too low.  Withdrawal 3-4 cm. Marked worsening ascites. Pelvic tumor redemonstrated with bladder invasion. Persistent contrast nephrograms with LEFT hydronephrosis. Electronically Signed   By: Roderic Ovens.D.  On: 01/11/2016 21:56   Dg Chest Port 1 View  01/11/2016  CLINICAL DATA:  Status post intubation and central line placement EXAM: PORTABLE CHEST 1 VIEW COMPARISON:  January 11, 2016 FINDINGS: Endotracheal tube is identified with distal tip 1.4 cm from carina. Retraction by 2.5 cm is recommended. Dual lumen left jugular central venous line are identified with distal tip in the superior vena cava. Right central venous line is identified with distal tip in the superior vena cava unchanged. There is pulmonary edema. There is no pleural effusion or focal pneumonia. There is no pneumothorax. The osseous structures are stable. IMPRESSION: Endotracheal tube distal tip 1.5 cm from carina. Retraction by 2.5 cm is recommended. Left central venous line distal tips in the superior vena cava. No pneumothorax. Pulmonary edema. These results will be called to the ordering clinician or representative by the Radiologist Assistant, and communication documented in the PACS or zVision Dashboard. Electronically Signed   By: Abelardo Diesel M.D.   On: 01/11/2016 21:13   Dg Chest Portable 1 View  01/11/2016  CLINICAL DATA:  History of uterine cancer with abdominal ascites. Patient became hypertensive and diaphoretic during blood transfusion. EXAM: PORTABLE CHEST 1 VIEW COMPARISON:  CT of the chest 01/13/2015 FINDINGS: Right internal jugular approach injectable port terminates within the expected location of superior vena cava. Cardiomediastinal silhouette is normal. Mediastinal contours appear intact. There is no evidence of pneumothorax. Lung volumes are low. There is bilateral interstitial thickening which may be seen with pulmonary vascular congestion. More confluent left  lower lobe airspace consolidation cannot be excluded. Osseous structures are without acute abnormality. Soft tissues are grossly normal. IMPRESSION: Low lung volumes with bilateral interstitial thickening, likely representing pulmonary vascular congestion. More confluent left lower lobe airspace consolidation cannot be excluded. Electronically Signed   By: Fidela Salisbury M.D.   On: 01/11/2016 16:51     Assessment/Plan: 1.  ARF, oliguric- in setting of progressive endometrial carcinoma, left-sided hydronephrosis, hypotension, 2 IV contrasted studies and 2 large volume paracenteses.  Her Scr has been rising since 01/05/16 and was not rechecked following her 2nd large volume paracentesis on 01/09/16. 1. Will plan for initiation of CVVHD and UF as tolerated 2. Patient has poor prognosis due to quickly recurring/progressive endometrial carcinoma immediately following 6 months of chemotherapy, however family wants to pursue CRRT at this time, although she is a partial code. 3. Will try without heparin due to bloody ascites but may need to add heparin if clotting becomes an issue (normal platelets) 2. VDRF- due to pulmonary edema, per PCCM.  Will try to UF as BP tolerates 3. SIRS- on levophed  4. Hyperkalemia- with some response with IV bicarb drip, plan to initiate CVVHD and follow 5. Metabolic acidosis- was on metformin prior to admission. 6. Hyponatremia- chronic, related to malignant ascites/endometrial carcinoma 7. Endometrioid endometrial carcinoma- progressive despite chemotherapy 8. Anemia- related to chemo and bloody ascites.  Don't think her SOB was related to anemia as her lowest Hgb was only 8.6, most likely was due to worsening malignant ascites.  Continue to follow.  9. Vascular access- RIJ trialysis catheter placed 11/09/16 by PCCM, unfortunately no heparin was instilled and one port is clotted.  Awaiting activase to that port before CVVHD can be initiated. 10. Disposition- poor overall  prognosis, patient is currently a partial code, agree with current medical management but agree not to escalate further.  11. DM- per primary svc   Donetta Potts 01/12/2016, 3:17 AM

## 2016-01-13 ENCOUNTER — Inpatient Hospital Stay (HOSPITAL_COMMUNITY): Payer: BLUE CROSS/BLUE SHIELD | Admitting: Registered Nurse

## 2016-01-13 ENCOUNTER — Inpatient Hospital Stay (HOSPITAL_COMMUNITY): Payer: BLUE CROSS/BLUE SHIELD

## 2016-01-13 ENCOUNTER — Encounter (HOSPITAL_COMMUNITY)
Admission: EM | Disposition: A | Discharge status: Skilled Nursing Facility | Payer: Self-pay | Source: Home / Self Care | Attending: Internal Medicine

## 2016-01-13 DIAGNOSIS — Q620 Congenital hydronephrosis: Secondary | ICD-10-CM

## 2016-01-13 DIAGNOSIS — N189 Chronic kidney disease, unspecified: Secondary | ICD-10-CM

## 2016-01-13 DIAGNOSIS — R579 Shock, unspecified: Secondary | ICD-10-CM

## 2016-01-13 HISTORY — PX: CYSTOSCOPY W/ URETERAL STENT PLACEMENT: SHX1429

## 2016-01-13 LAB — GLUCOSE, CAPILLARY
GLUCOSE-CAPILLARY: 122 mg/dL — AB (ref 65–99)
GLUCOSE-CAPILLARY: 113 mg/dL — AB (ref 65–99)
GLUCOSE-CAPILLARY: 133 mg/dL — AB (ref 65–99)
Glucose-Capillary: 161 mg/dL — ABNORMAL HIGH (ref 65–99)
Glucose-Capillary: 130 mg/dL — ABNORMAL HIGH (ref 65–99)

## 2016-01-13 LAB — RENAL FUNCTION PANEL
ALBUMIN: 1.6 g/dL — AB (ref 3.5–5.0)
ALBUMIN: 1.7 g/dL — AB (ref 3.5–5.0)
ANION GAP: 10 (ref 5–15)
ANION GAP: 9 (ref 5–15)
BUN: 34 mg/dL — ABNORMAL HIGH (ref 6–20)
BUN: 29 mg/dL — AB (ref 6–20)
CHLORIDE: 100 mmol/L — AB (ref 101–111)
CO2: 22 mmol/L (ref 22–32)
CO2: 24 mmol/L (ref 22–32)
Calcium: 7.3 mg/dL — ABNORMAL LOW (ref 8.9–10.3)
Calcium: 7.6 mg/dL — ABNORMAL LOW (ref 8.9–10.3)
Chloride: 98 mmol/L — ABNORMAL LOW (ref 101–111)
Creatinine, Ser: 1.94 mg/dL — ABNORMAL HIGH (ref 0.44–1.00)
Creatinine, Ser: 1.83 mg/dL — ABNORMAL HIGH (ref 0.44–1.00)
GFR calc Af Amer: 30 mL/min — ABNORMAL LOW (ref >60)
GFR calc Af Amer: 32 mL/min — ABNORMAL LOW (ref >60)
GFR, EST NON AFRICAN AMERICAN: 26 mL/min — AB (ref >60)
GFR, EST NON AFRICAN AMERICAN: 28 mL/min — AB (ref >60)
GLUCOSE: 155 mg/dL — AB (ref 65–99)
Glucose, Bld: 146 mg/dL — ABNORMAL HIGH (ref 65–99)
PHOSPHORUS: 4.4 mg/dL (ref 2.5–4.6)
POTASSIUM: 4.7 mmol/L (ref 3.5–5.1)
Phosphorus: 4.5 mg/dL (ref 2.5–4.6)
Potassium: 5 mmol/L (ref 3.5–5.1)
SODIUM: 132 mmol/L — AB (ref 135–145)
Sodium: 131 mmol/L — ABNORMAL LOW (ref 135–145)

## 2016-01-13 LAB — CBC WITH DIFFERENTIAL/PLATELET
Basophils Absolute: 0 10*3/uL (ref 0.0–0.1)
Basophils Relative: 0 %
EOS ABS: 0 10*3/uL (ref 0.0–0.7)
EOS PCT: 0 %
HCT: 29.7 % — ABNORMAL LOW (ref 36.0–46.0)
Hemoglobin: 10.4 g/dL — ABNORMAL LOW (ref 12.0–15.0)
LYMPHS PCT: 2 %
Lymphs Abs: 0.4 10*3/uL — ABNORMAL LOW (ref 0.7–4.0)
MCH: 30.5 pg (ref 26.0–34.0)
MCHC: 35 g/dL (ref 30.0–36.0)
MCV: 87.1 fL (ref 78.0–100.0)
Monocytes Absolute: 0.2 10*3/uL (ref 0.1–1.0)
Monocytes Relative: 2 %
Neutro Abs: 15.6 10*3/uL — ABNORMAL HIGH (ref 1.7–7.7)
Neutrophils Relative %: 96 %
PLATELETS: 111 10*3/uL — AB (ref 150–400)
RBC: 3.41 MIL/uL — AB (ref 3.87–5.11)
RDW: 13.6 % (ref 11.5–15.5)
WBC: 16.3 10*3/uL — AB (ref 4.0–10.5)

## 2016-01-13 LAB — HEMOGLOBIN A1C
HEMOGLOBIN A1C: 6.5 % — AB (ref 4.8–5.6)
MEAN PLASMA GLUCOSE: 140 mg/dL

## 2016-01-13 LAB — URINE CULTURE: Special Requests: NORMAL

## 2016-01-13 LAB — MAGNESIUM: Magnesium: 2.2 mg/dL (ref 1.7–2.4)

## 2016-01-13 SURGERY — CYSTOSCOPY, WITH RETROGRADE PYELOGRAM AND URETERAL STENT INSERTION
Anesthesia: General | Site: Ureter | Laterality: Bilateral

## 2016-01-13 MED ORDER — MIDAZOLAM HCL 2 MG/2ML IJ SOLN: 1.0000 mg | INTRAMUSCULAR | Status: DC | PRN

## 2016-01-13 MED ORDER — LIDOCAINE HCL 2 % EX GEL
CUTANEOUS | Status: AC
  Filled 2016-01-13: qty 5

## 2016-01-13 MED ORDER — MEPERIDINE HCL 25 MG/ML IJ SOLN: 6.2500 mg | INTRAMUSCULAR | Status: DC | PRN

## 2016-01-13 MED ORDER — CHLORHEXIDINE GLUCONATE 0.12 % MT SOLN
OROMUCOSAL | Status: AC
  Administered 2016-01-13: 15 mL via OROMUCOSAL
  Filled 2016-01-13: qty 15

## 2016-01-13 MED ORDER — PHENYLEPHRINE HCL 10 MG/ML IJ SOLN
INTRAMUSCULAR | Status: DC | PRN
  Administered 2016-01-13: 120 ug via INTRAVENOUS

## 2016-01-13 MED ORDER — FENTANYL CITRATE (PF) 100 MCG/2ML IJ SOLN
INTRAMUSCULAR | Status: AC
  Filled 2016-01-13: qty 2

## 2016-01-13 MED ORDER — TBO-FILGRASTIM 300 MCG/0.5ML ~~LOC~~ SOSY
300.0000 ug | PREFILLED_SYRINGE | Freq: Every day | SUBCUTANEOUS | Status: DC
Start: 2016-01-14 — End: 2016-01-22
  Administered 2016-01-14 – 2016-01-21 (×8): 300 ug via SUBCUTANEOUS
  Filled 2016-01-13 (×10): qty 0.5

## 2016-01-13 MED ORDER — IOHEXOL 300 MG/ML  SOLN
INTRAMUSCULAR | Status: DC | PRN
  Administered 2016-01-13: 17 mL

## 2016-01-13 MED ORDER — SODIUM CHLORIDE 0.9 % IR SOLN
Status: DC | PRN
  Administered 2016-01-13: 3000 mL

## 2016-01-13 MED ORDER — FENTANYL CITRATE (PF) 100 MCG/2ML IJ SOLN
INTRAMUSCULAR | Status: DC | PRN
  Administered 2016-01-13: 50 ug via INTRAVENOUS

## 2016-01-13 MED ORDER — PROPOFOL 10 MG/ML IV BOLUS
INTRAVENOUS | Status: AC
  Filled 2016-01-13: qty 20

## 2016-01-13 MED ORDER — MIDAZOLAM HCL 5 MG/5ML IJ SOLN
INTRAMUSCULAR | Status: DC | PRN
  Administered 2016-01-13: 1 mg via INTRAVENOUS

## 2016-01-13 MED ORDER — FENTANYL CITRATE (PF) 100 MCG/2ML IJ SOLN
25.0000 ug | INTRAMUSCULAR | Status: AC | PRN
  Administered 2016-01-14 – 2016-01-15 (×2): 50 ug via INTRAVENOUS
  Administered 2016-01-17 – 2016-01-18 (×4): 25 ug via INTRAVENOUS
  Filled 2016-01-13 (×6): qty 2

## 2016-01-13 MED ORDER — LACTATED RINGERS IV SOLN
INTRAVENOUS | Status: DC | PRN
Start: 2016-01-13 — End: 2016-01-13

## 2016-01-13 MED ORDER — LIDOCAINE HCL (CARDIAC) 20 MG/ML IV SOLN
INTRAVENOUS | Status: AC
  Filled 2016-01-13: qty 5

## 2016-01-13 MED ORDER — MIDAZOLAM HCL 2 MG/2ML IJ SOLN
INTRAMUSCULAR | Status: AC
  Filled 2016-01-13: qty 2

## 2016-01-13 SURGICAL SUPPLY — 22 items
BASKET LASER NITINOL 1.9FR (BASKET) IMPLANT
BASKET STNLS GEMINI 4WIRE 3FR (BASKET) IMPLANT
BASKET ZERO TIP NITINOL 2.4FR (BASKET) IMPLANT
CATH INTERMIT  6FR 70CM (CATHETERS) ×6 IMPLANT
CLOTH BEACON ORANGE TIMEOUT ST (SAFETY) ×3 IMPLANT
ELECT REM PT RETURN 9FT ADLT (ELECTROSURGICAL)
ELECTRODE REM PT RTRN 9FT ADLT (ELECTROSURGICAL) IMPLANT
FIBER LASER FLEXIVA 1000 (UROLOGICAL SUPPLIES) IMPLANT
FIBER LASER FLEXIVA 200 (UROLOGICAL SUPPLIES) IMPLANT
FIBER LASER FLEXIVA 365 (UROLOGICAL SUPPLIES) IMPLANT
FIBER LASER FLEXIVA 550 (UROLOGICAL SUPPLIES) IMPLANT
FIBER LASER TRAC TIP (UROLOGICAL SUPPLIES) IMPLANT
GLOVE BIOGEL M STRL SZ7.5 (GLOVE) ×3 IMPLANT
GOWN STRL REUS W/TWL XL LVL3 (GOWN DISPOSABLE) ×6 IMPLANT
GUIDEWIRE ANG ZIPWIRE 038X150 (WIRE) ×3 IMPLANT
GUIDEWIRE STR DUAL SENSOR (WIRE) ×3 IMPLANT
IV NS IRRIG 3000ML ARTHROMATIC (IV SOLUTION) ×3 IMPLANT
PACK CYSTO (CUSTOM PROCEDURE TRAY) ×3 IMPLANT
STENT CONTOUR 6FRX24X.038 (STENTS) ×6 IMPLANT
SYRINGE 10CC LL (SYRINGE) IMPLANT
SYRINGE IRR TOOMEY STRL 70CC (SYRINGE) IMPLANT
TUBE FEEDING 8FR 16IN STR KANG (MISCELLANEOUS) ×3 IMPLANT

## 2016-01-13 NOTE — Brief Op Note (Signed)
01/11/2016 - 01/13/2016  4:50 PM  PATIENT:  [name]  65 y.o. female  PRE-OPERATIVE DIAGNOSIS:  bilateral hydronephrosis  POST-OPERATIVE DIAGNOSIS:  bilateral hydronephrosis  PROCEDURE:  Procedure(s): CYSTOSCOPY WITH RETROGRADE PYELOGRAM/BILATERAL URETERAL STENT PLACEMENT (Bilateral)  SURGEON:  Surgeon(s) and Role:    * [name], MD - Primary  PHYSICIAN ASSISTANT:   ASSISTANTS: Verdis Frederickson MD   ANESTHESIA:   general  EBL:  Total I/O In: 639.7 [I.V.:339.7; IV Piggyback:300] Out: 1018 [Urine:37; Other:981]  BLOOD ADMINISTERED:none  DRAINS: foley to gravity   LOCAL MEDICATIONS USED:  MARCAINE     SPECIMEN:  No Specimen  DISPOSITION OF SPECIMEN:  N/A  COUNTS:  YES  TOURNIQUET:  * No tourniquets in log *  DICTATION: .Other Dictation: Dictation Number [id number]  PLAN OF CARE: Admit to inpatient   PATIENT DISPOSITION:  ICU in critical condition   Delay start of Pharmacological VTE agent (>24hrs) due to surgical blood loss or risk of bleeding: not applicable

## 2016-01-13 NOTE — Progress Notes (Signed)
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  Now with rapid recurrent disease since mid January with large volume malignant ascites, bulky involvement pelvis and abdominal caking.  She is followed by Dr. Marko Schwartz with oncology and is currently undergoing chemo.  Family reports that oncologist feels as though there is hope for improvement with chemotherapy. She received last dose of chemotherapy 2/8. She had 2u PRBC transfusion 2/9 for symptomatic anemia.  During transfusion, she complained of SOB with diaphoresis.  She was subsequently sent to ED for further evaluation.  In ED, she was hypertensive and remained SOB.  She was therefore placed on BiPAP. CXR showed significant pulmonary edema. Laboratory assessment was significant for renal failure with K 7.2, Na 119 (chronically low), and SCr 3.61. She is also hyperglycemic with Glucose 369. Lab eval also notable for WBC 20.2. CT abdomen from a few days prior consistent with progressive mass size with compression of L ureter and L hydronephrosis. She was unable to tolerate BiPAP in ED and required intubation.  PCCM asked to admit.  Extensive discussion with family by Brittany Schwartz.  Given Brittany Schwartz's hx and current circumstances, decision made to limit code status to intubation and vasopressors only.  In the event of cardiac arrest, we would not put her through CPR or defibrillation.  Full medical management otherwise.  STUDIES:  TTE 1/31:  LV EF 65-70%. Normal wall motion. Grade 1 diastolic dysfunction. LA & RA normal in size. RV normal in size & function. PASP 21 mmHg. Normal aortic & mitral  valves. No pericardial effusion. Port CXR 2/11: No opacity. ETT in good position. R IJ CVL in good position.   MICROBIOLOGY: Blood Ctx x2 2/9>> Urine Ctx 2/9>> Blood Ctx x2 2/3:  Negative  Urine Ctx 2/2:  Negative   ANTIBIOTICS: Vanc 02/09 > Zosyn 02/09 >  SIGNIFICANT EVENTS: 02/09 > admitted with acute hypoxic respiratory failure, multiple electrolyte derangements. 02/10 > started CVVHD  LINES/TUBES: OETT 7.5 02/09 > R IJ HD cath 02/09 > Foley 2/9 > OGT 2/9 > R Chest [facility] 1/31 >  SUBJECTIVE: No acute events overnight. On CVVHD. Bicarb drip stopped after CVVHD started. Nods no to any pain or difficulty breathing.  REVIEW OF SYSTEMS:  Unobtainable as patient is intubated.  VITAL SIGNS: BP 107/60 mmHg  Pulse 82  Temp(Src) 96.4 F (35.8 C) (Core (Comment))  Resp 25  Ht 5\' 2"  (1.575 m)  Wt 76.1 kg (167 lb 12.3 oz)  BMI 30.68 kg/m2  SpO2 100%  HEMODYNAMICS:    VENTILATOR SETTINGS: Vent Mode:  [-] PRVC FiO2 (%):  [30 %-40 %] 30 % Set Rate:  [25 bmp-35 bmp] 25 bmp Vt Set:  [410 mL] 410 mL PEEP:  [5 cmH20] 5 cmH20 Plateau Pressure:  [24 cmH20-33 cmH20] 30 cmH20  INTAKE / OUTPUT: I/O last 3 completed shifts: In: 1589.4 [I.V.:1155.4; Other:34; IV Piggyback:400] Out: [id number] [Urine:104; Other:357]  PHYSICAL EXAMINATION: General:  Awake. No acute distress. Family at bedside.  Integument:  Warm & dry. No rash on exposed skin.  HEENT:  No scleral injection or icterus. Endotracheal tube in place.  Cardiovascular:  Regular rate. Normal S1 & S2. No  appreciable JVD.  Pulmonary:  Good aeration & clear to auscultation bilaterally. Symmetric chest wall rise on ventilator. Abdomen: Soft. Normal bowel sounds. Protuberant. Nontender. Neurological:  Nods to questions. Follows commands. Attends to voice.  LABS:  BMET  Recent Labs Lab 01/12/16 0322 01/12/16 0756 01/12/16 1643  NA 124* 126* 131*  K 6.3* 5.8* 5.9*  CL 94* 95* 98*  CO2 19* 19* 21*  BUN 64* 56* 52*   CREATININE 3.64* 3.13* 2.89*  GLUCOSE 382* 267* 142*    Electrolytes  Recent Labs Lab 01/12/16 0322 01/12/16 0756 01/12/16 1643  CALCIUM 7.9* 8.1* 8.0*  MG 1.9 2.1  --   PHOS 5.9* 4.6 5.2*    CBC  Recent Labs Lab 01/10/16 1323 01/11/16 1652 01/12/16 0322  WBC 15.2* 20.2* 18.2*  HGB 8.6* 12.9 11.6*  HCT 26.8* 37.6 32.8*  PLT 245 257 216    Coag's No results for input(s): APTT, INR in the last 168 hours.  Sepsis Markers  Recent Labs Lab 01/11/16 2109  LATICACIDVEN 3.0*    ABG  Recent Labs Lab 01/11/16 2201 01/12/16 0020 01/12/16 0353  PHART 7.201* 7.294* 7.400  PCO2ART 34.1* 33.2* 26.5*  PO2ART 391* 220* 152*    Liver Enzymes  Recent Labs Lab 01/08/16 1132 01/11/16 1652 01/12/16 0756 01/12/16 1643  AST 16 25  --   --   ALT 11 14  --   --   ALKPHOS 73 93  --   --   BILITOT 0.71 0.9  --   --   ALBUMIN 1.7* 2.1* 1.8* 1.8*    Cardiac Enzymes  Recent Labs Lab 01/11/16 2205  TROPONINI 0.26*    Glucose  Recent Labs Lab 01/12/16 0732 01/12/16 1134 01/12/16 1559 01/12/16 1947 01/12/16 2349 01/13/16 0353  GLUCAP 279* 181* 129* 122* 88 113*    Imaging Dg Abd 1 View  01/12/2016  CLINICAL DATA:  65 year old female - NG tube advancement. EXAM: ABDOMEN - 1 VIEW COMPARISON:  01/12/2016 FINDINGS: An NG tube is identified with tip overlying the distal stomach. No other significant abnormalities noted. IMPRESSION: NG tube with tip overlying the distal stomach. Electronically Signed   By: Brittany Schwartz M.D.   On: 01/12/2016 18:32   Dg Abd Portable 1v  01/12/2016  CLINICAL DATA:  NG tube placement. EXAM: PORTABLE ABDOMEN - 1 VIEW COMPARISON:  CT of the abdomen pelvis 01/10/2014 FINDINGS: Enteric catheter tip is seen overlying the mid upper abdomen, location is uncertain as no gastric bubble is seen, however it overlies the expected location of the proximal stomach. There is nonobstructive bowel gas pattern. Hyperdense renal shadows are seen.  Bridging osteophytes of the lumbosacral spine are noted. IMPRESSION: Enteric catheter overlies expected location of the proximal stomach. Its location is uncertain as no gastric bubble is visualized. Nonobstructive bowel gas pattern. Hyperdense appearance of the renal shadows may represent retention of contrast from CT performed 15 hours ago, indicative of renal function impairment. Electronically Signed   By: Fidela Salisbury M.D.   On: 01/12/2016 11:48    ASSESSMENT / PLAN:  PULMONARY A: Acute Hypoxic Respiratory Failure - Likely transfusion related edema vs TRALI.  P:   SBT & WUA as hemodynamics allow Albuterol neb PRN.  HEMATOLOGIC / ONCOLOGIC A:   Grade 3 serous/ endometrioid endometrial carcinoma IIIA - currently undergoing chemo under the care of Dr. Marko Schwartz. Thrombcytopenia - Mild.  P:  Oncology following SCD Heparin [place] q8hr Trending Cell Counts daily w/ CBC   CARDIOVASCULAR A:  Shock  -  Septic vs Sedation. Elevated Troponin I H/O HTN  P:  Levophed to maintain MAP>65 Monitor on telemetry Repeat TTE pending  RENAL A:   Acute Renal Failure - Oliguric. Hyperkalemia - Improving. Metabolic Acidosis - Improving. Hyponatremia - Improving. Left Hydronephrosis - Secondary to mass. Pseudohypocalcemia - corrects to 10.12.  P:   Urology plans for stent placement to relieve hydronephrosis this afternoon Nephrology following CVVHD per recommendations Trending electrolytes & BUN/Creatinine for renal function Monitoring UOP with foley  GASTROINTESTINAL A:   No acute issues.  P:   NPO Protonix IV daily  INFECTIOUS A:   Possible Sepsis w/ Shock  P:   Empiric Vancomycin & Zosyn Day #3 Awaiting Cultures PCT not useful given immunocompromised state.  ENDOCRINE A:   H/O DM - BG controlled. Possible Adrenal Insufficiency  P:   SSI per algorithm Accu-Checks q4hr Hydrocortisone q6hr for stress dose steroids  NEUROLOGIC A:   Acute Encephalopathy -  Likely toxic metabolic.  P:   RASS goal: 0  Daily WUA. Fentanyl drip Versed IV prn Hold outpatient oxycodone, gabapentin, lorazepam, compazine.  Family Updates: Daughter updated at bedside by Dr. Ashok Cordia 2/11.  TODAY'S SUMMARY: Dismal prognosis overall. Patient's mental status in tact. Plan for SBT tomorrow AM and possible paracentesis prior to extubation.  I have spent a total of 37 minutes of critical care time today caring for the patient, reviewing the patients electronic medical record and discussing the plan of care with family at bedside.  Sonia Baller Ashok Cordia, M.D. [facility] Pulmonary & Critical Care Pager:  [phone] After 3pm or if no response, call [phone] 01/13/2016, 6:04 AM

## 2016-01-13 NOTE — Progress Notes (Signed)
[facility] Progress Note   Subjective: on vent, I/O even, wt's down 76kg.    Filed Vitals:   01/13/16 0500 01/13/16 0530 01/13/16 0600 01/13/16 0700  BP: 107/60  113/54 129/66  Pulse:      Temp: 96.4 F (35.8 C)  96.1 F (35.6 C) 95.7 F (35.4 C)  TempSrc:      Resp: 25  25 25   Height:      Weight:      SpO2: 100% 100% 100% 100%    Inpatient medications: . antiseptic oral rinse  7 mL Mouth Rinse QID  . chlorhexidine gluconate  15 mL Mouth Rinse BID  . heparin  5,000 Units Subcutaneous 3 times per day  . hydrocortisone sodium succinate  50 mg Intravenous Q6H  . insulin aspart  0-15 Units Subcutaneous 6 times per day  . pantoprazole (PROTONIX) IV  40 mg Intravenous QHS  . piperacillin-tazobactam  3.375 g Intravenous 4 times per day  . sodium chloride flush  10-40 mL Intracatheter Q12H  . vancomycin  1,000 mg Intravenous Q24H   . fentaNYL infusion INTRAVENOUS 50 mcg/hr (01/12/16 1048)  . norepinephrine (LEVOPHED) Adult infusion 10 mcg/min (01/13/16 0747)  . dialysis replacement fluid (prismasate) 500 mL/hr at 01/13/16 0535  . dialysis replacement fluid (prismasate) 300 mL/hr at 01/13/16 0038  . dialysate (PRISMASATE) 1,500 mL/hr at 01/13/16 0651  .  sodium bicarbonate  infusion 1000 mL Stopped (01/12/16 0548)   sodium chloride, fentaNYL, heparin, heparin, midazolam, sodium chloride flush  Exam: On vent intubated; NG in place No jvd Chest clear bilat  RRR no mrg, tachy Abd marked distension w ascites, nontender 2-3+ pitting LE edema bilat Neuro responds approp  CXR today - clear CT chest 2/9 > perihilar edema, bibasilar consolidation L> R K 5.0  Na 132 Ca 7.3  Alb 1.6 adjCa 9.3  P 4.4 Hb 10.5 plt 111   WBC down 16k ECHO 1/31 > normal LV/ RV  Assessment: 1 Acute renal failure - multifact (L hydro d/t pelv mass/ hypotension/ 2 CT w IV contrast/ 2 larg vol paracenteses). CRRT D#2. No heparin given bloody ascites. Plts normal.  2 Acute resp failure -  acute SOB during prbc transfusion, poss TRALI vs pulm edema. CT showed basilar consolidation as well. On emp abx, CXR today better/ clear 3 Recurrent endometrial Ca - finished chemo Jul '16 (stage IIIA at dx), recurred in Jan '17 w malig ascites/ bulky pelvic disease/ oment caking. Back on chemo again, last dose 2/8.   4 Vol excess/ anasarca / ascites - suspect intravasc vol normal. Alb very low and ascites may be all malignant 5 Shock / hypotension - on levo still 10 ug/min 6 Partial DNR- no defib/ CPR, yes to vent/ meds  Plan - cont CRRT    Kelly Splinter MD [facility] pager [phone]    cell [phone] 01/13/2016, 8:04 AM    Recent Labs Lab 01/12/16 0756 01/12/16 1643 01/13/16 0500  NA 126* 131* 132*  K 5.8* 5.9* 5.0  CL 95* 98* 100*  CO2 19* 21* 22  GLUCOSE 267* 142* 155*  BUN 56* 52* 34*  CREATININE 3.13* 2.89* 1.94*  CALCIUM 8.1* 8.0* 7.3*  PHOS 4.6 5.2* 4.4    Recent Labs Lab 01/08/16 1132  01/11/16 1652 01/12/16 0756 01/12/16 1643 01/13/16 0500  AST 16  --  25  --   --   --   ALT 11  --  14  --   --   --  ALKPHOS 73  --  93  --   --   --   BILITOT 0.71  --  0.9  --   --   --   PROT 5.1*  --  5.7*  --   --   --   ALBUMIN 1.7*  < > 2.1* 1.8* 1.8* 1.6*  < > = values in this interval not displayed.  Recent Labs Lab 01/10/16 1323 01/11/16 1652 01/12/16 0322 01/13/16 0500  WBC 15.2* 20.2* 18.2* 16.3*  NEUTROABS 13.1* 18.4*  --  15.6*  HGB 8.6* 12.9 11.6* 10.4*  HCT 26.8* 37.6 32.8* 29.7*  MCV 89.6 87.6 87.2 87.1  PLT 245 257 216 111*

## 2016-01-13 NOTE — Progress Notes (Signed)
Subjective:  1 - Left Malignant Hydronephrosis - New mild left hydro by CT this admission on eval GYN malignancy. Dilation to level of deep pelvis at level of large recurrent GYN mass.   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 All services including oncology agree guarded prognosis   3 - Acute Renal Failure - Cr high 3s with hyperkalemia 01/10/16, now on CRRT. Had contrast CT 2/6, repeat non-con CT 2/9 with persistant nephrogram. Baseline Cr >1 1 mo ago and approx 1.5 on admit. Mild mod-left hydro with delayed contrast excretion on imaging 01/10/15.   Today Brittany Schwartz is stable. On pressors (slowly weaning) and CRRT. Patient and family want to proceed with bilateral stents.  Although partially sedated she is alert and affirms understanding of goals as does her husband and family.   Objective: Vital signs in last 24 hours: Temp:  [95.7 F (35.4 C)-99 F (37.2 C)] 96.1 F (35.6 C) (02/11 0900) Pulse Rate:  [81-100] 94 (02/11 0900) Resp:  [24-28] 25 (02/11 0900) BP: (67-142)/(35-85) 88/52 mmHg (02/11 0900) SpO2:  [99 %-100 %] 100 % (02/11 0900) FiO2 (%):  [30 %-40 %] 30 % (02/11 0338) Weight:  [76.1 kg (167 lb 12.3 oz)] 76.1 kg (167 lb 12.3 oz) (02/11 0400) Last BM Date:  (PTA)  Intake/Output from previous day: 02/10 0701 - 02/11 0700 In: 1014.5 [I.V.:810.5; IV Piggyback:150] Out: 1233 [Urine:94] Intake/Output this shift: Total I/O In: 334.8 [I.V.:84.8; IV Piggyback:250] Out: 242 [Urine:11; Other:231]  General appearance: alert and mild sedation. Husband and daughters at bedside Eyes: negative Throat: lips, mucosa, and tongue normal; teeth and gums normal and ETT in place on vent Neck: supple, symmetrical, trachea midline and [place] central line on CRRT Cardio: regular mid tachycardia by bedside monitor GI: distended abd c/w known malignant  ascites Extremities: extremities normal, atraumatic, no cyanosis or edema Lymph nodes: Cervical, supraclavicular, and axillary nodes normal. Neurologic: Mental status: alert answers yes no questions promptly  Lab Results:   Recent Labs  01/12/16 0322 01/13/16 0500  WBC 18.2* 16.3*  HGB 11.6* 10.4*  HCT 32.8* 29.7*  PLT 216 111*   BMET  Recent Labs  01/12/16 1643 01/13/16 0500  NA 131* 132*  K 5.9* 5.0  CL 98* 100*  CO2 21* 22  GLUCOSE 142* 155*  BUN 52* 34*  CREATININE 2.89* 1.94*  CALCIUM 8.0* 7.3*   PT/INR No results for input(s): LABPROT, INR in the last 72 hours. ABG  Recent Labs  01/12/16 0020 01/12/16 0353  PHART 7.294* 7.400  HCO3 15.6* 16.6*    Studies/Results: Ct Abdomen Pelvis Wo Contrast  01/11/2016  CLINICAL DATA:  Advanced endometrial cancer with peritoneal spread. Recent transfusion for anemia, subsequently developing shortness of breath. EXAM: CT CHEST, ABDOMEN AND PELVIS WITHOUT CONTRAST TECHNIQUE: Multidetector CT imaging of the chest, abdomen and pelvis was performed following the standard protocol without IV contrast. COMPARISON:  CT abdomen pelvis with contrast 01/08/2016. FINDINGS: CT CHEST FINDINGS Patient has been intubated. Endotracheal tube is near carina and should be withdrawn 3-4 cm. Port-A-Cath tip unchanged proximal RIGHT atrium. New LEFT IJ dual-lumen venous catheter distal SVC. No pneumothorax. Mediastinum/Lymph Nodes: No masses or pathologically enlarged lymph nodes identified on this un-enhanced exam. Lungs/Pleura: BILATERAL pleural effusions, larger on the RIGHT. Suspected LEFT greater than RIGHT lower lobe infiltrates. Perihilar opacities suggesting superimposed pulmonary edema. Musculoskeletal: No chest wall mass or suspicious bone lesions  identified. CT ABDOMEN PELVIS FINDINGS Hepatobiliary: No mass visualized on this un-enhanced exam. Pancreas: No mass or inflammatory process identified on this un-enhanced exam. Spleen: Within normal  limits in size. Adrenals/Urinary Tract: LEFT hydronephrosis redemonstrated. BILATERAL contrast nephrograms persists from the February 6 scan indicating acute renal failure. Large pelvic mass. Extrinsic mass effect on the bladder with posterior wall invasion and LEFT UVJ mass effect contributes to hydronephrosis. Stomach/Bowel: No evidence of obstruction, inflammatory process, or abnormal fluid collections. Vascular/Lymphatic: No pathologically enlarged lymph nodes. No evidence of abdominal aortic aneurysm. Reproductive: Status post hysterectomy. Widespread omental tumor. Marked worsening of ascites. Other: None. Musculoskeletal:  No suspicious bone lesions identified. IMPRESSION: Marked worsening aeration since priors. Pulmonary edema with superimposed lower lobe infiltrates, LEFT greater than RIGHT. Endotracheal tube too low.  Withdrawal 3-4 cm. Marked worsening ascites. Pelvic tumor redemonstrated with bladder invasion. Persistent contrast nephrograms with LEFT hydronephrosis. Electronically Signed   By: Staci Righter M.D.   On: 01/11/2016 21:56   Dg Abd 1 View  01/12/2016  CLINICAL DATA:  65 year old female - NG tube advancement. EXAM: ABDOMEN - 1 VIEW COMPARISON:  01/12/2016 FINDINGS: An NG tube is identified with tip overlying the distal stomach. No other significant abnormalities noted. IMPRESSION: NG tube with tip overlying the distal stomach. Electronically Signed   By: Margarette Canada M.D.   On: 01/12/2016 18:32   Ct Chest Wo Contrast  01/11/2016  CLINICAL DATA:  Advanced endometrial cancer with peritoneal spread. Recent transfusion for anemia, subsequently developing shortness of breath. EXAM: CT CHEST, ABDOMEN AND PELVIS WITHOUT CONTRAST TECHNIQUE: Multidetector CT imaging of the chest, abdomen and pelvis was performed following the standard protocol without IV contrast. COMPARISON:  CT abdomen pelvis with contrast 01/08/2016. FINDINGS: CT CHEST FINDINGS Patient has been intubated. Endotracheal tube is  near carina and should be withdrawn 3-4 cm. Port-A-Cath tip unchanged proximal RIGHT atrium. New LEFT IJ dual-lumen venous catheter distal SVC. No pneumothorax. Mediastinum/Lymph Nodes: No masses or pathologically enlarged lymph nodes identified on this un-enhanced exam. Lungs/Pleura: BILATERAL pleural effusions, larger on the RIGHT. Suspected LEFT greater than RIGHT lower lobe infiltrates. Perihilar opacities suggesting superimposed pulmonary edema. Musculoskeletal: No chest wall mass or suspicious bone lesions identified. CT ABDOMEN PELVIS FINDINGS Hepatobiliary: No mass visualized on this un-enhanced exam. Pancreas: No mass or inflammatory process identified on this un-enhanced exam. Spleen: Within normal limits in size. Adrenals/Urinary Tract: LEFT hydronephrosis redemonstrated. BILATERAL contrast nephrograms persists from the February 6 scan indicating acute renal failure. Large pelvic mass. Extrinsic mass effect on the bladder with posterior wall invasion and LEFT UVJ mass effect contributes to hydronephrosis. Stomach/Bowel: No evidence of obstruction, inflammatory process, or abnormal fluid collections. Vascular/Lymphatic: No pathologically enlarged lymph nodes. No evidence of abdominal aortic aneurysm. Reproductive: Status post hysterectomy. Widespread omental tumor. Marked worsening of ascites. Other: None. Musculoskeletal:  No suspicious bone lesions identified. IMPRESSION: Marked worsening aeration since priors. Pulmonary edema with superimposed lower lobe infiltrates, LEFT greater than RIGHT. Endotracheal tube too low.  Withdrawal 3-4 cm. Marked worsening ascites. Pelvic tumor redemonstrated with bladder invasion. Persistent contrast nephrograms with LEFT hydronephrosis. Electronically Signed   By: Staci Righter M.D.   On: 01/11/2016 21:56   Dg Chest Port 1 View  01/13/2016  CLINICAL DATA:  Hypoxia EXAM: PORTABLE CHEST 1 VIEW COMPARISON:  January 12, 2016 FINDINGS: Endotracheal tube tip is 3.3 cm  above the carina. Dual-lumen central catheter tip in superior vena cava. Port-A-Cath tip in superior vena cava. Nasogastric tube tip and side port in stomach. No  pneumothorax. There is slight atelectasis in the lung bases. Lungs elsewhere clear. Heart size and pulmonary vascularity are normal. No adenopathy. There is degenerative change in the thoracic spine. IMPRESSION: Tube and catheter positions as described without pneumothorax. Lungs are clear except for slight bibasilar atelectasis. Cardiac silhouette within normal limits. Electronically Signed   By: Lowella Grip III M.D.   On: 01/13/2016 07:28   Dg Chest Port 1 View  01/12/2016  CLINICAL DATA:  65 year old female with respiratory failure. Metastatic endometrial cancer. Shortness of breath, intubated. Initial encounter. EXAM: PORTABLE CHEST 1 VIEW COMPARISON:  CT chest abdomen and pelvis 01/11/2016 and earlier. FINDINGS: Portable AP semi upright view at 0454 hours. Endotracheal tube tip now projects in good position just below the clavicles. Right chest porta cath remains in place. Left IJ approach dual lumen dialysis type catheter at the lower SVC level. Normal cardiac size and mediastinal contours. No pneumothorax or pulmonary edema. Confluent bilateral lower lobe consolidation is less apparent than on yesterday CT. Small right pleural effusion on that exam also is not evident today. No areas of worsening ventilation. IMPRESSION: 1. Endotracheal tube tip in good position at the level the clavicles. 2.  Otherwise, stable lines and tubes. 3. No new cardiopulmonary finding. Bilateral lower lobe consolidation compatible with pneumonia. Small right pleural effusion seen yesterday not evident radiographically. Electronically Signed   By: Genevie Ann M.D.   On: 01/12/2016 06:57   Dg Chest Port 1 View  01/11/2016  CLINICAL DATA:  Status post intubation and central line placement EXAM: PORTABLE CHEST 1 VIEW COMPARISON:  January 11, 2016 FINDINGS: Endotracheal  tube is identified with distal tip 1.4 cm from carina. Retraction by 2.5 cm is recommended. Dual lumen left jugular central venous line are identified with distal tip in the superior vena cava. Right central venous line is identified with distal tip in the superior vena cava unchanged. There is pulmonary edema. There is no pleural effusion or focal pneumonia. There is no pneumothorax. The osseous structures are stable. IMPRESSION: Endotracheal tube distal tip 1.5 cm from carina. Retraction by 2.5 cm is recommended. Left central venous line distal tips in the superior vena cava. No pneumothorax. Pulmonary edema. These results will be called to the ordering clinician or representative by the Radiologist Assistant, and communication documented in the PACS or zVision Dashboard. Electronically Signed   By: Abelardo Diesel M.D.   On: 01/11/2016 21:13   Dg Chest Portable 1 View  01/11/2016  CLINICAL DATA:  History of uterine cancer with abdominal ascites. Patient became hypertensive and diaphoretic during blood transfusion. EXAM: PORTABLE CHEST 1 VIEW COMPARISON:  CT of the chest 01/13/2015 FINDINGS: Right internal jugular approach injectable port terminates within the expected location of superior vena cava. Cardiomediastinal silhouette is normal. Mediastinal contours appear intact. There is no evidence of pneumothorax. Lung volumes are low. There is bilateral interstitial thickening which may be seen with pulmonary vascular congestion. More confluent left lower lobe airspace consolidation cannot be excluded. Osseous structures are without acute abnormality. Soft tissues are grossly normal. IMPRESSION: Low lung volumes with bilateral interstitial thickening, likely representing pulmonary vascular congestion. More confluent left lower lobe airspace consolidation cannot be excluded. Electronically Signed   By: Fidela Salisbury M.D.   On: 01/11/2016 16:51   Dg Abd Portable 1v  01/12/2016  CLINICAL DATA:  NG tube  placement. EXAM: PORTABLE ABDOMEN - 1 VIEW COMPARISON:  CT of the abdomen pelvis 01/10/2014 FINDINGS: Enteric catheter tip is seen overlying the mid upper abdomen,  location is uncertain as no gastric bubble is seen, however it overlies the expected location of the proximal stomach. There is nonobstructive bowel gas pattern. Hyperdense renal shadows are seen. Bridging osteophytes of the lumbosacral spine are noted. IMPRESSION: Enteric catheter overlies expected location of the proximal stomach. Its location is uncertain as no gastric bubble is visualized. Nonobstructive bowel gas pattern. Hyperdense appearance of the renal shadows may represent retention of contrast from CT performed 15 hours ago, indicative of renal function impairment. Electronically Signed   By: Fidela Salisbury M.D.   On: 01/12/2016 11:48    Anti-infectives: Anti-infectives    Start     Dose/Rate Route Frequency Ordered Stop   01/13/16 0800  vancomycin (VANCOCIN) IVPB 1000 mg/200 mL premix     1,000 mg 200 mL/hr over 60 Minutes Intravenous Every 24 hours 01/12/16 1305     01/12/16 1200  piperacillin-tazobactam (ZOSYN) IVPB 3.375 g     3.375 g 100 mL/hr over 30 Minutes Intravenous 4 times per day 01/12/16 1040     01/11/16 2200  piperacillin-tazobactam (ZOSYN) IVPB 2.25 g  Status:  Discontinued     2.25 g 100 mL/hr over 30 Minutes Intravenous Every 8 hours 01/11/16 2136 01/12/16 1038   01/11/16 2145  vancomycin (VANCOCIN) IVPB 1000 mg/200 mL premix     1,000 mg 200 mL/hr over 60 Minutes Intravenous NOW 01/11/16 2136 01/11/16 2357      Assessment/Plan:  1 - Left Malignant Hydronephrosis - will proceed per pateint and family request with cysto, bilat retrogrades, bilat stents. Although left side hydronephrotic, I feel she has impenidng obstrution on right as well given her GYN mass. Discussed need for approx Q73mo changes if responds. Risks, benefits, alternatives discussed again with family and pateint today who all voice  understanding and strong desire to proceed.   2 - Metastatic Serous Cystadenocarcinoma - guarded prognosis, but pt and family still desire maximally aggressive course.   3 - Acute Renal Failure - hopefully there will be some meaningful recovery with time, hydration, CRRT, and relief of obstruction for likley multifactorial etiology (contrast nephropathy, poor perfusion state, obstruction)  Brittany Schwartz 01/13/2016

## 2016-01-13 NOTE — Anesthesia Preprocedure Evaluation (Addendum)
Anesthesia Evaluation  Patient identified by MRN, date of birth, ID band Patient awake    Reviewed: Allergy & Precautions, Patient's Chart, lab work & pertinent test results  Airway Mallampati: Intubated       Dental   Pulmonary    breath sounds clear to auscultation       Cardiovascular hypertension, Pt. on medications  Rhythm:Regular Rate:Normal     Neuro/Psych TIA   GI/Hepatic GERD  Medicated and Controlled,  Endo/Other  diabetes, Well Controlled, Type 2, Insulin DependentMorbid obesity  Renal/GU DialysisRenal disease     Musculoskeletal   Abdominal   Peds  Hematology   Anesthesia Other Findings   Reproductive/Obstetrics                            Anesthesia Physical Anesthesia Plan  ASA: IV and emergent  Anesthesia Plan: General   Post-op Pain Management:    Induction: Intravenous  Airway Management Planned: Oral ETT  Additional Equipment:   Intra-op Plan:   Post-operative Plan: Post-operative intubation/ventilation  Informed Consent: I have reviewed the patients History and Physical, chart, labs and discussed the procedure including the risks, benefits and alternatives for the proposed anesthesia with the patient or authorized representative who has indicated his/her understanding and acceptance.     Plan Discussed with: Anesthesiologist, CRNA and Surgeon  Anesthesia Plan Comments: (Preexisting ETT in place)       Anesthesia Quick Evaluation

## 2016-01-13 NOTE — Anesthesia Postprocedure Evaluation (Signed)
Anesthesia Post Note  Patient: Brittany Schwartz  Procedure(s) Performed: Procedure(s) (LRB): CYSTOSCOPY WITH RETROGRADE PYELOGRAM/BILATERAL URETERAL STENT PLACEMENT (Bilateral)  Patient location during evaluation: PACU Anesthesia Type: General Level of consciousness: awake and alert Pain management: pain level controlled Vital Signs Assessment: post-procedure vital signs reviewed and stable Respiratory status: spontaneous breathing, nonlabored ventilation, respiratory function stable, patient remains intubated per anesthesia plan and patient on ventilator - see flowsheet for VS Cardiovascular status: blood pressure returned to baseline and stable Postop Assessment: no signs of nausea or vomiting Anesthetic complications: no    Last Vitals:  Filed Vitals:   01/13/16 1655 01/13/16 1733  BP:  106/52  Pulse: 102 107  Temp:    Resp: 25 25    Last Pain:  Filed Vitals:   01/13/16 1741  PainSc: 0-No pain                 Ossie Yebra A

## 2016-01-13 NOTE — Progress Notes (Signed)
  Echocardiogram 2D Echocardiogram has been performed.  Brittany Schwartz 01/13/2016, 1:42 PM

## 2016-01-13 NOTE — Transfer of Care (Signed)
Immediate Anesthesia Transfer of Care Note  Patient: Brittany Schwartz  Procedure(s) Performed: Procedure(s): CYSTOSCOPY WITH RETROGRADE PYELOGRAM/BILATERAL URETERAL STENT PLACEMENT (Bilateral)  Patient Location: PACU and ICU  Anesthesia Type:General  Level of Consciousness: Patient remains intubated per anesthesia plan  Airway & Oxygen Therapy: Patient remains intubated per anesthesia plan  Post-op Assessment: Report given to RN and Post -op Vital signs reviewed and stable  Post vital signs: Reviewed and stable  Last Vitals:  Filed Vitals:   01/13/16 1307 01/13/16 1400  BP: 116/42 128/75  Pulse: 104   Temp: 36.6 C 36.6 C  Resp: 25 25    Complications: No apparent anesthesia complications

## 2016-01-13 NOTE — Progress Notes (Signed)
MEDICAL ONCOLOGY January 13, 2016, 10:04 AM  Hospital day  3 Antibiotics: vanc zosyn Chemotherapy: first adriamycin 01-10-16 as salvage for recurrent endometrial carcinoma. No GCSF yet.   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 Nods affirmative when asked if in pain.  Husband, daughter, another family member at bedside; other daughter spoke with me in hall.   Echocardiogram 01-02-16 with EF 65-70% PAC in Flu vaccine 01-08-16 Bilat LE venous dopplers negative clot 01-08-16  ONCOLOGIC HISTORY Paient presented to Dr [name] 01-03-15 with vaginal spotting x 1 year. Pelvic US showed 33 mm endometrial stripe with ovaries normal, and endometrial biopsy 01-03-15 grade 3 endometrial adenocarcinoma. CT CAP 01-13-15 had negative chest, no adenopathy, mild fatty liver, markedly thickened endometrium and no evidence of metastatic disease outside of uterus. CA 125 preoperatively on 01-13-15 was 22.9. Surgery by Dr [name] 01-19-15 was robotic hysterectomy, BSO and resection of nodule at sigmoid serosa. At surgery there was milial tumor studding with 5-10 mm implants on bladder peritoneum and miliary disease on pelvic sidewalls, without upper abdominal disease evident, and omentum appeared normal. 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 Recommendation for 6 cycles of taxol carboplatin, then consideration of external beam RT + vaginal brachytherapy if CR. First carboplatin taxol was given 02-16-2015; she was neutropenic with ANC 0.7 by day 15 cycle 1. She was transfused 2 units PRBCs 5-19/5-20-16 for  Hgb 7.8. Cycles 4 and 5 were both delayed due to thrombocytopenia. She completed chemotherapy 06-16-15 and vaginal brachytherapy30 gray in 5 fractions as of 09/14/2015. She did well thru end of 11-2015, including at gyn oncology exam 11-20-15, then presented to ED 12-29-15 with ~ 2 weeks progressive abdominal distension and pain, with large volume malignant ascites and large recurrent tumor burden. Cytology [id number] from ascites malignant cells consistent with gyn adenocarcinoma. She was hospitalized thru 01-07-16, with paracentesis for 4 liters bloody fluid on 1-28, for 3.5 liters on 01-02-16 and 4 liters on 01-09-16. Chemo was held 01-08-16 due to concerning exam, with bilateral LE venous dopplers no evidence of DVT and CT confirming large ascites (not appreciated on [place] 01-06-16) and bulky tumor without apparent bleed. Patient and family were counseled on very aggressive and advanced recurrent malignancy, understood that any treatment would be in attempt to control disease but not potential cure. Discussion then about life support and advance directives, specifically that such measures would not improve the underlying malignancy; patient and family wanted to consider that information. She requested attempt at treatment, had first adriamycin on 01-10-16 She was receiving PRBCs when developed respiratory distress, taken to ED and admitted to ICU.  Objective: Vital signs in last 24 hours: Blood pressure 88/52, pulse 93, temperature 96.1 F (35.6 C), temperature source Core (Comment), resp. rate 25, height 5\' 2"  (1.575 m), weight 167 lb 12.3 oz (76.1 kg), SpO2 100 %. Orally intubated. NG in. CVVHD ongoing. PERRL. Cannot visualize any oral lesions. Breath sounds bilaterally in anterior fields, no wheezes or rales. Abdomen tightly distended, quiet. Foley in, a few cc urine in tubing. Feet warm, SCDs on, LE edematous.   Intake/Output from previous day: 02/10 0701 - 02/11 0700 In: 1014.5 [I.V.:810.5; IV  Piggyback:150] Out: 1233 [  Urine:94] Intake/Output this shift: Total I/O In: 334.8 [I.V.:84.8; IV Piggyback:250] Out: 242 [Urine:11; Other:231]    Lab Results:  Recent Labs  01/12/16 0322 01/13/16 0500  WBC 18.2* 16.3*  HGB 11.6* 10.4*  HCT 32.8* 29.7*  PLT 216 111*  ANC 15.6  BMET  Recent Labs  01/12/16 1643 01/13/16 0500  NA 131* 132*  K 5.9* 5.0  CL 98* 100*  CO2 21* 22  GLUCOSE 142* 155*  BUN 52* 34*  CREATININE 2.89* 1.94*  CALCIUM 8.0* 7.3*    Studies/Results: Ct Abdomen Pelvis Wo Contrast  01/11/2016  CLINICAL DATA:  Advanced endometrial cancer with peritoneal spread. Recent transfusion for anemia, subsequently developing shortness of breath. EXAM: CT CHEST, ABDOMEN AND PELVIS WITHOUT CONTRAST TECHNIQUE: Multidetector CT imaging of the chest, abdomen and pelvis was performed following the standard protocol without IV contrast. COMPARISON:  CT abdomen pelvis with contrast 01/08/2016. FINDINGS: CT CHEST FINDINGS Patient has been intubated. Endotracheal tube is near carina and should be withdrawn 3-4 cm. Port-A-Cath tip unchanged proximal RIGHT atrium. New LEFT IJ dual-lumen venous catheter distal SVC. No pneumothorax. Mediastinum/Lymph Nodes: No masses or pathologically enlarged lymph nodes identified on this un-enhanced exam. Lungs/Pleura: BILATERAL pleural effusions, larger on the RIGHT. Suspected LEFT greater than RIGHT lower lobe infiltrates. Perihilar opacities suggesting superimposed pulmonary edema. Musculoskeletal: No chest wall mass or suspicious bone lesions identified. CT ABDOMEN PELVIS FINDINGS Hepatobiliary: No mass visualized on this un-enhanced exam. Pancreas: No mass or inflammatory process identified on this un-enhanced exam. Spleen: Within normal limits in size. Adrenals/Urinary Tract: LEFT hydronephrosis redemonstrated. BILATERAL contrast nephrograms persists from the February 6 scan indicating acute renal failure. Large pelvic mass. Extrinsic mass effect  on the bladder with posterior wall invasion and LEFT UVJ mass effect contributes to hydronephrosis. Stomach/Bowel: No evidence of obstruction, inflammatory process, or abnormal fluid collections. Vascular/Lymphatic: No pathologically enlarged lymph nodes. No evidence of abdominal aortic aneurysm. Reproductive: Status post hysterectomy. Widespread omental tumor. Marked worsening of ascites. Other: None. Musculoskeletal:  No suspicious bone lesions identified. IMPRESSION: Marked worsening aeration since priors. Pulmonary edema with superimposed lower lobe infiltrates, LEFT greater than RIGHT. Endotracheal tube too low.  Withdrawal 3-4 cm. Marked worsening ascites. Pelvic tumor redemonstrated with bladder invasion. Persistent contrast nephrograms with LEFT hydronephrosis. Electronically Signed   By: Staci Righter M.D.   On: 01/11/2016 21:56   Dg Abd 1 View  01/12/2016  CLINICAL DATA:  65 year old female - NG tube advancement. EXAM: ABDOMEN - 1 VIEW COMPARISON:  01/12/2016 FINDINGS: An NG tube is identified with tip overlying the distal stomach. No other significant abnormalities noted. IMPRESSION: NG tube with tip overlying the distal stomach. Electronically Signed   By: Margarette Canada M.D.   On: 01/12/2016 18:32   Ct Chest Wo Contrast  01/11/2016  CLINICAL DATA:  Advanced endometrial cancer with peritoneal spread. Recent transfusion for anemia, subsequently developing shortness of breath. EXAM: CT CHEST, ABDOMEN AND PELVIS WITHOUT CONTRAST TECHNIQUE: Multidetector CT imaging of the chest, abdomen and pelvis was performed following the standard protocol without IV contrast. COMPARISON:  CT abdomen pelvis with contrast 01/08/2016. FINDINGS: CT CHEST FINDINGS Patient has been intubated. Endotracheal tube is near carina and should be withdrawn 3-4 cm. Port-A-Cath tip unchanged proximal RIGHT atrium. New LEFT IJ dual-lumen venous catheter distal SVC. No pneumothorax. Mediastinum/Lymph Nodes: No masses or pathologically  enlarged lymph nodes identified on this un-enhanced exam. Lungs/Pleura: BILATERAL pleural effusions, larger on the RIGHT. Suspected LEFT greater than RIGHT lower lobe infiltrates. Perihilar opacities suggesting superimposed pulmonary  edema. Musculoskeletal: No chest wall mass or suspicious bone lesions identified. CT ABDOMEN PELVIS FINDINGS Hepatobiliary: No mass visualized on this un-enhanced exam. Pancreas: No mass or inflammatory process identified on this un-enhanced exam. Spleen: Within normal limits in size. Adrenals/Urinary Tract: LEFT hydronephrosis redemonstrated. BILATERAL contrast nephrograms persists from the February 6 scan indicating acute renal failure. Large pelvic mass. Extrinsic mass effect on the bladder with posterior wall invasion and LEFT UVJ mass effect contributes to hydronephrosis. Stomach/Bowel: No evidence of obstruction, inflammatory process, or abnormal fluid collections. Vascular/Lymphatic: No pathologically enlarged lymph nodes. No evidence of abdominal aortic aneurysm. Reproductive: Status post hysterectomy. Widespread omental tumor. Marked worsening of ascites. Other: None. Musculoskeletal:  No suspicious bone lesions identified. IMPRESSION: Marked worsening aeration since priors. Pulmonary edema with superimposed lower lobe infiltrates, LEFT greater than RIGHT. Endotracheal tube too low.  Withdrawal 3-4 cm. Marked worsening ascites. Pelvic tumor redemonstrated with bladder invasion. Persistent contrast nephrograms with LEFT hydronephrosis. Electronically Signed   By: Staci Righter M.D.   On: 01/11/2016 21:56   Dg Chest Port 1 View  01/13/2016  CLINICAL DATA:  Hypoxia EXAM: PORTABLE CHEST 1 VIEW COMPARISON:  January 12, 2016 FINDINGS: Endotracheal tube tip is 3.3 cm above the carina. Dual-lumen central catheter tip in superior vena cava. Port-A-Cath tip in superior vena cava. Nasogastric tube tip and side port in stomach. No pneumothorax. There is slight atelectasis in the lung  bases. Lungs elsewhere clear. Heart size and pulmonary vascularity are normal. No adenopathy. There is degenerative change in the thoracic spine. IMPRESSION: Tube and catheter positions as described without pneumothorax. Lungs are clear except for slight bibasilar atelectasis. Cardiac silhouette within normal limits. Electronically Signed   By: Lowella Grip III M.D.   On: 01/13/2016 07:28   Dg Chest Port 1 View  01/12/2016  CLINICAL DATA:  65 year old female with respiratory failure. Metastatic endometrial cancer. Shortness of breath, intubated. Initial encounter. EXAM: PORTABLE CHEST 1 VIEW COMPARISON:  CT chest abdomen and pelvis 01/11/2016 and earlier. FINDINGS: Portable AP semi upright view at 0454 hours. Endotracheal tube tip now projects in good position just below the clavicles. Right chest porta cath remains in place. Left IJ approach dual lumen dialysis type catheter at the lower SVC level. Normal cardiac size and mediastinal contours. No pneumothorax or pulmonary edema. Confluent bilateral lower lobe consolidation is less apparent than on yesterday CT. Small right pleural effusion on that exam also is not evident today. No areas of worsening ventilation. IMPRESSION: 1. Endotracheal tube tip in good position at the level the clavicles. 2.  Otherwise, stable lines and tubes. 3. No new cardiopulmonary finding. Bilateral lower lobe consolidation compatible with pneumonia. Small right pleural effusion seen yesterday not evident radiographically. Electronically Signed   By: Genevie Ann M.D.   On: 01/12/2016 06:57   Dg Chest Port 1 View  01/11/2016  CLINICAL DATA:  Status post intubation and central line placement EXAM: PORTABLE CHEST 1 VIEW COMPARISON:  January 11, 2016 FINDINGS: Endotracheal tube is identified with distal tip 1.4 cm from carina. Retraction by 2.5 cm is recommended. Dual lumen left jugular central venous line are identified with distal tip in the superior vena cava. Right central venous  line is identified with distal tip in the superior vena cava unchanged. There is pulmonary edema. There is no pleural effusion or focal pneumonia. There is no pneumothorax. The osseous structures are stable. IMPRESSION: Endotracheal tube distal tip 1.5 cm from carina. Retraction by 2.5 cm is recommended. Left central venous line distal  tips in the superior vena cava. No pneumothorax. Pulmonary edema. These results will be called to the ordering clinician or representative by the Radiologist Assistant, and communication documented in the PACS or zVision Dashboard. Electronically Signed   By: Abelardo Diesel M.D.   On: 01/11/2016 21:13   Dg Chest Portable 1 View  01/11/2016  CLINICAL DATA:  History of uterine cancer with abdominal ascites. Patient became hypertensive and diaphoretic during blood transfusion. EXAM: PORTABLE CHEST 1 VIEW COMPARISON:  CT of the chest 01/13/2015 FINDINGS: Right internal jugular approach injectable port terminates within the expected location of superior vena cava. Cardiomediastinal silhouette is normal. Mediastinal contours appear intact. There is no evidence of pneumothorax. Lung volumes are low. There is bilateral interstitial thickening which may be seen with pulmonary vascular congestion. More confluent left lower lobe airspace consolidation cannot be excluded. Osseous structures are without acute abnormality. Soft tissues are grossly normal. IMPRESSION: Low lung volumes with bilateral interstitial thickening, likely representing pulmonary vascular congestion. More confluent left lower lobe airspace consolidation cannot be excluded. Electronically Signed   By: Fidela Salisbury M.D.   On: 01/11/2016 16:51   Dg Abd Portable 1v  01/12/2016  CLINICAL DATA:  NG tube placement. EXAM: PORTABLE ABDOMEN - 1 VIEW COMPARISON:  CT of the abdomen pelvis 01/10/2014 FINDINGS: Enteric catheter tip is seen overlying the mid upper abdomen, location is uncertain as no gastric bubble is seen,  however it overlies the expected location of the proximal stomach. There is nonobstructive bowel gas pattern. Hyperdense renal shadows are seen. Bridging osteophytes of the lumbosacral spine are noted. IMPRESSION: Enteric catheter overlies expected location of the proximal stomach. Its location is uncertain as no gastric bubble is visualized. Nonobstructive bowel gas pattern. Hyperdense appearance of the renal shadows may represent retention of contrast from CT performed 15 hours ago, indicative of renal function impairment. Electronically Signed   By: Fidela Salisbury M.D.   On: 01/12/2016 11:48     Assessment/Plan: 1.High grade endometrial carcinoma: rapidly recurrent in past ~ 3 weeks with large volume malignant ascites and extensive, bulky metastatic disease in abdomen and pelvis. Received first adriamycin on 01-10-16, in attempt to improve/ control the disease, however this is not situation of potential cure. Would not expect tumor lysis with this tumor type or chemo, doubt tumor lysis etiology for elevated K. Platelet count beginning to drop, WBC and ANC still ok, not yet at nadir from the first adriamycin. Expect should begin granix tomorrow, which could increase WBC initially. I have told family that benefit from this chemo likely will not be apparent for another 1-2 weeks at best and will have to get thru nadir counts also. Ascites is malignant. Likely will need paracentesis again in near future given tight distension now, tho BP noted. At time of next paracentesis, if possible SUGGEST PERITONEAL CATHETER placement to allow more frequent small volume drainage.   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  2.Acute hypoxic respiratory failure 01-11-16: intubated, critical care managing. Large ascites was also restricting  breathing prior to intubation. 3.acute renal failure: on CVVHD, K better. Left hydroureteronephrosis from pelvic tumor obstruction, and family, especially husband, has requested stenting, planned later today.  4.anemia with bloody malignant ascites 5.Diabetes on oral agents PTA 6.Severe protein calorie malnutrition. TNA would not help underlying malignancy, but may tolerate feedings per GI tract 7. Hyponatremia and electrolyte  abnormalities including marked hyperkalemia on admission.  8.extensive left flank bruising but no retroperitoneal bleeding by scan 9.no LE DVTs by doppler 01-09-16 10.flu vaccine done early this week 11.Limited code more appropriate than full code in situation of underlying advanced malignancy. If worsens, DNR and comfort care would also be appropriate given underlying situation. Family is very appreciative of care by CCM, nephrology, urology and do seem to understand gravity of situation.  Note nephew is pharmacist in [employer] and one daughter is Secondary school teacher in [place].  WIll follow. Please page if I can assist between rounds   [phone] Edie Vallandingham P

## 2016-01-14 DIAGNOSIS — D696 Thrombocytopenia, unspecified: Secondary | ICD-10-CM

## 2016-01-14 LAB — RENAL FUNCTION PANEL
ALBUMIN: 1.6 g/dL — AB (ref 3.5–5.0)
ANION GAP: 9 (ref 5–15)
ANION GAP: 11 (ref 5–15)
Albumin: 1.5 g/dL — ABNORMAL LOW (ref 3.5–5.0)
BUN: 22 mg/dL — ABNORMAL HIGH (ref 6–20)
BUN: 20 mg/dL (ref 6–20)
CALCIUM: 7.5 mg/dL — AB (ref 8.9–10.3)
CHLORIDE: 98 mmol/L — AB (ref 101–111)
CHLORIDE: 100 mmol/L — AB (ref 101–111)
CO2: 26 mmol/L (ref 22–32)
CO2: 24 mmol/L (ref 22–32)
Calcium: 7.7 mg/dL — ABNORMAL LOW (ref 8.9–10.3)
Creatinine, Ser: 1.29 mg/dL — ABNORMAL HIGH (ref 0.44–1.00)
Creatinine, Ser: 1.14 mg/dL — ABNORMAL HIGH (ref 0.44–1.00)
GFR calc Af Amer: 49 mL/min — ABNORMAL LOW (ref >60)
GFR calc Af Amer: 57 mL/min — ABNORMAL LOW (ref >60)
GFR calc non Af Amer: 42 mL/min — ABNORMAL LOW (ref >60)
GFR, EST NON AFRICAN AMERICAN: 49 mL/min — AB (ref >60)
GLUCOSE: 192 mg/dL — AB (ref 65–99)
Glucose, Bld: 190 mg/dL — ABNORMAL HIGH (ref 65–99)
PHOSPHORUS: 3.5 mg/dL (ref 2.5–4.6)
POTASSIUM: 4 mmol/L (ref 3.5–5.1)
Phosphorus: 3.8 mg/dL (ref 2.5–4.6)
Potassium: 4.3 mmol/L (ref 3.5–5.1)
SODIUM: 133 mmol/L — AB (ref 135–145)
Sodium: 135 mmol/L (ref 135–145)

## 2016-01-14 LAB — CBC WITH DIFFERENTIAL/PLATELET
Basophils Absolute: 0 10*3/uL (ref 0.0–0.1)
Basophils Relative: 0 %
EOS ABS: 0 10*3/uL (ref 0.0–0.7)
Eosinophils Relative: 0 %
HEMATOCRIT: 25 % — AB (ref 36.0–46.0)
HEMOGLOBIN: 8.8 g/dL — AB (ref 12.0–15.0)
LYMPHS ABS: 0.3 10*3/uL — AB (ref 0.7–4.0)
LYMPHS PCT: 2 %
MCH: 30.8 pg (ref 26.0–34.0)
MCHC: 35.2 g/dL (ref 30.0–36.0)
MCV: 87.4 fL (ref 78.0–100.0)
MONOS PCT: 0 %
Monocytes Absolute: 0 10*3/uL — ABNORMAL LOW (ref 0.1–1.0)
NEUTROS PCT: 98 %
Neutro Abs: 15.6 10*3/uL — ABNORMAL HIGH (ref 1.7–7.7)
Platelets: 89 10*3/uL — ABNORMAL LOW (ref 150–400)
RBC: 2.86 MIL/uL — ABNORMAL LOW (ref 3.87–5.11)
RDW: 13.7 % (ref 11.5–15.5)
WBC: 16 10*3/uL — ABNORMAL HIGH (ref 4.0–10.5)

## 2016-01-14 LAB — CALCIUM, IONIZED: Calcium, Ionized, Serum: 4.4 mg/dL — ABNORMAL LOW (ref 4.5–5.6)

## 2016-01-14 LAB — GLUCOSE, CAPILLARY
GLUCOSE-CAPILLARY: 149 mg/dL — AB (ref 65–99)
GLUCOSE-CAPILLARY: 156 mg/dL — AB (ref 65–99)
GLUCOSE-CAPILLARY: 169 mg/dL — AB (ref 65–99)
GLUCOSE-CAPILLARY: 175 mg/dL — AB (ref 65–99)
Glucose-Capillary: 155 mg/dL — ABNORMAL HIGH (ref 65–99)
Glucose-Capillary: 117 mg/dL — ABNORMAL HIGH (ref 65–99)
Glucose-Capillary: 220 mg/dL — ABNORMAL HIGH (ref 65–99)

## 2016-01-14 LAB — MAGNESIUM: Magnesium: 2.4 mg/dL (ref 1.7–2.4)

## 2016-01-14 LAB — VANCOMYCIN, RANDOM: Vancomycin Rm: 11 ug/mL

## 2016-01-14 MED ORDER — CETYLPYRIDINIUM CHLORIDE 0.05 % MT LIQD
7.0000 mL | Freq: Two times a day (BID) | OROMUCOSAL | Status: DC
  Administered 2016-01-15 – 2016-01-30 (×27): 7 mL via OROMUCOSAL

## 2016-01-14 MED ORDER — LATANOPROST 0.005 % OP SOLN
1.0000 [drp] | Freq: Every day | OPHTHALMIC | Status: DC
  Administered 2016-01-14 – 2016-01-29 (×16): 1 [drp] via OPHTHALMIC
  Filled 2016-01-14: qty 2.5

## 2016-01-14 MED ORDER — VANCOMYCIN HCL IN DEXTROSE 1-5 GM/200ML-% IV SOLN
1000.0000 mg | INTRAVENOUS | Status: DC
  Administered 2016-01-14 – 2016-01-21 (×8): 1000 mg via INTRAVENOUS
  Filled 2016-01-14 (×8): qty 200

## 2016-01-14 MED ORDER — TIMOLOL MALEATE 0.5 % OP SOLN
1.0000 [drp] | Freq: Two times a day (BID) | OPHTHALMIC | Status: DC
  Administered 2016-01-14 – 2016-01-30 (×32): 1 [drp] via OPHTHALMIC
  Filled 2016-01-14: qty 5

## 2016-01-14 NOTE — Progress Notes (Signed)
1 Day Post-Op  Subjective:  1 - Left Malignant Hydronephrosis - New mild left hydro by CT this admission on eval GYN malignancy. Dilation to level of deep pelvis at level of large recurrent GYN mass. S/p Bilateral JJ ureteral stent placement on 01/13/16, LEFT ureter had area concerning for obstruction near the level of the mass.  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 All services including oncology agree guarded prognosis   3 - Acute Renal Failure - Cr high 3s with hyperkalemia 01/10/16, now on CRRT. Had contrast CT 2/6, repeat non-con CT 2/9 with persistant nephrogram. Baseline Cr >1 1 mo ago and approx 1.5 on admit. Mild mod-left hydro with delayed contrast excretion on imaging 01/10/15. Now s/p bilateral stents with creatinine down-trending to 1.29 but UOP remains poor at <100cc per 24 hrs.  Today Ms Savarese is stable. Some scant UOP overnight (41mL). Pain controlled.   Objective: Vital signs in last 24 hours: Temp:  [95.7 F (35.4 C)-98.1 F (36.7 C)] 97.3 F (36.3 C) (02/12 0400) Pulse Rate:  [93-107] 96 (02/12 0700) Resp:  [12-27] 25 (02/12 0700) BP: (88-138)/(32-85) 128/78 mmHg (02/12 0700) SpO2:  [97 %-100 %] 100 % (02/12 0700) FiO2 (%):  [30 %] 30 % (02/12 0419) Weight:  [73.6 kg (162 lb 4.1 oz)] 73.6 kg (162 lb 4.1 oz) (02/12 0500) Last BM Date:  (PTA)  Intake/Output from previous day: 02/11 0701 - 02/12 0700 In: 1883.5 [I.V.:1433.5; IV Piggyback:450] Out: [id number] [Urine:102] Intake/Output this shift:    General appearance: alert and mild sedation. Husband and daughters at bedside Eyes: negative Throat: lips, mucosa, and tongue normal; teeth and gums normal and ETT in place on vent Neck: supple, symmetrical, trachea midline and [place] central line on CRRT Cardio: regular mid tachycardia by bedside monitor GI: distended abd c/w known  malignant ascites Extremities: extremities normal, atraumatic, no cyanosis or edema Lymph nodes: Cervical, supraclavicular, and axillary nodes normal. Neurologic: Mental status: alert answers yes no questions promptly Foley c/d/i with scant yellow urine.   Lab Results:   Recent Labs  01/13/16 0500 01/14/16 0630  WBC 16.3* 16.0*  HGB 10.4* 8.8*  HCT 29.7* 25.0*  PLT 111* 89*   BMET  Recent Labs  01/13/16 1538 01/14/16 0630  NA 131* 133*  K 4.7 4.3  CL 98* 98*  CO2 24 26  GLUCOSE 146* 192*  BUN 29* 22*  CREATININE 1.83* 1.29*  CALCIUM 7.6* 7.5*   PT/INR No results for input(s): LABPROT, INR in the last 72 hours. ABG  Recent Labs  01/12/16 0020 01/12/16 0353  PHART 7.294* 7.400  HCO3 15.6* 16.6*    Studies/Results: Dg Abd 1 View  01/12/2016  CLINICAL DATA:  65 year old female - NG tube advancement. EXAM: ABDOMEN - 1 VIEW COMPARISON:  01/12/2016 FINDINGS: An NG tube is identified with tip overlying the distal stomach. No other significant abnormalities noted. IMPRESSION: NG tube with tip overlying the distal stomach. Electronically Signed   By: Margarette Canada M.D.   On: 01/12/2016 18:32   Dg Chest Port 1 View  01/13/2016  CLINICAL DATA:  Hypoxia EXAM: PORTABLE CHEST 1 VIEW COMPARISON:  January 12, 2016 FINDINGS: Endotracheal tube tip is 3.3 cm above the carina. Dual-lumen central catheter tip in superior vena cava. Port-A-Cath tip in superior vena cava. Nasogastric tube tip and side port in stomach. No pneumothorax. There is slight atelectasis in the lung  bases. Lungs elsewhere clear. Heart size and pulmonary vascularity are normal. No adenopathy. There is degenerative change in the thoracic spine. IMPRESSION: Tube and catheter positions as described without pneumothorax. Lungs are clear except for slight bibasilar atelectasis. Cardiac silhouette within normal limits. Electronically Signed   By: Lowella Grip III M.D.   On: 01/13/2016 07:28   Dg Abd Portable  1v  01/12/2016  CLINICAL DATA:  NG tube placement. EXAM: PORTABLE ABDOMEN - 1 VIEW COMPARISON:  CT of the abdomen pelvis 01/10/2014 FINDINGS: Enteric catheter tip is seen overlying the mid upper abdomen, location is uncertain as no gastric bubble is seen, however it overlies the expected location of the proximal stomach. There is nonobstructive bowel gas pattern. Hyperdense renal shadows are seen. Bridging osteophytes of the lumbosacral spine are noted. IMPRESSION: Enteric catheter overlies expected location of the proximal stomach. Its location is uncertain as no gastric bubble is visualized. Nonobstructive bowel gas pattern. Hyperdense appearance of the renal shadows may represent retention of contrast from CT performed 15 hours ago, indicative of renal function impairment. Electronically Signed   By: Fidela Salisbury M.D.   On: 01/12/2016 11:48    Anti-infectives: Anti-infectives    Start     Dose/Rate Route Frequency Ordered Stop   01/13/16 0800  vancomycin (VANCOCIN) IVPB 1000 mg/200 mL premix  Status:  Discontinued     1,000 mg 200 mL/hr over 60 Minutes Intravenous Every 24 hours 01/12/16 1305 01/13/16 1202   01/12/16 1200  piperacillin-tazobactam (ZOSYN) IVPB 3.375 g     3.375 g 100 mL/hr over 30 Minutes Intravenous 4 times per day 01/12/16 1040     01/11/16 2200  piperacillin-tazobactam (ZOSYN) IVPB 2.25 g  Status:  Discontinued     2.25 g 100 mL/hr over 30 Minutes Intravenous Every 8 hours 01/11/16 2136 01/12/16 1038   01/11/16 2145  vancomycin (VANCOCIN) IVPB 1000 mg/200 mL premix     1,000 mg 200 mL/hr over 60 Minutes Intravenous NOW 01/11/16 2136 01/11/16 2357      Assessment/Plan:  1 - Left Malignant Hydronephrosis -s/p bilateral JJ ureteral stents (6Fr x 26 cm JJ). These can remain in place up to 3-6 months prior to needing exchange. Recommend continuing foley catheter with stents to optimize improvement in ARF, once creatinine has stabilized can remove foley from urology  standpoint.   2 - Metastatic Serous Cystadenocarcinoma - guarded prognosis, but pt and family still desire maximally aggressive course.   3 - Acute Renal Failure - hopefully there will be some meaningful recovery with time, hydration, CRRT, and relief of obstruction for likley multifactorial etiology (contrast nephropathy, poor perfusion state, obstruction). Improving s/p stent placement and CRRT.  4 - Will follow prn while in house at this point, please call me directly with questions at anytime.

## 2016-01-14 NOTE — Progress Notes (Signed)
[facility] Progress Note Patient Name: Brittain Anstey DOB: 21-Jul-1951 MRN: [id number]   Date of Service  01/14/2016  HPI/Events of Note    eICU Interventions  Resumed home eye meds     Intervention Category Minor Interventions: Routine modifications to care plan (e.g. PRN medications for pain, fever)  Dimas Chyle 01/14/2016, 7:20 PM

## 2016-01-14 NOTE — Progress Notes (Signed)
Pharmacy Antibiotic Note  Patient is a 65 y.o F with hx of uterine cancer now with rapid recurrent disease with large volume malignant ascites and bulky involvement in pelvis and abdominal caking, last chemo on 2/8. Pt presents to ED after becoming diaphoretic and hypertensive while receiving blood transfusion.  Pt admitted with multiple electrolyte derangements and acute hypoxic respiratory failure requiring intubation, started on CRRT and empiric antibiotics.   Today, 01/14/2016  Pt on D3 CRRT, remains with poor UOP  S/p bilateral JJ ureteral stents 2/11  WBC slightly improved  Afebrile  Antibiotics adjusted for CRRT  Random vancomycin level = 11 (goal 15-20)  Plan: Continue Vancomycin 1g IV q24h Continue Zosyn 3.375g IV q6h F/u random vanc levels to adjust dosing F/u cultures, clinical course, CRRT  Height: 5\' 2"  (157.5 cm) Weight: 162 lb 4.1 oz (73.6 kg) IBW/kg (Calculated) : 50.1  Temp (24hrs), Avg:97.5 F (36.4 C), Min:96.8 F (36 C), Max:98.1 F (36.7 C)   Recent Labs Lab 01/10/16 1323  01/11/16 1652 01/11/16 2109  01/12/16 0322 01/12/16 0756 01/12/16 1643 01/13/16 0500 01/13/16 1538 01/14/16 0630  WBC 15.2*  --  20.2*  --   --  18.2*  --   --  16.3*  --  16.0*  CREATININE  --   < > 3.61* 3.56*  < > 3.64* 3.13* 2.89* 1.94* 1.83* 1.29*  LATICACIDVEN  --   --   --  3.0*  --   --   --   --   --   --   --   VANCORANDOM  --   --   --   --   --   --   --   --   --   --  11  < > = values in this interval not displayed.  Estimated Creatinine Clearance: 40.8 mL/min (by C-G formula based on Cr of 1.29).    No Known Allergies  Antimicrobials this admission: 2/9 Vancomycin>> 2/9  Zosyn>>   Dose adjustments this admission: 2/12 RVL = 11 after Vanc 1g 2/9, 2/11  Microbiology results: 2/10 MRSA PCR neg 2/9 urine: 8K insignificant growth  2/9 blood: NGTD  Thank you for allowing pharmacy to be a part of this patient's care.  Ralene Bathe, PharmD,  BCPS 01/14/2016, 9:52 AM  Pager: [phone]

## 2016-01-14 NOTE — Progress Notes (Signed)
Per Dr. Ashok Cordia, as long as patient tolerates clears the NG tube can be removed. Pt tolerated clears well and NG tube removed at 1710.

## 2016-01-14 NOTE — Progress Notes (Addendum)
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  Now with rapid recurrent disease since mid January with large volume malignant ascites, bulky involvement pelvis and abdominal caking.  She is followed by Dr. Marko Schwartz with oncology and is currently undergoing chemo.  Family reports that oncologist feels as though there is hope for improvement with chemotherapy. She received last dose of chemotherapy 2/8. She had 2u PRBC transfusion 2/9 for symptomatic anemia.  During transfusion, she complained of SOB with diaphoresis.  She was subsequently sent to ED for further evaluation.  In ED, she was hypertensive and remained SOB.  She was therefore placed on BiPAP. CXR showed significant pulmonary edema. Laboratory assessment was significant for renal failure with K 7.2, Na 119 (chronically low), and SCr 3.61. She is also hyperglycemic with Glucose 369. Lab eval also notable for WBC 20.2. CT abdomen from a few days prior consistent with progressive mass size with compression of L ureter and L hydronephrosis. She was unable to tolerate BiPAP in ED and required intubation.  PCCM asked to admit.  Extensive discussion with family by Dr. Titus Schwartz.  Given Brittany Schwartz's hx and current circumstances, decision made to limit code status to intubation and vasopressors only.  In the event of cardiac arrest, we would not put her through CPR or defibrillation.  Full medical management otherwise.  STUDIES:  TTE 1/31:  LV EF 65-70%. Normal wall motion. Grade 1 diastolic dysfunction. LA & RA normal in size. RV normal in size & function. PASP 21 mmHg. Normal aortic & mitral  valves. No pericardial effusion. Port CXR 2/11: No opacity. ETT in good position. R IJ CVL in good position.   MICROBIOLOGY: Blood Ctx x2 2/9>> Urine Ctx 2/9>> Blood Ctx x2 2/3:  Negative  Urine Ctx 2/2:  Negative   ANTIBIOTICS: Vanc 02/09 > Zosyn 02/09 >  SIGNIFICANT EVENTS: 02/09 > admitted with acute hypoxic respiratory failure, multiple electrolyte derangements. 02/10 > started CVVHD 02/11 > ureteral stent placed  LINES/TUBES: OETT 7.5 02/09 > R IJ HD cath 02/09 > Foley 2/9 > OGT 2/9 > R Chest [facility] 1/31 >  SUBJECTIVE: No acute events overnight. Remains on CVVHD. Had stent placed for ureteral obstruction by mass yesterday. Patient denies any difficulty breathing or pain with a head nod.  REVIEW OF SYSTEMS:  Unobtainable as patient is intubated.  VITAL SIGNS: BP 116/67 mmHg  Pulse 99  Temp(Src) 97.3 F (36.3 C) (Oral)  Resp 26  Ht 5\' 2"  (1.575 m)  Wt 76.1 kg (167 lb 12.3 oz)  BMI 30.68 kg/m2  SpO2 100%  HEMODYNAMICS:    VENTILATOR SETTINGS: Vent Mode:  [-] PRVC FiO2 (%):  [30 %] 30 % Set Rate:  [25 bmp] 25 bmp Vt Set:  [410 mL] 410 mL PEEP:  [5 cmH20] 5 cmH20 Plateau Pressure:  [24 cmH20-29 cmH20] 24 cmH20  INTAKE / OUTPUT: I/O last 3 completed shifts: In: 2259.2 [I.V.:1705.2; Other:54; IV [id number] Out: 2359 [Urine:131; Other:2228]  PHYSICAL EXAMINATION: General:  Awake. Daughter again at bedside. No acute distress.  Integument:  Warm & dry. No rash on exposed skin.  HEENT:  No scleral injection. Endotracheal tube in place.  Cardiovascular:  Regular rhythm. Slightly tachycardic. Normal S1 & S2. No appreciable JVD.  Pulmonary:  Improving aeration in the lung bases. Symmetric chest wall rise on ventilator. Cuff leak present. Abdomen: Soft. Normal bowel sounds. Protuberant. Soft amount of circumferential ascites present on bedside abdominal ultrasound. Neurological:  Nods to questions. Follows commands. Attends to voice.  LABS:  BMET  Recent  Labs Lab 01/12/16 1643 01/13/16 0500 01/13/16 1538  NA 131* 132* 131*  K 5.9* 5.0 4.7  CL 98* 100* 98*  CO2 21* 22 24  BUN 52* 34* 29*  CREATININE 2.89* 1.94* 1.83*  GLUCOSE 142* 155* 146*    Electrolytes  Recent Labs Lab 01/12/16 0322 01/12/16 0756 01/12/16 1643 01/13/16 0500 01/13/16 1538  CALCIUM 7.9* 8.1* 8.0* 7.3* 7.6*  MG 1.9 2.1  --  2.2  --   PHOS 5.9* 4.6 5.2* 4.4 4.5    CBC  Recent Labs Lab 01/11/16 1652 01/12/16 0322 01/13/16 0500  WBC 20.2* 18.2* 16.3*  HGB 12.9 11.6* 10.4*  HCT 37.6 32.8* 29.7*  PLT 257 216 111*    Coag's No results for input(s): APTT, INR in the last 168 hours.  Sepsis Markers  Recent Labs Lab 01/11/16 2109  LATICACIDVEN 3.0*    ABG  Recent Labs Lab 01/11/16 2201 01/12/16 0020 01/12/16 0353  PHART 7.201* 7.294* 7.400  PCO2ART 34.1* 33.2* 26.5*  PO2ART 391* 220* 152*    Liver Enzymes  Recent Labs Lab 01/08/16 1132 01/11/16 1652  01/12/16 1643 01/13/16 0500 01/13/16 1538  AST 16 25  --   --   --   --   ALT 11 14  --   --   --   --   ALKPHOS 73 93  --   --   --   --   BILITOT 0.71 0.9  --   --   --   --   ALBUMIN 1.7* 2.1*  < > 1.8* 1.6* 1.7*  < > = values in this interval not displayed.  Cardiac Enzymes  Recent Labs Lab 01/11/16 2205  TROPONINI 0.26*    Glucose  Recent Labs Lab 01/13/16 0807 01/13/16 1213 01/13/16 1524 01/13/16 2018 01/13/16 2349 01/14/16 0349  GLUCAP 161* 133* 130* 149* 156* 169*    Imaging No results found.  ASSESSMENT / PLAN:  PULMONARY A: Acute Hypoxic Respiratory Failure - Likely transfusion related edema vs TRALI.  P:   Extubation today Albuterol neb PRN Incentive spirometry for pulmonary toilette Wean FiO2 for saturation greater than 92%  HEMATOLOGIC / ONCOLOGIC A:   Grade 3 serous/ endometrioid endometrial carcinoma IIIA - currently undergoing chemo under the care of Dr. Marko Schwartz. Thrombcytopenia - Worsening.  P:  Oncology  following SCD Heparin Brittany Schwartz q8hr but may need to stop tomorrow depending on platelet count Trending Cell Counts daily w/ CBC   CARDIOVASCULAR A:  Shock  - Resolved. Septic vs Sedation. Elevated Troponin I H/O HTN  P:  Monitor on telemetry Repeat TTE pending  RENAL A:   Acute Renal Failure - Oliguria persists. On CVVHD. Hyperkalemia - Resolved. Metabolic Acidosis - Improving. Hyponatremia - Improving. Left Hydronephrosis - Secondary to mass. S/P Stent by Urology 2/11. Pseudohypocalcemia  P:   Nephrology following CVVHD per recommendations Trending electrolytes & BUN/Creatinine for renal function Monitoring UOP with foley  GASTROINTESTINAL A:   Malignant ascites.  P:   NPO Protonix IV daily Not sufficient fluid for paracentesis  INFECTIOUS A:   Possible Sepsis w/ Shock - Shock has resolved.  P:   Empiric  Vancomycin & Zosyn Day #4 Awaiting Cultures PCT not useful given immunocompromised state.  ENDOCRINE A:   H/O DM - BG controlled. Possible Adrenal Insufficiency  P:   SSI per algorithm Accu-Checks q4hr Hydrocortisone q6hr for stress dose steroids  NEUROLOGIC A:   Acute Encephalopathy - Likely toxic metabolic. Resolved. Pain control  P:   Fentanyl IV when necessary Discontinue Versed & Demerol Hold outpatient oxycodone, gabapentin, lorazepam, compazine.  Family Updates: Daughter updated at bedside by Dr. Ashok Cordia 2/12.  TODAY'S SUMMARY: Dismal prognosis overall. Plan for extubation this morning. Patient's renal function returned remains in question. Family updated at bedside.  I have spent a total of 39 minutes of critical care time today caring for the patient, reviewing the patients electronic medical record, planning the patient's extubation, and discussing the plan of care with family at bedside.  Sonia Baller Ashok Cordia, M.D. [facility] Pulmonary & Critical Care Pager:  [phone] After 3pm or if no response, call [phone] 01/14/2016, 5:20 AM

## 2016-01-14 NOTE — Progress Notes (Signed)
Medical Oncology January 14, 2016, 12:10 PM  Hospital day 4 Antibiotics: vanc zosyn Chemotherapy: first adriamycin 01-10-16 as salvage for recurrent endometrial carcinoma.  (adjuvant chemotherapy for IIIA high grade serous endometrial carcinoma completed 06-16-15)  Outpatient Physicians: [name]/ [name]; [name], MD (PCP, [facility]), Naoma Diener, [facility]  EMR reviewed, talked with 2 daughters and nephew on unit, discussed with CCM  Bilateral ureteral stents placed on 01-13-16 by Dr Tresa Moore Extubated, no present breathing difficulty.  Continuous dialysis, no additional heparin with this Platelets dropping now day 5 cycle 1 adriamycin; granix begun today   Subjective: Rouses easily to voice, alert and appropriate verbal responses. Denies pain, difficulty breathing, sore mouth, nausea..      Echocardiogram 01-02-16 with EF 65-70% PAC in Flu vaccine 01-08-16 Bilat LE venous dopplers negative clot 01-08-16  ONCOLOGIC HISTORY Paient presented to Dr [name] 01-03-15 with vaginal spotting x 1 year. Pelvic US showed 33 mm endometrial stripe with ovaries normal, and endometrial biopsy 01-03-15 grade 3 endometrial adenocarcinoma. CT CAP 01-13-15 had negative chest, no adenopathy, mild fatty liver, markedly thickened endometrium and no evidence of metastatic disease outside of uterus. CA 125 preoperatively on 01-13-15 was 22.9. Surgery by Dr [name] 01-19-15 was robotic hysterectomy, BSO and resection of nodule at sigmoid serosa. At surgery there was milial tumor studding with 5-10 mm implants on bladder peritoneum and miliary disease on pelvic sidewalls, without upper abdominal disease evident, and omentum appeared normal. 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 Recommendation for 6 cycles of taxol carboplatin, then consideration  of external beam RT + vaginal brachytherapy if CR. First carboplatin taxol was given 02-16-2015; she was neutropenic with ANC 0.7 by day 15 cycle 1. She was transfused 2 units PRBCs 5-19/5-20-16 for Hgb 7.8. Cycles 4 and 5 were both delayed due to thrombocytopenia. She completed chemotherapy 06-16-15 and vaginal brachytherapy30 gray in 5 fractions as of 09/14/2015. She did well thru end of 11-2015, including at gyn oncology exam 11-20-15, then presented to ED 12-29-15 with ~ 2 weeks progressive abdominal distension and pain, with large volume malignant ascites and large recurrent tumor burden. Cytology [id number] from ascites malignant cells consistent with gyn adenocarcinoma. She was hospitalized thru 01-07-16, with paracentesis for 4 liters bloody fluid on 1-28, for 3.5 liters on 01-02-16 and 4 liters on 01-09-16. Chemo was held 01-08-16 due to concerning exam, with bilateral LE venous dopplers no evidence of DVT and CT confirming large ascites (not appreciated on [place] 01-06-16) and bulky tumor without apparent bleed. Patient and family were counseled on very aggressive and advanced recurrent malignancy, understood that any treatment would be in attempt to control disease but not potential cure. Discussion then about life support and advance directives, specifically that such measures would not improve the underlying malignancy; patient and family wanted to consider that information. She requested attempt at treatment, had first adriamycin on 01-10-16 She was receiving PRBCs on 01-11-16 when developed respiratory distress, intubated, and has been supported in ICU with continuous dialysis and pressors.   Objective: Vital signs in last 24 hours: Blood pressure 162/74, pulse 108, temperature 97.2 F (36.2 C), temperature source Oral, resp. rate 17, height 5\' 2"  (1.575 m), weight 162 lb 4.1 oz (73.6 kg), SpO2 100 %. Drowses asleep when quiet, does not appear in pain, respirations not labored. NG in. Oral mucosa moist and  clear, PERRL, no JVD at 30  degrees.. Lungs clear anteriorly. Heart RRR no gallop, tachy. Abdomen tightly distended tho not moreso than at my exam yesterday, no BS heard. Foley with few cc in tubing. LE 1-2+ edema to thighs, no clear cords. SCDs on.  Dialysis catheter, PAC. No bleeding at any lines. Slight resting tremors of bilateral UE. Hands/ fingers mildly swollen bilaterally. Moves all extremities.  Intake/Output from previous day: 02/11 0701 - 02/12 0700 In: 1883.5 [I.V.:1433.5; IV Piggyback:450] Out: [id number] [Urine:102] Intake/Output this shift: Total I/O In: 342 [I.V.:72; Other:20; IV Piggyback:250] Out: 317 [Urine:26; Emesis/NG output:50; Other:241]    Lab Results:  Recent Labs  01/13/16 0500 01/14/16 0630  WBC 16.3* 16.0*  HGB 10.4* 8.8*  HCT 29.7* 25.0*  PLT 111* 89*   BMET  Recent Labs  01/13/16 1538 01/14/16 0630  NA 131* 133*  K 4.7 4.3  CL 98* 98*  CO2 24 26  GLUCOSE 146* 192*  BUN 29* 22*  CREATININE 1.83* 1.29*  CALCIUM 7.6* 7.5*    Studies/Results: Dg Abd 1 View  01/12/2016  CLINICAL DATA:  65 year old female - NG tube advancement. EXAM: ABDOMEN - 1 VIEW COMPARISON:  01/12/2016 FINDINGS: An NG tube is identified with tip overlying the distal stomach. No other significant abnormalities noted. IMPRESSION: NG tube with tip overlying the distal stomach. Electronically Signed   By: Margarette Canada M.D.   On: 01/12/2016 18:32   Dg Chest Port 1 View  01/13/2016  CLINICAL DATA:  Hypoxia EXAM: PORTABLE CHEST 1 VIEW COMPARISON:  January 12, 2016 FINDINGS: Endotracheal tube tip is 3.3 cm above the carina. Dual-lumen central catheter tip in superior vena cava. Port-A-Cath tip in superior vena cava. Nasogastric tube tip and side port in stomach. No pneumothorax. There is slight atelectasis in the lung bases. Lungs elsewhere clear. Heart size and pulmonary vascularity are normal. No adenopathy. There is degenerative change in the thoracic spine. IMPRESSION: Tube and catheter  positions as described without pneumothorax. Lungs are clear except for slight bibasilar atelectasis. Cardiac silhouette within normal limits. Electronically Signed   By: Lowella Grip III M.D.   On: 01/13/2016 07:28    Per family, [place] has been repeated now "no fluid to drain", tho no report in EMR now -?  Medications reviewed in EMR.  On tid heparin prophylactic, will need to hold if platelet count drops further or if bleeding.   Discussion: talked at length with family at bedside and reviewed PACs images from CT 01-11-16 with daughter and nephew, showing them extensive involvement pelvis and peritoneal carcinomatosis, ascites around liver. Ascites has been bloody at paracentesis for 4 liters  01-09-16, 3.5 liters 1-31, 4 liters 1-28; malignant by cytology 12-30-15.  I have reviewed fact that surgical debulking of the recurrent malignancy would not be useful, discussed rapid reoccurrence of disease within 6 months of completing adjuvant platin and taxane chemotherapy as poor prognosis. I doubt rapid improvement with the recent adriamycin, if that is effective, but expect counts to continue to drop over next several days to a week, drop in platelets now not unexpected from the recent chemotherapy. Would hold heparin for lower platelets, tho certainly at risk for clots given advanced gyn malignancy also - discussed with CCM. Discussed use of peritoneal drainage catheter to manage symptomatic malignant ascites, tho apparently some US imaging today (?) does not show sufficient ascites; also not dropping platelets. Have told family that she is not appropriate for treatment trials given present situation. Family understands that more chemo immediately is not possible.  We  are glad that she is extubated and is mostly comfortable, still on pain medication tho lower dose. Family has asked about duration of dialysis, which they will discuss with nephrology. I have told them that she could have days to a few weeks to  live if nothing improves. They have asked about role of palliative care team, and would probably be very open to palliative care discussion as they consider options.    Assessment/Plan: 1.High grade endometrial carcinoma: rapidly recurrent in past ~ 3 weeks with large volume malignant ascites and extensive, bulky metastatic disease in abdomen and pelvis. Received first adriamycin on 01-10-16, in attempt to improve/ control the disease, however this is not situation of potential cure. Would not expect tumor lysis with this tumor type or chemo, doubt tumor lysis etiology for elevated K initially. Platelet count dropping, WBC and ANC still ok, not yet at nadir from the first adriamycin. Begin granix, which could increase WBC initially. I have told family that benefit from this chemo likely will not be apparent for another 1-2 weeks at best and will have to get thru nadir counts also. Ascites is malignant. At time of next paracentesis SUGGEST PERITONEAL CATHETER placement to allow more frequent small volume drainage.   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  2.Acute hypoxic respiratory failure 01-11-16: has been extubated, breathing ok now. Large ascites was also restricting breathing prior to intubation. 3.acute renal failure: on continuous dialysis, K better. Bilateral ureteral stents placed 01-13-16, only minimal urine output now  4.anemia with bloody malignant ascites 5.Diabetes on oral agents PTA 6.Severe protein calorie malnutrition. TNA would not help underlying malignancy, but may tolerate feedings per GI tract 7. Hyponatremia and electrolyte abnormalities including marked hyperkalemia on admission all improved with interventions since admission  8.extensive left flank bruising but no retroperitoneal bleeding by  scan 9.no LE DVTs by doppler 01-09-16 10.flu vaccine 01-08-16 11. Thrombocytopenia: timing not unexpected for recent chemotherapy. Expect will need to hold prophylactic heparin in next 24 hours.  12.Limited code more appropriate than full code in situation of underlying advanced malignancy. If worsens, DNR and comfort care would also be appropriate given underlying situation. Family is very appreciative of care by CCM, nephrology, urology and do seem to understand gravity of situation.  Note nephew is pharmacist in [employer] and one daughter is Secondary school teacher in [place].  WIll follow. Please page if I can assist between rounds [phone]   LIVESAY,LENNIS P

## 2016-01-14 NOTE — Progress Notes (Signed)
CRRT filter changed due to clotting. Restarted at 1000

## 2016-01-14 NOTE — Progress Notes (Signed)
[facility] Progress Note   Subjective: extubated, alert. MInimal UOP.   Filed Vitals:   01/14/16 1100 01/14/16 1200 01/14/16 1300 01/14/16 1400  BP: 162/74 103/41 93/52 107/84  Pulse: 108 105 95 103  Temp:  97.2 F (36.2 C)    TempSrc:  Oral    Resp: 17 16 13 16   Height:      Weight:      SpO2: 100% 100% 100% 98%    Inpatient medications: . antiseptic oral rinse  7 mL Mouth Rinse QID  . chlorhexidine gluconate  15 mL Mouth Rinse BID  . heparin  5,000 Units Subcutaneous 3 times per day  . hydrocortisone sodium succinate  50 mg Intravenous Q6H  . insulin aspart  0-15 Units Subcutaneous 6 times per day  . pantoprazole (PROTONIX) IV  40 mg Intravenous QHS  . piperacillin-tazobactam  3.375 g Intravenous 4 times per day  . sodium chloride flush  10-40 mL Intracatheter Q12H  . Tbo-filgastrim (GRANIX) SQ  300 mcg Subcutaneous Daily  . vancomycin  1,000 mg Intravenous Q24H   . norepinephrine (LEVOPHED) Adult infusion Stopped (01/14/16 0951)  . dialysis replacement fluid (prismasate) 500 mL/hr at 01/14/16 0700  . dialysis replacement fluid (prismasate) 300 mL/hr at 01/13/16 1746  . dialysate (PRISMASATE) 1,500 mL/hr at 01/14/16 1159   sodium chloride, fentaNYL (SUBLIMAZE) injection, heparin, heparin, sodium chloride flush  Exam: Extubated, alert, no distress No jvd Chest clear bilat RRR no MRG Abd mod-severe ascites, nontender 2-3+ bilat LE pitting edema No UE edema Neuro Ox 3, nf  CXR 2/11 > normal cxr CT chest 2/9 > perihilar edema, bibasilar consolidation L> R K 5.0 Na 132 Ca 7.3 Alb 1.6 adjCa 9.3 P 4.4 Hb 10.5 plt 111 WBC down 16k ECHO 1/31 > normal LV/ RV     Assessment: 1 AKI  (contrast/ hypotension/ L hydro/ larg vol paracentesis x 2) - little UOP, on CRRT D#3. Marked anasarca of LE's and malignant ascites; clear CXR.  3rd spaced fluid primarily due to low alb 1.5, cap leak from malignancy. Patient is not a candidate for long-term dialysis  given severe comorbidities, family and patient are all aware.  Will plan RRT day-to-day for now and look for any return of renal function. Will consult palliative care to assist.   2 Metastatic endometrial cancer- recurrent, s/p adriamycin 2/8 as salvage for recurrent disease, not a situation of potential cure per oncology.  3 DM on oral agents 4 Malnutrition/ hypoalbuminemia 5 Anemia  Plan - cont CRRT, keeping even. No heparin. Use citrate if starts to clot filters too much.    Kelly Splinter MD [facility] pager [phone]    cell [phone] 01/14/2016, 2:50 PM    Recent Labs Lab 01/13/16 0500 01/13/16 1538 01/14/16 0630  NA 132* 131* 133*  K 5.0 4.7 4.3  CL 100* 98* 98*  CO2 22 24 26   GLUCOSE 155* 146* 192*  BUN 34* 29* 22*  CREATININE 1.94* 1.83* 1.29*  CALCIUM 7.3* 7.6* 7.5*  PHOS 4.4 4.5 3.8    Recent Labs Lab 01/08/16 1132 01/11/16 1652  01/13/16 0500 01/13/16 1538 01/14/16 0630  AST 16 25  --   --   --   --   ALT 11 14  --   --   --   --   ALKPHOS 73 93  --   --   --   --   BILITOT 0.71 0.9  --   --   --   --  PROT 5.1* 5.7*  --   --   --   --   ALBUMIN 1.7* 2.1*  < > 1.6* 1.7* 1.5*  < > = values in this interval not displayed.  Recent Labs Lab 01/11/16 1652 01/12/16 0322 01/13/16 0500 01/14/16 0630  WBC 20.2* 18.2* 16.3* 16.0*  NEUTROABS 18.4*  --  15.6* 15.6*  HGB 12.9 11.6* 10.4* 8.8*  HCT 37.6 32.8* 29.7* 25.0*  MCV 87.6 87.2 87.1 87.4  PLT 257 216 111* 89*

## 2016-01-15 ENCOUNTER — Other Ambulatory Visit: Payer: BLUE CROSS/BLUE SHIELD

## 2016-01-15 ENCOUNTER — Encounter (HOSPITAL_COMMUNITY): Payer: Self-pay | Admitting: Urology

## 2016-01-15 ENCOUNTER — Ambulatory Visit: Payer: BLUE CROSS/BLUE SHIELD | Admitting: Oncology

## 2016-01-15 DIAGNOSIS — J9601 Acute respiratory failure with hypoxia: Secondary | ICD-10-CM | POA: Diagnosis present

## 2016-01-15 DIAGNOSIS — Z515 Encounter for palliative care: Secondary | ICD-10-CM

## 2016-01-15 DIAGNOSIS — A419 Sepsis, unspecified organism: Secondary | ICD-10-CM | POA: Diagnosis present

## 2016-01-15 DIAGNOSIS — R6521 Severe sepsis with septic shock: Secondary | ICD-10-CM

## 2016-01-15 DIAGNOSIS — R410 Disorientation, unspecified: Secondary | ICD-10-CM | POA: Diagnosis present

## 2016-01-15 DIAGNOSIS — N179 Acute kidney failure, unspecified: Secondary | ICD-10-CM | POA: Diagnosis present

## 2016-01-15 LAB — GLUCOSE, CAPILLARY
GLUCOSE-CAPILLARY: 184 mg/dL — AB (ref 65–99)
GLUCOSE-CAPILLARY: 151 mg/dL — AB (ref 65–99)
GLUCOSE-CAPILLARY: 174 mg/dL — AB (ref 65–99)
Glucose-Capillary: 105 mg/dL — ABNORMAL HIGH (ref 65–99)
Glucose-Capillary: 122 mg/dL — ABNORMAL HIGH (ref 65–99)
Glucose-Capillary: 124 mg/dL — ABNORMAL HIGH (ref 65–99)

## 2016-01-15 LAB — RENAL FUNCTION PANEL
ALBUMIN: 1.6 g/dL — AB (ref 3.5–5.0)
Albumin: 1.7 g/dL — ABNORMAL LOW (ref 3.5–5.0)
Anion gap: 10 (ref 5–15)
Anion gap: 10 (ref 5–15)
BUN: 17 mg/dL (ref 6–20)
BUN: 16 mg/dL (ref 6–20)
CALCIUM: 7.6 mg/dL — AB (ref 8.9–10.3)
CHLORIDE: 100 mmol/L — AB (ref 101–111)
CHLORIDE: 101 mmol/L (ref 101–111)
CO2: 26 mmol/L (ref 22–32)
CO2: 25 mmol/L (ref 22–32)
CREATININE: 0.97 mg/dL (ref 0.44–1.00)
Calcium: 7.8 mg/dL — ABNORMAL LOW (ref 8.9–10.3)
Creatinine, Ser: 0.98 mg/dL (ref 0.44–1.00)
GFR calc Af Amer: >60 mL/min (ref >60)
GFR calc non Af Amer: 59 mL/min — ABNORMAL LOW (ref >60)
GFR, EST NON AFRICAN AMERICAN: 60 mL/min — AB (ref >60)
GLUCOSE: 173 mg/dL — AB (ref 65–99)
Glucose, Bld: 109 mg/dL — ABNORMAL HIGH (ref 65–99)
PHOSPHORUS: 3 mg/dL (ref 2.5–4.6)
POTASSIUM: 3.7 mmol/L (ref 3.5–5.1)
Phosphorus: 2.5 mg/dL (ref 2.5–4.6)
Potassium: 3.8 mmol/L (ref 3.5–5.1)
SODIUM: 136 mmol/L (ref 135–145)
Sodium: 136 mmol/L (ref 135–145)

## 2016-01-15 LAB — CBC WITH DIFFERENTIAL/PLATELET
BASOS ABS: 0 10*3/uL (ref 0.0–0.1)
Basophils Relative: 0 %
Eosinophils Absolute: 0 10*3/uL (ref 0.0–0.7)
Eosinophils Relative: 0 %
HEMATOCRIT: 24.5 % — AB (ref 36.0–46.0)
Hemoglobin: 8.4 g/dL — ABNORMAL LOW (ref 12.0–15.0)
LYMPHS ABS: 0.3 10*3/uL — AB (ref 0.7–4.0)
Lymphocytes Relative: 1 %
MCH: 30.9 pg (ref 26.0–34.0)
MCHC: 34.3 g/dL (ref 30.0–36.0)
MCV: 90.1 fL (ref 78.0–100.0)
MONO ABS: 0 10*3/uL — AB (ref 0.1–1.0)
MONOS PCT: 0 %
NEUTROS ABS: 26.2 10*3/uL — AB (ref 1.7–7.7)
Neutrophils Relative %: 99 %
PLATELETS: 85 10*3/uL — AB (ref 150–400)
RBC: 2.72 MIL/uL — AB (ref 3.87–5.11)
RDW: 14 % (ref 11.5–15.5)
WBC: 26.5 10*3/uL — AB (ref 4.0–10.5)

## 2016-01-15 LAB — MAGNESIUM: MAGNESIUM: 2.4 mg/dL (ref 1.7–2.4)

## 2016-01-15 MED ORDER — VITAMINS A & D EX OINT
TOPICAL_OINTMENT | CUTANEOUS | Status: AC
  Administered 2016-01-15: 5
  Filled 2016-01-15: qty 25

## 2016-01-15 MED ORDER — INSULIN ASPART 100 UNIT/ML ~~LOC~~ SOLN
0.0000 [IU] | Freq: Three times a day (TID) | SUBCUTANEOUS | Status: DC
  Administered 2016-01-15 – 2016-01-16 (×3): 2 [IU] via SUBCUTANEOUS
  Administered 2016-01-16: 7 [IU] via SUBCUTANEOUS
  Administered 2016-01-16: 3 [IU] via SUBCUTANEOUS
  Administered 2016-01-17 – 2016-01-19 (×8): 2 [IU] via SUBCUTANEOUS
  Administered 2016-01-19: 3 [IU] via SUBCUTANEOUS
  Administered 2016-01-20 (×3): 2 [IU] via SUBCUTANEOUS
  Administered 2016-01-21: 3 [IU] via SUBCUTANEOUS
  Administered 2016-01-21 – 2016-01-22 (×5): 2 [IU] via SUBCUTANEOUS
  Administered 2016-01-23: 3 [IU] via SUBCUTANEOUS

## 2016-01-15 MED ORDER — PANTOPRAZOLE SODIUM 40 MG PO TBEC
40.0000 mg | DELAYED_RELEASE_TABLET | Freq: Every day | ORAL | Status: DC
  Administered 2016-01-15 – 2016-01-30 (×16): 40 mg via ORAL
  Filled 2016-01-15 (×16): qty 1

## 2016-01-15 MED ORDER — INSULIN ASPART 100 UNIT/ML ~~LOC~~ SOLN
0.0000 [IU] | Freq: Every day | SUBCUTANEOUS | Status: DC
  Administered 2016-01-18 – 2016-01-23 (×4): 2 [IU] via SUBCUTANEOUS

## 2016-01-15 NOTE — Consult Note (Signed)
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 Recently received chemotherapy. After receiving PRBCs, went into resp failure, was brought to the hospital for acute hypoxic resp failure and multiple electrolyte derangements.   S/p VDRF, now successfully extubated since 2/12. Respiratory failure progressively improving. Remains on CRRT and pressors. Continuing broad spectrum antibiotics for probable Sepsis but no source identified yet. Bedside ultrasound did not show a sufficient amount of fluid amenable to paracentesis on 2/12. Receiving stress dose steroids for possible adrenal insufficiency. Has underlying history of DM.   Patient remains in the ICU. She is on pressors, NGT has been d/c, she is tolerating clear liquids. CRRT ongoing. Patient is not a HD candidate, palliative consult for further discussions.   Patient is a pleasant [place] lady, resting in bed, she is awake, not very alert, answers few questions appropriately. Daughter [name] is present at the bedside. Patient had a small bowel movement.   Discussed with the patient, daughter, patient's sister, son in law about the current hospital course and the patient's multiple serious illnesses. Discussed high likelihood of not doing better than any chance of meaningful recovery. Goals of care attempted to be elicited.     Contacts/Participants in Discussion: Primary Decision Maker:   Husband, 2 daughters.   Relationship to Patient   HCPOA: yes   husband, 2 daughters, sons in Sports coach.     SUMMARY OF RECOMMENDATIONS:  No change in current goals as of now.  Continue current course of treatment ( current partial code, pressors, CRRT ) as time trial for next 48 hours.  Recommend family meeting with all concerned parties on 01-17-16: will discuss with daughter on 01-16-16.    Code Status/Advance Care Planning: Limited code    Code Status Orders        Start     Ordered   01/11/16 2106  Limited resuscitation (code)   Continuous    Question Answer Comment  In the event of cardiac or respiratory ARREST: Initiate Code Blue, Call Rapid Response No   In the event of cardiac or respiratory ARREST: Perform CPR No   In the event of cardiac or respiratory ARREST: Perform Intubation/Mechanical Ventilation Yes   In the event of cardiac or respiratory ARREST: Use NIPPV/BiPAp only if indicated Yes   In the event of cardiac or respiratory ARREST: Administer ACLS medications if indicated Yes   In the event of cardiac or respiratory ARREST: Perform Defibrillation or Cardioversion if indicated No      01/11/16 2107    Code Status History    Date Active Date Inactive Code Status Order ID Comments User Context   01/11/2016  8:26 PM 01/11/2016  9:07 PM Partial Code [id number]  Corey Harold, NP ED   01/05/2016  1:38 AM 01/07/2016  7:04 PM Full Code [id number]  Edwin Dada, MD Inpatient   12/29/2015  4:27 PM 01/04/2016  4:59 PM Full Code [id number]  Domenic Polite, MD ED   01/19/2015  8:22 PM 01/20/2015  4:39 PM Full Code [id number]  Lahoma Crocker, MD Inpatient      Other Directives:Other  Symptom Management:    fentanyl IV PRN  Palliative  Prophylaxis:   Delirium protocol.   Psycho-social/Spiritual:  Support System: Strong Desire for further Chaplaincy support:no Additional Recommendations: Caregiving  Support/Resources  Prognosis: guarded  Discharge Planning: pending hospital course.    Chief Complaint/ Primary Diagnoses: Present on Admission:  . Hyperkalemia  I have  reviewed the medical record, interviewed the patient and family, and examined the patient. The following aspects are pertinent.  Past Medical History  Diagnosis Date  . Hypertension   . Glaucoma   . [place]   . Palpitations   . Muscle cramps   . Postmenopausal vaginal bleeding   . Uterine cancer [facility])    Social History   Social History  . Marital Status: Married    Spouse Name: N/A  . Number of Children: 2  . Years of Education: N/A   Social History Main Topics  . Smoking status: Never Smoker   . Smokeless tobacco: Never Used  . Alcohol Use: No  . Drug Use: No  . Sexual Activity: Yes   Other Topics Concern  . None   Social History Narrative   Family History  Problem Relation Age of Onset  . Diabetes Father   . Arrhythmia Mother    Scheduled Meds: . antiseptic oral rinse  7 mL Mouth Rinse BID  . heparin  5,000 Units Subcutaneous 3 times per day  . insulin aspart  0-5 Units Subcutaneous QHS  . insulin aspart  0-9 Units Subcutaneous TID WC  . latanoprost  1 drop Both Eyes QHS  . pantoprazole  40 mg Oral Daily  . piperacillin-tazobactam  3.375 g Intravenous 4 times per day  . sodium chloride flush  10-40 mL Intracatheter Q12H  . Tbo-filgastrim (GRANIX) SQ  300 mcg Subcutaneous Daily  . timolol  1 drop Both Eyes BID  . vancomycin  1,000 mg Intravenous Q24H   Continuous Infusions: . norepinephrine (LEVOPHED) Adult infusion 3 mcg/min (01/15/16 1010)  . dialysis replacement fluid (prismasate) 500 mL/hr at 01/15/16 0502  . dialysis replacement fluid (prismasate) 300 mL/hr at 01/15/16 1125  . dialysate (PRISMASATE) 1,500 mL/hr at 01/15/16 0818   PRN Meds:.sodium chloride, fentaNYL (SUBLIMAZE) injection, heparin, heparin, sodium chloride flush Medications Prior to Admission:  Prior to Admission medications   Medication Sig Start Date End Date Taking? Authorizing Provider  acetaminophen (TYLENOL) 325 MG tablet Take 650 mg by mouth  every 6 (six) hours as needed for mild pain, moderate pain or headache. Reported on 01/08/2016   Yes Historical Provider, MD  ferrous fumarate (HEMOCYTE - 106 MG FE) 325 (106 FE) MG TABS tablet Take 1 tablet daily  on an empty stomach with OJ or vitamin C tablet 09/11/15  Yes [name], MD  glipiZIDE (GLUCOTROL XL) 5 MG 24 hr tablet Take 5 mg by mouth 2 (two) times daily.   Yes Historical Provider, MD  latanoprost (XALATAN) 0.005 % ophthalmic solution Place 1 drop into both eyes at bedtime.   Yes Historical Provider, MD  lidocaine-prilocaine (EMLA) cream Apply to Porta-Cath 1-2 hrs prior to access as directed. 01/02/16  Yes [name], MD  magnesium 30 MG tablet Take 30 mg by mouth daily.   Yes Historical Provider, MD  metFORMIN (GLUCOPHAGE) 1000 MG tablet Take 1,000 mg by mouth 2 (two) times daily. 12/12/15  Yes Historical Provider, MD  oxyCODONE (ROXICODONE) 5 MG immediate release tablet Take 1 tablet every 4-6 hours as needed for pain. 01/08/16  Yes Lennis P Marko Plume, MD  pantoprazole (PROTONIX) 40 MG tablet Take  1 tablet (40 mg total) by mouth daily. 11/20/15  Yes Everitt Amber, MD  pravastatin (PRAVACHOL) 20 MG tablet Take 20 mg by mouth daily.   Yes Historical Provider, MD  timolol (BETIMOL) 0.5 % ophthalmic solution Place 1 drop into both eyes 2 (two) times daily.   Yes Historical Provider, MD  feeding supplement, GLUCERNA SHAKE, (GLUCERNA SHAKE) LIQD Take 237 mLs by mouth daily. Patient not taking: Reported on 01/08/2016 01/04/16   Silver Huguenin Elgergawy, MD  furosemide (LASIX) 40 MG tablet Take 1 tablet (40 mg total) by mouth 2 (two) times daily. Patient not taking: Reported on 01/08/2016 01/07/16   Silver Huguenin Elgergawy, MD  gabapentin (NEURONTIN) 100 MG capsule Take 1 capsule (100 mg total) by mouth at bedtime. Take 1 at bedtime for neuropathy Patient not taking: Reported on 01/04/2016 08/14/15   [name], MD  LORazepam (ATIVAN) 0.5 MG tablet Place 1 tablet under tongue or swallow every 8 hours  as needed for nausea. Will make drowsy. Patient not taking: Reported on 01/08/2016 01/07/16   Silver Huguenin Elgergawy, MD  ondansetron (ZOFRAN) 8 MG tablet Take 1 tablet by mouth every 8 hours as needed for nausea. Will not make drowsy. Patient not taking: Reported on 01/08/2016 01/02/16   [name], MD  prochlorperazine (COMPAZINE) 10 MG tablet Take 1 tablet (10 mg total) by mouth every 6 (six) hours as needed for nausea or vomiting. Patient not taking: Reported on 01/08/2016 01/02/16   [name], MD  protein supplement shake (PREMIER PROTEIN) LIQD Take 237 mLs (8 oz total) by mouth 2 (two) times daily between meals. Patient not taking: Reported on 01/11/2016 01/04/16   Silver Huguenin Elgergawy, MD   No Known Allergies  Review of Systems Positive for generalized weakness, fatigue.   Physical Exam Appears chronically ill resting in bed Generalized weakness S1 S2 Clear anteriorly Abdomen distended trace edema  Vital Signs: BP 99/65 mmHg  Pulse 102  Temp(Src) 97.3 F (36.3 C) (Axillary)  Resp 21  Ht 5\' 2"  (1.575 m)  Wt 78.3 kg (172 lb 9.9 oz)  BMI 31.56 kg/m2  SpO2 97%  SpO2: SpO2: 97 % O2 Device:SpO2: 97 % O2 Flow Rate: .O2 Flow Rate (L/min): 2 L/min  IO: Intake/output summary:  Intake/Output Summary (Last 24 hours) at 01/15/16 1332 Last data filed at 01/15/16 1300  Gross per 24 hour  Intake 1647.02 ml  Output   1570 ml  Net  77.02 ml    LBM: Last BM Date: 01/15/16 Baseline Weight: Weight: 78 kg (171 lb 15.3 oz) Most recent weight: Weight: 78.3 kg (172 lb 9.9 oz)      Palliative Assessment/Data:  Flowsheet Rows        Most Recent Value   Intake Tab    Referral Department  Hospitalist   Unit at Time of Referral  ICU   Palliative Care Primary Diagnosis  Cancer   Date Notified  01/14/16   Palliative Care Type  New Palliative care   Reason for referral  Clarify Goals of Care   Date of Admission  01/11/16   Date first seen by Palliative Care  01/15/16   # of days  Palliative referral response time  1 Day(s)   # of days IP prior to Palliative referral  3   Clinical Assessment    Palliative Performance Scale Score  20%   Pain Max last 24 hours  4   Pain Min Last 24 hours  3   Dyspnea Max Last  24 Hours  4   Dyspnea Min Last 24 hours  3   Psychosocial & Spiritual Assessment    Palliative Care Outcomes    Patient/Family meeting held?  Yes   Who was at the meeting?  daughter, patient, patient's sister, patient's son in law.    Palliative Care Outcomes  Clarified goals of care   Palliative Care follow-up planned  Yes, Facility      Additional Data Reviewed:  CBC:    Component Value Date/Time   WBC 26.5* 01/15/2016 0415   WBC 15.2* 01/10/2016 1323   HGB 8.4* 01/15/2016 0415   HGB 8.6* 01/10/2016 1323   HCT 24.5* 01/15/2016 0415   HCT 26.8* 01/10/2016 1323   PLT 85* 01/15/2016 0415   PLT 245 01/10/2016 1323   MCV 90.1 01/15/2016 0415   MCV 89.6 01/10/2016 1323   NEUTROABS 26.2* 01/15/2016 0415   NEUTROABS 13.1* 01/10/2016 1323   LYMPHSABS 0.3* 01/15/2016 0415   LYMPHSABS 0.7* 01/10/2016 1323   MONOABS 0.0* 01/15/2016 0415   MONOABS 1.3* 01/10/2016 1323   EOSABS 0.0 01/15/2016 0415   EOSABS 0.0 01/10/2016 1323   BASOSABS 0.0 01/15/2016 0415   BASOSABS 0.0 01/10/2016 1323   Comprehensive Metabolic Panel:    Component Value Date/Time   NA 136 01/15/2016 0415   NA 122 Repeated and Verified* 01/08/2016 1132   K 3.8 01/15/2016 0415   K 4.7 01/08/2016 1132   CL 100* 01/15/2016 0415   CO2 26 01/15/2016 0415   CO2 16* 01/08/2016 1132   BUN 17 01/15/2016 0415   BUN 30.0* 01/08/2016 1132   CREATININE 0.97 01/15/2016 0415   CREATININE 1.6* 01/08/2016 1132   GLUCOSE 109* 01/15/2016 0415   GLUCOSE 260* 01/08/2016 1132   CALCIUM 7.6* 01/15/2016 0415   CALCIUM 8.3* 01/08/2016 1132   AST 25 01/11/2016 1652   AST 16 01/08/2016 1132   ALT 14 01/11/2016 1652   ALT 11 01/08/2016 1132   ALKPHOS 93 01/11/2016 1652   ALKPHOS 73 01/08/2016 1132    BILITOT 0.9 01/11/2016 1652   BILITOT 0.71 01/08/2016 1132   PROT 5.7* 01/11/2016 1652   PROT 5.1* 01/08/2016 1132   ALBUMIN 1.6* 01/15/2016 0415   ALBUMIN 1.7* 01/08/2016 1132     Time In: 9 Time Out: 10   Time Total: 60 min  Greater than 50%  of this time was spent counseling and coordinating care related to the above assessment and plan.  Signed by: Loistine Chance, MD [place], MD  01/15/2016, 1:32 PM  Please contact Palliative Medicine Team phone at [phone] for questions and concerns.

## 2016-01-15 NOTE — Progress Notes (Signed)
Date: January 15, 2016 Chart reviewed for concurrent status and case management needs. Will continue to follow patient for changes and needs: iv levophed for hypotension, wbc 26.5/  Velva Harman, BSN, [place], [place]   [phone]

## 2016-01-15 NOTE — Progress Notes (Signed)
[facility] Progress Note   Subjective:  Remains extubated Palliative care has seen Appears plan is continue with current level for another 48 hours (pressors, CRRT, partial code)  CRRT all 4K fluids, keeping volume even, no heparin, no filter problems  Filed Vitals:   01/15/16 1128 01/15/16 1200 01/15/16 1300 01/15/16 1400  BP:  108/55 99/65 102/52  Pulse: 101 95 102 105  Temp: 97.3 F (36.3 C)     TempSrc: Axillary     Resp: 24 21 21 25   Height:      Weight:      SpO2: 97% 98% 97% 95%   Exam: Sitting up in the chair, room air, very pleasant, denies pain (thugh family says had complaints that "hands were tight" No distress No jvd Chest clear bilat Regular, tachy at 110 Abd mod-severe ascites, nontender - very tight - + abd wall edema 2-3+ bilat LE pitting edema to hips Hands a little edematous now   Inpatient medications: . antiseptic oral rinse  7 mL Mouth Rinse BID  . heparin  5,000 Units Subcutaneous 3 times per day  . insulin aspart  0-5 Units Subcutaneous QHS  . insulin aspart  0-9 Units Subcutaneous TID WC  . latanoprost  1 drop Both Eyes QHS  . pantoprazole  40 mg Oral Daily  . piperacillin-tazobactam  3.375 g Intravenous 4 times per day  . sodium chloride flush  10-40 mL Intracatheter Q12H  . Tbo-filgastrim (GRANIX) SQ  300 mcg Subcutaneous Daily  . timolol  1 drop Both Eyes BID  . vancomycin  1,000 mg Intravenous Q24H  Infusions: . norepinephrine (LEVOPHED) Adult infusion 3 mcg/min (01/15/16 1010)  . dialysis replacement fluid (prismasate) 500 mL/hr at 01/15/16 0502  . dialysis replacement fluid (prismasate) 300 mL/hr at 01/15/16 1125  . dialysate (PRISMASATE) 1,500 mL/hr at 01/15/16 0818   Prn medications sodium chloride, fentaNYL (SUBLIMAZE) injection, heparin, heparin, sodium chloride flush    Recent Labs Lab 01/14/16 0630 01/14/16 1630 01/15/16 0415  NA 133* 135 136  K 4.3 4.0 3.8  CL 98* 100* 100*  CO2 26 24 26   GLUCOSE  192* 190* 109*  BUN 22* 20 17  CREATININE 1.29* 1.14* 0.97  CALCIUM 7.5* 7.7* 7.6*  PHOS 3.8 3.5 3.0    Recent Labs Lab 01/11/16 1652  01/14/16 0630 01/14/16 1630 01/15/16 0415  AST 25  --   --   --   --   ALT 14  --   --   --   --   ALKPHOS 93  --   --   --   --   BILITOT 0.9  --   --   --   --   PROT 5.7*  --   --   --   --   ALBUMIN 2.1*  < > 1.5* 1.6* 1.6*  < > = values in this interval not displayed.  Recent Labs Lab 01/13/16 0500 01/14/16 0630 01/15/16 0415  WBC 16.3* 16.0* 26.5*  NEUTROABS 15.6* 15.6* 26.2*  HGB 10.4* 8.8* 8.4*  HCT 29.7* 25.0* 24.5*  MCV 87.1 87.4 90.1  PLT 111* 89* 85*    CXR 2/11 > normal cxr CT chest 2/9 > perihilar edema, bibasilar consolidation L> R ECHO 1/31 > normal LV/ RV     Assessment: 1. AKI  (contrast/ hypotension/ L hydro/ larg vol paracentesis x 2) - oligoanuric on CRRT D#4. Marked anasarca of LE's and malignant ascites; clear CXR.  3rd spaced fluid primarily due to low  alb 1.5, cap leak from malignancy. Has bilateral ureteral stents (2/11).  Patient is not a candidate for long-term dialysis given severe comorbidities, family and patient are all aware.  UOP neglgible (100 cc or less/day) No evidence for any return of renal function. K and phos are stable.  Remains on a small dose of norepi.  Not a candidate for HD. Would like to try to pull some fluid if she will tolerate - will set a goal range of 0-75 so that nursing can pull if hourly BP's allow 2. Metastatic endometrial cancer- recurrent, s/p adriamycin 2/8 as salvage for recurrent disease, not a situation of potential cure per oncology. Dr. Marko Plume recommends peritoneal catheter to help keep ascites decompressed. Last paracentesis was 2/11 - 4 liters. 3. Resp failure - extubated and on room air at the present time.  4. DM on oral agents 5. Malnutrition/ hypoalbuminemia 6. Anemia 7. Disposition - "time trial" of current management for another 48 hours.  Palliative care is now  involved. Now a limited code.   Jamal Maes, MD [facility] [phone] Pager 01/15/2016, 3:01 PM

## 2016-01-15 NOTE — Progress Notes (Addendum)
PULMONARY / CRITICAL CARE MEDICINE   Name: Brittany Schwartz MRN: [id number] DOB: August 12, 1951    ADMISSION DATE:  01/11/2016 CONSULTATION DATE:  01/11/2016  REFERRING MD:  Brittany Schwartz  CHIEF COMPLAINT:  Respiratory distress  HISTORY OF PRESENT ILLNESS:   65 year old female with PMH as below, which includes grade 3 serous/ endometrioid endometrial carcinoma IIIA at diagnosis, adjuvant chemo completed 06/16/15.  Now with rapid recurrent disease since mid January with large volume malignant ascites, bulky involvement pelvis and abdominal caking.  She is followed by Dr. Marko Schwartz with oncology and is currently undergoing chemo.  Family reports that oncologist feels as though there is hope for improvement with chemotherapy. She received last dose of chemotherapy 2/8. She had 2u PRBC transfusion 2/9 for symptomatic anemia.  During transfusion, she complained of SOB with diaphoresis.  She was subsequently sent to ED for further evaluation.  In ED, she was hypertensive and remained SOB.  She was therefore placed on BiPAP. CXR showed significant pulmonary edema. Laboratory assessment was significant for renal failure with K 7.2, Na 119 (chronically low), and SCr 3.61. She is also hyperglycemic with Glucose 369. Lab eval also notable for WBC 20.2. CT abdomen from a few days prior consistent with progressive mass size with compression of L ureter and L hydronephrosis. She was unable to tolerate BiPAP in ED and required intubation.  PCCM asked to admit.  Extensive discussion with family by Brittany Schwartz.  Given Brittany Schwartz's hx and current circumstances, decision made to limit code status to intubation and vasopressors only.  In the event of cardiac arrest, we would not put her through CPR or defibrillation.  Full medical management otherwise.  STUDIES:  TTE 1/31:  LV EF 65-70%. Normal wall motion. Grade 1 diastolic dysfunction. LA & RA normal in size. RV normal in size & function. PASP 21 mmHg. Normal aortic & mitral  valves. No pericardial effusion. Port CXR 2/11: No opacity. ETT in good position. R IJ CVL in good position.  TTE 2/11:  EF 65-70% w/ normal wall motion. Grade 1 diastolic dysfunction. RV normal in size & function. No effusion.  MICROBIOLOGY: Blood Ctx x2 2/9>> Urine Ctx 2/9:  Negative  Blood Ctx x2 2/3:  Negative  Urine Ctx 2/2:  Negative   ANTIBIOTICS: Vanc 02/09 > Zosyn 02/09 >  SIGNIFICANT EVENTS: 02/09 > admitted with acute hypoxic respiratory failure, multiple electrolyte derangements. 02/10 > started CVVHD 02/11 > ureteral stent placed  LINES/TUBES: OETT 7.5 02/09 > R IJ HD cath 02/09 > Foley 2/9 > OGT 2/9 > R Chest Brittany Schwartz LP 1/31 >  SUBJECTIVE: Unable to completely wean off of vasopressors. Denies any dyspnea or cough. No headache or vision changes.  REVIEW OF SYSTEMS:  No fever or sweats. Patient does feel chilled. No chest pain or pressure.  VITAL SIGNS: BP 114/61 mmHg  Pulse 94  Temp(Src) 97.3 F (36.3 C) (Oral)  Resp 19  Ht 5\' 2"  (1.575 m)  Wt 78.3 kg (172 lb 9.9 oz)  BMI 31.56 kg/m2  SpO2 96%  HEMODYNAMICS:    VENTILATOR SETTINGS:    INTAKE / OUTPUT: I/O last 3 completed shifts: In: 1782.1 [P.O.:415; I.V.:759.1; Other:108; IV Piggyback:500] Out: [id number] [Urine:148; Emesis/NG output:150; Other:1563]  PHYSICAL EXAMINATION: General:  Awake. Alert. No distress. Family at bedside.  Integument:  Warm & dry. No rash on exposed skin.  HEENT:  No scleral injection. No oral ulcers. MMM. Cardiovascular:  Regular rhythm. Mild anasarca. Unable to appreciate JVD.  Pulmonary: Clear to aucultation. Normal  work of breathing on room air. Abdomen: Soft. Normal bowel sounds. Protuberant.  Neurological:  Not aware of season or president. Knows the year. Following commands.  LABS:  BMET  Recent Labs Lab 01/14/16 0630 01/14/16 1630 01/15/16 0415  NA 133* 135 136  K 4.3 4.0 3.8  CL 98* 100* 100*  CO2 26 24 26   BUN 22* 20 17  CREATININE 1.29* 1.14* 0.97   GLUCOSE 192* 190* 109*    Electrolytes  Recent Labs Lab 01/13/16 0500  01/14/16 0630 01/14/16 1630 01/15/16 0415  CALCIUM 7.3*  < > 7.5* 7.7* 7.6*  MG 2.2  --  2.4  --  2.4  PHOS 4.4  < > 3.8 3.5 3.0  < > = values in this interval not displayed.  CBC  Recent Labs Lab 01/13/16 0500 01/14/16 0630 01/15/16 0415  WBC 16.3* 16.0* 26.5*  HGB 10.4* 8.8* 8.4*  HCT 29.7* 25.0* 24.5*  PLT 111* 89* 85*    Coag's No results for input(s): APTT, INR in the last 168 hours.  Sepsis Markers  Recent Labs Lab 01/11/16 2109  LATICACIDVEN 3.0*    ABG  Recent Labs Lab 01/11/16 2201 01/12/16 0020 01/12/16 0353  PHART 7.201* 7.294* 7.400  PCO2ART 34.1* 33.2* 26.5*  PO2ART 391* 220* 152*    Liver Enzymes  Recent Labs Lab 01/08/16 1132 01/11/16 1652  01/14/16 0630 01/14/16 1630 01/15/16 0415  AST 16 25  --   --   --   --   ALT 11 14  --   --   --   --   ALKPHOS 73 93  --   --   --   --   BILITOT 0.71 0.9  --   --   --   --   ALBUMIN 1.7* 2.1*  < > 1.5* 1.6* 1.6*  < > = values in this interval not displayed.  Cardiac Enzymes  Recent Labs Lab 01/11/16 2205  TROPONINI 0.26*    Glucose  Recent Labs Lab 01/14/16 1202 01/14/16 1551 01/14/16 1915 01/15/16 0001 01/15/16 0427 01/15/16 0813  GLUCAP 155* 117* 220* 124* 122* 105*    Imaging No results found.  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 Successfully extubated 2/12. Respiratory failure progressively improving. Awaiting return of renal function. Continuing broad spectrum antibiotics for probable Sepsis but no source identified yet. Bedside ultrasound did not show a sufficient amount of fluid amenable to paracentesis on 2/12. Patient unable to get completely off pressors. Plan to readdress code status tomorrow given mild delirium today.  1. Recurrent Endometrial Cancer:  Oncology following. Known malignant ascites.  Patient s/p fist dose of chemotherapy recently. Trending cell counts daily with CBC. Fentanyl IV prn. 2. Acute Renal Failure:  Currently on CVVHD & tolerating well. Nephrology following. Assess daily for signs of recovery. Trending electrolytes & renal function twice daily. 3. Septic Shock: Still requiring low dose vasopressors. Awaiting blood culture results. Day #4 of empiric Vancomycin & Zosyn. 4. Possible Adrenal Insufficiency:  Day #5 of stress dose steroids. Discontinuing. 5. Anemia/Thrombocytopenia:  No signs of active bleeding. Likely secondary to chemotherapy. Trending cell counts daily.  6. H/O DM:  BG controlled. Low dose SSI algorithm & changing accu-checks to qAC & HS. 7. Elevated Troponin I:  Likely demand ischemia in setting of shock. No wall motion abnormalities on TTE to suggest MI. 8. Acute Hypoxic Respiratory Failure:  Resolved. Extubated 2/12. Continuing pulmonary toilette with IS. 9.  Acute Encephalopathy:  Likely toxic metabolic vs delirium. Improving. 10. Hyponatremia:  Resolved. 11. Prophylaxis:  Heparin [place] q8hr while platelet count allows along with SCDs. Switching to Protonix PO daily. 12. Diet:  Clear liquid diet. Advancing as tolerated to diabetic diet. 13. Disposition:  Patient will remain as long as she needs CVVHD & pressors.   Family Updates: Daughter updated at bedside by Dr. Ashok Cordia 2/13.  I have spent a total of 37 minutes of critical care time today caring for the patient, reviewing the patient's electronic medical record, and discussing plan of care with family at bedside.  Sonia Baller Ashok Cordia, M.D. [facility] Pager:  [phone] After 3pm or if no response, call [phone] 01/15/2016, 10:41 AM

## 2016-01-15 NOTE — Progress Notes (Signed)
210cc of Fentanyl wasted in the sink with Jennye Moccasin, RN as witness. Luther Parody, RN

## 2016-01-15 NOTE — Op Note (Signed)
Brittany Schwartz, Brittany Schwartz              ACCOUNT NO.:  [id number]  MEDICAL RECORD NO.:  [id number]  LOCATION:                                 FACILITY:  PHYSICIAN:  [name], MD     DATE OF BIRTH:  10-02-1951  DATE OF PROCEDURE:  01/13/2016                              OPERATIVE REPORT   DIAGNOSES:  Advanced gynecologic cancer with malignant obstruction and acute renal failure.  PROCEDURES: 1. Cystoscopy with bilateral retrograde pyelograms and interpretation. 2. Insertion of bilateral ureteral stents, 6 x 24, Contour, no tether.  ESTIMATED BLOOD LOSS:  Nil.  COMPLICATION:  None.  SPECIMENS:  None.  FINDINGS: 1. Some increased erythema and vascularity of the posterior bladder,     especially the left hemi-trigone area.  No frank mass, but     worrisome for extrinsic compression from pelvic malignancy. 2. Lateral splaying and narrowing of the left distal ureter on     retrograde pyelogram consistent with extrinsic compression from     pelvic malignancy. 3. Minimal bilateral hydronephrosis. 4. Successful placement of bilateral ureteral stents, proximal in the     renal pelvis and distal in the urinary bladder.  DRAINS:  Foley catheter to straight drain.  INDICATION:  Brittany Schwartz is a very unfortunate 65 year old lady with history of advanced gynecologic malignancy.  She has been on chemotherapy, has diffuse metastatic disease as well as large pelvic recurrence with malignant ascites.  She is presently in acute renal failure, likely multifactorial and on workup of this, was found to have new left hydronephrosis with delayed nephrogram from imaging obtained several days prior, it was felt that likely malignant obstruction was at least part of the contributor to acute renal failure.  Options for management and malignant obstruction were discussed with the patient and family including her husband and daughters extensively including a purely palliative approach, which was  certainly not be unreasonable given her overall clinical outlook versus a more aggressive approach with placement of bilateral ureteral stents to allow for maximal renal decompression.  Her family adamantly still wishes to proceed with maximally aggressive approach with bilateral stenting.  Informed consent was obtained and placed in the medical record.  PROCEDURE IN DETAIL:  The patient being [name] was verified. Procedure being cysto with bilateral stent placement was confirmed. Procedure was carried out.  Time-out was performed.  Intravenous antibiotics were administered.  General endotracheal anesthesia was introduced.  The patient was into a low lithotomy position and sterile field was created by prepping and draping the patient's vagina, introitus, and proximal thighs using iodine x3.  Next, cystourethroscopy was performed using a 23-French rigid cystoscope with 30-degree offset lens.  Inspection of the urinary bladder revealed some increased vascularity and erythema of the left hemi-trigone area consistent with likely extrinsic compression from localized gynecologic malignancy.  The left ureteral orifice was cannulated with a 6-French end-hole catheter and left retrograde pyelogram obtained.  Left retrograde pyelogram demonstrated a single left ureter with single- system left kidney.  There was significant narrowing and lateral splaying of the distal ureter consistent with extrinsic compression. There was minimal hydronephrosis.  A 0.038 zip wire was advanced to the level of upper pole,  over which, a new 6 x 24 Contour type stent was carefully placed, good proximal and distal deployment were noted. Similarly, the right ureteral orifice was cannulated with 6-French end- hole catheter and right retrograde pyelogram was obtained.  Right retrograde pyelogram demonstrated a single right ureter with single-system right kidney.  There was some much milder splaying laterally of  the distal ureter consistent with mild extrinsic compression.  A 0.038 zip wire was advanced at the level of the upper pole, over which, a new 6 x 24 Contour type stent was placed, good proximal and distal deployment were noted.  The efflux of urine was seen around and through the distal end of bilateral stents.  The cystoscope was removed.  Then, a new 16-French Foley catheter was placed per urethra to straight drain.  A 10 mL of sterile water in the balloon and procedure was terminated.  The patient tolerated the procedure well. There were no immediate periprocedural complications.  The patient then transferred via Anesthesia, transported back to the intensive care unit where she remains in critical condition.    ______________________________ [name], MD   ______________________________ [name], MD    TM/MEDQ  D:  01/13/2016  T:  01/13/2016  Job:  [id number]

## 2016-01-15 NOTE — Progress Notes (Signed)
Medical Oncology January 15, 2016, 8:02 AM  Hospital day 5 Antibiotics: vanc zosyn Chemotherapy: first adriamycin 01-10-16 as salvage for recurrent endometrial carcinoma.  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  On CRRT and still on levophed  Subjective: NG out, tolerating ice chips, denies nausea, clear liquid tray arrived. Throat still irritated from NG/ ETT. Denies increased SOB. Pain medication helpful. No overt bleeding. On bedpan now for BM, may be first since admission. Slept a little. Tolerating the CRRT    ONCOLOGIC HISTORY Paient presented to Dr [name] 01-03-15 with vaginal spotting x 1 year. Pelvic US showed 33 mm endometrial stripe with ovaries normal, and endometrial biopsy 01-03-15 grade 3 endometrial adenocarcinoma. CT CAP 01-13-15 had negative chest, no adenopathy, mild fatty liver, markedly thickened endometrium and no evidence of metastatic disease outside of uterus. CA 125 preoperatively on 01-13-15 was 22.9. Surgery by Dr [name] 01-19-15 was robotic hysterectomy, BSO and resection of nodule at sigmoid serosa. At surgery there was milial tumor studding with 5-10 mm implants on bladder peritoneum and miliary disease on pelvic sidewalls, without upper abdominal disease evident, and omentum appeared normal. 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 Recommendation for 6 cycles of taxol carboplatin, then consideration of external beam RT + vaginal brachytherapy if CR. First carboplatin taxol was given 02-16-2015; she was neutropenic with ANC 0.7 by day 15 cycle 1. She was transfused 2 units PRBCs  5-19/5-20-16 for Hgb 7.8. Cycles 4 and 5 were both delayed due to thrombocytopenia. She completed chemotherapy 06-16-15 and vaginal brachytherapy30 gray in 5 fractions as of 09/14/2015. She did well thru end of 11-2015, including at gyn oncology exam 11-20-15, then presented to ED 12-29-15 with ~ 2 weeks progressive abdominal distension and pain, with large volume malignant ascites and large recurrent tumor burden. Cytology [id number] from ascites malignant cells consistent with gyn adenocarcinoma. She was hospitalized thru 01-07-16, with paracentesis for 4 liters bloody fluid on 1-28, for 3.5 liters on 01-02-16 and 4 liters on 01-09-16. Chemo was held 01-08-16 due to concerning exam, with bilateral LE venous dopplers no evidence of DVT and CT confirming large ascites (not appreciated on [place] 01-06-16) and bulky tumor without apparent bleed. Patient and family were counseled on very aggressive and advanced recurrent malignancy, understood that any treatment would be in attempt to control disease but not potential cure. Discussion then about life support and advance directives, specifically that such measures would not improve the underlying malignancy; patient and family wanted to consider that information. She requested attempt at treatment, had first adriamycin on 01-10-16 She was receiving PRBCs on 01-11-16 when developed respiratory distress, intubated, and has been supported in ICU with continuous dialysis and pressors.   Objective: Vital signs in last 24 hours: Blood pressure 103/53, pulse 85, temperature 97.3 F (36.3 C), temperature source Oral, resp. rate 11, height 5\' 2"  (1.575 m), weight 172 lb 9.9 oz (78.3 kg), SpO2 98 %. Awake, alert, conversation all appropriate, respirations not labored RA supine. PERRL. Mucous membranes pale, no lesions. Dialysis line and PAC sites ok. Lungs without wheezes or rales upper fields anteriorly. Heart RRR. Abdomen tightly distended, quiet. LE 2+ swelling with SCD on. No tremors  UE now. Some reddish  urine in foley tubing (recently placed bilateral stents)  Intake/Output from previous day: 02/12 0701 - 02/13 0700 In: 1143.3 [Schwartz.O.:415; I.V.:220.3; IV Piggyback:400] Out: 1186 [Urine:83; Emesis/NG output:150] Intake/Output this shift:      Lab Results:  Recent Labs  01/14/16 0630 01/15/16 0415  WBC 16.0* 26.5*  HGB 8.8* 8.4*  HCT 25.0* 24.5*  PLT 89* 85*   BMET  Recent Labs  01/14/16 1630 01/15/16 0415  NA 135 136  K 4.0 3.8  CL 100* 100*  CO2 24 26  GLUCOSE 190* 109*  BUN 20 17  CREATININE 1.14* 0.97  CALCIUM 7.7* 7.6*    Studies/Results: No abdominal US report from 01-14-16 - family may have been referring to [place] study of 01-06-16, which reportedly did not show significant ascites (then large ascites by CT 2-6 and paracentesis for 4 liters on 2-7)  Assessment/Plan: 1.High grade endometrial carcinoma: rapidly recurrent in past ~ 4 weeks with large volume malignant ascites and extensive, bulky metastatic disease in abdomen and pelvis. 3 large volume paracenteses with bloody malignant ascites thru 01-09-16. Received first adriamycin on 01-10-16, in attempt to improve/ control the disease, however this is not situation of potential cure. Would not expect tumor lysis with this tumor type or chemo. Platelet count dropping tho only a little lower today, WBC and ANC higher today with granix begun 2-12, not yet at nadir from the first adriamycin. Will continue granix due to high risk for chemo neutropenia from recent chemo. I have told family that benefit from this chemo likely will not be apparent for another 1-2 weeks at best and will have to get thru nadir counts also. Ascites is malignant. At time of next paracentesis SUGGEST PERITONEAL CATHETER placement to allow more frequent small volume drainage. [place] 01-06-16 may have been difficult to interpret due to extent of disease, and I expect significant ascites again now. She does not seem more symptomatic with this  today.  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  2.Acute hypoxic respiratory failure 01-11-16: has been extubated, breathing ok now. Large ascites was also restricting breathing prior to intubation. 3.acute renal failure: on continuous dialysis, K better. Bilateral ureteral stents placed 01-13-16,  minimal urine output now. Nephrology following 4.anemia with bloody malignant ascites. Iron studies last in Aug, will repeat 5.Diabetes on oral agents PTA 6.Severe protein calorie malnutrition. TNA would not help underlying malignancy, but may tolerate feedings per GI tract 7. Hyponatremia and electrolyte abnormalities including marked hyperkalemia on admission all improved with interventions since admission  8.extensive left flank bruising but no retroperitoneal bleeding by scan 9.no LE DVTs by doppler 01-09-16 10.flu vaccine 01-08-16 11. Thrombocytopenia: timing not unexpected for recent chemotherapy. No overt bleeding, seems ok to continue SQ heparin for now, tho would need to hold if bleeding or much further drop in platelets 12.Limited code more appropriate than full code in situation of underlying advanced malignancy. If worsens, DNR and comfort care would also be appropriate given underlying situation. Family is very appreciative of care by CCM, nephrology, urology and do seem to understand gravity of situation.  Note nephew is pharmacist in [employer] and one daughter is Secondary school teacher in [place].  WIll follow. Please page if I can assist between rounds [phone]   Brittany Schwartz

## 2016-01-16 ENCOUNTER — Inpatient Hospital Stay (HOSPITAL_COMMUNITY): Payer: BLUE CROSS/BLUE SHIELD

## 2016-01-16 DIAGNOSIS — Z7189 Other specified counseling: Secondary | ICD-10-CM | POA: Diagnosis present

## 2016-01-16 DIAGNOSIS — D6959 Other secondary thrombocytopenia: Secondary | ICD-10-CM

## 2016-01-16 DIAGNOSIS — R180 Malignant ascites: Secondary | ICD-10-CM

## 2016-01-16 LAB — RENAL FUNCTION PANEL
ALBUMIN: 1.7 g/dL — AB (ref 3.5–5.0)
ANION GAP: 10 (ref 5–15)
Albumin: 1.7 g/dL — ABNORMAL LOW (ref 3.5–5.0)
Anion gap: 10 (ref 5–15)
BUN: 13 mg/dL (ref 6–20)
BUN: 13 mg/dL (ref 6–20)
CALCIUM: 7.7 mg/dL — AB (ref 8.9–10.3)
CHLORIDE: 100 mmol/L — AB (ref 101–111)
CO2: 26 mmol/L (ref 22–32)
CO2: 25 mmol/L (ref 22–32)
CREATININE: 0.9 mg/dL (ref 0.44–1.00)
Calcium: 7.6 mg/dL — ABNORMAL LOW (ref 8.9–10.3)
Chloride: 102 mmol/L (ref 101–111)
Creatinine, Ser: 1.02 mg/dL — ABNORMAL HIGH (ref 0.44–1.00)
GFR calc Af Amer: >60 mL/min (ref >60)
GFR calc non Af Amer: 56 mL/min — ABNORMAL LOW (ref >60)
GFR calc non Af Amer: >60 mL/min (ref >60)
GLUCOSE: 225 mg/dL — AB (ref 65–99)
GLUCOSE: 177 mg/dL — AB (ref 65–99)
POTASSIUM: 3.6 mmol/L (ref 3.5–5.1)
POTASSIUM: 3.5 mmol/L (ref 3.5–5.1)
Phosphorus: 2.5 mg/dL (ref 2.5–4.6)
Phosphorus: 2 mg/dL — ABNORMAL LOW (ref 2.5–4.6)
Sodium: 136 mmol/L (ref 135–145)
Sodium: 137 mmol/L (ref 135–145)

## 2016-01-16 LAB — IRON AND TIBC
IRON: 83 ug/dL (ref 28–170)
SATURATION RATIOS: 72 % — AB (ref 10.4–31.8)
TIBC: 115 ug/dL — AB (ref 250–450)
UIBC: 32 ug/dL

## 2016-01-16 LAB — CBC WITH DIFFERENTIAL/PLATELET
BASOS PCT: 0 %
Basophils Absolute: 0 10*3/uL (ref 0.0–0.1)
EOS PCT: 0 %
Eosinophils Absolute: 0 10*3/uL (ref 0.0–0.7)
HEMATOCRIT: 23.7 % — AB (ref 36.0–46.0)
HEMOGLOBIN: 7.9 g/dL — AB (ref 12.0–15.0)
LYMPHS PCT: 2 %
Lymphs Abs: 0.4 10*3/uL — ABNORMAL LOW (ref 0.7–4.0)
MCH: 30.5 pg (ref 26.0–34.0)
MCHC: 33.3 g/dL (ref 30.0–36.0)
MCV: 91.5 fL (ref 78.0–100.0)
MONOS PCT: 0 %
Monocytes Absolute: 0 10*3/uL — ABNORMAL LOW (ref 0.1–1.0)
NEUTROS ABS: 17.2 10*3/uL — AB (ref 1.7–7.7)
Neutrophils Relative %: 98 %
Platelets: 65 10*3/uL — ABNORMAL LOW (ref 150–400)
RBC: 2.59 MIL/uL — ABNORMAL LOW (ref 3.87–5.11)
RDW: 14.2 % (ref 11.5–15.5)
WBC: 17.6 10*3/uL — ABNORMAL HIGH (ref 4.0–10.5)

## 2016-01-16 LAB — GLUCOSE, CAPILLARY
GLUCOSE-CAPILLARY: 301 mg/dL — AB (ref 65–99)
Glucose-Capillary: 236 mg/dL — ABNORMAL HIGH (ref 65–99)
Glucose-Capillary: 162 mg/dL — ABNORMAL HIGH (ref 65–99)
Glucose-Capillary: 157 mg/dL — ABNORMAL HIGH (ref 65–99)

## 2016-01-16 LAB — MAGNESIUM: Magnesium: 2.3 mg/dL (ref 1.7–2.4)

## 2016-01-16 NOTE — Progress Notes (Signed)
Daily Progress Note   Patient Name: Brittany Schwartz       Date: 01/16/2016 DOB: [birth year]  Age: 65 y.o. MRN#: [id number] Attending Physician: [name], MD Primary Care Physician: [name], MD Admit Date: 01/11/2016  Reason for Consultation/Follow-up: Establishing goals of care  Subjective:  weak appearing lady resting in bed, complains of mild to moderate abdominal distress.   Family meeting: Met with the patient's husband, 2 daughters, son in law and cousin in the patient's room and then subsequent discussions outside the patient's room as well.  Reviewed the patient's current hospitalization course.  Discussed about the patient's current level of ICU support required with CVVHD and pressor support. Discussed underlying endometrial cancer.  Family is appreciative of renal, critical care and Dr Mariana Kaufman follow up.  Goals of care discussions undertaken Introduced scope of comfort measures and discussed in great detail. All questions answered to the best of my ability. Discussed the patient's acute critical nature, her high likelihood for further clinical decompensation, her lower chances of meaningful recovery.  Family states that it is important for them to avoid the patient's suffering. They also stated that they would like to "not prolong the inevitable." discussed the serious nature of the patient's malignancy, her overall poor functional status and her not being a dialysis candidate.  Discussed about appropriate judicious use of opioids and benzodiazepines for comfort measures.   PLAN: Family wishes to have additional conversations with patient and amongst themselves today. Palliative provider Dr Domingo Cocking will follow up with them between 1-2 PM on 01-17-16. At that time, if  the patient/ family elects full scope of comfort measures, then crrt, pressors, abx will be d/c and end of life order set will be filled out, patient will likely move out of the icu to floor. If family/patient not ready to proceed with comfort care in the next 24 hours, then time trial of current therapies will continue and palliative will continue to follow up for additional conversations and for supportive care.     Length of Stay: 5 days  Current Medications: Scheduled Meds:  . antiseptic oral rinse  7 mL Mouth Rinse BID  . heparin  5,000 Units Subcutaneous 3 times per day  . insulin aspart  0-5 Units Subcutaneous QHS  . insulin aspart  0-9 Units Subcutaneous TID WC  .  latanoprost  1 drop Both Eyes QHS  . pantoprazole  40 mg Oral Daily  . piperacillin-tazobactam  3.375 g Intravenous 4 times per day  . sodium chloride flush  10-40 mL Intracatheter Q12H  . Tbo-filgastrim (GRANIX) SQ  300 mcg Subcutaneous Daily  . timolol  1 drop Both Eyes BID  . vancomycin  1,000 mg Intravenous Q24H    Continuous Infusions: . norepinephrine (LEVOPHED) Adult infusion 4 mcg/min (01/16/16 1500)  . dialysis replacement fluid (prismasate) 500 mL/hr at 01/16/16 1256  . dialysis replacement fluid (prismasate) 300 mL/hr at 01/16/16 0419  . dialysate (PRISMASATE) 1,500 mL/hr at 01/16/16 1632    PRN Meds: sodium chloride, fentaNYL (SUBLIMAZE) injection, heparin, heparin, sodium chloride flush  Physical Exam: Physical Exam             Weak lady resting in bed ? Some what confused today, does not verbalize much Shallow breathing S1 S2 Abdomen distended Trace edema No coolness no mottling  Vital Signs: BP 134/55 mmHg  Pulse 91  Temp(Src) 96.6 F (35.9 C) (Axillary)  Resp 28  Ht 5' 2"  (1.575 m)  Wt 75.8 kg (167 lb 1.7 oz)  BMI 30.56 kg/m2  SpO2 99% SpO2: SpO2: 99 % O2 Device: O2 Device: Not Delivered O2 Flow Rate: O2 Flow Rate (L/min): 2 L/min  Intake/output summary:  Intake/Output Summary  (Last 24 hours) at 01/16/16 1635 Last data filed at 01/16/16 1500  Gross per 24 hour  Intake 1850.08 ml  Output   1904 ml  Net -53.92 ml   LBM: Last BM Date: 01/15/16 Baseline Weight: Weight: 78 kg (171 lb 15.3 oz) Most recent weight: Weight: 75.8 kg (167 lb 1.7 oz)       Palliative Assessment/Data: Flowsheet Rows        Most Recent Value   Intake Tab    Referral Department  Hospitalist   Unit at Time of Referral  ICU   Palliative Care Primary Diagnosis  Cancer   Date Notified  01/14/16   Palliative Care Type  New Palliative care   Reason for referral  Clarify Goals of Care   Date of Admission  01/11/16   Date first seen by Palliative Care  01/15/16   # of days Palliative referral response time  1 Day(s)   # of days IP prior to Palliative referral  3   Clinical Assessment    Palliative Performance Scale Score  20%   Pain Max last 24 hours  4   Pain Min Last 24 hours  3   Dyspnea Max Last 24 Hours  4   Dyspnea Min Last 24 hours  3   Psychosocial & Spiritual Assessment    Palliative Care Outcomes    Patient/Family meeting held?  Yes   Who was at the meeting?  daughter, patient, patient's sister, patient's son in law.    Palliative Care Outcomes  Clarified goals of care   Palliative Care follow-up planned  Yes, Facility      Additional Data Reviewed: CBC    Component Value Date/Time   WBC 17.6* 01/16/2016 0400   WBC 15.2* 01/10/2016 1323   RBC 2.59* 01/16/2016 0400   RBC 2.99* 01/10/2016 1323   HGB 7.9* 01/16/2016 0400   HGB 8.6* 01/10/2016 1323   HCT 23.7* 01/16/2016 0400   HCT 26.8* 01/10/2016 1323   PLT 65* 01/16/2016 0400   PLT 245 01/10/2016 1323   MCV 91.5 01/16/2016 0400   MCV 89.6 01/10/2016 1323   MCH 30.5 01/16/2016  0400   MCH 28.7 01/10/2016 1323   MCHC 33.3 01/16/2016 0400   MCHC 32.1 01/10/2016 1323   RDW 14.2 01/16/2016 0400   RDW 13.5 01/10/2016 1323   LYMPHSABS 0.4* 01/16/2016 0400   LYMPHSABS 0.7* 01/10/2016 1323   MONOABS 0.0* 01/16/2016  0400   MONOABS 1.3* 01/10/2016 1323   EOSABS 0.0 01/16/2016 0400   EOSABS 0.0 01/10/2016 1323   BASOSABS 0.0 01/16/2016 0400   BASOSABS 0.0 01/10/2016 1323    CMP     Component Value Date/Time   NA 136 01/16/2016 0400   NA 122 Repeated and Verified* 01/08/2016 1132   K 3.6 01/16/2016 0400   K 4.7 01/08/2016 1132   CL 100* 01/16/2016 0400   CO2 26 01/16/2016 0400   CO2 16* 01/08/2016 1132   GLUCOSE 225* 01/16/2016 0400   GLUCOSE 260* 01/08/2016 1132   BUN 13 01/16/2016 0400   BUN 30.0* 01/08/2016 1132   CREATININE 1.02* 01/16/2016 0400   CREATININE 1.6* 01/08/2016 1132   CALCIUM 7.6* 01/16/2016 0400   CALCIUM 8.3* 01/08/2016 1132   PROT 5.7* 01/11/2016 1652   PROT 5.1* 01/08/2016 1132   ALBUMIN 1.7* 01/16/2016 0400   ALBUMIN 1.7* 01/08/2016 1132   AST 25 01/11/2016 1652   AST 16 01/08/2016 1132   ALT 14 01/11/2016 1652   ALT 11 01/08/2016 1132   ALKPHOS 93 01/11/2016 1652   ALKPHOS 73 01/08/2016 1132   BILITOT 0.9 01/11/2016 1652   BILITOT 0.71 01/08/2016 1132   GFRNONAA 56* 01/16/2016 0400   GFRAA >60 01/16/2016 0400       Problem List:  Patient Active Problem List   Diagnosis Date Noted  . Acute respiratory failure with hypoxemia ([place])   . Delirium   . Acute renal failure ([place])   . Septic shock ([place])   . Encounter for palliative care   . Hyperkalemia 01/11/2016  . Dyspnea   . Encounter for intubation   . SOB (shortness of breath)   . Acute respiratory failure with hypoxia ([place])   . Malignant neoplasm of uterus ([place])   . Endometrial cancer, FIGO stage IVB ([place]) 01/09/2016  . Protein-calorie malnutrition, severe ([place]) 01/09/2016  . Hypoalbuminemia 01/09/2016  . AKI (acute kidney injury) ([place]) 01/04/2016  . Elevated lactic acid level 01/04/2016  . Malignant ascites   . Non-insulin dependent type 2 diabetes mellitus ([place])   . Metastatic cancer ([place]) 12/29/2015  . Ascites 12/29/2015  . Hydronephrosis, left 12/29/2015  . Abdominal distension 12/29/2015    . Antineoplastic chemotherapy induced anemia 08/15/2015  . Esophageal reflux 05/06/2015  . 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 Anemia associated with chemotherapy 04/14/2015  . Chemotherapy induced neutropenia ([place]) 03/05/2015  . Absolute anemia 02/24/2015  . Type 2 diabetes mellitus without complication ([place]) 57/84/6962  . Essential hypertension 02/24/2015  . Glaucoma 02/12/2015  . Anemia 02/12/2015  . Hypochloremia 02/12/2015  . Hyponatremia 02/12/2015  . Dysuria 02/03/2015  . Uterine cancer ([place]) 01/19/2015  . Endometrial cancer ([place]) 01/09/2015  . Diabetes mellitus ([place]) 01/09/2015  . Hypertension 01/09/2015  . Hypercholesterolemia 01/09/2015     Palliative Care Assessment & Plan    1.Code Status:  Limited code    Code Status Orders        Start     Ordered   01/11/16 2106  Limited resuscitation (code)   Continuous    Question Answer Comment  In the event of cardiac or respiratory ARREST: Initiate Code Blue, Call Rapid Response No   In  the event of cardiac or respiratory ARREST: Perform CPR No   In the event of cardiac or respiratory ARREST: Perform Intubation/Mechanical Ventilation Yes   In the event of cardiac or respiratory ARREST: Use NIPPV/BiPAp only if indicated Yes   In the event of cardiac or respiratory ARREST: Administer ACLS medications if indicated Yes   In the event of cardiac or respiratory ARREST: Perform Defibrillation or Cardioversion if indicated No      01/11/16 2107    Code Status History    Date Active Date Inactive Code Status Order ID Comments User Context   01/11/2016  8:26 PM 01/11/2016  9:07 PM Partial Code [id number]  Corey Harold, NP ED   01/05/2016  1:38 AM 01/07/2016  7:04 PM Full Code [id number]  Edwin Dada, MD Inpatient   12/29/2015  4:27 PM 01/04/2016  4:59 PM Full Code [id number]  Domenic Polite, MD ED   01/19/2015  8:22 PM 01/20/2015  4:39 PM Full Code [id number]  Lahoma Crocker, MD Inpatient        2. Goals of Care/Additional Recommendations:     Desire for further Chaplaincy support:no  Psycho-social Needs: Caregiving  Support/Resources  3. Symptom Management:      1. As above fentanyl iv prn   4. Palliative Prophylaxis:   Delirium Protocol  5. Prognosis: guarded regardless, however, could be as short as hours-days if full scope of comfort measures is established.   6. Discharge Planning:  Pending further discussions with family members, but it is plausible to consider the possibility of a hospital death if comfort measures are to be established and pressors/ CRRT is discontinued. This has been discussed with family.    Care plan was discussed with patient, son in law, 2 daughters, husband, cousin.   Thank you for allowing the Palliative Medicine Team to assist in the care of this patient.   Time In:  1530 Time Out: 1630 Total Time 60 min  Prolonged Time Billed  no        [id number] Loistine Chance, MD  01/16/2016, 4:35 PM  Please contact Palliative Medicine Team phone at [phone] for questions and concerns.

## 2016-01-16 NOTE — Progress Notes (Signed)
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  Now with rapid recurrent disease since mid January with large volume malignant ascites, bulky involvement pelvis and abdominal caking.  She is followed by Dr. Marko Plume with oncology and is currently undergoing chemo.  Family reports that oncologist feels as though there is hope for improvement with chemotherapy. She received last dose of chemotherapy 2/8. She had 2u PRBC transfusion 2/9 for symptomatic anemia.  During transfusion, she complained of SOB with diaphoresis.  She was subsequently sent to ED for further evaluation.  In ED, she was hypertensive and remained SOB.  She was therefore placed on BiPAP. CXR showed significant pulmonary edema. Laboratory assessment was significant for renal failure with K 7.2, Na 119 (chronically low), and SCr 3.61. She is also hyperglycemic with Glucose 369. Lab eval also notable for WBC 20.2. CT abdomen from a few days prior consistent with progressive mass size with compression of L ureter and L hydronephrosis. She was unable to tolerate BiPAP in ED and required intubation.  PCCM asked to admit.  Extensive discussion with family by Dr. Titus Mould.  Given [name]'s hx and current circumstances, decision made to limit code status to intubation and vasopressors only.  In the event of cardiac arrest, we would not put her through CPR or defibrillation.  Full medical management otherwise.  STUDIES:  TTE 1/31:  LV EF 65-70%. Normal wall motion. Grade 1 diastolic dysfunction. LA & RA normal in size. RV normal in size & function. PASP 21 mmHg. Normal aortic & mitral  valves. No pericardial effusion. Port CXR 2/11: No opacity. ETT in good position. R IJ CVL in good position.  TTE 2/11:  EF 65-70% w/ normal wall motion. Grade 1 diastolic dysfunction. RV normal in size & function. No effusion.  MICROBIOLOGY: Blood Ctx x2 2/9>> Urine Ctx 2/9:  Negative  Blood Ctx x2 2/3:  Negative  Urine Ctx 2/2:  Negative   ANTIBIOTICS: Vanc 02/09 > Zosyn 02/09 >  SIGNIFICANT EVENTS: 02/09 > admitted with acute hypoxic respiratory failure, multiple electrolyte derangements. 02/10 > started CVVHD 02/11 > ureteral stent placed 02/12 > extubated  LINES/TUBES: R IJ HD cath 02/09 > Foley 2/9 > R Chest Port 1/31 > OETT 7.5 02/09 - 2/12 OGT 2/9 - 2/12  SUBJECTIVE: No acute events overnight. Patient reports some abdominal discomfort and distention with oral intake. Denies any nausea or vomiting. Denies any dyspnea or cough. Continuing to require low-dose vasopressor infusion.  REVIEW OF SYSTEMS:  No fever or sweats. Patient does feel chilled. No chest pain or pressure.  VITAL SIGNS: BP 122/60 mmHg  Pulse 88  Temp(Src) 97.3 F (36.3 C) (Axillary)  Resp 21  Ht 5\' 2"  (1.575 m)  Wt 167 lb 1.7 oz (75.8 kg)  BMI 30.56 kg/m2  SpO2 96%  HEMODYNAMICS:    VENTILATOR SETTINGS:    INTAKE / OUTPUT: I/O last 3 completed shifts: In: 2602.9 [P.O.:1391; I.V.:650.9; Other:61; IV Piggyback:500] Out: 3121 [Urine:73; [id number]  PHYSICAL EXAMINATION: General:  Awake. Alert. No distress. Family at bedside.  Integument:  Warm & dry. No rash on exposed skin.  HEENT:  No scleral injection. No oral ulcers. MMM.  Cardiovascular:  Regular rhythm. Lower extremity edema persists. Unable to appreciate JVD.  Pulmonary: Clear to auscultation bilaterally. Normal work of breathing on room air. Abdomen: Soft. Normal bowel sounds. Slightly more protuberant today.  Neurological:  Following commands. Grossly non-focal. More appropriate today.  LABS:  BMET  Recent Labs Lab  01/15/16 0415 01/15/16 1600 01/16/16 0400  NA 136 136 136  K 3.8 3.7 3.6  CL 100* 101 100*  CO2 26 25 26   BUN 17 16 13   CREATININE 0.97 0.98 1.02*  GLUCOSE 109* 173* 225*    Electrolytes  Recent Labs Lab 01/14/16 0630  01/15/16 0415 01/15/16 1600 01/16/16 0400  CALCIUM 7.5*  < > 7.6* 7.8* 7.6*  MG 2.4  --  2.4  --  2.3  PHOS 3.8  < > 3.0 2.5 2.5  < > = values in this interval not displayed.  CBC  Recent Labs Lab 01/14/16 0630 01/15/16 0415 01/16/16 0400  WBC 16.0* 26.5* 17.6*  HGB 8.8* 8.4* 7.9*  HCT 25.0* 24.5* 23.7*  PLT 89* 85* 65*    Coag's No results for input(s): APTT, INR in the last 168 hours.  Sepsis Markers  Recent Labs Lab 01/11/16 2109  LATICACIDVEN 3.0*    ABG  Recent Labs Lab 01/11/16 2201 01/12/16 0020 01/12/16 0353  PHART 7.201* 7.294* 7.400  PCO2ART 34.1* 33.2* 26.5*  PO2ART 391* 220* 152*    Liver Enzymes  Recent Labs Lab 01/11/16 1652  01/15/16 0415 01/15/16 1600 01/16/16 0400  AST 25  --   --   --   --   ALT 14  --   --   --   --   ALKPHOS 93  --   --   --   --   BILITOT 0.9  --   --   --   --   ALBUMIN 2.1*  < > 1.6* 1.7* 1.7*  < > = values in this interval not displayed.  Cardiac Enzymes  Recent Labs Lab 01/11/16 2205  TROPONINI 0.26*    Glucose  Recent Labs Lab 01/15/16 0427 01/15/16 0813 01/15/16 1132 01/15/16 1709 01/15/16 2147 01/16/16 0806  GLUCAP 122* 105* 184* 151* 174* 236*    Imaging No results found.  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 Successfully extubated 2/12. Respiratory failure progressively improving. Awaiting return of renal function. Continuing broad spectrum antibiotics for probable Sepsis but no source identified yet. Bedside ultrasound did not show a sufficient amount of fluid amenable to paracentesis on 2/12. Patient unable to get completely off pressors. Plan to readdress code status  tomorrow given mild delirium today.  1. Recurrent Endometrial Cancer:  Oncology following. Known malignant ascites. Patient s/p fist dose of chemotherapy recently. Trending cell counts daily with CBC. Fentanyl IV prn. 2. Acute Renal Failure:  Currently on CVVHD & tolerating well. Nephrology following. Assess daily for signs of recovery. Trending electrolytes & renal function twice daily. 3. Septic Shock: Still requiring low dose vasopressors. Awaiting blood culture results. Day #5 of empiric Vancomycin & Zosyn. 4. Malignant Ascites:  Consult IR for placement of a tube to allow drainage vs paracentesis. 5. Possible Adrenal Insufficiency:  Discontinued stress dose steroids after 5 days on 2/13. 6. Anemia/Thrombocytopenia:  No signs of active bleeding. Likely secondary to chemotherapy. Trending cell counts daily.  7. H/O DM:  BG controlled. Low dose SSI algorithm & accu-checks to qAC & HS. 8. Elevated Troponin I:  Likely demand ischemia  in setting of shock. No wall motion abnormalities on TTE to suggest MI. 9. Acute Hypoxic Respiratory Failure:  Resolved. Extubated 2/12. Continuing pulmonary toilette with IS. 10. Acute Encephalopathy:  Likely toxic metabolic vs delirium. Improving. 11. Hyponatremia:  Resolved. 12. Prophylaxis:  Heparin [place] q8hr while platelet count allows along with SCDs. Protonix PO daily. 13. Diet:  Continue diabetic diet. Consult nutrition for recommendations. 14. Disposition:  Patient will remain as long as she needs CVVHD & pressors.   Family Updates: Family updated at bedside by Dr. Ashok Cordia 2/14.  I have spent a total of 36 minutes of critical care time today caring for the patient, reviewing the patient's electronic medical record, and discussing plan of care with family at bedside.  Sonia Baller Ashok Cordia, M.D. [facility] Pager:  [phone] After 3pm or if no response, call [phone] 01/16/2016, 10:09 AM

## 2016-01-16 NOTE — Progress Notes (Signed)
Patient ID: Brittany Schwartz, female   DOB: 05/26/1951, 65 y.o.   MRN: [id number] Request was made for paracentesis.  Bedside US was completed that did show some fluid, a small amount, but her colon and other bowel were in the way prohibiting a safe stick.  Paracentesis was not performed.  Her daughter will be called per patient's request.  Valentino Saavedra E 3:11 PM 01/16/2016

## 2016-01-16 NOTE — Progress Notes (Signed)
MEDICAL ONCOLOGY January 16, 2016, 8:42 AM  Hospital day 6 Antibiotics: vanc zosyn Chemotherapy: first adriamycin 01-10-16 as salvage for recurrent endometrial carcinoma, day 7 cycle 1 today.   (adjuvant chemotherapy for IIIA high grade serous endometrial carcinoma completed 06-16-15)  Outpatient Physicians: [name]/ [name]; [name], MD (PCP, [facility]), Naoma Diener, [facility]  EMR reviewed, patient seen, daughter at bedside.  On CVVHD, continues levophed    Subjective: Awake and generally comfortable. Not SOB with minimal activity, on RA. Up in chair x 1 hr on 2-13, transferred bed to chair with assistance. Bowels moved on 2-13. Tolerating small amounts po due to early satiety from abdominal fullness, family bringing some soups from home (vegetarian), likes glucerna but has not had that yet. Denies pain. Swelling right hand and LE some better today, more swelling left hand on side of BP cuff and dialysis line - will elevate and suggest moving BP cuff. Only overt bleeding per family was blood in urine - discussed stents, thrombocytopenia, heparin. Able to sleep a little last pm.  Several family members have returned home to [place]; family still here taking turns at hospital.   [place] presented to Dr [name] 01-03-15 with vaginal spotting x 1 year. Pelvic US showed 33 mm endometrial stripe with ovaries normal, and endometrial biopsy 01-03-15 grade 3 endometrial adenocarcinoma. CT CAP 01-13-15 had negative chest, no adenopathy, mild fatty liver, markedly thickened endometrium and no evidence of metastatic disease outside of uterus. CA 125 preoperatively on 01-13-15 was 22.9. Surgery by Dr [name] 01-19-15 was robotic hysterectomy, BSO and resection of nodule at sigmoid serosa. At surgery there was milial tumor studding with 5-10 mm implants on bladder peritoneum and miliary disease on pelvic sidewalls, without upper abdominal disease evident, and  omentum appeared normal. 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 Recommendation for 6 cycles of taxol carboplatin, then consideration of external beam RT + vaginal brachytherapy if CR. First carboplatin taxol was given 02-16-2015; she was neutropenic with ANC 0.7 by day 15 cycle 1. She was transfused 2 units PRBCs 5-19/5-20-16 for Hgb 7.8. Cycles 4 and 5 were both delayed due to thrombocytopenia. She completed chemotherapy 06-16-15 and vaginal brachytherapy30 gray in 5 fractions as of 09/14/2015. She did well thru end of 11-2015, including at gyn oncology exam 11-20-15, then presented to ED 12-29-15 with ~ 2 weeks progressive abdominal distension and pain, with large volume malignant ascites and large recurrent tumor burden. Cytology [id number] from ascites malignant cells consistent with gyn adenocarcinoma. She was hospitalized thru 01-07-16, with paracentesis for 4 liters bloody fluid on 1-28, for 3.5 liters on 01-02-16 and 4 liters on 01-09-16. Chemo was held 01-08-16 due to concerning exam, with bilateral LE venous dopplers no evidence of DVT and CT confirming large ascites (not appreciated on [place] 01-06-16) and bulky tumor without apparent bleed. Patient and family were counseled on very aggressive and advanced recurrent malignancy, understood that any treatment would be in attempt to control disease but not potential cure. Discussion then about life support and advance directives, specifically that such measures would not improve the underlying malignancy; patient and family wanted to consider that information. She requested attempt at treatment, had first adriamycin on 01-10-16 She was receiving PRBCs on 01-11-16 when developed respiratory distress, intubated, and has been supported in ICU with continuous dialysis and pressors.   Objective: Vital signs in last 24  hours: Blood pressure 92/41, pulse 76, temperature 97.6 F (36.4 C), temperature source Oral, resp. rate 16, height 5\' 2"  (1.575 m), weight 167 lb 1.7 oz (75.8 kg), SpO2 97 %. Awake, alert, looks comfortable in bed at 45 degrees on RA. Oral mucosa moist, mucous membranes more pale, no lesions. PERRL, not icteric. Lungs clear anteriorly. PAC ok. No bleeding at dialysis line. Heart RRR no gallop. Abdomen not quite tight, not more distended than my exam last 2 days, not tender, quiet. LE less swollen bilaterally, still 2+. Feet warm. Skin without rash, petechiae. Moves all extremities, speech fluent and appropriate.   Intake/Output from previous day: 02/13 0701 - 02/14 0700 In: 2151.6 [P.O.:1221; I.V.:502.6; IV Piggyback:400] Out: 2589 [Urine:38] Intake/Output this shift: Total I/O In: 41.9 [I.V.:41.9] Out: 45 [Other:45]    Lab Results:  Recent Labs  01/15/16 0415 01/16/16 0400  WBC 26.5* 17.6*  HGB 8.4* 7.9*  HCT 24.5* 23.7*  PLT 85* 65*   BMET  Recent Labs  01/15/16 1600 01/16/16 0400  NA 136 136  K 3.7 3.6  CL 101 100*  CO2 25 26  GLUCOSE 173* 225*  BUN 16 13  CREATININE 0.98 1.02*  CALCIUM 7.8* 7.6*   Serum iron 83, %sat 32  Studies/Results: No results found. Abdominal US was bedside procedure. Note even radiology [place] on 2-4 did not clearly show ascites, tho 4 liters removed on 2-7.   Assessment/Plan: 1.High grade endometrial carcinoma: rapidly recurrent in past ~ 4 weeks with large volume malignant ascites and extensive, bulky metastatic disease in abdomen and pelvis. 3 large volume paracenteses with bloody malignant ascites thru 01-09-16. Received first adriamycin on 01-10-16, in attempt to improve/ control the disease, however this is not situation of potential cure.  Counts dropping including platelets today 65K,  not yet at nadir from the first adriamycin (day 7 cycle 1 adria today). Likely needs to hold prophylactic heparin, tho I will defer this to CCM and  nephrology give continuous dialysis. Will continue granix due to high risk for chemo neutropenia from recent chemo. I have told family that benefit from this chemo likely will not be apparent for another 1-2 weeks at best and will have to get thru nadir counts also. Ascites is malignant. At time of next paracentesis SUGGEST PERITONEAL CATHETER placement to allow more frequent small volume drainage. [place] may be difficult to interpret given extent of other tumor, but abdomen also not more distended today and she does not seem more symptomatic. Family aware that paracentesis, especially large volume, could drop blood pressure.   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  2.Acute hypoxic respiratory failure 01-11-16: breathing comfortable since extubation. Large ascites was also restricting breathing prior to intubation. 3.acute renal failure: on continuous dialysis, K better. Bilateral ureteral stents placed 01-13-16, minimal urine output now. Nephrology following 4.anemia with bloody malignant ascites. Iron studies today not markedly low 5.Diabetes on oral agents PTA 6.Severe protein calorie malnutrition. Recommended small amounts frequently, suggest glucerna (which she likes).  7. Hyponatremia and electrolyte abnormalities all improved on CVVHD  8.extensive left flank bruising but no retroperitoneal bleeding by scan 9.no LE DVTs by doppler 01-09-16 10.flu vaccine 01-08-16 11. Thrombocytopenia: timing expected from recent chemotherapy. No overt bleeding other than in urine, not unexpected with stents. Probably need to hold prophylactic heparin but will defer this to CCM and nephrology today. Expect  counts to continue to drop over next several days. 12.Limited code more appropriate than full code in situation of underlying  advanced malignancy. If worsens, DNR and comfort care would also be appropriate given underlying situation. Appreciate palliative care team following.   Please page me if I can help between my rounds   Pager [phone] Myrtie Leuthold P

## 2016-01-16 NOTE — Progress Notes (Addendum)
[facility] Progress Note   Subjective:  Tolerating  CRRT all 4K fluids, keeping volume even, no heparin, no filter problems Current UF rate 0-75 net neg/hour Weights variable - appears down from 78.3->75.8 past 24 hours No UOP at all today Very low dose norepi still on Getting [place] at present to see if fluid for paracentesis Advised her no improvement in kidney function "what can we do"? Advised doing all we can   Filed Vitals:   01/16/16 1300 01/16/16 1330 01/16/16 1400 01/16/16 1430  BP: 121/67 114/58 99/61 110/67  Pulse: 84 82 80 79  Temp: 96.6 F (35.9 C)     TempSrc: Axillary     Resp: 24 23 21 22   Height:      Weight:      SpO2: 98% 98% 98% 99%   Exam: BP 110/67 mmHg  Pulse 79  Temp(Src) 96.6 F (35.9 C) (Axillary)  Resp 22  Ht 5\' 2"  (1.575 m)  Wt 75.8 kg (167 lb 1.7 oz)  BMI 30.56 kg/m2  SpO2 99%  Awake, alert Supine, no oxygen No distress No jvd Line left IJ for CRRT (2/9) Chest clear  Regular, HR 80's Abd mod-severe ascites, nontender - very tight - + abd wall edema - currently getting abd [place] 2-3+ bilat LE pitting edema to hips not really changed   Inpatient medications: . antiseptic oral rinse  7 mL Mouth Rinse BID  . heparin  5,000 Units Subcutaneous 3 times per day  . insulin aspart  0-5 Units Subcutaneous QHS  . insulin aspart  0-9 Units Subcutaneous TID WC  . latanoprost  1 drop Both Eyes QHS  . pantoprazole  40 mg Oral Daily  . piperacillin-tazobactam  3.375 g Intravenous 4 times per day  . sodium chloride flush  10-40 mL Intracatheter Q12H  . Tbo-filgastrim (GRANIX) SQ  300 mcg Subcutaneous Daily  . timolol  1 drop Both Eyes BID  . vancomycin  1,000 mg Intravenous Q24H  Infusions: . norepinephrine (LEVOPHED) Adult infusion 2 mcg/min (01/16/16 1400)  . dialysis replacement fluid (prismasate) 500 mL/hr at 01/16/16 1256  . dialysis replacement fluid (prismasate) 300 mL/hr at 01/16/16 0419  . dialysate (PRISMASATE) 1,500 mL/hr  at 01/16/16 1300   Prn medications sodium chloride, fentaNYL (SUBLIMAZE) injection, heparin, heparin, sodium chloride flush    Recent Labs Lab 01/15/16 0415 01/15/16 1600 01/16/16 0400  NA 136 136 136  K 3.8 3.7 3.6  CL 100* 101 100*  CO2 26 25 26   GLUCOSE 109* 173* 225*  BUN 17 16 13   CREATININE 0.97 0.98 1.02*  CALCIUM 7.6* 7.8* 7.6*  PHOS 3.0 2.5 2.5    Recent Labs Lab 01/11/16 1652  01/15/16 0415 01/15/16 1600 01/16/16 0400  AST 25  --   --   --   --   ALT 14  --   --   --   --   ALKPHOS 93  --   --   --   --   BILITOT 0.9  --   --   --   --   PROT 5.7*  --   --   --   --   ALBUMIN 2.1*  < > 1.6* 1.7* 1.7*  < > = values in this interval not displayed.  Recent Labs Lab 01/14/16 0630 01/15/16 0415 01/16/16 0400  WBC 16.0* 26.5* 17.6*  NEUTROABS 15.6* 26.2* 17.2*  HGB 8.8* 8.4* 7.9*  HCT 25.0* 24.5* 23.7*  MCV 87.4 90.1 91.5  PLT 89*  85* 65*    CXR 2/11 > normal cxr CT chest 2/9 > perihilar edema, bibasilar consolidation L> R ECHO 1/31 > normal LV/ RV     Assessment: 1. AKI  (contrast/ hypotension/ L hydro/ larg vol paracentesis x 2) - anuric.  CRRT D#5. Marked anasarca of LE's and malignant ascites; clear CXR.  Exam not changed. 3rd spaced fluid primarily due to low alb 1.5, cap leak from malignancy. Has bilateral ureteral stents (2/11).  Not a candidate for long-term dialysis given severe comorbidities, family and patient are all aware.  No evidence for any return of renal function. K and phos are stable on CRRT.  Remains on a small dose of norepi.  Continue to try to pull some fluid if she will tolerate - goal range of 0-75 neg/hour so that nursing can pull if hourly BP's allow. Not sure what our endpoint is.  2. Metastatic endometrial cancer- recurrent, s/p adriamycin 2/8 as salvage for recurrent disease, not a situation of potential cure per oncology. Dr. Marko Plume recommends peritoneal catheter to help keep ascites decompressed. Last paracentesis was 2/11  - 4 liters. Getting [place] today at bedside re another paracentesis 3. Resp failure - remains on room air at the present time.  4. DM on oral agents 5. Malnutrition/ hypoalbuminemia 6. Anemia 7. Disposition - "time trial" of current management for 48 hours per last note of palliative care (2/13).  Limited code.   Jamal Maes, MD [facility] [phone] Pager 01/16/2016, 2:45 PM   Addendum: I erroneously stated past 2 days that pt is on all 4K fluids - her pre and post filter replacement fluids are actually 0K and dialysate is 4K. Serum K is fine so no change needed.  Jamal Maes, MD [facility] [phone] Pager 01/16/2016, 6:43 PM

## 2016-01-16 NOTE — Plan of Care (Signed)
Problem: Phase I Progression Outcomes Goal: Voiding-avoid urinary catheter unless indicated Outcome: Not Met (add Reason) Foley catheter placement for anuria/renal failure/CRRT     

## 2016-01-17 ENCOUNTER — Other Ambulatory Visit: Payer: Self-pay | Admitting: Oncology

## 2016-01-17 DIAGNOSIS — D6481 Anemia due to antineoplastic chemotherapy: Secondary | ICD-10-CM

## 2016-01-17 DIAGNOSIS — C801 Malignant (primary) neoplasm, unspecified: Secondary | ICD-10-CM

## 2016-01-17 LAB — CBC WITH DIFFERENTIAL/PLATELET
BASOS ABS: 0 10*3/uL (ref 0.0–0.1)
BASOS PCT: 0 %
EOS ABS: 0 10*3/uL (ref 0.0–0.7)
Eosinophils Relative: 0 %
HCT: 22.4 % — ABNORMAL LOW (ref 36.0–46.0)
Hemoglobin: 7.5 g/dL — ABNORMAL LOW (ref 12.0–15.0)
Lymphocytes Relative: 4 %
Lymphs Abs: 0.3 10*3/uL — ABNORMAL LOW (ref 0.7–4.0)
MCH: 30.6 pg (ref 26.0–34.0)
MCHC: 33.5 g/dL (ref 30.0–36.0)
MCV: 91.4 fL (ref 78.0–100.0)
MONO ABS: 0 10*3/uL — AB (ref 0.1–1.0)
Monocytes Relative: 0 %
NEUTROS PCT: 96 %
Neutro Abs: 8.2 10*3/uL — ABNORMAL HIGH (ref 1.7–7.7)
PLATELETS: 49 10*3/uL — AB (ref 150–400)
RBC: 2.45 MIL/uL — AB (ref 3.87–5.11)
RDW: 14 % (ref 11.5–15.5)
WBC: 8.5 10*3/uL (ref 4.0–10.5)

## 2016-01-17 LAB — RENAL FUNCTION PANEL
ALBUMIN: 1.7 g/dL — AB (ref 3.5–5.0)
ANION GAP: 8 (ref 5–15)
Albumin: 1.9 g/dL — ABNORMAL LOW (ref 3.5–5.0)
Anion gap: 8 (ref 5–15)
BUN: 13 mg/dL (ref 6–20)
BUN: 10 mg/dL (ref 6–20)
CALCIUM: 7.5 mg/dL — AB (ref 8.9–10.3)
CALCIUM: 7.9 mg/dL — AB (ref 8.9–10.3)
CO2: 26 mmol/L (ref 22–32)
CO2: 26 mmol/L (ref 22–32)
CREATININE: 0.74 mg/dL (ref 0.44–1.00)
Chloride: 101 mmol/L (ref 101–111)
Chloride: 102 mmol/L (ref 101–111)
Creatinine, Ser: 1.02 mg/dL — ABNORMAL HIGH (ref 0.44–1.00)
GFR calc non Af Amer: >60 mL/min (ref >60)
GFR, EST NON AFRICAN AMERICAN: 56 mL/min — AB (ref >60)
Glucose, Bld: 216 mg/dL — ABNORMAL HIGH (ref 65–99)
Glucose, Bld: 176 mg/dL — ABNORMAL HIGH (ref 65–99)
PHOSPHORUS: 1.9 mg/dL — AB (ref 2.5–4.6)
PHOSPHORUS: 1.7 mg/dL — AB (ref 2.5–4.6)
Potassium: 3.3 mmol/L — ABNORMAL LOW (ref 3.5–5.1)
Potassium: 4 mmol/L (ref 3.5–5.1)
SODIUM: 135 mmol/L (ref 135–145)
SODIUM: 136 mmol/L (ref 135–145)

## 2016-01-17 LAB — CULTURE, BLOOD (ROUTINE X 2)
CULTURE: NO GROWTH
CULTURE: NO GROWTH

## 2016-01-17 LAB — GLUCOSE, CAPILLARY
GLUCOSE-CAPILLARY: 207 mg/dL — AB (ref 65–99)
Glucose-Capillary: 184 mg/dL — ABNORMAL HIGH (ref 65–99)
Glucose-Capillary: 182 mg/dL — ABNORMAL HIGH (ref 65–99)
Glucose-Capillary: 182 mg/dL — ABNORMAL HIGH (ref 65–99)

## 2016-01-17 LAB — MAGNESIUM: MAGNESIUM: 2.4 mg/dL (ref 1.7–2.4)

## 2016-01-17 MED ORDER — GLUCERNA SHAKE PO LIQD
237.0000 mL | Freq: Three times a day (TID) | ORAL | Status: DC
  Administered 2016-01-17 – 2016-01-29 (×24): 237 mL via ORAL
  Filled 2016-01-17 (×42): qty 237

## 2016-01-17 MED ORDER — GLUCERNA PO LIQD: 237.0000 mL | Freq: Three times a day (TID) | ORAL | Status: DC

## 2016-01-17 MED ORDER — POTASSIUM CHLORIDE 10 MEQ/100ML IV SOLN
10.0000 meq | INTRAVENOUS | Status: AC
  Administered 2016-01-17 (×2): 10 meq via INTRAVENOUS
  Filled 2016-01-17 (×2): qty 100

## 2016-01-17 MED ORDER — PHENOL 1.4 % MT LIQD
1.0000 | OROMUCOSAL | Status: DC | PRN
  Administered 2016-01-17 – 2016-01-25 (×4): 1 via OROMUCOSAL
  Filled 2016-01-17 (×2): qty 177

## 2016-01-17 MED ORDER — PRISMASOL BGK 4/2.5 32-4-2.5 MEQ/L IV SOLN
INTRAVENOUS | Status: DC
  Administered 2016-01-17 – 2016-01-22 (×8): via INTRAVENOUS_CENTRAL
  Filled 2016-01-17 (×8): qty 5000

## 2016-01-17 MED ORDER — PRISMASOL BGK 4/2.5 32-4-2.5 MEQ/L IV SOLN
INTRAVENOUS | Status: DC
  Administered 2016-01-17 – 2016-01-22 (×12): via INTRAVENOUS_CENTRAL
  Filled 2016-01-17 (×13): qty 5000

## 2016-01-17 MED ORDER — SODIUM PHOSPHATE 3 MMOLE/ML IV SOLN
10.0000 mmol | Freq: Once | INTRAVENOUS | Status: AC
  Administered 2016-01-17: 10 mmol via INTRAVENOUS
  Filled 2016-01-17: qty 3.33

## 2016-01-17 NOTE — Progress Notes (Signed)
Date: January 17, 2016 Chart reviewed for concurrent status and case management needs. Will continue to follow patient for changes and needs:  Remains hypotensive and iv Levophed Velva Harman, BSN, [place], [place]

## 2016-01-17 NOTE — Progress Notes (Signed)
Palliative care meeting rescheduled for 01/18/16, at 1:30 pm.

## 2016-01-17 NOTE — Progress Notes (Signed)
MEDICAL ONCOLOGY January 17, 2016, 1:47 PM  EMR reviewed, spoke on unit with Dr Lorrene Reid and RN. Pateint seen, with husband and another family member at bedside.  I believe another family member in ICU at [address] [facility], husband tells me "family problems" have necessitated delay in palliative care meeting.  RN has alerted me to family's request that "nothing negative" be said in patient's presence.  Hospital day 7 Antibiotics: vanc zosyn Chemotherapy: first adriamycin 01-10-16 as salvage for recurrent endometrial carcinoma, day 8 cycle 1 today.  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 Throat feels better when she sips Ensure, encouraged.  Has taken only bites and sips po, early satiety as she has had with abdominal tumor and ascites. Denies discomfort from dialysis lines left neck. Denies SOB supine on RA now. No cough. Denies bleeding. States small BM again today (?). Denies worsening discomfort abdomen. Left hand not painful, reminded her to keep it elevated.  Objective: Vital signs in last 24 hours: Blood pressure 120/59, pulse 90, temperature 97.2 F (36.2 C), temperature source Oral, resp. rate 27, height 5\' 2"  (1.575 m), weight 167 lb 12.3 oz (76.1 kg), SpO2 97 %. Looks comfortable supine on RA. No bleeding at central line left neck or PAC. Oral mucosa and posterior pharynx clear and moist, no thrush. PERRL. Lungs without wheezes or rales anteriorly. Heart RRR. Abdomen tightly distended, active BS, not tender on exam. LE less swelling lower legs and feet which are elevated. Feet warm. Left hand still swollen unchanged, not tender, no cords LUE. Moves all extremities  Intake/Output from previous day: 02/14 0701 - 02/15 0700 In: 2080 [P.O.:925; I.V.:755; IV Piggyback:400] Out: 2058 [Urine:30] Intake/Output this shift: Total I/O In: 466.9 [P.O.:50; I.V.:166.9; IV  Piggyback:250] Out: 581 [Urine:5; Other:576]    Lab Results:  Recent Labs  01/16/16 0400 01/17/16 0510  WBC 17.6* 8.5  HGB 7.9* 7.5*  HCT 23.7* 22.4*  PLT 65* 49*   BMET  Recent Labs  01/16/16 1600 01/17/16 0510  NA 137 135  K 3.5 3.3*  CL 102 101  CO2 25 26  GLUCOSE 177* 216*  BUN 13 13  CREATININE 0.90 1.02*  CALCIUM 7.7* 7.5*    Studies/Results: Bedside US by radiology:  US Abdomen Limited  01/16/2016  CLINICAL DATA:  Current history of metastatic endometrial cancer with peritoneal and omental metastatic disease. Abdominal distension. Evaluate for possible paracentesis. EXAM: LIMITED ABDOMEN ULTRASOUND FOR ASCITES TECHNIQUE: Limited ultrasound survey for ascites was performed in all four abdominal quadrants. COMPARISON:  CT abdomen and pelvis 01/11/2016. FINDINGS: A small amount of ascites is present in all 4 quadrants of the abdomen, though none of the pockets of fluid are large enough for paracentesis currently. IMPRESSION: Small amount of ascites. None of the pockets of fluid are large enough for paracentesis currently. Electronically Signed   By: Evangeline Dakin M.D.   On: 01/16/2016 16:00   MEDICATIONS reviewed: SQ heparin DCd with present thrombocytopenia.  Assessment/Plan: 1.High grade endometrial carcinoma: rapidly recurrent in past ~ 4 weeks with large volume malignant ascites and extensive, bulky metastatic disease in abdomen and pelvis. 3 large volume paracenteses with bloody malignant ascites thru 01-09-16. Received first adriamycin on 01-10-16, in attempt to improve/ control the disease, however this is not situation of potential cure. BY [place] 01-16-16 not enough ascites in appropriate location for paracentesis. Counts continue to drop from recent chemotherapy, likely not at nadir  as she is just day 8 cycle 1 adriamycin today. Agree with holding prophylactic heparin for platelets today 49K (chemo thrombocytopenia). On granix, continue. Would transfuse PRBCs if  symptomatic/ if continuing this level of support, anemia related to chemo, bloody ascites, renal failure.  I have told family that benefit from this chemo likely will not be apparent for another 1-2 weeks at best and will have to get thru nadir counts also. If chemo continues,  due #2 adriamycin on 01-31-16.  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  2.Acute hypoxic respiratory failure 01-11-16: breathing comfortable since extubation. Large ascites was also restricting breathing prior to intubation. 3.acute renal failure: on continuous dialysis. Bilateral ureteral stents placed 01-13-16, stillminimal urine output, certainly third spacing significant amount. Appreciate Dr Sanda Klein help today 4.anemia with bloody malignant ascites. Iron studies today not markedly low 5.Diabetes on oral agents PTA 6.Severe protein calorie malnutrition. Recommended small amounts frequently, suggested sips of glucerna every 30 min while awake (which she likes).  7. Hyponatremia and electrolyte abnormalities all improved on CVVHD  8.extensive left flank bruising but no retroperitoneal bleeding by scan 9.no LE DVTs by doppler 01-09-16. Use SCDs while holding prophylactic heparin 10.flu vaccine 01-08-16 11..Limited code more appropriate than full code in situation of underlying advanced malignancy. If worsens, DNR and comfort care would also be appropriate given underlying situation. Appreciate palliative care team following.   Please page me if I can assist between my rounds   Pager [phone]  Chayna Surratt P

## 2016-01-17 NOTE — Progress Notes (Signed)
Brittany Schwartz Progress Note   Subjective:  Tolerating  CRRT  4K dialysate/0K replacement fluids K now low at 3.3 Current UF rate 0-75 net neg/hour Weight 76.1 no weight lost past 24 hours Oligoanuric Norepi at 2 Paracentesis could not be done yesterday  Filed Vitals:   01/17/16 1215 01/17/16 1230 01/17/16 1245 01/17/16 1300  BP: 93/46 102/46 104/39 120/59  Pulse: 84 82 84 90  Temp:   97.2 F (36.2 C)   TempSrc:   Oral   Resp: 24 21 24 27   Height:      Weight:      SpO2: 96% 96% 95% 97%   Exam: BP 120/59 mmHg  Pulse 90  Temp(Src) 97.2 F (36.2 C) (Oral)  Resp 27  Ht 5\' 2"  (1.575 m)  Wt 76.1 kg (167 lb 12.3 oz)  BMI 30.68 kg/m2  SpO2 97%  Awake, alert Supine, no oxygen No distress No jvd Line left IJ for CRRT (2/9) Chest clear  Regular, HR 80's Abd mod-severe ascites, nontender - very tight - + abd wall edema - pain with palpation 2-3+ bilat LE pitting edema to hips not really changed   Inpatient medications: . antiseptic oral rinse  7 mL Mouth Rinse BID  . feeding supplement (GLUCERNA SHAKE)  237 mL Oral TID BM  . insulin aspart  0-5 Units Subcutaneous QHS  . insulin aspart  0-9 Units Subcutaneous TID WC  . latanoprost  1 drop Both Eyes QHS  . pantoprazole  40 mg Oral Daily  . piperacillin-tazobactam  3.375 g Intravenous 4 times per day  . sodium chloride flush  10-40 mL Intracatheter Q12H  . Tbo-filgastrim (GRANIX) SQ  300 mcg Subcutaneous Daily  . timolol  1 drop Both Eyes BID  . vancomycin  1,000 mg Intravenous Q24H  Infusions: . norepinephrine (LEVOPHED) Adult infusion 2 mcg/min (01/17/16 1300)  . dialysis replacement fluid (prismasate) 500 mL/hr at 01/17/16 0932  . dialysis replacement fluid (prismasate) 300 mL/hr at 01/16/16 2233  . dialysate (PRISMASATE) 1,500 mL/hr at 01/17/16 1243   Prn medications sodium chloride, fentaNYL (SUBLIMAZE) injection, heparin, heparin, phenol, sodium chloride flush    Recent Labs Lab  01/16/16 0400 01/16/16 1600 01/17/16 0510  NA 136 137 135  K 3.6 3.5 3.3*  CL 100* 102 101  CO2 26 25 26   GLUCOSE 225* 177* 216*  BUN 13 13 13   CREATININE 1.02* 0.90 1.02*  CALCIUM 7.6* 7.7* 7.5*  PHOS 2.5 2.0* 1.9*    Recent Labs Lab 01/11/16 1652  01/16/16 0400 01/16/16 1600 01/17/16 0510  AST 25  --   --   --   --   ALT 14  --   --   --   --   ALKPHOS 93  --   --   --   --   BILITOT 0.9  --   --   --   --   PROT 5.7*  --   --   --   --   ALBUMIN 2.1*  < > 1.7* 1.7* 1.7*  < > = values in this interval not displayed.  Recent Labs Lab 01/15/16 0415 01/16/16 0400 01/17/16 0510  WBC 26.5* 17.6* 8.5  NEUTROABS 26.2* 17.2* 8.2*  HGB 8.4* 7.9* 7.5*  HCT 24.5* 23.7* 22.4*  MCV 90.1 91.5 91.4  PLT 85* 65* 49*    CXR 2/11 > normal cxr CT chest 2/9 > perihilar edema, bibasilar consolidation L> R ECHO 1/31 > normal LV/ RV  Assessment: 1. AKI  (contrast/ hypotension/ L hydro/ larg vol paracentesis x 2) - anuric.  CRRT D#6. Marked anasarca of LE's and malignant ascites; clear CXR.  Exam not changed. 3rd spaced fluid primarily due to low alb 1.5, cap leak from malignancy. Bilateral ureteral stents in place (2/11).  Not a candidate for long-term dialysis given severe comorbidities, family and patient are all aware (our partner Dr. Elmarie Shiley has also spoken with family member about non-candidacy for long term dialysis).  No evidence for any return of renal function.   Remains on a small dose of norepi.  Continue to try to pull some fluid if she will tolerate - goal range of 0-75 neg/hour so that nursing can pull if hourly BP's allow. K runs X2 and change all fluids to 4K. 10 mmoles sodium phos for P of 1.9.   2. Metastatic endometrial cancer- recurrent, s/p adriamycin 2/8 as salvage for recurrent disease, not a situation of potential cure per oncology. Dr. Marko Plume recommends peritoneal catheter to help keep ascites decompressed. Last paracentesis was 2/11 - 4 liters. [place] yesterday  yielded no safe sites for paracentesis. 3. Resp failure - remains on room air at the present time.  4. DM on oral agents 5. Malnutrition/ hypoalbuminemia 6. Anemia 7. Disposition - limited code and family meeting scheduled for tomorrow.  Jamal Maes, MD [place] Pager 01/17/2016, 1:17 PM

## 2016-01-17 NOTE — Progress Notes (Signed)
Daughter requests that MDs and other staff do not discuss suspected poor outcome in front of, or with the patient.

## 2016-01-17 NOTE — Progress Notes (Signed)
Left message at palliative care office, to inform MD that patient's family will be unable to attend 1:30 meeting.  Family would like to meet 3:00 pm or later.

## 2016-01-17 NOTE — Progress Notes (Signed)
PULMONARY / CRITICAL CARE MEDICINE   Name: Brittany Schwartz MRN: [id number] DOB: Mar 23, 1951    ADMISSION DATE:  01/11/2016 CONSULTATION DATE:  01/11/2016  REFERRING MD:  Brittany Schwartz  CHIEF COMPLAINT:  Respiratory distress  HISTORY OF PRESENT ILLNESS:   65 year old female with PMH as below, which includes grade 3 serous/ endometrioid endometrial carcinoma IIIA at diagnosis, adjuvant chemo completed 06/16/15.  Now with rapid recurrent disease since mid January with large volume malignant ascites, bulky involvement pelvis and abdominal caking.  She is followed by Brittany Schwartz with oncology and is currently undergoing chemo.  Family reports that oncologist feels as though there is hope for improvement with chemotherapy. She received last dose of chemotherapy 2/8. She had 2u PRBC transfusion 2/9 for symptomatic anemia.  During transfusion, she complained of SOB with diaphoresis.  She was subsequently sent to ED for further evaluation.  In ED, she was hypertensive and remained SOB.  She was therefore placed on BiPAP. CXR showed significant pulmonary edema. Laboratory assessment was significant for renal failure with K 7.2, Na 119 (chronically low), and SCr 3.61. She is also hyperglycemic with Glucose 369. Lab eval also notable for WBC 20.2. CT abdomen from a few days prior consistent with progressive mass size with compression of L ureter and L hydronephrosis. She was unable to tolerate BiPAP in ED and required intubation.  PCCM asked to admit.  Extensive discussion with family by Brittany Schwartz.  Given Brittany Schwartz's hx and current circumstances, decision made to limit code status to intubation and vasopressors only.  In the event of cardiac arrest, we would not put her through CPR or defibrillation.  Full medical management otherwise.  STUDIES:  TTE 1/31:  LV EF 65-70%. Normal wall motion. Grade 1 diastolic dysfunction. LA & RA normal in size. RV normal in size & function. PASP 21 mmHg. Normal aortic & mitral  valves. No pericardial effusion. Port CXR 2/11: No opacity. ETT in good position. R IJ CVL in good position.  TTE 2/11:  EF 65-70% w/ normal wall motion. Grade 1 diastolic dysfunction. RV normal in size & function. No effusion.  MICROBIOLOGY: Blood Ctx x2 2/9>> Urine Ctx 2/9:  Negative  Blood Ctx x2 2/3:  Negative  Urine Ctx 2/2:  Negative   ANTIBIOTICS: Vanc 02/09 > Zosyn 02/09 >  SIGNIFICANT EVENTS: 02/09 > admitted with acute hypoxic respiratory failure, multiple electrolyte derangements. 02/10 > started CVVHD 02/11 > ureteral stent placed 02/12 > extubated  LINES/TUBES: R IJ HD cath 02/09 > Foley 2/9 > R Chest Port 1/31 > OETT 7.5 02/09 - 2/12 OGT 2/9 - 2/12  SUBJECTIVE: No acute events overnight. IR unable to drain ascites yesterday. Patient continues to report abdominal distention. Denies any abdominal pain. Denies any dyspnea or cough. No nausea or vomiting. She is complaining of some sore throat.  REVIEW OF SYSTEMS:  No subjective fever. Patient is having some chills and requires warming blankets. No chest pain or pressure.  VITAL SIGNS: BP 113/55 mmHg  Pulse 83  Temp(Src) 96.4 F (35.8 C) (Axillary)  Resp 17  Ht 5\' 2"  (1.575 m)  Wt 167 lb 12.3 oz (76.1 kg)  BMI 30.68 kg/m2  SpO2 97%  HEMODYNAMICS:    VENTILATOR SETTINGS:    INTAKE / OUTPUT: I/O last 3 completed shifts: In: 2888.1 [P.O.:1346; I.V.:1014.1; Other:28; IV [id number] Out: [id number] [Urine:48; [id number]  PHYSICAL EXAMINATION: General:  Awake. No acute distress. Family at bedside. Integument:  Warm & dry. No rash on exposed skin.  HEENT:  No scleral injection. No oral ulcers. Moist mucous membranes. Cardiovascular:  Regular rhythm. Lower extremity edema persists and is unchanged. Unable to appreciate JVD.  Pulmonary: Clear to auscultation bilaterally. Normal work of breathing on room air. Abdomen: Protuberant.   somewhat firm. Hypoactive bowel sounds.  Neurological:  Following commands.  Grossly non-focal. More appropriate today.  LABS:  BMET  Recent Labs Lab 01/16/16 0400 01/16/16 1600 01/17/16 0510  NA 136 137 135  K 3.6 3.5 3.3*  CL 100* 102 101  CO2 26 25 26   BUN 13 13 13   CREATININE 1.02* 0.90 1.02*  GLUCOSE 225* 177* 216*    Electrolytes  Recent Labs Lab 01/15/16 0415  01/16/16 0400 01/16/16 1600 01/17/16 0510  CALCIUM 7.6*  < > 7.6* 7.7* 7.5*  MG 2.4  --  2.3  --  2.4  PHOS 3.0  < > 2.5 2.0* 1.9*  < > = values in this interval not displayed.  CBC  Recent Labs Lab 01/15/16 0415 01/16/16 0400 01/17/16 0510  WBC 26.5* 17.6* 8.5  HGB 8.4* 7.9* 7.5*  HCT 24.5* 23.7* 22.4*  PLT 85* 65* 49*    Coag's No results for input(s): APTT, INR in the last 168 hours.  Sepsis Markers  Recent Labs Lab 01/11/16 2109  LATICACIDVEN 3.0*    ABG  Recent Labs Lab 01/11/16 2201 01/12/16 0020 01/12/16 0353  PHART 7.201* 7.294* 7.400  PCO2ART 34.1* 33.2* 26.5*  PO2ART 391* 220* 152*    Liver Enzymes  Recent Labs Lab 01/11/16 1652  01/16/16 0400 01/16/16 1600 01/17/16 0510  AST 25  --   --   --   --   ALT 14  --   --   --   --   ALKPHOS 93  --   --   --   --   BILITOT 0.9  --   --   --   --   ALBUMIN 2.1*  < > 1.7* 1.7* 1.7*  < > = values in this interval not displayed.  Cardiac Enzymes  Recent Labs Lab 01/11/16 2205  TROPONINI 0.26*    Glucose  Recent Labs Lab 01/15/16 2147 01/16/16 0806 01/16/16 1152 01/16/16 1615 01/16/16 2145 01/17/16 0807  GLUCAP 174* 236* 301* 162* 157* 184*    Imaging US Abdomen Limited  01/16/2016  CLINICAL DATA:  Current history of metastatic endometrial cancer with peritoneal and omental metastatic disease. Abdominal distension. Evaluate for possible paracentesis. EXAM: LIMITED ABDOMEN ULTRASOUND FOR ASCITES TECHNIQUE: Limited ultrasound survey for ascites was performed in all four abdominal quadrants. COMPARISON:  CT abdomen and pelvis 01/11/2016. FINDINGS: A small amount of ascites is  present in all 4 quadrants of the abdomen, though none of the pockets of fluid are large enough for paracentesis currently. IMPRESSION: Small amount of ascites. None of the pockets of fluid are large enough for paracentesis currently. Electronically Signed   By: Brittany Schwartz M.D.   On: 01/16/2016 16:00    ASSESSMENT / PLAN:  Unfortunate 65 year old female with Grade 3 serous/endometriod endometrial carcinoma with recurrence. Patient continuing on CVVHD with hypotension. Cultures still negative & despite broad-spectrum antibiotics patient does not seem to be progressing. Cell counts continuing to trend downward. I had a lengthy discussion with the patient's family at bedside. They report family are traveling from [place] and should arrive on Saturday. They wish to give the patient time to recover and if unable to do so by the time of family's arrival would consider transitioning to  full comfort care. Family are concerned and desired that we remain positive and her presence even if we transition to full comfort care.  1. Recurrent Endometrial Cancer:  Oncology following. Known malignant ascites. Patient s/p fist dose of chemotherapy recently. Trending cell counts daily with CBC. Fentanyl IV prn. 2. Acute Renal Failure:  Currently on CVVHD & tolerating well. Nephrology following. Assess daily for signs of recovery. Trending electrolytes & renal function twice daily. 3. Septic Shock: Still requiring low dose vasopressors. Awaiting blood culture results. Day #6 of empiric Vancomycin & Zosyn. 4. Malignant Ascites:  Unable to perform paracentesis. 5. Possible Adrenal Insufficiency:  Discontinued stress dose steroids after 5 days on 2/13. 6. Anemia/Thrombocytopenia:  Slowly worsening. No signs of active bleeding. Likely secondary to chemotherapy. Trending cell counts daily.  7. H/O DM:  BG controlled. Low dose SSI algorithm & accu-checks to qAC & HS. 8. Elevated Troponin I:  Likely demand ischemia in setting  of shock. No wall motion abnormalities on TTE to suggest MI. 9. Acute Hypoxic Respiratory Failure:  Resolved. Extubated 2/12. Continuing pulmonary toilette with IS. 10. Acute Encephalopathy:  Likely toxic metabolic vs delirium. Improving. 11. Hyponatremia:  Resolved. 12. Prophylaxis:  D/C Heparin Cassia. Continue SCDs & Protonix PO daily. 13. Diet:  Continue diabetic diet. Nutrition consulted for recommendations. 14. Disposition:  Patient will remain as long as she needs CVVHD & pressors.  May transition to full palliative care this weekend. Palliative medicine following.  Family Updates: Family updated at bedside by Dr. Ashok Cordia 2/15.  I have spent a total of 41 minutes of critical care time today caring for the patient, reviewing the patient's electronic medical record, and discussing plan of care with family at bedside.  Sonia Baller Ashok Cordia, M.D. [facility] Pulmonary [facility] Pager:  [phone] After 3pm or if no response, call [phone] 01/17/2016, 9:08 AM

## 2016-01-17 NOTE — Progress Notes (Signed)
Nutrition Follow-up  DOCUMENTATION CODES:   Not applicable  INTERVENTION:  -Change Diet to Vegetarian -Glucerna Shake po TID, each supplement provides 220 kcal and 10 grams of protein  NUTRITION DIAGNOSIS:   Inadequate oral intake related to inability to eat as evidenced by NPO status.  ongoing  GOAL:   Patient will meet greater than or equal to 90% of their needs  Not currently meeting  MONITOR:   I & O's, Diet advancement, Vent status, Labs, Weight trends  REASON FOR ASSESSMENT:   Ventilator    ASSESSMENT:   65 year old female with PMH as below, which includes grade 3 serous/ endometrioid endometrial carcinoma IIIA at diagnosis, adjuvant chemo completed 06/16/15. Now with rapid recurrent disease since mid January with large volume malignant ascites, bulky involvement pelvis and abdominal caking.  RD consulted to pt, spoke to family. Pt was on Carb-Modified diet but is vegetarian. Family was bringing food from home for pt. Changed to vegetarian diet.   She was extubated earlier this week. Pt hasn't been eating much during stay. Family brings small bowls of rice & lentils when her trays were inappropriate. Wasn't eating much prior to admission r/t to cancer, overall pain, poor appetite.  Pt is still on CRRT w/ pressors. Had a palliative care meeting yesterday, family has not come to a conclusion on her goals of care yet, but pt is poor prognosis. Another palliative follow up is to take place today.  Labs:  K 3.3, CBGs 157-301, Cr 1.02, Ca 7.5 Medications: Levophed   Diet Order:  Diet vegetarian Room service appropriate?: Yes; Fluid consistency:: Thin  Skin:  Wound (see comment) (Bilateral ecchymosis to arms.)  Last BM:  2/3  Height:   Ht Readings from Last 1 Encounters:  01/12/16 5\' 2"  (1.575 m)    Weight:   Wt Readings from Last 1 Encounters:  01/17/16 167 lb 12.3 oz (76.1 kg)    Ideal Body Weight:     BMI:  Body mass index is 30.68  kg/(m^2).  Estimated Nutritional Needs:   Kcal:  [id number]  Protein:  95-130g  Fluid:  >/= 2L  EDUCATION NEEDS:   No education needs identified at this time  Satira Anis. Mana Haberl, MS, RD LDN After Hours/Weekend Pager [phone]

## 2016-01-17 NOTE — Progress Notes (Signed)
Amion paged staff at palliative care to notify that family will be unable to attend meeting planned at 1:30 pm.  Family reported that they would be able to attend a palliative meeting at 3:00 pm or later.

## 2016-01-17 NOTE — Progress Notes (Signed)
Pharmacy Antibiotic Note  Brittany Schwartz is a 65 y.o. female admitted on 01/11/2016 with sepsis/shock.  Pharmacy has been consulted for vancomycin and zosyn dosing.Today is Day #6 of antibiotics.  She remains on CRRT.  Noted to be hypothermic.    Plan:  Continue vancomycin 1gm IV q24h - will order trough for am.  Attempt to follow-up with CCM in am and d/c if vancomycin to stop. Discussed with CCM 2/14 re: stopping vancomycin but wishes to continue as remains on vasopressors.    Continue zosyn 3.375gm IV q6h for CRRT  Height: 5\' 2"  (157.5 cm) Weight: 167 lb 12.3 oz (76.1 kg) IBW/kg (Calculated) : 50.1  Temp (24hrs), Avg:96.4 F (35.8 C), Min:95.1 F (35.1 C), Max:97.3 F (36.3 C)   Recent Labs Lab 01/11/16 2109  01/13/16 0500  01/14/16 0630  01/15/16 0415 01/15/16 1600 01/16/16 0400 01/16/16 1600 01/17/16 0510  WBC  --   < > 16.3*  --  16.0*  --  26.5*  --  17.6*  --  8.5  CREATININE 3.56*  < > 1.94*  < > 1.29*  < > 0.97 0.98 1.02* 0.90 1.02*  LATICACIDVEN 3.0*  --   --   --   --   --   --   --   --   --   --   VANCORANDOM  --   --   --   --  11  --   --   --   --   --   --   < > = values in this interval not displayed.  Estimated Creatinine Clearance: 52.5 mL/min (by C-G formula based on Cr of 1.02).    No Known Allergies  Antimicrobials this admission: 2/9 vanc>> 2/9 zosyn>>   Levels/dose changes this admission: 2/10: with start of CRRT, changed Zosyn to 3.375g IV q6 and started Vanc dosing at 1g q24  Microbiology results: 2/10 MRSA PCR neg 2/9 urine: 8K insignificant growth  2/9 blood: NGTD  Thank you for allowing pharmacy to be a part of this patient's care.  Doreene Eland, PharmD, BCPS.   Pager: [phone] 01/17/2016 10:09 AM

## 2016-01-18 ENCOUNTER — Telehealth: Payer: Self-pay | Admitting: Oncology

## 2016-01-18 DIAGNOSIS — R410 Disorientation, unspecified: Secondary | ICD-10-CM

## 2016-01-18 LAB — RENAL FUNCTION PANEL
ANION GAP: 10 (ref 5–15)
Albumin: 1.7 g/dL — ABNORMAL LOW (ref 3.5–5.0)
Albumin: 1.6 g/dL — ABNORMAL LOW (ref 3.5–5.0)
Anion gap: 9 (ref 5–15)
BUN: 13 mg/dL (ref 6–20)
BUN: 13 mg/dL (ref 6–20)
CALCIUM: 7.8 mg/dL — AB (ref 8.9–10.3)
CHLORIDE: 101 mmol/L (ref 101–111)
CO2: 26 mmol/L (ref 22–32)
CO2: 27 mmol/L (ref 22–32)
CREATININE: 0.86 mg/dL (ref 0.44–1.00)
Calcium: 7.7 mg/dL — ABNORMAL LOW (ref 8.9–10.3)
Chloride: 101 mmol/L (ref 101–111)
Creatinine, Ser: 0.93 mg/dL (ref 0.44–1.00)
GFR calc non Af Amer: >60 mL/min (ref >60)
GFR calc non Af Amer: >60 mL/min (ref >60)
GLUCOSE: 208 mg/dL — AB (ref 65–99)
Glucose, Bld: 198 mg/dL — ABNORMAL HIGH (ref 65–99)
PHOSPHORUS: 2 mg/dL — AB (ref 2.5–4.6)
POTASSIUM: 4 mmol/L (ref 3.5–5.1)
Phosphorus: 2.1 mg/dL — ABNORMAL LOW (ref 2.5–4.6)
Potassium: 4 mmol/L (ref 3.5–5.1)
SODIUM: 137 mmol/L (ref 135–145)
Sodium: 137 mmol/L (ref 135–145)

## 2016-01-18 LAB — MAGNESIUM: MAGNESIUM: 2.4 mg/dL (ref 1.7–2.4)

## 2016-01-18 LAB — CBC WITH DIFFERENTIAL/PLATELET
BASOS ABS: 0 10*3/uL (ref 0.0–0.1)
Basophils Relative: 0 %
EOS ABS: 0 10*3/uL (ref 0.0–0.7)
Eosinophils Relative: 0 %
HEMATOCRIT: 22.8 % — AB (ref 36.0–46.0)
HEMOGLOBIN: 7.5 g/dL — AB (ref 12.0–15.0)
LYMPHS ABS: 0.4 10*3/uL — AB (ref 0.7–4.0)
LYMPHS PCT: 16 %
MCH: 30.2 pg (ref 26.0–34.0)
MCHC: 32.9 g/dL (ref 30.0–36.0)
MCV: 91.9 fL (ref 78.0–100.0)
MONOS PCT: 2 %
Monocytes Absolute: 0 10*3/uL — ABNORMAL LOW (ref 0.1–1.0)
Neutro Abs: 1.9 10*3/uL (ref 1.7–7.7)
Neutrophils Relative %: 82 %
Platelets: 31 10*3/uL — ABNORMAL LOW (ref 150–400)
RBC: 2.48 MIL/uL — AB (ref 3.87–5.11)
RDW: 14 % (ref 11.5–15.5)
WBC: 2.3 10*3/uL — AB (ref 4.0–10.5)

## 2016-01-18 LAB — GLUCOSE, CAPILLARY
GLUCOSE-CAPILLARY: 199 mg/dL — AB (ref 65–99)
Glucose-Capillary: 186 mg/dL — ABNORMAL HIGH (ref 65–99)
Glucose-Capillary: 189 mg/dL — ABNORMAL HIGH (ref 65–99)
Glucose-Capillary: 201 mg/dL — ABNORMAL HIGH (ref 65–99)

## 2016-01-18 LAB — VANCOMYCIN, TROUGH: VANCOMYCIN TR: 17 ug/mL (ref 10.0–20.0)

## 2016-01-18 MED ORDER — SODIUM PHOSPHATE 3 MMOLE/ML IV SOLN
10.0000 mmol | Freq: Every day | INTRAVENOUS | Status: DC
  Administered 2016-01-18 – 2016-01-20 (×3): 10 mmol via INTRAVENOUS
  Filled 2016-01-18 (×3): qty 3.33

## 2016-01-18 MED ORDER — LORAZEPAM 2 MG/ML IJ SOLN: 1.0000 mg | INTRAMUSCULAR | Status: DC | PRN

## 2016-01-18 MED ORDER — FENTANYL CITRATE (PF) 100 MCG/2ML IJ SOLN
25.0000 ug | INTRAMUSCULAR | Status: DC | PRN
  Administered 2016-01-19 (×2): 25 ug via INTRAVENOUS
  Filled 2016-01-18 (×2): qty 2

## 2016-01-18 NOTE — Progress Notes (Signed)
[facility] Progress Note   Subjective:  Tolerating  CRRT  No filter issues No heparin 4K dialysate/4K replacement fluids BP limiting any UF and levo dose a little higher today at 5 Gave K runs and IV Na phos yesterday Minimal to no UOP  Filed Vitals:   01/18/16 1030 01/18/16 1100 01/18/16 1130 01/18/16 1200  BP: 119/70 98/56 93/50  101/54  Pulse:  75  78  Temp:      TempSrc:      Resp:  14  15  Height:      Weight:      SpO2:  97%  96%   Exam: BP 101/54 mmHg  Pulse 78  Temp(Src) 95.6 F (35.3 C) (Rectal)  Resp 15  Ht 5\' 2"  (1.575 m)  Wt 73 kg (160 lb 15 oz)  BMI 29.43 kg/m2  SpO2 96% Weight 73 kg (from max of 78.3 kg( Awake, alert Supine, no oxygen No distress No jvd Line left IJ for CRRT (2/9) Chest clear  Regular, HR 80's Abd mod-severe ascites, nontender - very tight - + abd wall edema 2-3+ bilat LE pitting edema to hips unchanged  Inpatient medications: . antiseptic oral rinse  7 mL Mouth Rinse BID  . feeding supplement (GLUCERNA SHAKE)  237 mL Oral TID BM  . insulin aspart  0-5 Units Subcutaneous QHS  . insulin aspart  0-9 Units Subcutaneous TID WC  . latanoprost  1 drop Both Eyes QHS  . pantoprazole  40 mg Oral Daily  . piperacillin-tazobactam  3.375 g Intravenous 4 times per day  . sodium chloride flush  10-40 mL Intracatheter Q12H  . Tbo-filgastrim (GRANIX) SQ  300 mcg Subcutaneous Daily  . timolol  1 drop Both Eyes BID  . vancomycin  1,000 mg Intravenous Q24H  Infusions: . norepinephrine (LEVOPHED) Adult infusion 5 mcg/min (01/18/16 1200)  . dialysis replacement fluid (prismasate) 300 mL/hr at 01/18/16 0742  . dialysis replacement fluid (prismasate) 500 mL/hr at 01/18/16 1123  . dialysate (PRISMASATE) 1,500 mL/hr at 01/18/16 1010   Prn medications sodium chloride, fentaNYL (SUBLIMAZE) injection, heparin, heparin, phenol, sodium chloride flush    Recent Labs Lab 01/17/16 0510 01/17/16 1600 01/18/16 0421  NA 135 136 137   K 3.3* 4.0 4.0  CL 101 102 101  CO2 26 26 26   GLUCOSE 216* 176* 198*  BUN 13 10 13   CREATININE 1.02* 0.74 0.93  CALCIUM 7.5* 7.9* 7.7*  PHOS 1.9* 1.7* 2.0*    Recent Labs Lab 01/11/16 1652  01/17/16 0510 01/17/16 1600 01/18/16 0421  AST 25  --   --   --   --   ALT 14  --   --   --   --   ALKPHOS 93  --   --   --   --   BILITOT 0.9  --   --   --   --   PROT 5.7*  --   --   --   --   ALBUMIN 2.1*  < > 1.7* 1.9* 1.7*  < > = values in this interval not displayed.  Recent Labs Lab 01/16/16 0400 01/17/16 0510 01/18/16 0421  WBC 17.6* 8.5 2.3*  NEUTROABS 17.2* 8.2* 1.9  HGB 7.9* 7.5* 7.5*  HCT 23.7* 22.4* 22.8*  MCV 91.5 91.4 91.9  PLT 65* 49* 31*    CXR 2/11 > normal cxr CT chest 2/9 > perihilar edema, bibasilar consolidation L> R ECHO 1/31 > normal LV/ RV     Assessment: 1.  AKI  (contrast/ hypotension/ L hydro/ larg vol paracentesis x 2) - anuric.  CRRT D#7. Tolerating fine. Marked anasarca of LE's and malignant ascites; clear CXR.  Exam not really changed. 3rd spaced fluid primarily due to low alb 1.5, cap leak from malignancy. Bilateral ureteral stents in place (2/11).  Not a candidate for long-term dialysis given severe comorbidities, family and patient are all aware (our partner Dr. Elmarie Shiley has also spoken with family member about non-candidacy for long term dialysis).  No evidence for any return of renal function.   Remains on norepi.  Continue to try to pull some fluid if she will tolerate - goal range of 0-75 neg/hour so that nursing can pull if hourly BP's allow. Gave K runs X2 and changed all fluids to 4 + gave/10 mmoles sodium phos for P of 1.9 all on 2/15. Will add  standing dose of sodium phos 10 mmoles/day for borderline phos levels.   2. Recurrent metastatic endometrial cancer- recurrent, s/p adriamycin 2/8 as salvage for recurrent disease, not a situation of potential cure per oncology. Dr. Marko Plume recommended peritoneal catheter to help keep ascites  decompressed. Last paracentesis was 2/11 - 4 liters. [place] 2/14 yielded no safe sites for paracentesis. 3. Septic shock- remains on vanco, zosyn, and vasopressors 4. Pancytopenia - continued drop in WBC, plts. Hb stable around 7.5 5. Acute hypoxic resp failure - resolved 6. DM on oral agents 7. Malnutrition/ hypoalbuminemia 8. Anemia 9. Disposition - limited code and family meeting scheduled for today  Jamal Maes, MD [place] Pager 01/18/2016, 12:30 PM

## 2016-01-18 NOTE — Progress Notes (Signed)
Filter clotting warning given at 0745. Blood returned to patient and filter changed. Filter ran for 45 hours. Filter changed and CRRT resumed at 0808.

## 2016-01-18 NOTE — Progress Notes (Addendum)
Daily Progress Note   Patient Name: Brittany Schwartz       Date: 01/18/2016 DOB: [birth year]  Age: 65 y.o. MRN#: [id number] Attending Physician: [name], MD Primary Care Physician: [name], MD Admit Date: 01/11/2016  Reason for Consultation/Follow-up: Establishing goals of care  Subjective:  weak appearing female, sleepy and confused at time of encounter.  Denied complaints  Family meeting: Met with the patient's husband, 2 daughters, son in law and cousin.  Patient seemed more confused today and was able to participate in conversation. We reviewed the patient's current hospitalization course including level of ICU support required with CVVHD and pressor support as well as underlying advanced endometrial cancer.  We talked again about critical illness, her high likelihood for further clinical decompensation, and no significant chance of meaningful recovery.  Family states that it is important for them to avoid the patient's suffering. At the same time, they feel as though she is able to interact with family and is not currently suffering and therefore do not want to make changes in current level of care at this time.   She has family coming from [place] to visit this weekend.  They are arriving late on Saturday evening.  Family would like to continue with current level of care, but no escalation in care if her condition were to decompensate.  Code status updated to reflect partial code with plan for continued vasopressor and CRRT, but no CPR/intubation/Bipap. We will plan to meet again on Monday after family from [place] has had a chance to visit to reassess and continue long term discussions.    Length of Stay: 7 days  Current Medications: Scheduled Meds:  . antiseptic oral rinse  7  mL Mouth Rinse BID  . feeding supplement (GLUCERNA SHAKE)  237 mL Oral TID BM  . insulin aspart  0-5 Units Subcutaneous QHS  . insulin aspart  0-9 Units Subcutaneous TID WC  . latanoprost  1 drop Both Eyes QHS  . pantoprazole  40 mg Oral Daily  . piperacillin-tazobactam  3.375 g Intravenous 4 times per day  . sodium chloride flush  10-40 mL Intracatheter Q12H  . sodium phosphate  Dextrose 5% IVPB  10 mmol Intravenous Q1200  . Tbo-filgastrim (GRANIX) SQ  300 mcg Subcutaneous Daily  . timolol  1 drop Both Eyes BID  .  vancomycin  1,000 mg Intravenous Q24H    Continuous Infusions: . norepinephrine (LEVOPHED) Adult infusion 4 mcg/min (01/18/16 1900)  . dialysis replacement fluid (prismasate) 300 mL/hr at 01/18/16 0742  . dialysis replacement fluid (prismasate) 500 mL/hr at 01/18/16 1123  . dialysate (PRISMASATE) 1,500 mL/hr at 01/18/16 1704    PRN Meds: sodium chloride, heparin, heparin, LORazepam, phenol, sodium chloride flush  Physical Exam: Physical Exam             Weak lady resting in bed Confused today, does not verbalize much Shallow breathing S1 S2 Abdomen distended Trace edema No coolness no mottling  Vital Signs: BP 106/47 mmHg  Pulse 94  Temp(Src) 97.9 F (36.6 C) (Axillary)  Resp 24  Ht 5' 2"  (1.575 m)  Wt 73 kg (160 lb 15 oz)  BMI 29.43 kg/m2  SpO2 92% SpO2: SpO2: 92 % O2 Device: O2 Device: Not Delivered O2 Flow Rate: O2 Flow Rate (L/min): 2 L/min  Intake/output summary:   Intake/Output Summary (Last 24 hours) at 01/18/16 1930 Last data filed at 01/18/16 1900  Gross per 24 hour  Intake 1818.85 ml  Output   3422 ml  Net -1603.15 ml   LBM: Last BM Date: 01/17/16 Baseline Weight: Weight: 78 kg (171 lb 15.3 oz) Most recent weight: Weight: 73 kg (160 lb 15 oz)       Palliative Assessment/Data: Flowsheet Rows        Most Recent Value   Intake Tab    Referral Department  Hospitalist   Unit at Time of Referral  ICU   Palliative Care Primary Diagnosis   Cancer   Date Notified  01/14/16   Palliative Care Type  New Palliative care   Reason for referral  Clarify Goals of Care   Date of Admission  01/11/16   Date first seen by Palliative Care  01/15/16   # of days Palliative referral response time  1 Day(s)   # of days IP prior to Palliative referral  3   Clinical Assessment    Palliative Performance Scale Score  20%   Pain Max last 24 hours  4   Pain Min Last 24 hours  3   Dyspnea Max Last 24 Hours  4   Dyspnea Min Last 24 hours  3   Psychosocial & Spiritual Assessment    Palliative Care Outcomes    Patient/Family meeting held?  Yes   Who was at the meeting?  daughter, patient, patient's sister, patient's son in law.    Palliative Care Outcomes  Clarified goals of care   Palliative Care follow-up planned  Yes, Facility      Additional Data Reviewed: CBC    Component Value Date/Time   WBC 2.3* 01/18/2016 0421   WBC 15.2* 01/10/2016 1323   RBC 2.48* 01/18/2016 0421   RBC 2.99* 01/10/2016 1323   HGB 7.5* 01/18/2016 0421   HGB 8.6* 01/10/2016 1323   HCT 22.8* 01/18/2016 0421   HCT 26.8* 01/10/2016 1323   PLT 31* 01/18/2016 0421   PLT 245 01/10/2016 1323   MCV 91.9 01/18/2016 0421   MCV 89.6 01/10/2016 1323   MCH 30.2 01/18/2016 0421   MCH 28.7 01/10/2016 1323   MCHC 32.9 01/18/2016 0421   MCHC 32.1 01/10/2016 1323   RDW 14.0 01/18/2016 0421   RDW 13.5 01/10/2016 1323   LYMPHSABS 0.4* 01/18/2016 0421   LYMPHSABS 0.7* 01/10/2016 1323   MONOABS 0.0* 01/18/2016 0421   MONOABS 1.3* 01/10/2016 1323   EOSABS 0.0 01/18/2016  0421   EOSABS 0.0 01/10/2016 1323   BASOSABS 0.0 01/18/2016 0421   BASOSABS 0.0 01/10/2016 1323    CMP     Component Value Date/Time   NA 137 01/18/2016 1620   NA 122 Repeated and Verified* 01/08/2016 1132   K 4.0 01/18/2016 1620   K 4.7 01/08/2016 1132   CL 101 01/18/2016 1620   CO2 27 01/18/2016 1620   CO2 16* 01/08/2016 1132   GLUCOSE 208* 01/18/2016 1620   GLUCOSE 260* 01/08/2016 1132    BUN 13 01/18/2016 1620   BUN 30.0* 01/08/2016 1132   CREATININE 0.86 01/18/2016 1620   CREATININE 1.6* 01/08/2016 1132   CALCIUM 7.8* 01/18/2016 1620   CALCIUM 8.3* 01/08/2016 1132   PROT 5.7* 01/11/2016 1652   PROT 5.1* 01/08/2016 1132   ALBUMIN 1.6* 01/18/2016 1620   ALBUMIN 1.7* 01/08/2016 1132   AST 25 01/11/2016 1652   AST 16 01/08/2016 1132   ALT 14 01/11/2016 1652   ALT 11 01/08/2016 1132   ALKPHOS 93 01/11/2016 1652   ALKPHOS 73 01/08/2016 1132   BILITOT 0.9 01/11/2016 1652   BILITOT 0.71 01/08/2016 1132   GFRNONAA >60 01/18/2016 1620   GFRAA >60 01/18/2016 1620       Problem List:  Patient Active Problem List   Diagnosis Date Noted  . Goals of care, counseling/discussion   . Acute respiratory failure with hypoxemia ([place])   . Delirium   . Acute renal failure ([place])   . Septic shock ([place])   . Encounter for palliative care   . Hyperkalemia 01/11/2016  . Dyspnea   . Encounter for intubation   . SOB (shortness of breath)   . Acute respiratory failure with hypoxia ([place])   . Malignant neoplasm of uterus ([place])   . Endometrial cancer, FIGO stage IVB ([place]) 01/09/2016  . Protein-calorie malnutrition, severe ([place]) 01/09/2016  . Hypoalbuminemia 01/09/2016  . AKI (acute kidney injury) ([place]) 01/04/2016  . Elevated lactic acid level 01/04/2016  . Malignant ascites   . Non-insulin dependent type 2 diabetes mellitus ([place])   . Metastatic cancer ([place]) 12/29/2015  . Ascites 12/29/2015  . Hydronephrosis, left 12/29/2015  . Abdominal distension 12/29/2015  . Antineoplastic chemotherapy induced anemia 08/15/2015  . Esophageal reflux 05/06/2015  . 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 Anemia associated with chemotherapy 04/14/2015  . Chemotherapy induced neutropenia ([place]) 03/05/2015  . Absolute anemia 02/24/2015  . Type 2 diabetes mellitus without complication ([place]) 36/05/7702  . Essential hypertension 02/24/2015  . Glaucoma 02/12/2015  . Anemia  02/12/2015  . Hypochloremia 02/12/2015  . Hyponatremia 02/12/2015  . Dysuria 02/03/2015  . Uterine cancer ([place]) 01/19/2015  . Endometrial cancer ([place]) 01/09/2015  . Diabetes mellitus ([place]) 01/09/2015  . Hypertension 01/09/2015  . Hypercholesterolemia 01/09/2015     Palliative Care Assessment & Plan    1.Code Status:  Limited code    Code Status Orders        Start     Ordered   01/11/16 2106  Limited resuscitation (code)   Continuous    Question Answer Comment  In the event of cardiac or respiratory ARREST: Initiate Code Blue, Call Rapid Response No   In the event of cardiac or respiratory ARREST: Perform CPR No   In the event of cardiac or respiratory ARREST: Perform Intubation/Mechanical Ventilation Yes   In the event of cardiac or respiratory ARREST: Use NIPPV/BiPAp only if indicated Yes   In the event of cardiac or respiratory ARREST: Administer ACLS medications if  indicated Yes   In the event of cardiac or respiratory ARREST: Perform Defibrillation or Cardioversion if indicated No      01/11/16 2107    Code Status History    Date Active Date Inactive Code Status Order ID Comments User Context   01/11/2016  8:26 PM 01/11/2016  9:07 PM Partial Code [id number]  Corey Harold, NP ED   01/05/2016  1:38 AM 01/07/2016  7:04 PM Full Code [id number]  Edwin Dada, MD Inpatient   12/29/2015  4:27 PM 01/04/2016  4:59 PM Full Code [id number]  Domenic Polite, MD ED   01/19/2015  8:22 PM 01/20/2015  4:39 PM Full Code [id number]  Lahoma Crocker, MD Inpatient       2. Goals of Care/Additional Recommendations: As above  Desire for further Chaplaincy support:no  Psycho-social Needs: Caregiving  Support/Resources  3. Symptom Management:      1. As above- fentanyl iv prn   4. Palliative Prophylaxis:   Delirium Protocol  5. Prognosis: guarded regardless, however, could be as short as hours-days if full scope of comfort measures is established.   6. Discharge  Planning:  Pending further discussions with family members, but need to consider the possibility of a hospital death if comfort measures are to be established and pressors/ CRRT is discontinued. This has been discussed with family.    Care plan was discussed with patient, son in law, 2 daughters, husband, cousin.   Thank you for allowing the Palliative Medicine Team to assist in the care of this patient.   Time In:  1330 Time Out: 1445 Total Time 75 Prolonged Time Billed  yes       [phone] Micheline Rough, MD  01/18/2016, 7:30 PM  Please contact Palliative Medicine Team phone at [phone] for questions and concerns.

## 2016-01-18 NOTE — Telephone Encounter (Signed)
Per 02/16 POF cancel apts for 02/20 due to pt is in hospital.... KJ

## 2016-01-18 NOTE — Progress Notes (Signed)
eLink Physician-Brief Progress Note Patient Name: Brittany Schwartz DOB: 02-Jan-1951 MRN: [id number]   Date of Service  01/18/2016  HPI/Events of Note  Complex situation; advanced malignancy, encephalopathy However complaining of throat and abdominal pain RN notes low dose fentanyl worked well during this stay  eICU Interventions  Restart low dose fentanyl     Intervention Category Intermediate Interventions: Pain - evaluation and management  Simonne Maffucci 01/18/2016, 7:55 PM

## 2016-01-18 NOTE — Progress Notes (Signed)
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  Now with rapid recurrent disease since mid January with large volume malignant ascites, bulky involvement pelvis and abdominal caking.  She is followed by Dr. Marko Plume with oncology and is currently undergoing chemo.  Family reports that oncologist feels as though there is hope for improvement with chemotherapy. She received last dose of chemotherapy 2/8. She had 2u PRBC transfusion 2/9 for symptomatic anemia.  During transfusion, she complained of SOB with diaphoresis.  She was subsequently sent to ED for further evaluation.  In ED, she was hypertensive and remained SOB.  She was therefore placed on BiPAP. CXR showed significant pulmonary edema. Laboratory assessment was significant for renal failure with K 7.2, Na 119 (chronically low), and SCr 3.61. She is also hyperglycemic with Glucose 369. Lab eval also notable for WBC 20.2. CT abdomen from a few days prior consistent with progressive mass size with compression of L ureter and L hydronephrosis. She was unable to tolerate BiPAP in ED and required intubation.  PCCM asked to admit.  Extensive discussion with family by Dr. Titus Mould.  Given Mrs. Mcsweeney's hx and current circumstances, decision made to limit code status to intubation and vasopressors only.  In the event of cardiac arrest, we would not put her through CPR or defibrillation.  Full medical management otherwise.  STUDIES:  TTE 1/31:  LV EF 65-70%. Normal wall motion. Grade 1 diastolic dysfunction. LA & RA normal in size. RV normal in size & function. PASP 21 mmHg. Normal aortic & mitral  valves. No pericardial effusion. Port CXR 2/11: No opacity. ETT in good position. R IJ CVL in good position.  TTE 2/11:  EF 65-70% w/ normal wall motion. Grade 1 diastolic dysfunction. RV normal in size & function. No effusion.  MICROBIOLOGY: Blood Ctx x2 2/9:  Negative  Urine Ctx 2/9:  Negative  Blood Ctx x2 2/3:  Negative  Urine Ctx 2/2:  Negative   ANTIBIOTICS: Vanc 02/09 > Zosyn 02/09 >  SIGNIFICANT EVENTS: 02/09 > admitted with acute hypoxic respiratory failure, multiple electrolyte derangements. 02/10 > started CVVHD 02/11 > ureteral stent placed 02/12 > extubated  LINES/TUBES: R IJ HD cath 02/09 > Foley 2/9 > R Chest Port 1/31 > OETT 7.5 02/09 - 2/12 OGT 2/9 - 2/12  SUBJECTIVE: No acute events overnight. Palliative care meeting scheduled for today at 1:30pm. Patient less alert today but denies any discomfort. She dozes off easily & unable to stay aroused. Continues to have abdominal discomfort with swallowing per nurse.   REVIEW OF SYSTEMS:  Unable to obtain accurate ROS given increased somnolence.   VITAL SIGNS: BP 87/52 mmHg  Pulse 77  Temp(Src) 95.2 F (35.1 C) (Rectal)  Resp 13  Ht 5\' 2"  (1.575 m)  Wt 160 lb 15 oz (73 kg)  BMI 29.43 kg/m2  SpO2 95%  HEMODYNAMICS:    VENTILATOR SETTINGS:    INTAKE / OUTPUT: I/O last 3 completed shifts: In: 2882.5 [P.O.:881; I.V.:1038.5; IV [id number] Out: [id number] [Urine:35; Other:4035]  PHYSICAL EXAMINATION: General:  Eyes closed. No acute distress. Daughter at bedside. Integument:  Warm & dry. No rash on exposed skin.  HEENT:  No scleral injection. No oral ulcers. Moist mucous membranes. Cardiovascular:  Regular rhythm. Anasarca worsening. Unable to appreciate JVD.  Pulmonary: Clear to auscultation bilaterally. Normal work of breathing on room air. Abdomen: Protuberant. Firm. Hypoactive bowel sounds.  Neurological:  Wakes up transiently to voice. Following commands.  LABS:  BMET  Recent Labs Lab  01/17/16 0510 01/17/16 1600 01/18/16 0421  NA 135 136 137  K 3.3* 4.0 4.0  CL 101 102 101  CO2 26 26 26   BUN 13 10 13   CREATININE 1.02* 0.74 0.93  GLUCOSE 216* 176* 198*    Electrolytes  Recent Labs Lab 01/16/16 0400  01/17/16 0510 01/17/16 1600 01/18/16 0421  CALCIUM 7.6*  < > 7.5* 7.9* 7.7*  MG 2.3  --  2.4  --  2.4  PHOS 2.5  < > 1.9* 1.7* 2.0*  < > = values in this interval not displayed.  CBC  Recent Labs Lab 01/16/16 0400 01/17/16 0510 01/18/16 0421  WBC 17.6* 8.5 2.3*  HGB 7.9* 7.5* 7.5*  HCT 23.7* 22.4* 22.8*  PLT 65* 49* 31*    Coag's No results for input(s): APTT, INR in the last 168 hours.  Sepsis Markers  Recent Labs Lab 01/11/16 2109  LATICACIDVEN 3.0*    ABG  Recent Labs Lab 01/11/16 2201 01/12/16 0020 01/12/16 0353  PHART 7.201* 7.294* 7.400  PCO2ART 34.1* 33.2* 26.5*  PO2ART 391* 220* 152*    Liver Enzymes  Recent Labs Lab 01/11/16 1652  01/17/16 0510 01/17/16 1600 01/18/16 0421  AST 25  --   --   --   --   ALT 14  --   --   --   --   ALKPHOS 93  --   --   --   --   BILITOT 0.9  --   --   --   --   ALBUMIN 2.1*  < > 1.7* 1.9* 1.7*  < > = values in this interval not displayed.  Cardiac Enzymes  Recent Labs Lab 01/11/16 2205  TROPONINI 0.26*    Glucose  Recent Labs Lab 01/16/16 2145 01/17/16 0807 01/17/16 1119 01/17/16 1650 01/17/16 2150 01/18/16 0835  GLUCAP 157* 184* 182* 207* 182* 186*    Imaging No results found.  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 Patient continuing on CVVHD with hypotension. Mental status has worsened somewhat today. Patient remains on vasopressor support despite broad spectrum antibiotics. Slow downward trend of cell counts likely secondary to chemotherapy. Overall I believe the patient is continuing to decline and communicated to her daughter that this quite probably will continue.    1. Recurrent Endometrial Cancer:  Oncology following. Known malignant ascites. Patient s/p fist dose of chemotherapy recently. Trending cell counts daily with CBC. Fentanyl IV prn. 2. Acute Encephalopathy:  Multifactorial. Previously improving but now more somnolent. Balancing comfort from abdominal pain versus worsening mentation with narcotics. 3. Acute Renal Failure:  Currently on CVVHD & tolerating well. Nephrology following. Assess daily for signs of recovery. Trending electrolytes & renal function twice daily. 4. Septic Shock: Still requiring low dose vasopressors. Day #7 of empiric Vancomycin & Zosyn. 5. Malignant Ascites:  Unable to perform paracentesis either by me or IR. 6. Pancytopenia:  Slowly worsening. No signs of active bleeding. Likely secondary to chemotherapy. Trending cell counts daily.  7. Possible Adrenal Insufficiency:  Discontinued stress dose steroids after 5 days on 2/13. 8. H/O DM:  BG controlled. Low dose  SSI algorithm & accu-checks to qAC & HS. 9. Elevated Troponin I:  Likely demand ischemia in setting of shock. No wall motion abnormalities on TTE to suggest MI. 10. Acute Hypoxic Respiratory Failure:  Resolved. Extubated 2/12. Continuing pulmonary toilette with IS. 11. Hyponatremia:  Resolved. 12. Prophylaxis:  Continue SCDs & Protonix PO daily. 13. Diet:  Continue diabetic diet. Continuing Glucerna. Nutrition consulted for recommendations. 14. Disposition:  Patient will remain as long as she needs CVVHD & pressors.  May transition to full palliative care this weekend. Palliative medicine following & meeting scheduled for today.  Family Updates: Family updated at bedside by Dr. Ashok Cordia 2/16.  I have spent a total of 37 minutes of critical care time today caring for the patient, reviewing the patient's electronic medical record, and discussing plan of care with family at bedside.  Sonia Baller Ashok Cordia, M.D. [facility] Pager:  [phone] After  3pm or if no response, call [phone] 01/18/2016, 9:40 AM

## 2016-01-18 NOTE — Progress Notes (Signed)
Pharmacy Antibiotic Note  Brittany Schwartz is a 65 y.o. female admitted on 01/11/2016 with sepsis/shock.  Pharmacy has been consulted for vancomycin and zosyn dosing.Today is Day #7 of antibiotics.  She remains on CRRT.  Noted to be hypothermic.  Less responsive today.    Plan:  Continue vancomycin 1gm IV q24h as trough is therapeutic . Discussed with CCM 2/14 re: stopping vancomycin but wishes to continue as remains on vasopressors.    Continue zosyn 3.375gm IV q6h for CRRT  Height: 5\' 2"  (157.5 cm) Weight: 160 lb 15 oz (73 kg) IBW/kg (Calculated) : 50.1  Temp (24hrs), Avg:97.1 F (36.2 C), Min:95.2 F (35.1 C), Max:97.9 F (36.6 C)   Recent Labs Lab 01/11/16 2109  01/14/16 0630  01/15/16 0415  01/16/16 0400 01/16/16 1600 01/17/16 0510 01/17/16 1600 01/18/16 0421 01/18/16 0835  WBC  --   < > 16.0*  --  26.5*  --  17.6*  --  8.5  --  2.3*  --   CREATININE 3.56*  < > 1.29*  < > 0.97  < > 1.02* 0.90 1.02* 0.74 0.93  --   LATICACIDVEN 3.0*  --   --   --   --   --   --   --   --   --   --   --   VANCOTROUGH  --   --   --   --   --   --   --   --   --   --   --  17  VANCORANDOM  --   --  11  --   --   --   --   --   --   --   --   --   < > = values in this interval not displayed.  Estimated Creatinine Clearance: 56.5 mL/min (by C-G formula based on Cr of 0.93).    No Known Allergies  Antimicrobials this admission: 2/9 vanc>> 2/9 zosyn>>   Levels/dose changes this admission: 2/10: with start of CRRT, changed Zosyn to 3.375g IV q6 and started Vanc dosing at 1g q24 2/16 0830 VT = 21mcg/ml on 1gm q24h (prior to 6th dose). Level drawn 1h early  Microbiology results: 2/10 MRSA PCR neg 2/9 urine: 8K insignificant growth  2/9 blood: NGTD  Thank you for allowing pharmacy to be a part of this patient's care.  Doreene Eland, PharmD, BCPS.   Pager: [phone] 01/18/2016 10:11 AM

## 2016-01-19 DIAGNOSIS — I959 Hypotension, unspecified: Secondary | ICD-10-CM

## 2016-01-19 DIAGNOSIS — K209 Esophagitis, unspecified: Secondary | ICD-10-CM

## 2016-01-19 DIAGNOSIS — B370 Candidal stomatitis: Secondary | ICD-10-CM | POA: Diagnosis present

## 2016-01-19 DIAGNOSIS — N19 Unspecified kidney failure: Secondary | ICD-10-CM

## 2016-01-19 DIAGNOSIS — D61810 Antineoplastic chemotherapy induced pancytopenia: Secondary | ICD-10-CM

## 2016-01-19 DIAGNOSIS — R188 Other ascites: Secondary | ICD-10-CM

## 2016-01-19 DIAGNOSIS — K121 Other forms of stomatitis: Secondary | ICD-10-CM

## 2016-01-19 LAB — CBC WITH DIFFERENTIAL/PLATELET
BASOS PCT: 2 %
Basophils Absolute: 0 10*3/uL (ref 0.0–0.1)
EOS ABS: 0 10*3/uL (ref 0.0–0.7)
Eosinophils Relative: 0 %
HCT: 21.9 % — ABNORMAL LOW (ref 36.0–46.0)
Hemoglobin: 7.3 g/dL — ABNORMAL LOW (ref 12.0–15.0)
LYMPHS ABS: 0.3 10*3/uL — AB (ref 0.7–4.0)
Lymphocytes Relative: 64 %
MCH: 29.8 pg (ref 26.0–34.0)
MCHC: 33.3 g/dL (ref 30.0–36.0)
MCV: 89.4 fL (ref 78.0–100.0)
MONO ABS: 0 10*3/uL — AB (ref 0.1–1.0)
Monocytes Relative: 10 %
NEUTROS ABS: 0.1 10*3/uL — AB (ref 1.7–7.7)
NEUTROS PCT: 24 %
PLATELETS: 20 10*3/uL — AB (ref 150–400)
RBC: 2.45 MIL/uL — ABNORMAL LOW (ref 3.87–5.11)
RDW: 13.5 % (ref 11.5–15.5)
WBC: 0.4 10*3/uL — CL (ref 4.0–10.5)

## 2016-01-19 LAB — RENAL FUNCTION PANEL
ALBUMIN: 1.7 g/dL — AB (ref 3.5–5.0)
ANION GAP: 9 (ref 5–15)
Albumin: 1.7 g/dL — ABNORMAL LOW (ref 3.5–5.0)
Anion gap: 8 (ref 5–15)
BUN: 13 mg/dL (ref 6–20)
BUN: 13 mg/dL (ref 6–20)
CALCIUM: 7.9 mg/dL — AB (ref 8.9–10.3)
CALCIUM: 8 mg/dL — AB (ref 8.9–10.3)
CHLORIDE: 103 mmol/L (ref 101–111)
CO2: 27 mmol/L (ref 22–32)
CO2: 26 mmol/L (ref 22–32)
CREATININE: 0.9 mg/dL (ref 0.44–1.00)
CREATININE: 0.89 mg/dL (ref 0.44–1.00)
Chloride: 102 mmol/L (ref 101–111)
GFR calc Af Amer: >60 mL/min (ref >60)
GFR calc non Af Amer: >60 mL/min (ref >60)
GFR calc non Af Amer: >60 mL/min (ref >60)
GLUCOSE: 184 mg/dL — AB (ref 65–99)
GLUCOSE: 187 mg/dL — AB (ref 65–99)
PHOSPHORUS: 1.8 mg/dL — AB (ref 2.5–4.6)
Phosphorus: 2 mg/dL — ABNORMAL LOW (ref 2.5–4.6)
Potassium: 4.3 mmol/L (ref 3.5–5.1)
Potassium: 4.4 mmol/L (ref 3.5–5.1)
SODIUM: 138 mmol/L (ref 135–145)
SODIUM: 137 mmol/L (ref 135–145)

## 2016-01-19 LAB — GLUCOSE, CAPILLARY
Glucose-Capillary: 199 mg/dL — ABNORMAL HIGH (ref 65–99)
Glucose-Capillary: 233 mg/dL — ABNORMAL HIGH (ref 65–99)
Glucose-Capillary: 165 mg/dL — ABNORMAL HIGH (ref 65–99)
Glucose-Capillary: 201 mg/dL — ABNORMAL HIGH (ref 65–99)

## 2016-01-19 LAB — MAGNESIUM: Magnesium: 2.4 mg/dL (ref 1.7–2.4)

## 2016-01-19 MED ORDER — FLUCONAZOLE IN SODIUM CHLORIDE 100-0.9 MG/50ML-% IV SOLN
100.0000 mg | INTRAVENOUS | Status: DC
  Filled 2016-01-19: qty 50

## 2016-01-19 MED ORDER — FLUCONAZOLE IN SODIUM CHLORIDE 400-0.9 MG/200ML-% IV SOLN
400.0000 mg | INTRAVENOUS | Status: DC
  Administered 2016-01-19 – 2016-01-22 (×4): 400 mg via INTRAVENOUS
  Filled 2016-01-19 (×5): qty 200

## 2016-01-19 MED ORDER — SUCRALFATE 1 GM/10ML PO SUSP
1.0000 g | Freq: Four times a day (QID) | ORAL | Status: DC
  Administered 2016-01-19 – 2016-01-30 (×43): 1 g via ORAL
  Filled 2016-01-19 (×44): qty 10

## 2016-01-19 MED ORDER — DEXTROSE 5 % IV SOLN: 5.0000 mg/kg | Freq: Three times a day (TID) | INTRAVENOUS | Status: DC

## 2016-01-19 MED ORDER — DEXTROSE 5 % IV SOLN
5.0000 mg/kg | INTRAVENOUS | Status: DC
  Administered 2016-01-19 – 2016-01-23 (×5): 360 mg via INTRAVENOUS
  Filled 2016-01-19 (×7): qty 7.2

## 2016-01-19 MED ORDER — LIDOCAINE VISCOUS 2 % MT SOLN
15.0000 mL | OROMUCOSAL | Status: DC | PRN
Start: 2016-01-19 — End: 2016-01-30
  Administered 2016-01-19 – 2016-01-22 (×3): 15 mL via OROMUCOSAL
  Filled 2016-01-19 (×5): qty 15

## 2016-01-19 MED ORDER — NYSTATIN 100000 UNIT/ML MT SUSP
5.0000 mL | Freq: Four times a day (QID) | OROMUCOSAL | Status: DC
  Administered 2016-01-19 – 2016-01-30 (×43): 500000 [IU] via ORAL
  Filled 2016-01-19 (×43): qty 5

## 2016-01-19 NOTE — Progress Notes (Addendum)
Pharmacy Antibiotic Note  Brittany Schwartz is a 65 y.o. female admitted on 01/11/2016 with sepsis/shock.  Pharmacy has been consulted for Vancomycin and Zosyn dosing. She remains on CRRT. Mucositis/stomatitis/esophagitis likely from adriamycin, possible thrush or HSV.  Plan:  Continue Vancomycin 1g IV q24h.    Continue Zosyn 3.375g IV q6h.  Start acyclovir 5mg /kg IV q24h x 7d.   Start fluconazole 400mg  IV q24h x7d.  All doses adjusted for CRRT.  Height: 5\' 2"  (157.5 cm) Weight: 159 lb 6.3 oz (72.3 kg) IBW/kg (Calculated) : 50.1  Temp (24hrs), Avg:97.3 F (36.3 C), Min:95.6 F (35.3 C), Max:97.9 F (36.6 C)   Recent Labs Lab 01/14/16 0630  01/15/16 0415  01/16/16 0400  01/17/16 0510 01/17/16 1600 01/18/16 0421 01/18/16 0835 01/18/16 1620 01/19/16 0400 01/19/16 0420  WBC 16.0*  --  26.5*  --  17.6*  --  8.5  --  2.3*  --   --   --  0.4*  CREATININE 1.29*  < > 0.97  < > 1.02*  < > 1.02* 0.74 0.93  --  0.86 0.90  --   VANCOTROUGH  --   --   --   --   --   --   --   --   --  17  --   --   --   VANCORANDOM 11  --   --   --   --   --   --   --   --   --   --   --   --   < > = values in this interval not displayed.  Estimated Creatinine Clearance: 58 mL/min (by C-G formula based on Cr of 0.9).    No Known Allergies  Antimicrobials this admission: 2/9 vanc >> 2/9 zosyn >>  2/17 acyclovir >> 2/17 fluconazole >>  Levels/dose changes this admission: 2/10: with start of CRRT, changed Zosyn to 3.375g IV q6 and started Vanc dosing at 1g q24 2/16 0830 VT = 41mcg/ml on 1gm q24h (prior to 6th dose). Level drawn 1h early  Microbiology results: 2/10 MRSA PCR neg 2/9 urine: 8K insignificant growth  2/9 blood: NGF  Thank you for allowing pharmacy to be a part of this patient's care.  Romeo Rabon, PharmD, pager [phone]. 01/19/2016,9:39 AM.

## 2016-01-19 NOTE — Progress Notes (Signed)
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  Now with rapid recurrent disease since mid January with large volume malignant ascites, bulky involvement pelvis and abdominal caking.  She is followed by Dr. Marko Plume with oncology and is currently undergoing chemo.  Family reports that oncologist feels as though there is hope for improvement with chemotherapy. She received last dose of chemotherapy 2/8. She had 2u PRBC transfusion 2/9 for symptomatic anemia.  During transfusion, she complained of SOB with diaphoresis.  She was subsequently sent to ED for further evaluation.  In ED, she was hypertensive and remained SOB.  She was therefore placed on BiPAP. CXR showed significant pulmonary edema. Laboratory assessment was significant for renal failure with K 7.2, Na 119 (chronically low), and SCr 3.61. She is also hyperglycemic with Glucose 369. Lab eval also notable for WBC 20.2. CT abdomen from a few days prior consistent with progressive mass size with compression of L ureter and L hydronephrosis. She was unable to tolerate BiPAP in ED and required intubation.  PCCM asked to admit.  Extensive discussion with family by Dr. Titus Mould.  Given [name]'s hx and current circumstances, decision made to limit code status to intubation and vasopressors only.  In the event of cardiac arrest, we would not put her through CPR or defibrillation.  Full medical management otherwise.  STUDIES:  TTE 1/31:  LV EF 65-70%. Normal wall motion. Grade 1 diastolic dysfunction. LA & RA normal in size. RV normal in size & function. PASP 21 mmHg. Normal aortic & mitral  valves. No pericardial effusion. Port CXR 2/11: No opacity. ETT in good position. R IJ CVL in good position.  TTE 2/11:  EF 65-70% w/ normal wall motion. Grade 1 diastolic dysfunction. RV normal in size & function. No effusion.  MICROBIOLOGY: Blood Ctx x2 2/9:  Negative  Urine Ctx 2/9:  Negative  Blood Ctx x2 2/3:  Negative  Urine Ctx 2/2:  Negative   ANTIBIOTICS: Vanc 02/09 > Zosyn 02/09 >  SIGNIFICANT EVENTS: 02/09 > admitted with acute hypoxic respiratory failure, multiple electrolyte derangements. 02/10 > started CVVHD 02/11 > ureteral stent placed 02/12 > extubated  LINES/TUBES: R IJ HD cath 02/09 > Foley 2/9 > R Chest Port 1/31 > OETT 7.5 02/09 - 2/12 OGT 2/9 - 2/12  SUBJECTIVE: No acute events overnight. Low dose fentanyl IV prn was restarted overnight for patient's comfort from throat & abdominal pain. Patient still having dysphagia. Denies any dyspnea or cough. No nausea.   REVIEW OF SYSTEMS:  Somnolent today but answering questions. No chest pain or pressure. No fever or sweats.  VITAL SIGNS: BP 92/34 mmHg  Pulse 76  Temp(Src) 95.5 F (35.3 C) (Oral)  Resp 23  Ht 5\' 2"  (1.575 m)  Wt 159 lb 6.3 oz (72.3 kg)  BMI 29.15 kg/m2  SpO2 100%  HEMODYNAMICS:    VENTILATOR SETTINGS:    INTAKE / OUTPUT: I/O last 3 completed shifts: In: 2761.5 [P.O.:659; I.V.:1267.5; Other:10; IV Piggyback:825] Out: [id number] [Urine:20; Other:5259]  PHYSICAL EXAMINATION: General:  Eyes closed & laying in bed. No acute distress.  Integument:  Warm & dry. No rash on exposed skin.  HEENT:  No scleral injection. Thrush present on tongue. Cardiovascular:  Regular rhythm. Anasarca worsening. Unable to appreciate JVD.  Pulmonary: Clear to auscultation bilaterally. Normal work of breathing on room air. Abdomen: Protuberant. Firm. Hypoactive bowel sounds. Mildly uncomfortable w/ palpation. Neurological:  Wakes up transiently to voice. Following commands.  LABS:  BMET  Recent Labs Lab  01/18/16 0421 01/18/16 1620 01/19/16 0400  NA 137 137 138  K 4.0 4.0 4.3  CL 101 101 102  CO2 26 27 27   BUN 13 13 13   CREATININE 0.93 0.86 0.90  GLUCOSE 198* 208* 184*    Electrolytes  Recent Labs Lab 01/17/16 0510  01/18/16 0421 01/18/16 1620 01/19/16 0400 01/19/16 0420  CALCIUM 7.5*  < > 7.7* 7.8* 7.9*  --   MG 2.4  --  2.4  --   --  2.4  PHOS 1.9*  < > 2.0* 2.1* 1.8*  --   < > = values in this interval not displayed.  CBC  Recent Labs Lab 01/17/16 0510 01/18/16 0421 01/19/16 0420  WBC 8.5 2.3* 0.4*  HGB 7.5* 7.5* 7.3*  HCT 22.4* 22.8* 21.9*  PLT 49* 31* 20*    Coag's No results for input(s): APTT, INR in the last 168 hours.  Sepsis Markers No results for input(s): LATICACIDVEN, PROCALCITON, O2SATVEN in the last 168 hours.  ABG No results for input(s): PHART, PCO2ART, PO2ART in the last 168 hours.  Liver Enzymes  Recent Labs Lab 01/18/16 0421 01/18/16 1620 01/19/16 0400  ALBUMIN 1.7* 1.6* 1.7*    Cardiac Enzymes No results for input(s): TROPONINI, PROBNP in the last 168 hours.  Glucose  Recent Labs Lab 01/18/16 0835 01/18/16 1328 01/18/16 1618 01/18/16 2102 01/19/16 0724 01/19/16 1124  GLUCAP 186* 199* 189* 201* 199* 233*    Imaging No results found.  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 Patient continuing on CVVHD with hypotension. Hypotension slowly worsening. Suspect patient's pain with swallowing secondary to oral thrush. Prognosis remains dismal. Patient's pancytopenia continuing to worsen.  1. Recurrent Endometrial Cancer:  Oncology following. Known malignant ascites. Patient s/p fist dose of chemotherapy recently. Trending cell counts daily with CBC. Fentanyl IV prn. 2. Acute Encephalopathy:  Multifactorial. Fluctuates and likely complicated by delirium. Balancing comfort from abdominal pain versus worsening mentation with narcotics. 3. Acute  Renal Failure:  Currently on CVVHD & tolerating well. Nephrology following.Trending electrolytes & renal function twice daily. 4. Septic Shock: Escalating vasopressor requirement. Day #8 of empiric Vancomycin & Zosyn. 5. Oral thrush: Starting nystatin swish and swallow 4 times a day. 6. Malignant Ascites:  Unable to perform paracentesis either by me or IR. 7. Pancytopenia:  Slowly worsening. No signs of active bleeding. Likely secondary to chemotherapy. Trending cell counts daily. Neutropenic precautions. 8. Possible Adrenal Insufficiency:  Discontinued stress dose steroids after 5 days on 2/13. 9. H/O DM:  BG controlled. Low dose SSI algorithm & accu-checks to qAC & HS. 10. Elevated Troponin I:  Likely demand ischemia in setting of shock. No wall motion abnormalities on TTE to suggest MI. 11. Acute Hypoxic Respiratory Failure:  Resolved. Extubated 2/12. Continuing pulmonary toilette with IS as mental status allows. 12. Hyponatremia:  Resolved. 13. Prophylaxis:  Continue SCDs & Protonix PO daily. 14. Diet:  Continue diabetic diet. Continuing Glucerna. Nutrition consulted for recommendations. 15. Disposition:  Patient will remain as long as she needs CVVHD & pressors.  May transition to full palliative care this weekend. Palliative medicine following &  meeting scheduled for today.  Family Updates: No family at bedside on 2/17.  I have spent a total of 33 minutes of critical care time today caring for the patient & reviewing the patient's electronic medical record.  Sonia Baller Ashok Cordia, M.D. [facility] Pulmonary & Critical Care Pager:  [phone] After 3pm or if no response, call [phone] 01/19/2016, 12:16 PM

## 2016-01-19 NOTE — Progress Notes (Signed)
eLink Physician-Brief Progress Note Patient Name: Brittany Schwartz DOB: 06/04/1951 MRN: [id number]   Date of Service  01/19/2016  HPI/Events of Note  Multiple issues: 1. WBC = 0.4 and Platelets = 20K. Patient is not bleeding and already on Granix. Defer platelet transfusion to Heme/Onc.   eICU Interventions  Will order: 1. Neutropenic isolation.      Intervention Category Intermediate Interventions: Diagnostic test evaluation  Sommer,Steven Cornelia Copa 01/19/2016, 5:12 AM

## 2016-01-19 NOTE — Progress Notes (Signed)
MEDICAL ONCOLOGY January 19, 2016, 9:06 AM  Hospital day 9 Antibiotics: vanc zosyn Day 1 diflucan Day 1 acyclovir Chemotherapy: first adriamycin 01-10-16 as salvage for recurrent endometrial carcinoma, day 10 cycle 1 today.  (adjuvant chemotherapy for IIIA high grade serous endometrial carcinoma completed 06-16-15) Granix day 6   Outpatient Physicians: [name]/ [name]; [name], MD (PCP, [facility]), Naoma Diener, [facility]  EMR reviewed, including Dr Kirstie Mirza note of 01-18-16 that family is requesting continuing present level of care but no escalation of care if situation worsens (continued vasopressor and CRRT, no CPR/ intubation/ Bipap.   Patient seen with daughter at bedside. Discussed with unit RN.  Subjective:  Rouses to voice, not verbal due to mouth/ throat pain. Denies SOB. Smiled when we mentioned family to arrive this weekend. Slept most of yesterday, a little more alert last pm per daughter  Has taken only tiny amount of sorbet due to stomatitis / esophagitis, no water po. Only bleeding is pink tinged secretions from oral suction. No bowel movement last 2 days. Does not seem more uncomfortable from abdominal distension. Tolerating CRRT. Does seem more comfortable with fentanyl infusion.   ONCOLOGIC HISTORY Paient presented to Dr [name] 01-03-15 with vaginal spotting x 1 year. Pelvic US showed 33 mm endometrial stripe with ovaries normal, and endometrial biopsy 01-03-15 grade 3 endometrial adenocarcinoma. CT CAP 01-13-15 had negative chest, no adenopathy, mild fatty liver, markedly thickened endometrium and no evidence of metastatic disease outside of uterus. CA 125 preoperatively on 01-13-15 was 22.9. Surgery by Dr [name] 01-19-15 was robotic hysterectomy, BSO and resection of nodule at sigmoid serosa. At surgery there was milial tumor studding with 5-10 mm implants on bladder peritoneum and miliary disease on pelvic sidewalls, without upper abdominal  disease evident, and omentum appeared normal. 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 Recommendation for 6 cycles of taxol carboplatin, then consideration of external beam RT + vaginal brachytherapy if CR. First carboplatin taxol was given 02-16-2015; she was neutropenic with ANC 0.7 by day 15 cycle 1. She was transfused 2 units PRBCs 5-19/5-20-16 for Hgb 7.8. Cycles 4 and 5 were both delayed due to thrombocytopenia. She completed chemotherapy 06-16-15 and vaginal brachytherapy30 gray in 5 fractions as of 09/14/2015. She did well thru end of 11-2015, including at gyn oncology exam 11-20-15, then presented to ED 12-29-15 with ~ 2 weeks progressive abdominal distension and pain, with large volume malignant ascites and large recurrent tumor burden. Cytology [id number] from ascites malignant cells consistent with gyn adenocarcinoma. She was hospitalized thru 01-07-16, with paracentesis for 4 liters bloody fluid on 1-28, for 3.5 liters on 01-02-16 and 4 liters on 01-09-16. Chemo was held 01-08-16 due to concerning exam, with bilateral LE venous dopplers no evidence of DVT and CT confirming large ascites (not appreciated on [place] 01-06-16) and bulky tumor without apparent bleed. Patient and family were counseled on very aggressive and advanced recurrent malignancy, understood that any treatment would be in attempt to control disease but not potential cure. Discussion then about life support and advance directives, specifically that such measures would not improve the underlying malignancy; patient and family wanted to consider that information. She requested attempt at treatment, had first adriamycin on 01-10-16 She was receiving PRBCs on 01-11-16 when developed respiratory distress, intubated, and has been supported in ICU with continuous dialysis and pressors.   Objective: Vital signs  in last  24 hours: Blood pressure 145/86, pulse 80, temperature 97.5 F (36.4 C), temperature source Axillary, resp. rate 21, height 5\' 2"  (1.575 m), weight 159 lb 6.3 oz (72.3 kg), SpO2 94 %. Opens eyes and cooperative with exam, drowses off as soon as exam completed. Not in any acute distress, tho uncomfortable mouth/ throat.  Pupils reactive, not icteric, mucous membranes pale. Oral mucosa and posterior pharynx extensive mucositis, may be thrush on tongue, ulcerations on soft palate. Respirations not labored RA, HOB at 60 degrees. Abdomen full but not quite tight, seems stable, no BS heard, not tender to gentle exam. Dialysis line left neck site ok no bleeding. PAC site ok no bleeding. LE 2+ swelling, SCD on. Foley in, a few cc brownish urine.    Intake/Output from previous day: 02/16 0701 - 02/17 0700 In: 2043.4 [P.O.:393; I.V.:988.4; IV Piggyback:652] Out: [id number] [Urine:10] Intake/Output this shift: Total I/O In: 359.5 [P.O.:35; I.V.:124.5; IV Piggyback:200] Out: 298 [Other:298]    Lab Results:  Recent Labs  01/18/16 0421 01/19/16 0420  WBC 2.3* 0.4*  HGB 7.5* 7.3*  HCT 22.8* 21.9*  PLT 31* 20*   BMET  Recent Labs  01/18/16 1620 01/19/16 0400  NA 137 138  K 4.0 4.3  CL 101 102  CO2 27 27  GLUCOSE 208* 184*  BUN 13 13  CREATININE 0.86 0.90  CALCIUM 7.8* 7.9*    Studies/Results: No results found.   MEDICATIONS:  Add diflucan, acyclovir, carafate slurry, prn viscous lidocaine Discussed acyclovir with pharmacist on unit now Remains on pressors    DISCUSSION:  Talked with daughter at bedside: Mucositis/ stomatitis/ esophagitis likely from adriamycin, thrush, possibly HSV. Agree with IV pain medication and will add diflucan and acyclovir (pharmacy to confirm dosing) now. Try carafate slurry and prn viscous lidocaine.  Counts likely at or near nadir, and certainly she is very weak and more immunocompromised from the chemo pancytopenia.  Continue gCSF, tho ANC may  drop further or not recover for several more days even with this ongoing. Would transfuse platelets if active bleeding or if < about 15K given dialysis lines. I will defer PRBCs to CCM for now, from my standpoint not obviously more symptomatic from this anemia now.   Assessment/Plan: 1.High grade endometrial carcinoma: rapidly recurrent in past ~ 4 weeks with large volume malignant ascites and extensive, bulky metastatic disease in abdomen and pelvis. 3 large volume paracenteses with bloody malignant ascites thru 01-09-16. Received first adriamycin on 01-10-16, in attempt to improve/ control the disease, however this is not situation of potential cure. BY [place] 01-16-16 not enough ascites in appropriate location for paracentesis. Counts continue to drop from recent chemotherapy, likely not at nadir as she is just day 8 cycle 1 adriamycin today. Agree with holding prophylactic heparin for platelets today 49K (chemo thrombocytopenia). On granix, continue. Would transfuse PRBCs if symptomatic/ if continuing this level of support, anemia related to chemo, bloody ascites, renal failure. I have told family that benefit from this chemo likely will not be apparent for another 1-2 weeks at best and will have to get thru nadir counts also.  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  2.Acute hypoxic respiratory failure 01-11-16: breathing comfortable since extubation. Large ascites was also restricting breathing prior to intubation. 3.acute renal failure: on continuous dialysis. Bilateral ureteral stents placed 01-13-16, no improvement in urine output, certainly third spacing  significant amount. Electrolyte abnormalities improved with CRRT 4.Chemotherapy pancytopenia: counts likely at or near nadir. Continue gCSF, neutropenic precautions.  Certainly at high risk infection in ICU setting. Anemia related to chemo, bloody ascites: transfuse PRBCs if symptomatic or otherwise indicated per CCM as long as continuing that level of support. Thrombocytopenia consistent with chemo, still dropping, would transfuse platelets if significant bleeding, holding heparin.  5.Stomatitis, esophagitis: consistent with adriamycin/ thrush/ possibly viral. Begin diflucan, acyclovir dosed for renal, other topicals for symptoms, continue IV fentanyl. 6.hypotension: still requiring pressor 7.Diabetes covering with insulin 8.Severe protein calorie malnutrition. TNA would not benefit underlying extensive malignancy. 9. flu vaccine 01-08-16 10. PAC , dialysis lines 11..Limited code more appropriate than full code in situation of underlying advanced malignancy. If worsens, DNR and comfort care would also be appropriate given underlying situation. Appreciate palliative care team following.    Will follow with you. Please page me if I can assist between my rounds [phone], or med onc on call   [place]

## 2016-01-19 NOTE — Progress Notes (Signed)
CRITICAL VALUE ALERT  Critical value received:  WBC 0.4; platelets 20  Date of notification:  01/19/16  Time of notification:  0506  Critical value read back:Yes.    Nurse who received alert:  Reche Dixon  MD notified (1st page):  Sommer  Time of first page:  0507  MD notified (2nd page):  Time of second page:  Responding MD:  Oletta Darter  Time MD responded:  [phone]

## 2016-01-19 NOTE — Progress Notes (Signed)
[facility] Progress Note   Subjective:  Tolerating  CRRT  No filter issues No heparin 4K dialysate/4K replacement fluids Getting off a little fluid but norepi is up to 12 Weight down to 72.3 kg (max around 78 kg)   Filed Vitals:   01/19/16 1600 01/19/16 1615 01/19/16 1630 01/19/16 1645  BP: 92/44 118/34 96/36 110/50  Pulse: 86     Temp: 98 F (36.7 C)     TempSrc: Axillary     Resp: 21     Height:      Weight:      SpO2: 97%      Exam: BP 110/50 mmHg  Pulse 86  Temp(Src) 98 F (36.7 C) (Axillary)  Resp 21  Ht 5\' 2"  (1.575 m)  Wt 72.3 kg (159 lb 6.3 oz)  BMI 29.15 kg/m2  SpO2 97%  Awake, alert Supine, no oxygen No distress No jvd Line left IJ for CRRT (2/9) Chest clear  Regular, HR 80's Abd mod-severe ascites, nontender - very tight - + abd wall edema 2-3+ bilat LE pitting edema to hips a little softer  Inpatient medications: . acyclovir  5 mg/kg Intravenous Q24H  . antiseptic oral rinse  7 mL Mouth Rinse BID  . feeding supplement (GLUCERNA SHAKE)  237 mL Oral TID BM  . fluconazole (DIFLUCAN) IV  400 mg Intravenous Q24H  . insulin aspart  0-5 Units Subcutaneous QHS  . insulin aspart  0-9 Units Subcutaneous TID WC  . latanoprost  1 drop Both Eyes QHS  . nystatin  5 mL Oral QID  . pantoprazole  40 mg Oral Daily  . piperacillin-tazobactam  3.375 g Intravenous 4 times per day  . sodium chloride flush  10-40 mL Intracatheter Q12H  . sodium phosphate  Dextrose 5% IVPB  10 mmol Intravenous Q1200  . sucralfate  1 g Oral 4 times per day  . Tbo-filgastrim (GRANIX) SQ  300 mcg Subcutaneous Daily  . timolol  1 drop Both Eyes BID  . vancomycin  1,000 mg Intravenous Q24H  Infusions: . norepinephrine (LEVOPHED) Adult infusion 12 mcg/min (01/19/16 1600)  . dialysis replacement fluid (prismasate) 300 mL/hr at 01/19/16 0122  . dialysis replacement fluid (prismasate) 500 mL/hr at 01/19/16 0800  . dialysate (PRISMASATE) 1,500 mL/hr at 01/19/16 1657    Prn medications sodium chloride, fentaNYL (SUBLIMAZE) injection, heparin, heparin, lidocaine, LORazepam, phenol, sodium chloride flush    Recent Labs Lab 01/18/16 1620 01/19/16 0400 01/19/16 1600  NA 137 138 137  K 4.0 4.3 4.4  CL 101 102 103  CO2 27 27 26   GLUCOSE 208* 184* 187*  BUN 13 13 13   CREATININE 0.86 0.90 0.89  CALCIUM 7.8* 7.9* 8.0*  PHOS 2.1* 1.8* 2.0*    Recent Labs Lab 01/18/16 1620 01/19/16 0400 01/19/16 1600  ALBUMIN 1.6* 1.7* 1.7*    Recent Labs Lab 01/17/16 0510 01/18/16 0421 01/19/16 0420  WBC 8.5 2.3* 0.4*  NEUTROABS 8.2* 1.9 0.1*  HGB 7.5* 7.5* 7.3*  HCT 22.4* 22.8* 21.9*  MCV 91.4 91.9 89.4  PLT 49* 31* 20*    CXR 2/11 > normal cxr CT chest 2/9 > perihilar edema, bibasilar consolidation L> R ECHO 1/31 > normal LV/ RV     Assessment: 1. AKI  (contrast/ hypotension/ L hydro/ larg vol paracentesis x 2) - anuric.  CRRT D#8. Tolerating well. Marked anasarca of LE's and malignant ascites; clear CXR.  Exam a little better though still extensive 3rd spaced fluid primarily due to low alb 1.5,  cap leak from malignancy. Bilateral ureteral stents in place (2/11).  Not a candidate for long-term dialysis given severe comorbidities, family and patient are all aware (our partner Dr. Elmarie Shiley has also spoken with family member about non-candidacy for long term dialysis).  No evidence for any return of renal function.   Remains on norepi. Dose has escalated some in the last 24 hours  Continue to try to pull some fluid as she will tolerate - goal range of 0-75 neg/hour so that nursing can pull if hourly BP's allow. K stable and is on standing daily dose of sodium phosphate 10 mmoles 2. Recurrent metastatic endometrial cancer- recurrent, s/p adriamycin 2/8 as salvage for recurrent disease, not a situation of potential cure per oncology. Dr. Marko Plume recommended peritoneal catheter to help keep ascites decompressed. Last paracentesis was 2/11 - 4 liters. [place] 2/14  yielded no safe sites for paracentesis 3. Septic shock- remains on vanco, zosyn, and vasopressor 4. Pancytopenia - continued drop in WBC, plts. Hb stable around 7. 5. Acute hypoxic resp failure - resolve 6. DM on oral agent 7. Malnutrition/ hypoalbuminemia 8. Anemia 9. Disposition - limited code. Additional family coming in over the weekend. No change in level of care at this time.  Jamal Maes, MD [facility] [phone] Pager 01/19/2016, 5:09 PM

## 2016-01-19 NOTE — Progress Notes (Signed)
Nutrition Brief Note  Per rounds, PT's family is waiting for family from [place] to arrive late Saturday before further palliative care discussions on Monday. They are choosing no heroic care at this time. Pt is still on pressors and CRRT. Continue to follow for needs.  Brittany Schwartz. Lajuana Patchell, MS, RD LDN After Hours/Weekend Pager [phone]

## 2016-01-20 LAB — CBC WITH DIFFERENTIAL/PLATELET
Basophils Absolute: 0 10*3/uL (ref 0.0–0.1)
Basophils Relative: 0 %
EOS PCT: 0 %
Eosinophils Absolute: 0 10*3/uL (ref 0.0–0.7)
HEMATOCRIT: 20.5 % — AB (ref 36.0–46.0)
Hemoglobin: 6.9 g/dL — CL (ref 12.0–15.0)
LYMPHS PCT: 63 %
Lymphs Abs: 0.3 10*3/uL — ABNORMAL LOW (ref 0.7–4.0)
MCH: 30.7 pg (ref 26.0–34.0)
MCHC: 33.7 g/dL (ref 30.0–36.0)
MCV: 91.1 fL (ref 78.0–100.0)
MONO ABS: 0.1 10*3/uL (ref 0.1–1.0)
MONOS PCT: 19 %
NEUTROS ABS: 0.1 10*3/uL — AB (ref 1.7–7.7)
Neutrophils Relative %: 19 %
Platelets: 40 10*3/uL — ABNORMAL LOW (ref 150–400)
RBC: 2.25 MIL/uL — ABNORMAL LOW (ref 3.87–5.11)
RDW: 13.8 % (ref 11.5–15.5)
WBC: 0.4 10*3/uL — CL (ref 4.0–10.5)

## 2016-01-20 LAB — RENAL FUNCTION PANEL
ALBUMIN: 1.5 g/dL — AB (ref 3.5–5.0)
ANION GAP: 9 (ref 5–15)
Albumin: 1.6 g/dL — ABNORMAL LOW (ref 3.5–5.0)
Anion gap: 7 (ref 5–15)
BUN: 12 mg/dL (ref 6–20)
BUN: 11 mg/dL (ref 6–20)
CHLORIDE: 101 mmol/L (ref 101–111)
CO2: 27 mmol/L (ref 22–32)
CO2: 25 mmol/L (ref 22–32)
Calcium: 7.3 mg/dL — ABNORMAL LOW (ref 8.9–10.3)
Calcium: 7.3 mg/dL — ABNORMAL LOW (ref 8.9–10.3)
Chloride: 100 mmol/L — ABNORMAL LOW (ref 101–111)
Creatinine, Ser: 0.94 mg/dL (ref 0.44–1.00)
Creatinine, Ser: 0.93 mg/dL (ref 0.44–1.00)
GFR calc Af Amer: >60 mL/min (ref >60)
GFR calc Af Amer: >60 mL/min (ref >60)
GFR calc non Af Amer: >60 mL/min (ref >60)
GFR calc non Af Amer: >60 mL/min (ref >60)
GLUCOSE: 177 mg/dL — AB (ref 65–99)
GLUCOSE: 281 mg/dL — AB (ref 65–99)
PHOSPHORUS: 1.8 mg/dL — AB (ref 2.5–4.6)
POTASSIUM: 4.9 mmol/L (ref 3.5–5.1)
POTASSIUM: 4 mmol/L (ref 3.5–5.1)
Phosphorus: 1.9 mg/dL — ABNORMAL LOW (ref 2.5–4.6)
Sodium: 135 mmol/L (ref 135–145)
Sodium: 134 mmol/L — ABNORMAL LOW (ref 135–145)

## 2016-01-20 LAB — GLUCOSE, CAPILLARY
GLUCOSE-CAPILLARY: 190 mg/dL — AB (ref 65–99)
GLUCOSE-CAPILLARY: 193 mg/dL — AB (ref 65–99)
GLUCOSE-CAPILLARY: 182 mg/dL — AB (ref 65–99)
Glucose-Capillary: 170 mg/dL — ABNORMAL HIGH (ref 65–99)

## 2016-01-20 LAB — MAGNESIUM: MAGNESIUM: 2.3 mg/dL (ref 1.7–2.4)

## 2016-01-20 MED ORDER — DEXTROSE 5 % IV SOLN
20.0000 mmol | Freq: Every day | INTRAVENOUS | Status: DC
Start: 2016-01-21 — End: 2016-01-21
  Filled 2016-01-20: qty 6.67

## 2016-01-20 NOTE — Progress Notes (Signed)
Chaplain referred to room by nurse. [name]'s daughter indicates she is fine and thanked the chaplain for the visit  Brittany Schwartz. Brittany Schwartz, DMin, MDiv, MA Chaplain

## 2016-01-20 NOTE — Progress Notes (Signed)
[facility] Progress Note   Subjective:  Tolerating  CRRT  No filter issues No heparin 4K dialysate/4K replacement fluids Have been able to get off about 10 kg so far since start of CRRT Norepi is at 12  More family in Pt says no sig pain    Filed Vitals:   01/20/16 1206 01/20/16 1238 01/20/16 1300 01/20/16 1400  BP: 108/44  111/57 110/48  Pulse: 70  81 74  Temp:  98.1 F (36.7 C)    TempSrc:      Resp: 16  18 15   Height:      Weight:      SpO2: 100%  99% 100%   Exam: BP 110/48 mmHg  Pulse 74  Temp(Src) 98.1 F (36.7 C) (Oral)  Resp 15  Ht 5\' 2"  (1.575 m)  Wt 68.8 kg (151 lb 10.8 oz)  BMI 27.73 kg/m2  SpO2 100%  Awake, a little more subdued today Supine, no oxygen No distress No jvd Line left IJ for CRRT (2/9) Chest clear  Regular, HR 80's Abd mod-severe ascites, nontender - very tight - + abd wall edema 2-3+ bilat LE pitting edema to hips - edema quite a bit better - still very substantial but less hard and tight No urine  Inpatient medications: . acyclovir  5 mg/kg Intravenous Q24H  . antiseptic oral rinse  7 mL Mouth Rinse BID  . feeding supplement (GLUCERNA SHAKE)  237 mL Oral TID BM  . fluconazole (DIFLUCAN) IV  400 mg Intravenous Q24H  . insulin aspart  0-5 Units Subcutaneous QHS  . insulin aspart  0-9 Units Subcutaneous TID WC  . latanoprost  1 drop Both Eyes QHS  . nystatin  5 mL Oral QID  . pantoprazole  40 mg Oral Daily  . piperacillin-tazobactam  3.375 g Intravenous 4 times per day  . sodium chloride flush  10-40 mL Intracatheter Q12H  . sodium phosphate  Dextrose 5% IVPB  10 mmol Intravenous Q1200  . sucralfate  1 g Oral 4 times per day  . Tbo-filgastrim (GRANIX) SQ  300 mcg Subcutaneous Daily  . timolol  1 drop Both Eyes BID  . vancomycin  1,000 mg Intravenous Q24H  Infusions: . norepinephrine (LEVOPHED) Adult infusion 12 mcg/min (01/20/16 1400)  . dialysis replacement fluid (prismasate) 300 mL/hr at 01/20/16 1141  .  dialysis replacement fluid (prismasate) 500 mL/hr at 01/20/16 1405  . dialysate (PRISMASATE) 1,500 mL/hr at 01/20/16 0943   Prn medications sodium chloride, fentaNYL (SUBLIMAZE) injection, heparin, heparin, lidocaine, LORazepam, phenol, sodium chloride flush    Recent Labs Lab 01/19/16 0400 01/19/16 1600 01/20/16 0600  NA 138 137 135  K 4.3 4.4 4.9  CL 102 103 101  CO2 27 26 27   GLUCOSE 184* 187* 177*  BUN 13 13 12   CREATININE 0.90 0.89 0.94  CALCIUM 7.9* 8.0* 7.3*  PHOS 1.8* 2.0* 1.9*    Recent Labs Lab 01/19/16 0400 01/19/16 1600 01/20/16 0600  ALBUMIN 1.7* 1.7* 1.6*    Recent Labs Lab 01/18/16 0421 01/19/16 0420 01/20/16 0600  WBC 2.3* 0.4* 0.4*  NEUTROABS 1.9 0.1* 0.1*  HGB 7.5* 7.3* 6.9*  HCT 22.8* 21.9* 20.5*  MCV 91.9 89.4 91.1  PLT 31* 20* 40*    CXR 2/11 > normal cxr CT chest 2/9 > perihilar edema, bibasilar consolidation L> R ECHO 1/31 > normal LV/ RV     Assessment: 1. AKI  (contrast/ hypotension/ L hydro/ larg vol paracentesis x 2) - anuric.  CRRT D#9. Tolerating  well. Weight down about 10 kg but still with marked anasarca of LE's and malignant ascites; clear CXR.  Exam has improved some despite still extensive 3rd spaced fluid primarily due to low alb 1.5, cap leak from malignancy. Bilateral ureteral stents in place (2/11).  Not a candidate for long-term dialysis given severe comorbidities, family and patient are all aware (our partner Dr. Elmarie Shiley has also spoken with family member about non-candidacy for long term dialysis).  No evidence at this time for any return of renal function.    Continue to try to pull some fluid as she will tolerate - goal range of 0-75 neg/hour so that nursing can pull if hourly BP's allow. My goal would be to try and get a little more volume off even if that means an increase in the NE because she will be more comfortable. K stable and is on standing daily dose of sodium phosphate 10 mmoles which I will increase to 20  mmoles as phos still borderline. 2. Recurrent metastatic endometrial cancer- recurrent, s/p adriamycin 2/8 as salvage for recurrent disease, not a situation of potential cure per oncology. Dr. Marko Plume recommended peritoneal catheter to help keep ascites decompressed. Last paracentesis was 2/11 - 4 liters. [place] 2/14 yielded no safe sites for paracentesis 3. Septic shock- remains on vanco, zosyn, and vasopressor 4. Pancytopenia - continued drop in WBC, plts. Hb down to 6.9. 5. Acute hypoxic resp failure - resolved 6. DM on oral agent 7. Malnutrition/ hypoalbuminemia 8. Anemia 9. Disposition - limited code. Additional family coming in over the weekend. No change in level of care at this time.  Jamal Maes, MD [facility] [phone] Pager 01/20/2016, 2:43 PM

## 2016-01-20 NOTE — Progress Notes (Signed)
MEDICAL ONCOLOGY January 20, 2016, 8:37 AM  Hospital day 10 Antibiotics: vanc zosyn Day 2 acyclovir, diflucan Chemotherapy:  first adriamycin 01-10-16 as salvage for recurrent endometrial carcinoma, day 11 cycle 1 today.  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 Did sleep last pm. Denies SOB still in bed. Not passing flatus. Denies increased abdominal discomfort. Denies nausea. No noted bleeding. No new or different pain. Denies discomfort from foley.    ONCOLOGIC HISTORY Paient presented to Dr [name] 01-03-15 with vaginal spotting x 1 year. Pelvic US showed 33 mm endometrial stripe with ovaries normal, and endometrial biopsy 01-03-15 grade 3 endometrial adenocarcinoma. CT CAP 01-13-15 had negative chest, no adenopathy, mild fatty liver, markedly thickened endometrium and no evidence of metastatic disease outside of uterus. CA 125 preoperatively on 01-13-15 was 22.9. Surgery by Dr [name] 01-19-15 was robotic hysterectomy, BSO and resection of nodule at sigmoid serosa. At surgery there was milial tumor studding with 5-10 mm implants on bladder peritoneum and miliary disease on pelvic sidewalls, without upper abdominal disease evident, and omentum appeared normal. 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 Recommendation for 6 cycles of taxol carboplatin, then consideration of external beam RT + vaginal brachytherapy if CR. First  carboplatin taxol was given 02-16-2015; she was neutropenic with ANC 0.7 by day 15 cycle 1. She was transfused 2 units PRBCs 5-19/5-20-16 for Hgb 7.8. Cycles 4 and 5 were both delayed due to thrombocytopenia. She completed chemotherapy 06-16-15 and vaginal brachytherapy30 gray in 5 fractions as of 09/14/2015. She did well thru end of 11-2015, including at gyn oncology exam 11-20-15, then presented to ED 12-29-15 with ~ 2 weeks progressive abdominal distension and pain, with large volume malignant ascites and large recurrent tumor burden. Cytology [id number] from ascites malignant cells consistent with gyn adenocarcinoma. She was hospitalized thru 01-07-16, with paracentesis for 4 liters bloody fluid on 1-28, for 3.5 liters on 01-02-16 and 4 liters on 01-09-16. Chemo was held 01-08-16 due to concerning exam, with bilateral LE venous dopplers no evidence of DVT and CT confirming large ascites (not appreciated on [place] 01-06-16) and bulky tumor without apparent bleed. Patient and family were counseled on very aggressive and advanced recurrent malignancy, understood that any treatment would be in attempt to control disease but not potential cure. Discussion then about life support and advance directives, specifically that such measures would not improve the underlying malignancy; patient and family wanted to consider that information. She requested attempt at treatment, had first adriamycin on 01-10-16 She was receiving PRBCs on 01-11-16 when developed respiratory distress, intubated, and has been supported in ICU with continuous dialysis and pressors.   Objective: Vital signs in last 24 hours: Blood pressure 106/45, pulse 77, temperature 97.5 F (36.4 C), temperature source Oral, resp. rate 18, height 5\' 2"  (1.575 m), weight 151 lb 10.8 oz (68.8 kg), SpO2 100 %. Has been afebrile  More awake and alert today, speaking a little, very softly. Does not appear in acute discomfort and breathing comfortable on RN.  PERRL, not  icteric.  Oral mucosa and posterior pharynx/ soft palate less thrush and less mucositis. Mucous membranes pale. Dialysis line left neck, site ok, no bleeding. PAC accessed, site ok, no bleeding. Heart RRR no gallop. Lungs without wheezes or rales anteriorly. Abdomen full but not quite tight, not tender to gentle exam, quiet. Foley in, no urine in tubing, per RN 5 cc this AM. LE still 2+ swelling to thighs, SCDs on, feet warm. Left hand still edematous tho slightly better.    Intake/Output from previous day: 02/17 0701 - 02/18 0700 In: 2136.7 [P.O.:45; I.V.:1195.7; IV Piggyback:896] Out: 3769 [Urine:5] Intake/Output this shift: Total I/O In: 48.5 [I.V.:48.5] Out: 72 [Urine:5; Other:67]    Lab Results:  Recent Labs  01/19/16 0420 01/20/16 0600  WBC 0.4* 0.4*  HGB 7.3* 6.9*  HCT 21.9* 20.5*  PLT 20* 40*   BMET  Recent Labs  01/19/16 1600 01/20/16 0600  NA 137 135  K 4.4 4.9  CL 103 101  CO2 26 27  GLUCOSE 187* 177*  BUN 13 12  CREATININE 0.89 0.94  CALCIUM 8.0* 7.3*    Studies/Results: No results found.   DISCUSSION  Told patient and family member that platelet count a little better today tho still quite low, often is first of the blood counts to begin to recover after chemo. Talked with them about PRBC transfusion, as hemoglobin is <7.0; they want to talk with daughters about this before deciding. Have explained that WBC still extremely low, continuing granix, extremely high risk for infection particularly with lines and mucositis.   As still supporting, including with CRRT, pressors, antibiotics etc, I would be in favor of PRBC transfusion, which will not change long term outcome but may improve strength a little for family arriving from [place] tonight and may help with BP. Suggest removing foley due to profound neutropenia, if not actually necessary now (perhaps could do bladder scans?).   Assessment/Plan: 1.High grade endometrial carcinoma: rapidly recurrent in past  ~ 4 weeks with large volume malignant ascites and extensive, bulky metastatic disease in abdomen and pelvis. 3 large volume paracenteses with bloody malignant ascites thru 01-09-16. Received first adriamycin on 01-10-16, in attempt to improve/ control the disease, however this is not situation of potential cure. Ascites has not reaccumulated as quickly as prior to first adriamycin. Counts at or just past nadir from chemo, profoundly neutropenic tho not febrile despite mucositis and lines/ foley, hemoglobin <7, platelets a little better today without transfusion so may be just starting to recover. Continue granix, transfuse platelets if significant bleeding, suggest PRBCs 1-2 units if patient/ family agree.   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  2.Acute hypoxic respiratory failure 01-11-16: breathing still comfortable off of vent x several days. Large ascites was also restricting breathing prior to intubation. 3.acute renal failure: on continuous dialysis. Bilateral ureteral stents placed 01-13-16, no improvement in urine output, certainly third spacing significant amount. Electrolyte abnormalities improved with CRRT. Please DC foley if not needed due to profound neutropenia 4.Chemotherapy pancytopenia: counts likely at nadir or just past. Continue gCSF, neutropenic precautions. Certainly at extremely high risk infection in ICU setting and with mucositis. Fact that stomatitis is improving suggests some early improvement in WBC not picked up yet on lab. Anemia related to chemo, bloody ascites, renal failure. Thrombocytopenia consistent with chemo,  would transfuse platelets if significant bleeding, holding heparin.  5.Stomatitis,  esophagitis: consistent with adriamycin/ thrush/ possibly viral. Stomatitis clearly better in last 24  hrs. Continue diflucan, acyclovir dosed for renal, other topicals for symptoms, continue IV fentanyl. 6.hypotension: still requiring pressor 7.Diabetes covering with insulin 8.Severe protein calorie malnutrition. TNA would not benefit underlying extensive malignancy. 9. flu vaccine 01-08-16 10. PAC , dialysis lines 11..Limited code more appropriate than full code in situation of underlying advanced malignancy. If worsens, DNR and comfort care would also be appropriate given underlying situation. Appreciate palliative care team following. Family from [place] to arrive tonight.  Please page me if I can help between my rounds  Pager [phone] , or med onc on call LIVESAY,LENNIS P

## 2016-01-20 NOTE — Progress Notes (Addendum)
Clarified with physician about goals of CRRT and patient fluid volume status. Per Nephrology MD: Maintain BP with vasopressors and continue to pull off 0 to 75 ml/hr of fluid to help decrease patient fluid volume and increase patient comfort. Do not try to turn off pressors.  Will continue to monitor patient.

## 2016-01-20 NOTE — Progress Notes (Signed)
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  Now with rapid recurrent disease since mid January with large volume malignant ascites, bulky involvement pelvis and abdominal caking.  She is followed by Dr. Marko Plume with oncology and is currently undergoing chemo.  Family reports that oncologist feels as though there is hope for improvement with chemotherapy. She received last dose of chemotherapy 2/8. She had 2u PRBC transfusion 2/9 for symptomatic anemia.  During transfusion, she complained of SOB with diaphoresis.  She was subsequently sent to ED for further evaluation.  In ED, she was hypertensive and remained SOB.  She was therefore placed on BiPAP. CXR showed significant pulmonary edema. Laboratory assessment was significant for renal failure with K 7.2, Na 119 (chronically low), and SCr 3.61. She is also hyperglycemic with Glucose 369. Lab eval also notable for WBC 20.2. CT abdomen from a few days prior consistent with progressive mass size with compression of L ureter and L hydronephrosis. She was unable to tolerate BiPAP in ED and required intubation.  PCCM asked to admit.  Extensive discussion with family by Dr. Titus Mould.  Given Mrs. Reardon's hx and current circumstances, decision made to limit code status to intubation and vasopressors only.  In the event of cardiac arrest, we would not put her through CPR or defibrillation.  Full medical management otherwise.  STUDIES:  TTE 1/31:  LV EF 65-70%. Normal wall motion. Grade 1 diastolic dysfunction. LA & RA normal in size. RV normal in size & function. PASP 21 mmHg. Normal aortic & mitral  valves. No pericardial effusion. Port CXR 2/11: No opacity. ETT in good position. R IJ CVL in good position.  TTE 2/11:  EF 65-70% w/ normal wall motion. Grade 1 diastolic dysfunction. RV normal in size & function. No effusion.  MICROBIOLOGY: Blood Ctx x2 2/9:  Negative  Urine Ctx 2/9:  Negative  Blood Ctx x2 2/3:  Negative  Urine Ctx 2/2:  Negative   ANTIBIOTICS: Vanc 02/09 > Zosyn 02/09 >  SIGNIFICANT EVENTS: 02/09 > admitted with acute hypoxic respiratory failure, multiple electrolyte derangements. 02/10 > started CVVHD 02/11 > ureteral stent placed 02/12 > extubated  LINES/TUBES: R IJ HD cath 02/09 > Foley 2/9 > R Chest Port 1/31 > OETT 7.5 02/09 - 2/12 OGT 2/9 - 2/12  SUBJECTIVE:  Awake and interacting,  No significant changes o/n CVVHD running  REVIEW OF SYSTEMS:   C/o throat soreness, dryness  VITAL SIGNS: BP 106/45 mmHg  Pulse 77  Temp(Src) 97.5 F (36.4 C) (Oral)  Resp 18  Ht 5\' 2"  (1.575 m)  Wt 68.8 kg (151 lb 10.8 oz)  BMI 27.73 kg/m2  SpO2 100%  HEMODYNAMICS:    VENTILATOR SETTINGS:    INTAKE / OUTPUT: I/O last 3 completed shifts: In: 3079.4 [P.O.:208; I.V.:1823.4; Other:10; IV [id number] Out: [id number] [Urine:5; Other:5621]  PHYSICAL EXAMINATION: General:  Awake, laying in bed. No acute distress.  Integument:  Warm & dry. No rash on exposed skin.  HEENT:  No scleral injection. Thrush present on tongue. Cardiovascular:  Regular rhythm. Anasarca worsening. Unable to appreciate JVD.  Pulmonary: Clear to auscultation bilaterally. Normal work of breathing on room air. Abdomen:  Protuberant. Firm. Hypoactive bowel sounds. Mildly uncomfortable w/ palpation. Neurological:  Awake.  Following commands.  LABS:  BMET  Recent Labs Lab 01/19/16 0400 01/19/16 1600 01/20/16 0600  NA 138 137 135  K 4.3 4.4 4.9  CL 102 103 101  CO2 27 26 27   BUN 13 13 12   CREATININE 0.90 0.89 0.94  GLUCOSE 184* 187* 177*    Electrolytes  Recent Labs Lab  01/18/16 0421  01/19/16 0400 01/19/16 0420 01/19/16 1600 01/20/16 0600  CALCIUM 7.7*  < > 7.9*  --  8.0* 7.3*  MG 2.4  --   --  2.4  --  2.3  PHOS 2.0*  < > 1.8*  --  2.0* 1.9*  < > = values in this interval not displayed.  CBC  Recent Labs Lab 01/18/16 0421 01/19/16 0420 01/20/16 0600  WBC 2.3* 0.4* 0.4*  HGB 7.5* 7.3* 6.9*  HCT 22.8* 21.9* 20.5*  PLT 31* 20* 40*    Coag's No results for input(s): APTT, INR in the last 168 hours.  Sepsis Markers No results for input(s): LATICACIDVEN, PROCALCITON, O2SATVEN in the last 168 hours.  ABG No results for input(s): PHART, PCO2ART, PO2ART in the last 168 hours.  Liver Enzymes  Recent Labs Lab 01/19/16 0400 01/19/16 1600 01/20/16 0600  ALBUMIN 1.7* 1.7* 1.6*    Cardiac Enzymes No results for input(s): TROPONINI, PROBNP in the last 168 hours.  Glucose  Recent Labs Lab 01/18/16 1618 01/18/16 2102 01/19/16 0724 01/19/16 1124 01/19/16 1557 01/19/16 2151  GLUCAP 189* 201* 199* 233* 165* 201*    Imaging No results found.  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 Patient continuing on CVVHD with hypotension (on norepi). Suspect patient's pain with swallowing secondary to oral thrush. Prognosis dismal. Patient's pancytopenia continuing to worsen.  1. Recurrent Endometrial Cancer:  Oncology following. Known malignant ascites. Patient s/p fist dose of chemotherapy recently. Trending cell counts daily with CBC. Fentanyl IV prn. 2. Acute Encephalopathy:  Multifactorial. Fluctuates and likely complicated by delirium. Balancing comfort from abdominal pain versus worsening mentation with narcotics. 3. Acute Renal Failure:  Currently on CVVHD & tolerating well. Nephrology following.Trending electrolytes & renal function twice daily. 4. Septic Shock: Escalating vasopressor requirement. Day #9 of 10  of empiric Vancomycin & Zosyn. 5. Oral thrush:  Starting nystatin swish and swallow 4 times a day. 6. Malignant Ascites:  Unable to perform paracentesis either by me or IR. 7. Pancytopenia:  Slowly worsening. No signs of active bleeding. Likely secondary to chemotherapy. Trending cell counts daily. Neutropenic precautions. 8. Possible Adrenal Insufficiency:  Discontinued stress dose steroids after 5 days on 2/13. 9. H/O DM:  BG controlled. Low dose SSI algorithm & accu-checks to qAC & HS. 10. Elevated Troponin I:  Likely demand ischemia in setting of shock. No wall motion abnormalities on TTE to suggest MI. 11. Acute Hypoxic Respiratory Failure:  Resolved. Extubated 2/12. Continuing pulmonary toilette with IS as mental status allows. 12. Hyponatremia:  Resolved. 13. Prophylaxis:  Continue SCDs & Protonix PO daily. 14. Diet:  Continue diabetic diet. Continuing Glucerna. Nutrition consulted for recommendations. 15. Disposition:  Family and patient want to continue current care for now. Efforts being made to get family to [place]. May transition to full palliative care this weekend. Palliative medicine following & meeting scheduled for today.  Will write letter on behalf of her family member for [place] to enter [place] from [place] today 2/18  Family Updates:  Status and plans discussed with her niece at bedside 2/18.   Baltazar Apo, MD, PhD 01/20/2016, 8:52 AM [facility] [phone] or if no answer [phone]

## 2016-01-20 NOTE — Progress Notes (Signed)
CRITICAL VALUE ALERT  Critical value received:  hgb 6.9  Date of notification:  01/20/2016  Time of notification:  0713  Critical value read back:Yes.    Nurse who received alert:  Javier Glazier, RN  MD notified (1st page):  [place]  Time of first page:  [phone]  MD notified (2nd page):  Time of second page:  Responding MD:  [place]rum  Time MD responded:  [phone]  No further orders at this time

## 2016-01-20 NOTE — Progress Notes (Signed)
UR Completed. Hoa Deriso, RN, BSN.  [phone] 

## 2016-01-21 ENCOUNTER — Other Ambulatory Visit: Payer: Self-pay | Admitting: Oncology

## 2016-01-21 DIAGNOSIS — C541 Malignant neoplasm of endometrium: Secondary | ICD-10-CM

## 2016-01-21 LAB — RENAL FUNCTION PANEL
ALBUMIN: 1.6 g/dL — AB (ref 3.5–5.0)
ALBUMIN: 1.7 g/dL — AB (ref 3.5–5.0)
ANION GAP: 9 (ref 5–15)
ANION GAP: 9 (ref 5–15)
BUN: 12 mg/dL (ref 6–20)
BUN: 11 mg/dL (ref 6–20)
CALCIUM: 7.9 mg/dL — AB (ref 8.9–10.3)
CALCIUM: 7.8 mg/dL — AB (ref 8.9–10.3)
CO2: 27 mmol/L (ref 22–32)
CO2: 26 mmol/L (ref 22–32)
Chloride: 103 mmol/L (ref 101–111)
Chloride: 101 mmol/L (ref 101–111)
Creatinine, Ser: 0.99 mg/dL (ref 0.44–1.00)
Creatinine, Ser: 0.9 mg/dL (ref 0.44–1.00)
GFR, EST NON AFRICAN AMERICAN: 59 mL/min — AB (ref >60)
GLUCOSE: 197 mg/dL — AB (ref 65–99)
Glucose, Bld: 199 mg/dL — ABNORMAL HIGH (ref 65–99)
PHOSPHORUS: 1.8 mg/dL — AB (ref 2.5–4.6)
PHOSPHORUS: 3.1 mg/dL (ref 2.5–4.6)
Potassium: 4.4 mmol/L (ref 3.5–5.1)
Potassium: 4.1 mmol/L (ref 3.5–5.1)
SODIUM: 139 mmol/L (ref 135–145)
SODIUM: 136 mmol/L (ref 135–145)

## 2016-01-21 LAB — GLUCOSE, CAPILLARY
GLUCOSE-CAPILLARY: 186 mg/dL — AB (ref 65–99)
Glucose-Capillary: 202 mg/dL — ABNORMAL HIGH (ref 65–99)
Glucose-Capillary: 196 mg/dL — ABNORMAL HIGH (ref 65–99)
Glucose-Capillary: 170 mg/dL — ABNORMAL HIGH (ref 65–99)

## 2016-01-21 LAB — CBC WITH DIFFERENTIAL/PLATELET
BASOS PCT: 0 %
Basophils Absolute: 0 10*3/uL (ref 0.0–0.1)
EOS PCT: 0 %
Eosinophils Absolute: 0 10*3/uL (ref 0.0–0.7)
HCT: 19.8 % — ABNORMAL LOW (ref 36.0–46.0)
HEMOGLOBIN: 6.9 g/dL — AB (ref 12.0–15.0)
Lymphocytes Relative: 32 %
Lymphs Abs: 0.3 10*3/uL — ABNORMAL LOW (ref 0.7–4.0)
MCH: 31.2 pg (ref 26.0–34.0)
MCHC: 34.8 g/dL (ref 30.0–36.0)
MCV: 89.6 fL (ref 78.0–100.0)
MONOS PCT: 6 %
Monocytes Absolute: 0.1 10*3/uL (ref 0.1–1.0)
NEUTROS PCT: 62 %
Neutro Abs: 0.5 10*3/uL — ABNORMAL LOW (ref 1.7–7.7)
Platelets: 11 10*3/uL — CL (ref 150–400)
RBC: 2.21 MIL/uL — AB (ref 3.87–5.11)
RDW: 13.5 % (ref 11.5–15.5)
WBC: 0.9 10*3/uL — AB (ref 4.0–10.5)

## 2016-01-21 LAB — MAGNESIUM: MAGNESIUM: 2.4 mg/dL (ref 1.7–2.4)

## 2016-01-21 LAB — PREPARE RBC (CROSSMATCH)

## 2016-01-21 MED ORDER — SODIUM CHLORIDE 0.9 % IV SOLN
Freq: Once | INTRAVENOUS | Status: AC
  Administered 2016-01-21: 12:00:00 via INTRAVENOUS

## 2016-01-21 MED ORDER — SODIUM CHLORIDE 0.9 % IV SOLN
Freq: Once | INTRAVENOUS | Status: AC
  Administered 2016-01-21: 14:00:00 via INTRAVENOUS

## 2016-01-21 MED ORDER — SODIUM PHOSPHATE 3 MMOLE/ML IV SOLN
30.0000 mmol | Freq: Every day | INTRAVENOUS | Status: DC
  Administered 2016-01-21: 30 mmol via INTRAVENOUS
  Filled 2016-01-21 (×3): qty 10

## 2016-01-21 NOTE — Progress Notes (Signed)
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  Now with rapid recurrent disease since mid January with large volume malignant ascites, bulky involvement pelvis and abdominal caking.  She is followed by Dr. Marko Plume with oncology and is currently undergoing chemo.  Family reports that oncologist feels as though there is hope for improvement with chemotherapy. She received last dose of chemotherapy 2/8. She had 2u PRBC transfusion 2/9 for symptomatic anemia.  During transfusion, she complained of SOB with diaphoresis.  She was subsequently sent to ED for further evaluation.  In ED, she was hypertensive and remained SOB.  She was therefore placed on BiPAP. CXR showed significant pulmonary edema. Laboratory assessment was significant for renal failure with K 7.2, Na 119 (chronically low), and SCr 3.61. She is also hyperglycemic with Glucose 369. Lab eval also notable for WBC 20.2. CT abdomen from a few days prior consistent with progressive mass size with compression of L ureter and L hydronephrosis. She was unable to tolerate BiPAP in ED and required intubation.  PCCM asked to admit.  Extensive discussion with family by Dr. Titus Mould.  Given Mrs. [name]'s hx and current circumstances, decision made to limit code status to intubation and vasopressors only.  In the event of cardiac arrest, we would not put her through CPR or defibrillation.  Full medical management otherwise.  STUDIES:  TTE 1/31:  LV EF 65-70%. Normal wall motion. Grade 1 diastolic dysfunction. LA & RA normal in size. RV normal in size & function. PASP 21 mmHg. Normal aortic & mitral  valves. No pericardial effusion. Port CXR 2/11: No opacity. ETT in good position. R IJ CVL in good position.  TTE 2/11:  EF 65-70% w/ normal wall motion. Grade 1 diastolic dysfunction. RV normal in size & function. No effusion.  MICROBIOLOGY: Blood Ctx x2 2/9:  Negative  Urine Ctx 2/9:  Negative  Blood Ctx x2 2/3:  Negative  Urine Ctx 2/2:  Negative   ANTIBIOTICS: Vanc 02/09 > Zosyn 02/09 >  SIGNIFICANT EVENTS: 02/09 > admitted with acute hypoxic respiratory failure, multiple electrolyte derangements. 02/10 > started CVVHD 02/11 > ureteral stent placed 02/12 > extubated  LINES/TUBES: R IJ HD cath 02/09 > Foley 2/9 > R Chest Port 1/31 > OETT 7.5 02/09 - 2/12 OGT 2/9 - 2/12  SUBJECTIVE:  Awake and interacting,  No significant changes o/n CVVHD running  REVIEW OF SYSTEMS:   Sore throat improving norepi up slightly to 13 CVVHD running  VITAL SIGNS: BP 120/55 mmHg  Pulse 87  Temp(Src) 97.3 F (36.3 C) (Oral)  Resp 22  Ht 5\' 2"  (1.575 m)  Wt 66.7 kg (147 lb 0.8 oz)  BMI 26.89 kg/m2  SpO2 100%  HEMODYNAMICS:    VENTILATOR SETTINGS:    INTAKE / OUTPUT: I/O last 3 completed shifts: In: 3573.4 [P.O.:45; I.V.:1879.2; IV Piggyback:1649.2] Out: [id number] [Urine:10; [id number]  PHYSICAL EXAMINATION: General:  Awake, laying in bed. No acute distress.  Integument:  Warm & dry. No rash on exposed skin.  HEENT:  No scleral injection. Thrush present on tongue. Cardiovascular:  Regular rhythm. Anasarca worsening. Unable to appreciate JVD.  Pulmonary: Clear to auscultation bilaterally. Normal work of  breathing on room air. Abdomen: Protuberant. Firm. Hypoactive bowel sounds. Mildly uncomfortable w/ palpation. Neurological:  Awake.  Following commands.  LABS:  BMET  Recent Labs Lab 01/20/16 0600 01/20/16 1620 01/21/16 0500  NA 135 134* 139  K 4.9 4.0 4.4  CL 101 100* 103  CO2 27 25 27   BUN 12 11 12   CREATININE 0.94 0.93 0.99  GLUCOSE 177* 281* 197*     Electrolytes  Recent Labs Lab 01/19/16 0420  01/20/16 0600 01/20/16 1620 01/21/16 0500  CALCIUM  --   < > 7.3* 7.3* 7.9*  MG 2.4  --  2.3  --  2.4  PHOS  --   < > 1.9* 1.8* 1.8*  < > = values in this interval not displayed.  CBC  Recent Labs Lab 01/19/16 0420 01/20/16 0600 01/21/16 0500  WBC 0.4* 0.4* 0.9*  HGB 7.3* 6.9* 6.9*  HCT 21.9* 20.5* 19.8*  PLT 20* 40* 11*    Coag's No results for input(s): APTT, INR in the last 168 hours.  Sepsis Markers No results for input(s): LATICACIDVEN, PROCALCITON, O2SATVEN in the last 168 hours.  ABG No results for input(s): PHART, PCO2ART, PO2ART in the last 168 hours.  Liver Enzymes  Recent Labs Lab 01/20/16 0600 01/20/16 1620 01/21/16 0500  ALBUMIN 1.6* 1.5* 1.6*    Cardiac Enzymes No results for input(s): TROPONINI, PROBNP in the last 168 hours.  Glucose  Recent Labs Lab 01/19/16 1557 01/19/16 2151 01/20/16 0925 01/20/16 1230 01/20/16 1709 01/20/16 2121  GLUCAP 165* 201* 170* 190* 193* 182*    Imaging No results found.  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 Patient continuing on CVVHD with hypotension (on norepi). Suspect patient's pain with swallowing secondary to oral thrush. Prognosis dismal. Patient's pancytopenia continuing to worsen.  1. Recurrent Endometrial Cancer:  Oncology following. Known malignant ascites. Patient s/p fist dose of chemotherapy recently. Trending cell counts daily with CBC. Fentanyl IV prn. 2. Acute Encephalopathy:  Multifactorial. Fluctuates and likely complicated by delirium. Balancing comfort from abdominal pain versus worsening mentation with narcotics. 3. Acute Renal Failure:  Currently on CVVHD & tolerating well. Nephrology following.Trending electrolytes & renal function twice daily. 4. Septic Shock: Escalating vasopressor requirement. Day #10 of 10  of empiric Vancomycin &  Zosyn. 5. Oral thrush: Starting nystatin swish and swallow 4 times a day. 6. Malignant Ascites:  Unable to perform paracentesis either by Dr Ashok Cordia or IR. 7. Pancytopenia:  Slowly worsening. No signs of active bleeding. Likely secondary to chemotherapy. Trending cell counts daily. Neutropenic precautions. Would transfuse plt to keep > 10k even in absence bleeding 8. Possible Adrenal Insufficiency:  Discontinued stress dose steroids after 5 days on 2/13. 9. H/O DM:  BG controlled. Low dose SSI algorithm & accu-checks to qAC & HS. 10. Elevated Troponin I:  Likely demand ischemia in setting of shock. No wall motion abnormalities on TTE to suggest MI. 11. Acute Hypoxic Respiratory Failure:  Resolved. Extubated 2/12. Continuing pulmonary toilette with IS as mental status allows. 12. Hyponatremia:  Resolved. 13. Prophylaxis:  Continue SCDs & Protonix PO daily. 14. Diet:  Continue diabetic diet. Continuing Glucerna. Nutrition consulted for recommendations. 15. Disposition:  Family and patient want to continue current care for now. Efforts being made to get family to [place]. May transition to full palliative care soon? Palliative medicine following.  Wrote letter on behalf of her family member for [place] to enter [place] from [place] 2/18  Family  Updates:  Status and plans discussed with family at bedside 2/18 and 2/19   Baltazar Apo, MD, PhD 01/21/2016, 6:48 AM [facility] [phone] or if no answer [phone]

## 2016-01-21 NOTE — Progress Notes (Signed)
MEDICAL ONCOLOGY January 21, 2016, 9:20 AM  Hospital day 11 Antibiotics: vanc zosyn Diflucan, acyclovir Chemotherapy: first adriamycin 01-10-16 as salvage for recurrent endometrial carcinoma, day 12 cycle 1 today.  (adjuvant chemotherapy for IIIA high grade serous endometrial carcinoma completed 06-16-15) Granix day 8   EMR reviewed. Patient seen with 2 daughters, 5 other family at bedside  Subjective: Rouses to voice. Mouth and throat seem a little better, able to swallow frozen glucerna yesterday. Denies other pain. Some cough, suctioning whitish sputum which she cannot clear, no blood tinge now. Breathing otherwise not more difficult. No overt bleeding. Denies thirst. Denies abdominal pain.     ONCOLOGIC HISTORY Paient presented to Dr [name] 01-03-15 with vaginal spotting x 1 year. Pelvic US showed 33 mm endometrial stripe with ovaries normal, and endometrial biopsy 01-03-15 grade 3 endometrial adenocarcinoma. CT CAP 01-13-15 had negative chest, no adenopathy, mild fatty liver, markedly thickened endometrium and no evidence of metastatic disease outside of uterus. CA 125 preoperatively on 01-13-15 was 22.9. Surgery by Dr [name] 01-19-15 was robotic hysterectomy, BSO and resection of nodule at sigmoid serosa. At surgery there was milial tumor studding with 5-10 mm implants on bladder peritoneum and miliary disease on pelvic sidewalls, without upper abdominal disease evident, and omentum appeared normal. 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 Recommendation for 6 cycles of taxol carboplatin, then consideration of external beam RT + vaginal brachytherapy if CR. First carboplatin taxol was given 02-16-2015; she was neutropenic with ANC 0.7 by day 15 cycle 1. She was transfused 2 units PRBCs 5-19/5-20-16 for Hgb 7.8. Cycles 4 and 5 were both delayed due  to thrombocytopenia. She completed chemotherapy 06-16-15 and vaginal brachytherapy30 gray in 5 fractions as of 09/14/2015. She did well thru end of 11-2015, including at gyn oncology exam 11-20-15, then presented to ED 12-29-15 with ~ 2 weeks progressive abdominal distension and pain, with large volume malignant ascites and large recurrent tumor burden. Cytology [id number] from ascites malignant cells consistent with gyn adenocarcinoma. She was hospitalized thru 01-07-16, with paracentesis for 4 liters bloody fluid on 1-28, for 3.5 liters on 01-02-16 and 4 liters on 01-09-16. Chemo was held 01-08-16 due to concerning exam, with bilateral LE venous dopplers no evidence of DVT and CT confirming large ascites (not appreciated on [place] 01-06-16) and bulky tumor without apparent bleed. Patient and family were counseled on very aggressive and advanced recurrent malignancy, understood that any treatment would be in attempt to control disease but not potential cure. Discussion then about life support and advance directives, specifically that such measures would not improve the underlying malignancy; patient and family wanted to consider that information. She requested attempt at treatment, had first adriamycin on 01-10-16 She was receiving PRBCs on 01-11-16 when developed respiratory distress, intubated, and has been supported in ICU with continuous dialysis and pressors.   Objective: Vital signs in last 24 hours: Blood pressure 107/52, pulse 84, temperature 97.3 F (36.3 C), temperature source Oral, resp. rate 17, height 5\' 2"  (1.575 m), weight 147 lb 0.8 oz (66.7 kg), SpO2 100 %. Awake, fairly alert, cooperative. Speaks a few words softly. PERRL. Able to visualize only tongue and anterior mouth, with improved thrush and no ulcerations. Dialysis lines left neck and PAC sites ok without bleeding. Lungs diminished BS lower fields anteroirly, no wheezes or rales. Cough sounds as if secretions in throat. Heart RRR. Abdomen distended,  full, not quite tight, no obvious change from last few days, not tender to gentle exam. LE less swelling lower legs beneath SCDs, without petechiae. Feet warm.   Intake/Output from previous day: 02/18 0701 - 02/19 0700 In: 2177.3 [I.V.:1324.1; IV Piggyback:853.2] Out: 3621 [Urine:15] Intake/Output this shift: Total I/O In: 107.6 [I.V.:107.6] Out: 175 [Other:175]   Lab Results:  Recent Labs  01/20/16 0600 01/21/16 0500  WBC 0.4* 0.9*  HGB 6.9* 6.9*  HCT 20.5* 19.8*  PLT 40* 11*  neutrophils seen on smear, ANC 0.5 today  BMET  Recent Labs  01/20/16 1620 01/21/16 0500  NA 134* 139  K 4.0 4.4  CL 100* 103  CO2 25 27  GLUCOSE 281* 197*  BUN 11 12  CREATININE 0.93 0.99  CALCIUM 7.3* 7.9*    Studies/Results: No results found.   DISCUSSION:  Talked with daughters about transfusing 1 unit PRBCs and platelets, as Hgb still very low, high risk for significant bleeding including the bloody malignant ascites, BP still requiring pressors, desire to let her interact with family this weekend, as they have come from [place] and [place]. Family understands that transfusions now will not affect longer term situation. Problems at time of admission were multiple complications related to the underlying cancer rather than just to transfusions then, and overall are better managed now with ongoing CRRT.  Daughters request transfusion of 1 unit PRBCs and platelets.  Daughters also would like to speak with [place] genetics counselors, which I will try to arrange either at [facility] or at hospital on 2-20 if possible.    Daughters ask if each of the different supportive interventions will be considered separately if they decide to stop dialysis. I have told them that each intervention would be assessed given overall situation, and that comfort interventions would not be stopped. Note palliative care continues to follow and may want to address also  Assessment/Plan: 1.High grade endometrial carcinoma:  rapidly recurrent in past several weeks with large volume malignant ascites and extensive, bulky metastatic disease in abdomen and pelvis. 3 large volume paracenteses with bloody malignant ascites thru 01-09-16. Received first adriamycin on 01-10-16, in attempt to improve/ control the disease, however this is not situation of potential cure. Ascites has not reaccumulated as quickly as prior to first adriamycin. Counts still extremely low from chemo, profoundly neutropenic tho not febrile, hemoglobin <7, platelets down again today tho no overt bleeding.. Continue granix until [place] > 1.0-1.5, transfuse 1 unit PRBCs and 1 phoresis platelets today as above.  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  2.Acute hypoxic respiratory failure 01-11-16: breathing still mostly comfortable off vent and is not to be reintubated.. Some productive cough.  Large ascites was also restricting breathing prior to intubation. 3.acute renal failure: on continuous dialysis. Bilateral ureteral stents placed 01-13-16, no improvement in urine output, certainly third spacing significant amount. Electrolyte abnormalities improved with CRRT. Please DC foley if not needed due to profound neutropenia 4.Chemotherapy pancytopenia: counts remain extremely low tho WBC/ ANC slightly better today. Continue gCSF, neutropenic precautions. Certainly at extremely high risk infection in ICU setting and with mucositis.. Anemia related to chemo, bloody ascites, renal failure; agree with transfusing 1 unit PRBCs today.. Thrombocytopenia consistent with chemo, would transfuse platelets with 11k now as she is at high risk for bleeding with the malignant ascites and ICU interventions,  holding heparin.  5.Stomatitis,  esophagitis: consistent with adriamycin/ thrush/ possibly viral.  Stomatitis some better. Continue diflucan, acyclovir dosed for renal, other topicals for symptoms, continue IV fentanyl. 6.hypotension: still requiring pressors. CRRT still gradually removing fluid. 7.Diabetes covering with insulin 8.Severe protein calorie malnutrition. Patient and family motivated to continue glucerna 9. flu vaccine 01-08-16 10. PAC , dialysis lines 11..Limited code more appropriate than full code in situation of underlying advanced malignancy. If worsens, DNR and comfort care would also be appropriate given underlying situation. Appreciate palliative care team following.    Please page  If I can help between my rounds [phone], or med onc on call [place]

## 2016-01-21 NOTE — Progress Notes (Signed)
[facility] Progress Note   Subjective:  Tolerating  CRRT  No filter issues - last cartridge lasted 72 hours No heparin 4K dialysate/4K replacement fluids Have been able to get off about 12 kg so far since start of CRRT (see weight trending) Norepi is at 14 No evidence for any return of kidney function   Filed Vitals:   01/21/16 1155 01/21/16 1200 01/21/16 1300 01/21/16 1345  BP: 109/59 124/59 121/57 138/57  Pulse: 79  87 81  Temp: 97.6 F (36.4 C)   97.5 F (36.4 C)  TempSrc: Oral   Oral  Resp: 23 22 24 23   Height:      Weight:      SpO2: 100% 100% 100% 100%   Exam: BP 138/57 mmHg  Pulse 81  Temp(Src) 97.5 F (36.4 C) (Oral)  Resp 23  Ht 5\' 2"  (1.575 m)  Wt 66.7 kg (147 lb 0.8 oz)  BMI 26.89 kg/m2  SpO2 100%  More awake today Room air No distress No jvd Line left IJ for CRRT (2/9) Chest clear  Regular, HR 80's Abd mod-severe ascites, nontender -  tight - + abd wall edema 2-3+ bilat LE pitting edema to hips - edema quite a bit better - legs much softer No urine   Weight trending 01/21/16  66.7 kg (147 lb 0.8 oz)    01/20/16 0421 68.8 kg (151 lb 10.8 oz)  01/19/16 0421 72.3 kg (159 lb 6.3 oz)  01/18/16 0421 73 kg (160 lb 15 oz)  01/17/16 0500 76.1 kg (167 lb 12.3 oz)  01/16/16 0500 75.8 kg (167 lb 1.7 oz)  01/15/16 0429 78.3 kg (172 lb 9.9 oz)  01/14/16 0500 73.6 kg (162 lb 4.1 oz)  01/13/16 0400 76.1 kg (167 lb 12.3 oz)  01/12/16 0334 78.6 kg (173 lb 4.5 oz)  01/12/16 0000 78 kg     Inpatient medications: . sodium chloride   Intravenous Once  . acyclovir  5 mg/kg Intravenous Q24H  . antiseptic oral rinse  7 mL Mouth Rinse BID  . feeding supplement (GLUCERNA SHAKE)  237 mL Oral TID BM  . fluconazole (DIFLUCAN) IV  400 mg Intravenous Q24H  . insulin aspart  0-5 Units Subcutaneous QHS  . insulin aspart  0-9 Units Subcutaneous TID WC  . latanoprost  1 drop Both Eyes QHS  . nystatin  5 mL Oral QID  . pantoprazole  40 mg Oral Daily   . piperacillin-tazobactam  3.375 g Intravenous 4 times per day  . sodium chloride flush  10-40 mL Intracatheter Q12H  . sodium phosphate  Dextrose 5% IVPB  30 mmol Intravenous Q1200  . sucralfate  1 g Oral 4 times per day  . Tbo-filgastrim (GRANIX) SQ  300 mcg Subcutaneous Daily  . timolol  1 drop Both Eyes BID  . vancomycin  1,000 mg Intravenous Q24H  Infusions: . norepinephrine (LEVOPHED) Adult infusion 14 mcg/min (01/21/16 1300)  . dialysis replacement fluid (prismasate) 300 mL/hr at 01/21/16 0432  . dialysis replacement fluid (prismasate) 500 mL/hr at 01/21/16 0057  . dialysate (PRISMASATE) 1,500 mL/hr at 01/21/16 1256   Prn medications sodium chloride, fentaNYL (SUBLIMAZE) injection, heparin, heparin, lidocaine, LORazepam, phenol, sodium chloride flush    Recent Labs Lab 01/20/16 0600 01/20/16 1620 01/21/16 0500  NA 135 134* 139  K 4.9 4.0 4.4  CL 101 100* 103  CO2 27 25 27   GLUCOSE 177* 281* 197*  BUN 12 11 12   CREATININE 0.94 0.93 0.99  CALCIUM 7.3* 7.3*  7.9*  PHOS 1.9* 1.8* 1.8*    Recent Labs Lab 01/20/16 0600 01/20/16 1620 01/21/16 0500  ALBUMIN 1.6* 1.5* 1.6*    Recent Labs Lab 01/19/16 0420 01/20/16 0600 01/21/16 0500  WBC 0.4* 0.4* 0.9*  NEUTROABS 0.1* 0.1* 0.5*  HGB 7.3* 6.9* 6.9*  HCT 21.9* 20.5* 19.8*  MCV 89.4 91.1 89.6  PLT 20* 40* 11*    CXR 2/11 > normal cxr CT chest 2/9 > perihilar edema, bibasilar consolidation L> R ECHO 1/31 > normal LV/ RV     Assessment: 1. AKI  (contrast/ hypotension/ L hydro/ large vol paracentesis x 2) - anuric.  CRRT D#10. Tolerating well. Weight down about 12kg still with marked anasarca of LE's and malignant ascites; clear CXR.  Exam has improved some despite still extensive 3rd spaced fluid. Bilateral ureteral stents in place (2/11).  Not a candidate for long-term dialysis given severe comorbidities, family and patient are all aware (our partner Dr. Elmarie Shiley has also spoken with family member about  non-candidacy for long term dialysis).  No evidence at this time for any return of renal function.    Continuing to try to pull some fluid as she will tolerate - goal range of 0-75 neg/hour. Making headway with fluid removal.  My goal would be to try and get a little more volume off even if that means an increase in the NE because she will be more comfortable. K stable and standing daily dose of sodium phosphate has been increased to 20 mmoles as of today.   2. Recurrent metastatic endometrial cancer- recurrent, s/p adriamycin 2/8 as salvage for recurrent disease, not a situation of potential cure per oncology. Dr. Marko Plume recommended peritoneal catheter to help keep ascites decompressed. Last paracentesis was 2/11 - 4 liters. [place] 2/14 yielded no safe sites for paracentesis 3. Septic shock- remains on vanco, zosyn, and vasopressor 4. Pancytopenia - continued drop in WBC, plts. Hb down to 6.9. To get PRBC's and platelets today. 5. Acute hypoxic resp failure - resolved 6. DM on oral agent 7. Malnutrition/ hypoalbuminemia 8. Anemia 9. Disposition - limited code. Additional family coming in over the weekend. No change in level of care at this time.  Jamal Maes, MD [facility] [phone] Pager 01/21/2016, 2:02 PM

## 2016-01-22 ENCOUNTER — Other Ambulatory Visit: Payer: BLUE CROSS/BLUE SHIELD

## 2016-01-22 ENCOUNTER — Ambulatory Visit: Payer: BLUE CROSS/BLUE SHIELD | Admitting: Oncology

## 2016-01-22 DIAGNOSIS — R579 Shock, unspecified: Secondary | ICD-10-CM | POA: Diagnosis present

## 2016-01-22 DIAGNOSIS — C569 Malignant neoplasm of unspecified ovary: Secondary | ICD-10-CM

## 2016-01-22 LAB — GLUCOSE, CAPILLARY
GLUCOSE-CAPILLARY: 173 mg/dL — AB (ref 65–99)
GLUCOSE-CAPILLARY: 183 mg/dL — AB (ref 65–99)
GLUCOSE-CAPILLARY: 206 mg/dL — AB (ref 65–99)
Glucose-Capillary: 151 mg/dL — ABNORMAL HIGH (ref 65–99)

## 2016-01-22 LAB — PREPARE PLATELET PHERESIS: Unit division: 0

## 2016-01-22 LAB — RENAL FUNCTION PANEL
Albumin: 1.9 g/dL — ABNORMAL LOW (ref 3.5–5.0)
Anion gap: 9 (ref 5–15)
BUN: 11 mg/dL (ref 6–20)
CHLORIDE: 101 mmol/L (ref 101–111)
CO2: 26 mmol/L (ref 22–32)
CREATININE: 1.1 mg/dL — AB (ref 0.44–1.00)
Calcium: 7.9 mg/dL — ABNORMAL LOW (ref 8.9–10.3)
GFR calc Af Amer: 60 mL/min — ABNORMAL LOW (ref >60)
GFR, EST NON AFRICAN AMERICAN: 52 mL/min — AB (ref >60)
GLUCOSE: 200 mg/dL — AB (ref 65–99)
POTASSIUM: 4.2 mmol/L (ref 3.5–5.1)
Phosphorus: 2.6 mg/dL (ref 2.5–4.6)
Sodium: 136 mmol/L (ref 135–145)

## 2016-01-22 LAB — CBC WITH DIFFERENTIAL/PLATELET
BASOS ABS: 0 10*3/uL (ref 0.0–0.1)
Basophils Relative: 0 %
EOS PCT: 0 %
Eosinophils Absolute: 0 10*3/uL (ref 0.0–0.7)
HEMATOCRIT: 27.9 % — AB (ref 36.0–46.0)
HEMOGLOBIN: 9.2 g/dL — AB (ref 12.0–15.0)
LYMPHS ABS: 0.4 10*3/uL — AB (ref 0.7–4.0)
LYMPHS PCT: 13 %
MCH: 29.2 pg (ref 26.0–34.0)
MCHC: 33 g/dL (ref 30.0–36.0)
MCV: 88.6 fL (ref 78.0–100.0)
MONOS PCT: 5 %
Monocytes Absolute: 0.2 10*3/uL (ref 0.1–1.0)
NEUTROS ABS: 2.5 10*3/uL (ref 1.7–7.7)
Neutrophils Relative %: 82 %
PLATELETS: 36 10*3/uL — AB (ref 150–400)
RBC: 3.15 MIL/uL — ABNORMAL LOW (ref 3.87–5.11)
RDW: 15.1 % (ref 11.5–15.5)
WBC: 3.1 10*3/uL — ABNORMAL LOW (ref 4.0–10.5)

## 2016-01-22 LAB — TYPE AND SCREEN
ABO/RH(D): B POS
Antibody Screen: NEGATIVE
UNIT DIVISION: 0

## 2016-01-22 LAB — MAGNESIUM: MAGNESIUM: 2.5 mg/dL — AB (ref 1.7–2.4)

## 2016-01-22 MED ORDER — HEPARIN SODIUM (PORCINE) 1000 UNIT/ML IJ SOLN
3000.0000 [IU] | INTRAMUSCULAR | Status: DC | PRN
  Administered 2016-01-25: 3000 [IU] via INTRAVENOUS
  Filled 2016-01-22 (×2): qty 3

## 2016-01-22 NOTE — Progress Notes (Signed)
   01/22/16 1000  Clinical Encounter Type  Visited With Patient and family together  Visit Type Follow-up;Psychological support;Spiritual support;Critical Care  Referral From Chaplain;Nurse  Consult/Referral To Chaplain  Spiritual Encounters  Spiritual Needs Emotional;Other (Comment) Academic librarian)  Stress Factors  Patient Stress Factors None identified  Family Stress Factors None identified   I followed up with the patient per referral by the nighttime Chaplain who was referred by the nurse. I visited with the patient when she was on the 3rd floor and received the same response, which was that they were fine.  The patient gives yes and no answers, and states that she is fine. Her family said that they couldn't be happier with the care that she has received.  Please let us know if the patient needs follow-up care. At this time, I do not think that she requires daily visits.     [place] M.Div.

## 2016-01-22 NOTE — Progress Notes (Signed)
Inpatient Diabetes Program Recommendations  AACE/ADA: New Consensus Statement on Inpatient Glycemic Control (2015)  Target Ranges:  Prepandial:   less than 140 mg/dL      Peak postprandial:   less than 180 mg/dL (1-2 hours)      Critically ill patients:  140 - 180 mg/dL   Review of Glycemic Control   Results for Brittany Schwartz, Brittany Schwartz (MRN [id number]) as of 01/22/2016 10:55  Ref. Range 01/21/2016 08:03 01/21/2016 12:04 01/21/2016 16:48 01/21/2016 21:20 01/22/2016 07:39  Glucose-Capillary Latest Ref Range: 65-99 mg/dL 186 (H) 202 (H) 196 (H) 170 (H) 173 (H)  Results for Brittany Schwartz, Brittany Schwartz (MRN [id number]) as of 01/22/2016 10:55  Ref. Range 01/20/2016 16:20 01/21/2016 05:00 01/21/2016 15:58 01/22/2016 04:23  Glucose Latest Ref Range: 65-99 mg/dL 281 (H) 197 (H) 199 (H) 200 (H)   Hyperglycemia in the presence of steroids.  Inpatient Diabetes Program Recommendations:    May benefit from addition of Levemir 6 units QHS Increase Novolog to moderate tidwc and hs  Will continue to follow. Thank you. Lorenda Peck, RD, LDN, CDE Inpatient Diabetes Coordinator [phone]

## 2016-01-22 NOTE — Progress Notes (Signed)
Pharmacy Antibiotic Note  Brittany Schwartz is a 65 y.o. female admitted on 01/11/2016 with sepsis/shock.  Pharmacy has been consulted for Vancomycin and Zosyn dosing. She remains on CRRT. Mucositis/stomatitis/esophagitis likely from adriamycin, possible thrush or HSV so started on acyclovir per pharmacy and fluconazole as well as carafate, visc lidocaine, and nystatin.  Plan:  Mucositis reportedly improved.   CRRT was stopped today. We will follow the SCr trend and adjust accordingly.  Cont acyclovir 5mg /kg IV q24h x 7d(ends on 2/24).   Also on fluconazole 400mg  IV q24h x7d(ends on 2/24).  Height: 5\' 2"  (157.5 cm) Weight: 142 lb 13.7 oz (64.8 kg) IBW/kg (Calculated) : 50.1  Temp (24hrs), Avg:97.6 F (36.4 C), Min:97.4 F (36.3 C), Max:97.8 F (36.6 C)   Recent Labs Lab 01/18/16 0421 01/18/16 0835  01/19/16 0420  01/20/16 0600 01/20/16 1620 01/21/16 0500 01/21/16 1558 01/22/16 0423  WBC 2.3*  --   --  0.4*  --  0.4*  --  0.9*  --  3.1*  CREATININE 0.93  --   < >  --   < > 0.94 0.93 0.99 0.90 1.10*  VANCOTROUGH  --  17  --   --   --   --   --   --   --   --   < > = values in this interval not displayed.  Estimated Creatinine Clearance: 45.1 mL/min (by C-G formula based on Cr of 1.1).    No Known Allergies  Antimicrobials this admission: 2/9 >> Vanc >> 2/20 2/9 >> Zosyn >> 2/20 2/17 >> Acyclovir >> (2/24) 2/17 >> Fluconazole >> (2/24)  Levels/dose changes this admission: 2/10: with start of CRRT, changed Zosyn to 3.375g IV q6h and started Vanc dosing at 1g q24h. 2/16: VT = 76mcg/ml on Vanc 1g q24h(prior to 6th dose), cont same.  Microbiology results: 2/10 MRSA: neg 2/9 urine: 8K insignificant growth  2/9 blood: NGF  Thank you for allowing pharmacy to be a part of this patient's care.  Romeo Rabon, PharmD, pager [phone]. 01/22/2016,3:04 PM.

## 2016-01-22 NOTE — Progress Notes (Signed)
Daily Progress Note   Patient Name: Brittany Schwartz       Date: 01/22/2016 DOB: 1951/02/09  Age: 65 y.o. MRN#: [id number] Attending Physician: [name], MD Primary Care Physician: [name], MD Admit Date: 01/11/2016  Reason for Consultation/Follow-up: Establishing goals of care  Subjective:  weak appearing female, sleepy at time of encounter.  Denied complaints  Family meeting: I spoke with the patient, her husband, and 2 daughters.  We talked about her clinical course to this point in time including her requirement for CRRT.  We discussed plan to d/c CRRT and follow her clinical course over the next several days.  They report being comfortable with this plan and deny having other questions at this time.  Family is agreeable to f/u meeting in 2-3 days to discuss clinical course during trial off CRRT.  - Continue with current level of care, but no escalation in care if her condition were to decompensate.  Code status of partial code with plan for continued vasopressor and CRRT, but no CPR/intubation/Bipap. - Will plan for palliative f/u with Dr. Rowe Pavy in 2-3 days to determine how she is doing off of CRRT.     Length of Stay: 11 days  Current Medications: Scheduled Meds:  . acyclovir  5 mg/kg Intravenous Q24H  . antiseptic oral rinse  7 mL Mouth Rinse BID  . feeding supplement (GLUCERNA SHAKE)  237 mL Oral TID BM  . fluconazole (DIFLUCAN) IV  400 mg Intravenous Q24H  . insulin aspart  0-5 Units Subcutaneous QHS  . insulin aspart  0-9 Units Subcutaneous TID WC  . latanoprost  1 drop Both Eyes QHS  . nystatin  5 mL Oral QID  . pantoprazole  40 mg Oral Daily  . sodium chloride flush  10-40 mL Intracatheter Q12H  . sucralfate  1 g Oral 4 times per day  . timolol  1 drop Both  Eyes BID    Continuous Infusions: . norepinephrine (LEVOPHED) Adult infusion 3 mcg/min (01/22/16 1408)    PRN Meds: sodium chloride, fentaNYL (SUBLIMAZE) injection, heparin, lidocaine, LORazepam, phenol, sodium chloride flush  Physical Exam: Physical Exam             Weak lady resting in bed Confused today, does not verbalize much Shallow breathing S1 S2 Abdomen distended Trace edema No coolness no  mottling  Vital Signs: BP 123/52 mmHg  Pulse 88  Temp(Src) 97.4 F (36.3 C) (Oral)  Resp 26  Ht 5\' 2"  (1.575 m)  Wt 64.8 kg (142 lb 13.7 oz)  BMI 26.12 kg/m2  SpO2 100% SpO2: SpO2: 100 % O2 Device: O2 Device: Not Delivered O2 Flow Rate: O2 Flow Rate (L/min): 2 L/min  Intake/output summary:   Intake/Output Summary (Last 24 hours) at 01/22/16 1507 Last data filed at 01/22/16 1400  Gross per 24 hour  Intake 1851.51 ml  Output   3277 ml  Net -1425.49 ml   LBM: Last BM Date: 01/18/16 Baseline Weight: Weight: 78 kg (171 lb 15.3 oz) Most recent weight: Weight: 64.8 kg (142 lb 13.7 oz)       Palliative Assessment/Data: Flowsheet Rows        Most Recent Value   Intake Tab    Referral Department  Hospitalist   Unit at Time of Referral  ICU   Palliative Care Primary Diagnosis  Cancer   Date Notified  01/14/16   Palliative Care Type  New Palliative care   Reason for referral  Clarify Goals of Care   Date of Admission  01/11/16   Date first seen by Palliative Care  01/15/16   # of days Palliative referral response time  1 Day(s)   # of days IP prior to Palliative referral  3   Clinical Assessment    Palliative Performance Scale Score  20%   Pain Max last 24 hours  4   Pain Min Last 24 hours  3   Dyspnea Max Last 24 Hours  4   Dyspnea Min Last 24 hours  3   Psychosocial & Spiritual Assessment    Palliative Care Outcomes    Patient/Family meeting held?  Yes   Who was at the meeting?  daughter, patient, patient's sister, patient's son in law.    Palliative Care  Outcomes  Clarified goals of care   Palliative Care follow-up planned  Yes, Facility      Additional Data Reviewed: CBC    Component Value Date/Time   WBC 3.1* 01/22/2016 0423   WBC 15.2* 01/10/2016 1323   RBC 3.15* 01/22/2016 0423   RBC 2.99* 01/10/2016 1323   HGB 9.2* 01/22/2016 0423   HGB 8.6* 01/10/2016 1323   HCT 27.9* 01/22/2016 0423   HCT 26.8* 01/10/2016 1323   PLT 36* 01/22/2016 0423   PLT 245 01/10/2016 1323   MCV 88.6 01/22/2016 0423   MCV 89.6 01/10/2016 1323   MCH 29.2 01/22/2016 0423   MCH 28.7 01/10/2016 1323   MCHC 33.0 01/22/2016 0423   MCHC 32.1 01/10/2016 1323   RDW 15.1 01/22/2016 0423   RDW 13.5 01/10/2016 1323   LYMPHSABS 0.4* 01/22/2016 0423   LYMPHSABS 0.7* 01/10/2016 1323   MONOABS 0.2 01/22/2016 0423   MONOABS 1.3* 01/10/2016 1323   EOSABS 0.0 01/22/2016 0423   EOSABS 0.0 01/10/2016 1323   BASOSABS 0.0 01/22/2016 0423   BASOSABS 0.0 01/10/2016 1323    CMP     Component Value Date/Time   NA 136 01/22/2016 0423   NA 122 Repeated and Verified* 01/08/2016 1132   K 4.2 01/22/2016 0423   K 4.7 01/08/2016 1132   CL 101 01/22/2016 0423   CO2 26 01/22/2016 0423   CO2 16* 01/08/2016 1132   GLUCOSE 200* 01/22/2016 0423   GLUCOSE 260* 01/08/2016 1132   BUN 11 01/22/2016 0423   BUN 30.0* 01/08/2016 1132   CREATININE 1.10* 01/22/2016  0423   CREATININE 1.6* 01/08/2016 1132   CALCIUM 7.9* 01/22/2016 0423   CALCIUM 8.3* 01/08/2016 1132   PROT 5.7* 01/11/2016 1652   PROT 5.1* 01/08/2016 1132   ALBUMIN 1.9* 01/22/2016 0423   ALBUMIN 1.7* 01/08/2016 1132   AST 25 01/11/2016 1652   AST 16 01/08/2016 1132   ALT 14 01/11/2016 1652   ALT 11 01/08/2016 1132   ALKPHOS 93 01/11/2016 1652   ALKPHOS 73 01/08/2016 1132   BILITOT 0.9 01/11/2016 1652   BILITOT 0.71 01/08/2016 1132   GFRNONAA 52* 01/22/2016 0423   GFRAA 60* 01/22/2016 0423       Problem List:  Patient Active Problem List   Diagnosis Date Noted  . Ovarian cancer ([place])   . Shock  circulatory ([place])   . Oral thrush   . Goals of care, counseling/discussion   . Acute respiratory failure with hypoxemia ([place])   . Delirium   . Acute renal failure ([place])   . Septic shock ([place])   . Encounter for palliative care   . Hyperkalemia 01/11/2016  . Dyspnea   . Encounter for intubation   . SOB (shortness of breath)   . Acute respiratory failure with hypoxia ([place])   . Malignant neoplasm of uterus ([place])   . Endometrial cancer, FIGO stage IVB ([place]) 01/09/2016  . Protein-calorie malnutrition, severe ([place]) 01/09/2016  . Hypoalbuminemia 01/09/2016  . AKI (acute kidney injury) ([place]) 01/04/2016  . Elevated lactic acid level 01/04/2016  . Malignant ascites   . Non-insulin dependent type 2 diabetes mellitus ([place])   . Metastatic cancer ([place]) 12/29/2015  . Ascites 12/29/2015  . Hydronephrosis, left 12/29/2015  . Abdominal distension 12/29/2015  . Antineoplastic chemotherapy induced anemia 08/15/2015  . Esophageal reflux 05/06/2015  . 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 Anemia associated with chemotherapy 04/14/2015  . Chemotherapy induced neutropenia ([place]) 03/05/2015  . Absolute anemia 02/24/2015  . Type 2 diabetes mellitus without complication ([place]) 0000000  . Essential hypertension 02/24/2015  . Glaucoma 02/12/2015  . Anemia 02/12/2015  . Hypochloremia 02/12/2015  . Hyponatremia 02/12/2015  . Dysuria 02/03/2015  . Uterine cancer ([place]) 01/19/2015  . Endometrial cancer ([place]) 01/09/2015  . Diabetes mellitus ([place]) 01/09/2015  . Hypertension 01/09/2015  . Hypercholesterolemia 01/09/2015     Palliative Care Assessment & Plan    1.Code Status:  Limited code    Code Status Orders        Start     Ordered   01/11/16 2106  Limited resuscitation (code)   Continuous    Question Answer Comment  In the event of cardiac or respiratory ARREST: Initiate Code Blue, Call Rapid Response No   In the event of cardiac or respiratory ARREST: Perform  CPR No   In the event of cardiac or respiratory ARREST: Perform Intubation/Mechanical Ventilation Yes   In the event of cardiac or respiratory ARREST: Use NIPPV/BiPAp only if indicated Yes   In the event of cardiac or respiratory ARREST: Administer ACLS medications if indicated Yes   In the event of cardiac or respiratory ARREST: Perform Defibrillation or Cardioversion if indicated No      01/11/16 2107    Code Status History    Date Active Date Inactive Code Status Order ID Comments User Context   01/11/2016  8:26 PM 01/11/2016  9:07 PM Partial Code [id number]  Corey Harold, NP ED   01/05/2016  1:38 AM 01/07/2016  7:04 PM Full Code [id number]  Edwin Dada, MD Inpatient   12/29/2015  4:27 PM 01/04/2016  4:59 PM Full Code [id number]  Domenic Polite, MD ED   01/19/2015  8:22 PM 01/20/2015  4:39 PM Full Code [id number]  Lahoma Crocker, MD Inpatient       2. Goals of Care/Additional Recommendations: As above  Desire for further Chaplaincy support:no  Psycho-social Needs: Caregiving  Support/Resources  3. Symptom Management:      1. As above- fentanyl iv prn   4. Palliative Prophylaxis:   Delirium Protocol  5. Prognosis: guarded regardless, however, could be as short as hours-days if full scope of comfort measures pursued at some point in the future.   6. Discharge Planning:  Pending clinical course following trial off of CRRT.   Care plan was discussed with patient, 2 daughters, husband, cousin.   Thank you for allowing the Palliative Medicine Team to assist in the care of this patient.   Time In:  1315 Time Out: 1350 Total Time 35 Prolonged Time Billed  yes       [id number] Micheline Rough, MD  01/22/2016, 3:07 PM  Please contact Palliative Medicine Team phone at [phone] for questions and concerns.

## 2016-01-22 NOTE — Progress Notes (Signed)
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  Now with rapid recurrent disease since mid January with large volume malignant ascites, bulky involvement pelvis and abdominal caking.  She is followed by Dr. Marko Plume with oncology and is currently undergoing chemo.  Family reports that oncologist feels as though there is hope for improvement with chemotherapy. She received last dose of chemotherapy 2/8. She had 2u PRBC transfusion 2/9 for symptomatic anemia.  During transfusion, she complained of SOB with diaphoresis.  She was subsequently sent to ED for further evaluation.  In ED, she was hypertensive and remained SOB.  She was therefore placed on BiPAP. CXR showed significant pulmonary edema. Laboratory assessment was significant for renal failure with K 7.2, Na 119 (chronically low), and SCr 3.61. She is also hyperglycemic with Glucose 369. Lab eval also notable for WBC 20.2. CT abdomen from a few days prior consistent with progressive mass size with compression of L ureter and L hydronephrosis. She was unable to tolerate BiPAP in ED and required intubation.  PCCM asked to admit.  Extensive discussion with family by Dr. Titus Mould.  Given Mrs. Angst's hx and current circumstances, decision made to limit code status to intubation and vasopressors only.  In the event of cardiac arrest, we would not put her through CPR or defibrillation.  Full medical management otherwise.  STUDIES:  TTE 1/31:  LV EF 65-70%. Normal wall motion. Grade 1 diastolic dysfunction. LA & RA normal in size. RV normal in size & function. PASP 21 mmHg. Normal aortic & mitral  valves. No pericardial effusion. Port CXR 2/11: No opacity. ETT in good position. R IJ CVL in good position.  TTE 2/11:  EF 65-70% w/ normal wall motion. Grade 1 diastolic dysfunction. RV normal in size & function. No effusion.  MICROBIOLOGY: Blood Ctx x2 2/9:  Negative  Urine Ctx 2/9:  Negative  Blood Ctx x2 2/3:  Negative  Urine Ctx 2/2:  Negative   ANTIBIOTICS: Vanc 02/09 > Zosyn 02/09 >  SIGNIFICANT EVENTS: 02/09 > admitted with acute hypoxic respiratory failure, multiple electrolyte derangements. 02/10 > started CVVHD 02/11 > ureteral stent placed 02/12 > extubated 2/20: stopped CRRT  LINES/TUBES: R IJ HD cath 02/09 > Foley 2/9 > R Chest Port 1/31 > OETT 7.5 02/09 - 2/12 OGT 2/9 - 2/12  SUBJECTIVE:  Awake and interacting,  No significant changes o/n CVVHD running  REVIEW OF SYSTEMS:   Sore throat improving norepi up slightly to 13 CVVHD running  VITAL SIGNS: BP 128/48 mmHg  Pulse 84  Temp(Src) 97.4 F (36.3 C) (Oral)  Resp 17  Ht 5\' 2"  (1.575 m)  Wt 142 lb 13.7 oz (64.8 kg)  BMI 26.12 kg/m2  SpO2 98%  HEMODYNAMICS:    VENTILATOR SETTINGS:    INTAKE / OUTPUT: I/O last 3 completed shifts: In: 3567.5 [P.O.:100; I.V.:2024.8; Blood:527.5; IV Piggyback:915.2] Out: [id number] [Urine:15; Other:6425]  PHYSICAL EXAMINATION: General:  Awake, laying in bed. No acute distress.  Integument:  Warm & dry. No rash on exposed skin.  HEENT:  No scleral injection. Thrush present on tongue. Cardiovascular:  Regular rhythm. Anasarca worsening. Unable to appreciate JVD.  Pulmonary: Clear to auscultation  bilaterally. Normal work of breathing on room air. Abdomen: Protuberant. Firm. Hypoactive bowel sounds. Mildly uncomfortable w/ palpation. Neurological:  Awake.  Following commands.  LABS:  BMET  Recent Labs Lab 01/21/16 0500 01/21/16 1558 01/22/16 0423  NA 139 136 136  K 4.4 4.1 4.2  CL 103 101 101  CO2 27 26 26   BUN 12 11 11   CREATININE 0.99 0.90 1.10*   GLUCOSE 197* 199* 200*    Electrolytes  Recent Labs Lab 01/20/16 0600  01/21/16 0500 01/21/16 1558 01/22/16 0423  CALCIUM 7.3*  < > 7.9* 7.8* 7.9*  MG 2.3  --  2.4  --  2.5*  PHOS 1.9*  < > 1.8* 3.1 2.6  < > = values in this interval not displayed.  CBC  Recent Labs Lab 01/20/16 0600 01/21/16 0500 01/22/16 0423  WBC 0.4* 0.9* 3.1*  HGB 6.9* 6.9* 9.2*  HCT 20.5* 19.8* 27.9*  PLT 40* 11* 36*    Coag's No results for input(s): APTT, INR in the last 168 hours.  Sepsis Markers No results for input(s): LATICACIDVEN, PROCALCITON, O2SATVEN in the last 168 hours.  ABG No results for input(s): PHART, PCO2ART, PO2ART in the last 168 hours.  Liver Enzymes  Recent Labs Lab 01/21/16 0500 01/21/16 1558 01/22/16 0423  ALBUMIN 1.6* 1.7* 1.9*    Cardiac Enzymes No results for input(s): TROPONINI, PROBNP in the last 168 hours.  Glucose  Recent Labs Lab 01/20/16 2121 01/21/16 0803 01/21/16 1204 01/21/16 1648 01/21/16 2120 01/22/16 0739  GLUCAP 182* 186* 202* 196* 170* 173*    Imaging No results found.  ASSESSMENT / PLAN:    Unfortunate 65 year old female with Grade 3 serous/endometriod endometrial carcinoma with recurrence. Patient continuing on CVVHD with hypotension (on norepi). Suspect patient's pain with swallowing secondary to oral thrush. Prognosis dismal. Patient's pancytopenia continuing to worsen.  Recurrent Endometrial Cancer:  Oncology following. Known malignant ascites. Patient s/p fist dose of chemotherapy recently.  Plan trending cell counts daily with CBC. Fentanyl IV prn.  Acute Encephalopathy:  Multifactorial. Fluctuates and likely complicated by delirium.  Plan Balancing comfort from abdominal pain versus worsening mentation with narcotics.  Acute Renal Failure in setting of hypoperfusion and malignant obstructive uropathy. S/p bilateral ureteral stents 2/11.  Just stopped CRRT on 2/20. Wt is down 12 kg after 11 days.  Plan Stop CRRT to  see if renal fxn recovers  Septic Shock (NOS): Completed Day #10 of 10  of empiric Vancomycin & Zosyn. Still pressor dependent Plan Stop CRRT Allow for even or positive balance  Wean levo as able   Elevated Troponin I:  Likely demand ischemia in setting of shock. No wall motion abnormalities on TTE to suggest MI. Plan Tele  Possible Adrenal Insufficiency:  Discontinued stress dose steroids after 5 days on 2/13. Plan Check repeat cortisol today   Oral thrush Plan:  cont nystatin swish and swallow 4 times a day.  Cough: may be related to thrush but need to r/o aspiration Plan SLP eval  Malignant Ascites:  Unable to perform paracentesis either by Dr Ashok Cordia or IR. Plan Monitor will repeat US when symptom burden dictates  Pancytopenia:  Slowly worsening. No signs of active bleeding. Likely secondary to chemotherapy.  Plan Trending cell counts daily.  Neutropenic precautions.  Would transfuse plt to keep > 10k even in absence bleeding   H/O DM:  BG controlled.  Plan Low dose SSI algorithm & accu-checks to qAC & HS.  Acute Hypoxic Respiratory Failure:  Resolved. Extubated  2/12.   Plan Continuing pulmonary toilette with IS as mental  Protein calorie malnutrition Plan  Continue diabetic diet. Continuing Glucerna. Nutrition consulted for recommendations.  Physical deconditioning Plan OOB   Disposition:  Family and patient want to continue current care for now. Efforts being made to get family to [place].  Wrote letter on behalf of her family member for [place] to enter [place] from [place] 2/18. From a cancer stand-point the goal is to palliate and slow progression. The immediate hope is for renal recovery. Family understands this could be a barrier we may not overcome. For now we will focus on maintaining even volume status, stopping CRRT, mobility and safety w/ swallowing.   Family Updates:  Status and plans discussed with family at bedside 2/20   Erick Colace  ACNP-BC [place] Pager # [phone] OR # [phone] if no answer

## 2016-01-22 NOTE — Progress Notes (Signed)
[facility] Progress Note   Subjective:  Tolerating CRRT, D#10 Net neg 1.8L yest . Wt's down 64.8kg, levo gtt at 9 ug/min  Filed Vitals:   01/22/16 0600 01/22/16 0635 01/22/16 0700 01/22/16 0747  BP: 125/70 130/52 99/60   Pulse: 72 81 76   Temp:    97.4 F (36.3 C)  TempSrc:    Oral  Resp: 22 21 20    Height:      Weight:      SpO2: 100% 99% 100%    Exam: BP 99/60 mmHg  Pulse 76  Temp(Src) 97.4 F (36.3 C) (Oral)  Resp 20  Ht 5\' 2"  (1.575 m)  Wt 64.8 kg (142 lb 13.7 oz)  BMI 26.12 kg/m2  SpO2 100% Awake today Room air No distress, looks tired No jvd Line left IJ for CRRT (2/9) Chest clear  Regular, HR 80's Abd moderate ascites, nontender + bs 2+ bilat LE pitting edema Neuro gen weakness   Weight trending 01/21/16  66.7 kg (147 lb 0.8 oz)    01/20/16 0421 68.8 kg (151 lb 10.8 oz)  01/19/16 0421 72.3 kg (159 lb 6.3 oz)  01/18/16 0421 73 kg (160 lb 15 oz)  01/17/16 0500 76.1 kg (167 lb 12.3 oz)  01/16/16 0500 75.8 kg (167 lb 1.7 oz)  01/15/16 0429 78.3 kg (172 lb 9.9 oz)  01/14/16 0500 73.6 kg (162 lb 4.1 oz)  01/13/16 0400 76.1 kg (167 lb 12.3 oz)  01/12/16 0334 78.6 kg (173 lb 4.5 oz)  01/12/16 0000 78 kg     Inpatient medications: . acyclovir  5 mg/kg Intravenous Q24H  . antiseptic oral rinse  7 mL Mouth Rinse BID  . feeding supplement (GLUCERNA SHAKE)  237 mL Oral TID BM  . fluconazole (DIFLUCAN) IV  400 mg Intravenous Q24H  . insulin aspart  0-5 Units Subcutaneous QHS  . insulin aspart  0-9 Units Subcutaneous TID WC  . latanoprost  1 drop Both Eyes QHS  . nystatin  5 mL Oral QID  . pantoprazole  40 mg Oral Daily  . sodium chloride flush  10-40 mL Intracatheter Q12H  . sodium phosphate  Dextrose 5% IVPB  30 mmol Intravenous Q1200  . sucralfate  1 g Oral 4 times per day  . timolol  1 drop Both Eyes BID  Infusions: . norepinephrine (LEVOPHED) Adult infusion 9 mcg/min (01/22/16 0800)  . dialysis replacement fluid (prismasate) 300  mL/hr at 01/21/16 2212  . dialysis replacement fluid (prismasate) 500 mL/hr at 01/22/16 0806  . dialysate (PRISMASATE) 1,500 mL/hr at 01/22/16 0305   Prn medications sodium chloride, fentaNYL (SUBLIMAZE) injection, heparin, heparin, lidocaine, LORazepam, phenol, sodium chloride flush    Recent Labs Lab 01/21/16 0500 01/21/16 1558 01/22/16 0423  NA 139 136 136  K 4.4 4.1 4.2  CL 103 101 101  CO2 27 26 26   GLUCOSE 197* 199* 200*  BUN 12 11 11   CREATININE 0.99 0.90 1.10*  CALCIUM 7.9* 7.8* 7.9*  PHOS 1.8* 3.1 2.6    Recent Labs Lab 01/21/16 0500 01/21/16 1558 01/22/16 0423  ALBUMIN 1.6* 1.7* 1.9*    Recent Labs Lab 01/20/16 0600 01/21/16 0500 01/22/16 0423  WBC 0.4* 0.9* 3.1*  NEUTROABS 0.1* 0.5* 2.5  HGB 6.9* 6.9* 9.2*  HCT 20.5* 19.8* 27.9*  MCV 91.1 89.6 88.6  PLT 40* 11* 36*    CXR 2/11 > normal cxr CT chest 2/9 > perihilar edema, bibasilar consolidation L> R ECHO 1/31 > normal LV/ RV  Assessment/Plan:  1 AKI  (contrast/ hypotension/ L hydro/ large vol paracentesis x 2) - anuric.  CRRT D#11. Weight down about 12kg. Ascites and edema better but not gone. Not sure due to dialysis, chemo , or both. Bilateral ureteral stents in place (2/11).  No urine output. Need to see if renal function will recover, and unlikely to recover while on pressors and pulling fluid by CRRT.  Will stop CRRT and see how she does the next few days, see if she can recover kidney function, what her BP will be off of CRRT, will the ascites/ edema recur, etc..... Have d/w oncology also, no plan for CT right now, blood marker to be ordered w/ respect to following up possible response the adriamycin/ chemoRx given on 2/8 2 Recurrent metastatic endometrial cancer- recurrent, s/p adriamycin 2/8 as salvage for recurrent disease, not a situation of potential cure per oncology.  3 Septic shock- off of abx, on pressors 4 Pancytopenia - improving today 5 DM on oral agent 6 Malnutrition/  hypoalbuminemia 7 Anemia 8 Disposition - limited code.   Kelly Splinter MD [employer] pager [phone]    cell [phone] 01/22/2016, 9:16 AM

## 2016-01-22 NOTE — Evaluation (Signed)
Clinical/Bedside Swallow Evaluation Patient Details  Name: Brittany Schwartz MRN: [id number] Date of Birth: 04-30-1951  Today's Date: 01/22/2016 Time: SLP Start Time (ACUTE ONLY): [id number] SLP Stop Time (ACUTE ONLY): 1525 SLP Time Calculation (min) (ACUTE ONLY): 40 min  Past Medical History:  Past Medical History  Diagnosis Date  . Hypertension   . Glaucoma   . Diabetes mellitus without complication ([place])   . Palpitations   . Muscle cramps   . Postmenopausal vaginal bleeding   . Uterine cancer [facility])    Past Surgical History:  Past Surgical History  Procedure Laterality Date  . Robotic assisted total hysterectomy with bilateral salpingo oopherectomy Bilateral 01/19/2015    Procedure: XI ROBOTIC ASSISTED TOTAL HYSTERECTOMY WITH BILATERAL SALPINGO OOPHORECTOMY;  Surgeon: [name], MD;  Location: [facility];  Service: Gynecology;  Laterality: Bilateral;  . Cystoscopy w/ ureteral stent placement Bilateral 01/13/2016    Procedure: CYSTOSCOPY WITH RETROGRADE PYELOGRAM/BILATERAL URETERAL STENT PLACEMENT;  Surgeon: [name], MD;  Location: [facility];  Service: Urology;  Laterality: Bilateral;   HPI:  65 year old female with 3 serous/ endometrioid endometrial carcinoma IIIA at diagnosis, adjuvant chemo completed 06/16/15. Rapid recurrent disease since mid January with large volume malignant ascites, bulky involvement pelvis and abdominal caking. Dr. Marko Plume is pt's oncologist and pt was undergoing chemo. Family reports to MD that oncologist feels there is hope with chemotherapy.  She received last dose of chemotherapy 2/8 and had 2u PRBC transfusion 2/9 for symptomatic anemia. During transfusion, she complained of SOB with diaphoresis per Md notes. Pt required intubation OETT 2/9-2/12 and had renal failure requiring CVVHD 2/10-2/20.  RN reports pt with coughing with intake, ? secondary to aspiration or thrush related per CCS note? Per CCS note, pt with very slight mucositis localized to left soft  palate only.  DG abdomen did not indicate need for paracentesis at this time.     Assessment / Plan / Recommendation Clinical Impression  SLP saw pt with many family members present - two daughters present and provided much information regarding pt.  No significant oral candidiasis noted and only 2 small spots of lesion on upper hard palate.   CN exam unremarkable except pt's phonation which is weak and dysphonic.  No focal CN deficits.    Pt willing to accept small amount of sprite with jello and icecream.  She does demonstrate sensation of globus prior to intake but this worsens with po.    Multiple swallows across all consistencies tested noted with delayed throat clearing/cough initially with only a few boluses but worsening as intake continued.  Did not test solids due to pt's report of decreased tolerance.    SLP can not rule out aspiration however pt reports she does receive comfort from small amount of intake.  She is afebrile with lungs clear and therefore is likely tolerating small amount of po intake she is consuming.    Daughters reports pt was tolerating po well after extubation (on the 12th) and this issue started a few days ago. Daughters states pt was able to phonate well and swallow better a few days ago.  Pt also reports odynophagia which is likely impacting swallowing and ability to meet nutrition as well.   SLP hopes dysphagia is reversible and due to side effect of her chemo therapy tx as Doxorubicin can cause cough/hoarseness and mucositis.     Recommend continue diet as pt tolerates with strict aspiration precautions.   Using teach back, written compensations, advised family/pt to precautions/strategies to mitigate  dysphagia.  Advised family if pt coughing excessively or is uncomfortable with intake to stop consumption.  Will follow briefly.  Note palliative following pt.         Aspiration Risk  Moderate aspiration risk    Diet Recommendation Thin liquid;Regular   Liquid  Administration via: Spoon Medication Administration: Via alternative means (or crushed with liquids) Supervision: Full supervision/cueing for compensatory strategies Compensations: Slow rate;Small sips/bites;Multiple dry swallows after each bite/sip;Clear throat after each swallow;Hard cough after swallow    Other  Recommendations Oral Care Recommendations: Oral care BID   Follow up Recommendations    TBD   Frequency and Duration min 1 x/week  1 week       Prognosis Prognosis for Safe Diet Advancement: Guarded      Swallow Study   General Date of Onset: 01/22/16 HPI: 65 year old female with 3 serous/ endometrioid endometrial carcinoma IIIA at diagnosis, adjuvant chemo completed 06/16/15. Rapid recurrent disease since mid January with large volume malignant ascites, bulky involvement pelvis and abdominal caking. Dr. Marko Plume is pt's oncologist and pt was undergoing chemo. Family reports to MD that oncologist feels there is hope with chemotherapy.  She received last dose of chemotherapy 2/8 and had 2u PRBC transfusion 2/9 for symptomatic anemia. During transfusion, she complained of SOB with diaphoresis per Md notes. Pt required intubation OETT 2/9-2/12 and had renal failure requiring CVVHD 2/10-2/20.  RN reports pt with coughing with intake, ? secondary to aspiration or thrush related per CCS note? Per CCS note, pt with very slight mucositis localized to left soft palate only.  DG abdomen did not indicate need for paracentesis at this time.   Type of Study: Bedside Swallow Evaluation Previous Swallow Assessment: none Diet Prior to this Study: Regular;Thin liquids Temperature Spikes Noted: No Respiratory Status: Room air History of Recent Intubation: Yes Length of Intubations (days): 4 days Date extubated: 01/14/16 Behavior/Cognition: Alert;Cooperative;Pleasant mood Oral Cavity Assessment:  (appearance of minimal lesions upper left palate) Oral Care Completed by SLP: No Oral Cavity  - Dentition: Adequate natural dentition Vision: Functional for self-feeding Self-Feeding Abilities: Needs assist Patient Positioning: Upright in bed;Postural control adequate for testing Baseline Vocal Quality: Low vocal intensity;Breathy Volitional Cough: Weak Volitional Swallow: Unable to elicit    Oral/Motor/Sensory Function Overall Oral Motor/Sensory Function:  (no focal CN deficits, pt with generalized weakness)   Ice Chips Ice chips: Not tested   Thin Liquid Thin Liquid: Impaired Presentation: Spoon Pharyngeal  Phase Impairments: Multiple swallows;Throat Clearing - Delayed;Cough - Delayed    Nectar Thick Nectar Thick Liquid: Not tested   Honey Thick Honey Thick Liquid: Not tested   Puree Puree: Impaired Presentation: Spoon Oral Phase Impairments: Reduced lingual movement/coordination Pharyngeal Phase Impairments: Multiple swallows;Throat Clearing - Delayed Other Comments: icecream and jello- pt reported she dislikes applesauce   Solid   GO   Solid: Not tested Other Comments: pt reports more difficulties managing solids vs tsps amouns of thin        Janett Labella [place], [place] [facility] SLP [phone]

## 2016-01-22 NOTE — Progress Notes (Signed)
MEDICAL ONCOLOGY January 22, 2016, 8:05 AM  Hospital day 12 Antibiotics: vanc, zosyn Diflucan, acyclovir Chemotherapy:  first adriamycin 01-10-16 as salvage for recurrent endometrial carcinoma, day 13 cycle 1 today.  (adjuvant chemotherapy for IIIA high grade serous endometrial carcinoma completed 06-16-15) Granix day 8  EMR reviewed, spoke with daughter at bedside and RN Transfused 1 unit PRBCs and 1 phoresis platelets on 01-21-16.  Subjective: Throat still sore, with difficulty swallowing and difficulty coughing up secretions, tho stomatitis much better. Denies SOB, denies other pain including abdomen. Foley still in. No BM.    ONCOLOGIC HISTORY Paient presented to Dr [name] 01-03-15 with vaginal spotting x 1 year. Pelvic US showed 33 mm endometrial stripe with ovaries normal, and endometrial biopsy 01-03-15 grade 3 endometrial adenocarcinoma. CT CAP 01-13-15 had negative chest, no adenopathy, mild fatty liver, markedly thickened endometrium and no evidence of metastatic disease outside of uterus. CA 125 preoperatively on 01-13-15 was 22.9. Surgery by Dr [name] 01-19-15 was robotic hysterectomy, BSO and resection of nodule at sigmoid serosa. At surgery there was milial tumor studding with 5-10 mm implants on bladder peritoneum and miliary disease on pelvic sidewalls, without upper abdominal disease evident, and omentum appeared normal. 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 Recommendation for 6 cycles of taxol carboplatin, then consideration of external beam RT + vaginal brachytherapy if CR. First carboplatin taxol was given 02-16-2015; she was neutropenic with ANC 0.7 by day 15 cycle 1. She was transfused 2 units PRBCs 5-19/5-20-16 for Hgb 7.8. Cycles 4 and 5 were both delayed due to thrombocytopenia. She completed chemotherapy 06-16-15 and vaginal  brachytherapy30 gray in 5 fractions as of 09/14/2015. She did well thru end of 11-2015, including at gyn oncology exam 11-20-15 with CA 125 of 13 then. She presented to ED 12-29-15 with ~ 2 weeks progressive abdominal distension and pain, with large volume malignant ascites and large recurrent tumor burden. Cytology [id number] from ascites malignant cells consistent with gyn adenocarcinoma; CA 125 was up to 326 on 12-29-15.  She was hospitalized thru 01-07-16, with paracentesis for 4 liters bloody fluid on 1-28, for 3.5 liters on 01-02-16 and 4 liters on 01-09-16. Chemo was held 01-08-16 due to concerning exam, with bilateral LE venous dopplers no evidence of DVT and CT confirming large ascites (not appreciated on [place] 01-06-16) and bulky tumor without apparent bleed. Patient and family were counseled on very aggressive and advanced recurrent malignancy, understood that any treatment would be in attempt to control disease but not potential cure. Discussion then about life support and advance directives, specifically that such measures would not improve the underlying malignancy; patient and family wanted to consider that information. She requested attempt at treatment, had first adriamycin on 01-10-16 She was receiving PRBCs on 01-11-16 when developed respiratory distress, intubated, and has been supported in ICU with continuous dialysis and pressors.   Objective: Vital signs in last 24 hours: Blood pressure 99/60, pulse 76, temperature 97.4 F (36.3 C), temperature source Oral, resp. rate 20, height 5' 2"  (1.575 m), weight 142 lb 13.7 oz (64.8 kg), SpO2 100 %. Awake, alert, smiling, barely speaking with throat soreness. Respirations not labored at 30 degrees on RA, no cough during exam. PERRL. Tongue without thrush or ulcerations, oral mucosa moist, unable to see posterior pharynx well. Dialysis lines left neck site ok, PAC site ok. NSL right antecubital. BS clear upper fields anteriorly,  diminished lower. Heart RRR.  Abdomen full in upper quadrants but less tight lower, quiet, not tender. No urine in foley tubing. LE swelling improved, moves feet vigorously.  Intake/Output from previous day: 02/19 0701 - 02/20 0700 In: 2826.9 [P.O.:100; I.V.:1384.2; Blood:527.5; IV Piggyback:815.2] Out: 4709 [Urine:5] Intake/Output this shift: Total I/O In: 48.8 [I.V.:48.8] Out: 141 [Other:141]    Lab Results:  Recent Labs  01/21/16 0500 01/22/16 0423  WBC 0.9* 3.1*  HGB 6.9* 9.2*  HCT 19.8* 27.9*  PLT 11* 36*  ANC 2.5  BMET  Recent Labs  01/21/16 1558 01/22/16 0423  NA 136 136  K 4.1 4.2  CL 101 101  CO2 26 26  GLUCOSE 199* 200*  BUN 11 11  CREATININE 0.90 1.10*  CALCIUM 7.8* 7.9*   Last CA 125 on 12-29-15  326, this having been 13 on 11-19-16. Have asked lab to add this onto blood already drawn today, otherwise 01-23-16  Studies/Results: No results found.   Assessment/Plan: 1. High grade endometrial carcinoma rapidly recurrent over just a few weeks by late Jan, with large volume malignant ascites and bulky metastatic involvement abdomen and pelvis. First adriamycin given 01-09-16 in palliative attempt. WBC/ ANC out of neutropenic range today, will stop granix. Hgb and platelets better post transfusions on 01-21-16. Malignant ascites has not reaccumulated nearly as quickly either from some response to chemo or CRRT or both; repeat CA 125 pending. Adriamycin would be due again on 2-28 if we try that further (no renal effects from adriamycin). I would like to see CA 125 prior to considering restaging CT now.  I have been in touch with genetics counselors at [facility], will request MSI/ IHC on original path. Genetics counselors are glad to meet with patient's daughters at [facility] as first step, and they will be in touch with daughters to arrange a time.  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  2.acute hypoxic respiratory failure 01-11-16, initially requiring ventilator support. Breathing has been stable since extubation.  3.acute renal failure related to CT contrast, third spacing fluids with malignant ascites, underlying diabetes. Still on CRRT. I have spoken with Dr Jonnie Finner by phone after my visit today. No recovery of renal function / no urine output.  4.chemo pancytopenia: ANC out of neutropenic range today, hgb and plt better since transfusions on 2-19 and hopefully will hold/ begin to improve 5.Mucositis: stomatitis better,  6.hypotension: still requiring pressors. CRRT still gradually removing fluid. 7.Diabetes covering with insulin 8.Severe protein calorie malnutrition. Patient and family motivated to continue glucerna 9. flu vaccine 01-08-16 10. PAC , dialysis lines 11..Limited code more appropriate than full code in situation of underlying advanced malignancy. If worsens, DNR and comfort care would also be appropriate given underlying situation. Appreciate palliative care team following.    Despite very difficult course and ongoing problems, patient has made some improvement and is very motivated. The recurrent gyn cancer is not curable, with goal of the initial chemo being control / improvement.   Please page me if needed between my rounds  [place]

## 2016-01-23 LAB — CBC WITH DIFFERENTIAL/PLATELET
BASOS PCT: 0 %
Basophils Absolute: 0 10*3/uL (ref 0.0–0.1)
EOS PCT: 0 %
Eosinophils Absolute: 0 10*3/uL (ref 0.0–0.7)
HCT: 24.7 % — ABNORMAL LOW (ref 36.0–46.0)
Hemoglobin: 8.2 g/dL — ABNORMAL LOW (ref 12.0–15.0)
Lymphocytes Relative: 12 %
Lymphs Abs: 0.7 10*3/uL (ref 0.7–4.0)
MCH: 29.7 pg (ref 26.0–34.0)
MCHC: 33.2 g/dL (ref 30.0–36.0)
MCV: 89.5 fL (ref 78.0–100.0)
MONO ABS: 0.2 10*3/uL (ref 0.1–1.0)
Monocytes Relative: 4 %
NEUTROS PCT: 84 %
Neutro Abs: 5.2 10*3/uL (ref 1.7–7.7)
PLATELETS: 35 10*3/uL — AB (ref 150–400)
RBC: 2.76 MIL/uL — ABNORMAL LOW (ref 3.87–5.11)
RDW: 15.4 % (ref 11.5–15.5)
WBC: 6.1 10*3/uL (ref 4.0–10.5)

## 2016-01-23 LAB — CA 125: CA 125: 273.4 U/mL — ABNORMAL HIGH (ref 0.0–38.1)

## 2016-01-23 LAB — COMPREHENSIVE METABOLIC PANEL
ALK PHOS: 98 U/L (ref 38–126)
ALT: 19 U/L (ref 14–54)
AST: 29 U/L (ref 15–41)
Albumin: 1.7 g/dL — ABNORMAL LOW (ref 3.5–5.0)
Anion gap: 8 (ref 5–15)
BUN: 22 mg/dL — ABNORMAL HIGH (ref 6–20)
CALCIUM: 8.2 mg/dL — AB (ref 8.9–10.3)
CO2: 25 mmol/L (ref 22–32)
CREATININE: 2.1 mg/dL — AB (ref 0.44–1.00)
Chloride: 103 mmol/L (ref 101–111)
GFR, EST AFRICAN AMERICAN: 27 mL/min — AB (ref >60)
GFR, EST NON AFRICAN AMERICAN: 24 mL/min — AB (ref >60)
Glucose, Bld: 240 mg/dL — ABNORMAL HIGH (ref 65–99)
Potassium: 4.4 mmol/L (ref 3.5–5.1)
Sodium: 136 mmol/L (ref 135–145)
TOTAL PROTEIN: 5.2 g/dL — AB (ref 6.5–8.1)
Total Bilirubin: 0.5 mg/dL (ref 0.3–1.2)

## 2016-01-23 LAB — GLUCOSE, CAPILLARY
GLUCOSE-CAPILLARY: 225 mg/dL — AB (ref 65–99)
GLUCOSE-CAPILLARY: 180 mg/dL — AB (ref 65–99)
Glucose-Capillary: 221 mg/dL — ABNORMAL HIGH (ref 65–99)

## 2016-01-23 LAB — CORTISOL: CORTISOL PLASMA: 27.9 ug/dL

## 2016-01-23 MED ORDER — FLUCONAZOLE IN SODIUM CHLORIDE 100-0.9 MG/50ML-% IV SOLN
100.0000 mg | INTRAVENOUS | Status: DC
  Administered 2016-01-23: 100 mg via INTRAVENOUS
  Filled 2016-01-23 (×2): qty 50

## 2016-01-23 MED ORDER — MIDODRINE HCL 2.5 MG PO TABS
2.5000 mg | ORAL_TABLET | Freq: Three times a day (TID) | ORAL | Status: DC
  Administered 2016-01-23: 2.5 mg via ORAL
  Filled 2016-01-23 (×3): qty 1

## 2016-01-23 MED ORDER — MIDODRINE HCL 5 MG PO TABS
5.0000 mg | ORAL_TABLET | Freq: Three times a day (TID) | ORAL | Status: DC
  Administered 2016-01-23 – 2016-01-24 (×2): 5 mg via ORAL
  Filled 2016-01-23 (×6): qty 1

## 2016-01-23 MED ORDER — INSULIN ASPART 100 UNIT/ML ~~LOC~~ SOLN
0.0000 [IU] | Freq: Three times a day (TID) | SUBCUTANEOUS | Status: DC
  Administered 2016-01-23: 5 [IU] via SUBCUTANEOUS
  Administered 2016-01-23: 3 [IU] via SUBCUTANEOUS
  Administered 2016-01-24: 5 [IU] via SUBCUTANEOUS
  Administered 2016-01-24: 3 [IU] via SUBCUTANEOUS
  Administered 2016-01-24: 5 [IU] via SUBCUTANEOUS
  Administered 2016-01-25: 3 [IU] via SUBCUTANEOUS
  Administered 2016-01-25: 2 [IU] via SUBCUTANEOUS
  Administered 2016-01-25: 5 [IU] via SUBCUTANEOUS
  Administered 2016-01-26: 2 [IU] via SUBCUTANEOUS
  Administered 2016-01-26: 3 [IU] via SUBCUTANEOUS
  Administered 2016-01-26: 2 [IU] via SUBCUTANEOUS
  Administered 2016-01-27 (×2): 3 [IU] via SUBCUTANEOUS
  Administered 2016-01-27: 2 [IU] via SUBCUTANEOUS
  Administered 2016-01-28 – 2016-01-29 (×4): 3 [IU] via SUBCUTANEOUS
  Administered 2016-01-29: 2 [IU] via SUBCUTANEOUS
  Administered 2016-01-29 – 2016-01-30 (×3): 3 [IU] via SUBCUTANEOUS

## 2016-01-23 NOTE — Progress Notes (Signed)
PULMONARY / CRITICAL CARE MEDICINE   Name: Brittany Schwartz MRN: [id number] DOB: 23-Mar-1951    ADMISSION DATE:  01/11/2016 CONSULTATION DATE:  01/11/2016  REFERRING MD:  Brittany Schwartz  CHIEF COMPLAINT:  Respiratory distress  HISTORY OF PRESENT ILLNESS:   65 year old female with PMH as below, which includes grade 3 serous/ endometrioid endometrial carcinoma IIIA at diagnosis, adjuvant chemo completed 06/16/15.  Now with rapid recurrent disease since mid January with large volume malignant ascites, bulky involvement pelvis and abdominal caking.  She is followed by Brittany Schwartz with oncology and is currently undergoing chemo.  Family reports that oncologist feels as though there is hope for improvement with chemotherapy. She received last dose of chemotherapy 2/8. She had 2u PRBC transfusion 2/9 for symptomatic anemia.  During transfusion, she complained of SOB with diaphoresis.  She was subsequently sent to ED for further evaluation.  In ED, she was hypertensive and remained SOB.  She was therefore placed on BiPAP. CXR showed significant pulmonary edema. Laboratory assessment was significant for renal failure with K 7.2, Na 119 (chronically low), and SCr 3.61. She is also hyperglycemic with Glucose 369. Lab eval also notable for WBC 20.2. CT abdomen from a few days prior consistent with progressive mass size with compression of L ureter and L hydronephrosis. She was unable to tolerate BiPAP in ED and required intubation.  PCCM asked to admit.  Extensive discussion with family by Dr. Titus Schwartz.  Given Brittany Schwartz's hx and current circumstances, decision made to limit code status to intubation and vasopressors only.  In the event of cardiac arrest, we would not put her through CPR or defibrillation.  Full medical management otherwise.  STUDIES:  TTE 1/31:  LV EF 65-70%. Normal wall motion. Grade 1 diastolic dysfunction. LA & RA normal in size. RV normal in size & function. PASP 21 mmHg. Normal aortic & mitral  valves. No pericardial effusion. Port CXR 2/11: No opacity. ETT in good position. R IJ CVL in good position.  TTE 2/11:  EF 65-70% w/ normal wall motion. Grade 1 diastolic dysfunction. RV normal in size & function. No effusion.  MICROBIOLOGY: Blood Ctx x2 2/9:  Negative  Urine Ctx 2/9:  Negative  Blood Ctx x2 2/3:  Negative  Urine Ctx 2/2:  Negative   ANTIBIOTICS: Vanc 02/09 > Zosyn 02/09 >  SIGNIFICANT EVENTS: 02/09 > admitted with acute hypoxic respiratory failure, multiple electrolyte derangements. 02/10 > started CVVHD 02/11 > ureteral stent placed 02/12 > extubated 2/20: stopped CRRT  LINES/TUBES: R IJ HD cath 02/09 > Foley 2/9 > R Chest Port 1/31 > OETT 7.5 02/09 - 2/12 OGT 2/9 - 2/12  SUBJECTIVE:  Awake and interacting,  No significant changes o/n CVVHD running  REVIEW OF SYSTEMS:   Sore throat improving norepi up slightly to 13 CVVHD running  VITAL SIGNS: BP 121/49 mmHg  Pulse 90  Temp(Src) 98.9 F (37.2 C) (Axillary)  Resp 18  Ht 5\' 2"  (1.575 m)  Wt 143 lb 8.3 oz (65.1 kg)  BMI 26.24 kg/m2  SpO2 100%  HEMODYNAMICS:    VENTILATOR SETTINGS:    INTAKE / OUTPUT: I/O last 3 completed shifts: In: 1905 [P.O.:250; I.V.:1447.8; IV Piggyback:207.2] Out: 2236 [Urine:50; Other:2186]  PHYSICAL EXAMINATION: General:  Awake, laying in bed. No acute distress.  Integument:  Warm & dry. No rash on exposed skin.  HEENT:  No scleral injection. Thrush present on tongue. Cardiovascular:  Regular rhythm. Anasarca worsening. Unable to appreciate JVD.  Pulmonary: Clear to auscultation bilaterally.  Normal work of breathing on room air. Abdomen: Protuberant. Firm. Hypoactive bowel sounds. Mildly uncomfortable w/ palpation. A little larger today  Neurological:  Awake.  Following commands.  LABS:  BMET  Recent Labs Lab 01/21/16 1558 01/22/16 0423 01/23/16 0500  NA 136 136 136  K 4.1 4.2 4.4  CL 101 101 103  CO2 26 26 25   BUN 11 11 22*  CREATININE 0.90  1.10* 2.10*  GLUCOSE 199* 200* 240*    Electrolytes  Recent Labs Lab 01/20/16 0600  01/21/16 0500 01/21/16 1558 01/22/16 0423 01/23/16 0500  CALCIUM 7.3*  < > 7.9* 7.8* 7.9* 8.2*  MG 2.3  --  2.4  --  2.5*  --   PHOS 1.9*  < > 1.8* 3.1 2.6  --   < > = values in this interval not displayed.  CBC  Recent Labs Lab 01/21/16 0500 01/22/16 0423 01/23/16 0500  WBC 0.9* 3.1* 6.1  HGB 6.9* 9.2* 8.2*  HCT 19.8* 27.9* 24.7*  PLT 11* 36* 35*    Coag's No results for input(s): APTT, INR in the last 168 hours.  Sepsis Markers No results for input(s): LATICACIDVEN, PROCALCITON, O2SATVEN in the last 168 hours.  ABG No results for input(s): PHART, PCO2ART, PO2ART in the last 168 hours.  Liver Enzymes  Recent Labs Lab 01/21/16 1558 01/22/16 0423 01/23/16 0500  AST  --   --  29  ALT  --   --  19  ALKPHOS  --   --  98  BILITOT  --   --  0.5  ALBUMIN 1.7* 1.9* 1.7*    Cardiac Enzymes No results for input(s): TROPONINI, PROBNP in the last 168 hours.  Glucose  Recent Labs Lab 01/21/16 2120 01/22/16 0739 01/22/16 1110 01/22/16 1739 01/22/16 2120 01/23/16 0833  GLUCAP 170* 173* 151* 183* 206* 225*    Imaging No results found.  ASSESSMENT / PLAN:    Unfortunate 65 year old female with Grade 3 serous/endometriod endometrial carcinoma with recurrence. Patient continuing on CVVHD with hypotension (on norepi). Suspect patient's pain with swallowing secondary to oral thrush. Prognosis dismal. Patient's pancytopenia continuing to worsen.CRRT stopped. Will see if creatinine and renal fxn recovers.    Recurrent Endometrial Cancer:  Oncology following. Known malignant ascites. Patient s/p fist dose of chemotherapy recently.  Plan trending cell counts daily with CBC. Fentanyl IV prn.  Acute Encephalopathy:  Multifactorial. Fluctuates and likely complicated by delirium.  Plan Balancing comfort from abdominal pain versus worsening mentation with narcotics.  Acute Renal  Failure in setting of hypoperfusion and malignant obstructive uropathy. S/p bilateral ureteral stents 2/11.  Just stopped CRRT on 2/20. Wt is down 12 kg after 11 days.  Scr trending up  Plan F/u daily creatinine and UOP  Septic Shock (NOS): Completed Day #10 of 10  of empiric Vancomycin & Zosyn. Still pressor dependent Plan Stop CRRT Allow for even or positive balance  Wean levo as able Add low dose midodrine    Elevated Troponin I:  Likely demand ischemia in setting of shock. No wall motion abnormalities on TTE to suggest MI. Plan Tele  Possible Adrenal Insufficiency:  Discontinued stress dose steroids after 5 days on 2/13. Plan Check repeat cortisol today   Oral thrush Plan:  cont nystatin swish and swallow 4 times a day.  Cough: may be related to thrush can't r/o aspiration Plan Cont thin liq diet   Malignant Ascites:  Unable to perform paracentesis either by Dr Ashok Cordia or IR. Plan Monitor will repeat  US when symptom burden dictates  Pancytopenia:  Slowly worsening. No signs of active bleeding. Likely secondary to chemotherapy.  Plan Trending cell counts daily.  Neutropenic precautions.  Would transfuse plt to keep > 10k even in absence bleeding   H/O DM:  BG controlled.  Plan Low dose SSI algorithm & accu-checks to qAC & HS.  Acute Hypoxic Respiratory Failure:  Resolved. Extubated 2/12.   Plan Continuing pulmonary toilette with IS as mental May need therapeutic paracentesis at some point but would like to defer this for as long as possible   Protein calorie malnutrition Plan  Continue diabetic diet. Continuing Glucerna. Nutrition consulted for recommendations.  Physical deconditioning Plan OOB   Disposition:  Family and patient want to continue current care for now. Efforts being made to get family to [place].  Wrote letter on behalf of her family member for [place] to enter [place] from [place] 2/18. From a cancer stand-point the goal is to palliate and slow  progression. The immediate hope is for renal recovery. Family understands this could be a barrier we may not overcome. For now we will focus on maintaining even volume status, mobility and safety w/ swallowing. Add midodrine   Family Updates:  Status and plans discussed with family at bedside 2/20   Erick Colace ACNP-BC [place] Pager # [phone] OR # [phone] if no answer

## 2016-01-23 NOTE — Progress Notes (Signed)
Pharmacy Antibiotic Note  Docie Hubley is a 65 y.o. female admitted on 01/11/2016 with sepsis/shock.  Pharmacy has been consulted for Vancomycin and Zosyn dosing. She remains on CRRT. Mucositis/stomatitis/esophagitis likely from adriamycin, possible thrush or HSV so started on acyclovir per pharmacy and fluconazole as well as carafate, visc lidocaine, and nystatin.  Plan:  Mucositis reportedly improved.   CRRT was stopped 2/20. SCr increased, will adjust doses accordingly  Cont acyclovir 5mg /kg IV q24h x 7d total (ends on 2/24).   Decrease fluconazole 100mg  IV q24h x7d total (ends on 2/24).  Height: 5\' 2"  (157.5 cm) Weight: 143 lb 8.3 oz (65.1 kg) IBW/kg (Calculated) : 50.1  Temp (24hrs), Avg:99.3 F (37.4 C), Min:98.9 F (37.2 C), Max:99.7 F (37.6 C)   Recent Labs Lab 01/18/16 0835  01/19/16 0420  01/20/16 0600 01/20/16 1620 01/21/16 0500 01/21/16 1558 01/22/16 0423 01/23/16 0500  WBC  --   --  0.4*  --  0.4*  --  0.9*  --  3.1* 6.1  CREATININE  --   < >  --   < > 0.94 0.93 0.99 0.90 1.10* 2.10*  VANCOTROUGH 17  --   --   --   --   --   --   --   --   --   < > = values in this interval not displayed.  Estimated Creatinine Clearance: 23.7 mL/min (by C-G formula based on Cr of 2.1).    No Known Allergies  Antimicrobials this admission: 2/9 >> Vanc >> 2/20 2/9 >> Zosyn >> 2/20 2/17 >> Acyclovir >> (2/24) 2/17 >> Fluconazole >> (2/24)  Levels/dose changes this admission: 2/10: with start of CRRT, changed Zosyn to 3.375g IV q6h and started Vanc dosing at 1g q24h. 2/16: VT = 63mcg/ml on Vanc 1g q24h(prior to 6th dose), cont same.  Microbiology results: 2/10 MRSA: neg 2/9 urine: 8K insignificant growth  2/9 blood: NGF  Thank you for allowing pharmacy to be a part of this patient's care.  Peggyann Juba, PharmD, BCPS Pager: [phone] 01/23/2016,10:28 AM.

## 2016-01-23 NOTE — Progress Notes (Signed)
[facility] Progress Note   Subjective: Off CRRT   Filed Vitals:   01/23/16 0800 01/23/16 1000 01/23/16 1100 01/23/16 1200  BP: 121/49 100/51    Pulse: 90  84   Temp:    98.9 F (37.2 C)  TempSrc:      Resp: 18 20 21    Height:      Weight:      SpO2: 100%  100%    Exam: BP 100/51 mmHg  Pulse 84  Temp(Src) 98.9 F (37.2 C) (Axillary)  Resp 21  Ht 5\' 2"  (1.575 m)  Wt 65.1 kg (143 lb 8.3 oz)  BMI 26.24 kg/m2  SpO2 100% Awake today, up in chair Room air No distress, looks tired No jvd Line left IJ for CRRT (2/9) Chest clear  Regular, HR 80's Abd moderate 2-3 +ascites, nontender + bs 2+ diffuse bilat LE pitting edema up to the hips Neuro gen weakness , Ox 3   Weight trending 01/21/16  66.7 kg (147 lb 0.8 oz)    01/20/16 0421 68.8 kg (151 lb 10.8 oz)  01/19/16 0421 72.3 kg (159 lb 6.3 oz)  01/18/16 0421 73 kg (160 lb 15 oz)  01/17/16 0500 76.1 kg (167 lb 12.3 oz)  01/16/16 0500 75.8 kg (167 lb 1.7 oz)  01/15/16 0429 78.3 kg (172 lb 9.9 oz)  01/14/16 0500 73.6 kg (162 lb 4.1 oz)  01/13/16 0400 76.1 kg (167 lb 12.3 oz)  01/12/16 0334 78.6 kg (173 lb 4.5 oz)  01/12/16 0000 78 kg     Inpatient medications: . acyclovir  5 mg/kg Intravenous Q24H  . antiseptic oral rinse  7 mL Mouth Rinse BID  . feeding supplement (GLUCERNA SHAKE)  237 mL Oral TID BM  . fluconazole (DIFLUCAN) IV  100 mg Intravenous Q24H  . insulin aspart  0-15 Units Subcutaneous TID WC  . insulin aspart  0-5 Units Subcutaneous QHS  . latanoprost  1 drop Both Eyes QHS  . midodrine  5 mg Oral TID WC  . nystatin  5 mL Oral QID  . pantoprazole  40 mg Oral Daily  . sodium chloride flush  10-40 mL Intracatheter Q12H  . sucralfate  1 g Oral 4 times per day  . timolol  1 drop Both Eyes BID  Infusions: . norepinephrine (LEVOPHED) Adult infusion 6 mcg/min (01/23/16 1407)   Prn medications sodium chloride, fentaNYL (SUBLIMAZE) injection, heparin, lidocaine, LORazepam, phenol, sodium  chloride flush    Recent Labs Lab 01/21/16 0500 01/21/16 1558 01/22/16 0423 01/23/16 0500  NA 139 136 136 136  K 4.4 4.1 4.2 4.4  CL 103 101 101 103  CO2 27 26 26 25   GLUCOSE 197* 199* 200* 240*  BUN 12 11 11  22*  CREATININE 0.99 0.90 1.10* 2.10*  CALCIUM 7.9* 7.8* 7.9* 8.2*  PHOS 1.8* 3.1 2.6  --     Recent Labs Lab 01/21/16 1558 01/22/16 0423 01/23/16 0500  AST  --   --  29  ALT  --   --  19  ALKPHOS  --   --  98  BILITOT  --   --  0.5  PROT  --   --  5.2*  ALBUMIN 1.7* 1.9* 1.7*    Recent Labs Lab 01/21/16 0500 01/22/16 0423 01/23/16 0500  WBC 0.9* 3.1* 6.1  NEUTROABS 0.5* 2.5 5.2  HGB 6.9* 9.2* 8.2*  HCT 19.8* 27.9* 24.7*  MCV 89.6 88.6 89.5  PLT 11* 36* 35*    CXR 2/11 >  normal cxr CT chest 2/9 > perihilar edema, bibasilar consolidation L> R ECHO 1/31 > normal LV/ RV  Assessment/Plan: 1 AKI  (contrast/ hypotension/ L hydro/ large vol paracentesis x 2) - oliguric. Off CRRT d#1. See how she does off CRRT, see if renal function will recover or not.  Remains hypotensive on pressors so don't think that intermittent HD is an option now. If renal function doesn't improve then options will be to resume CRRT vs transition to less aggressive care.  2 Recurrent metastatic endometrial cancer- recurrent, s/p adriamycin 2/8 as salvage for recurrent disease, not a situation of potential cure per oncology.  3 Septic shock- off of abx, on pressors 4 Pancytopenia - plts down, wbc better 5 DM on oral agent 6 Malnutrition/ hypoalbuminemia 7 Anemia 8 Hypotension - increasing midodrine to 5 mg tid 9 Disposition - limited code.   Kelly Splinter MD [employer] pager [phone]    cell [phone] 01/23/2016, 2:37 PM

## 2016-01-23 NOTE — Progress Notes (Signed)
Speech Language Pathology Treatment: Dysphagia  Patient Details Name: Brittany Schwartz MRN: [id number] DOB: 10-23-1951 Today's Date: 01/23/2016 Time: 1550-1600 SLP Time Calculation (min) (ACUTE ONLY): 10 min  Assessment / Plan / Recommendation Clinical Impression  Pt and family report improved swallow ability and strength of voice today.    Family reports pt consumed applesauce, some broth and minimal pureed soup today.  They admit she is coughing after swallowing liquids = and is not coughing if not eating.  Overt cough noted with liquids via cup - advised to back down amount to via tsp only to decreased bolus amount and maximize comfort with intake.  Today pt reports her swallow ability is better but that she does not feel hungry.    Suspect dysphagia, odynophagia and lack of appetite impacting her ability to meet nutritional needs. Pt reports decreased odynophagia - hopefully treatment for thrush and carate are helping.   Pt continues with afebrile status and clear but decreased lung sounds.  Encouraged her to continue po as she tolerates with very strict precautions.    Hopeful for continued improvement with swallow ability as chemo effects abate and treatment continues.  Family present and educated to findings/recommendations.    HPI HPI: 65 year old female with 3 serous/ endometrioid endometrial carcinoma IIIA at diagnosis, adjuvant chemo completed 06/16/15. Rapid recurrent disease since mid January with large volume malignant ascites, bulky involvement pelvis and abdominal caking. Dr. Marko Plume is pt's oncologist and pt was undergoing chemo. Family reports to MD that oncologist feels there is hope with chemotherapy.  She received last dose of chemotherapy 2/8 and had 2u PRBC transfusion 2/9 for symptomatic anemia. During transfusion, she complained of SOB with diaphoresis per Md notes. Pt required intubation OETT 2/9-2/12 and had renal failure requiring CVVHD 2/10-2/20.  RN reports pt with  coughing with intake, ? secondary to aspiration or thrush related per [facility] note? Per [facility] note, pt with very slight mucositis localized to left soft palate only.  DG abdomen did not indicate need for paracentesis at this time.        SLP Plan    follow up    Recommendations  Diet recommendations: Regular;Thin liquid Liquids provided via: Teaspoon Medication Administration: Whole meds with liquid (problematic pills with applesauce) Supervision: Trained caregiver to feed patient Compensations: Slow rate;Small sips/bites;Multiple dry swallows after each bite/sip;Hard cough after swallow Postural Changes and/or Swallow Maneuvers: Seated upright 90 degrees;Out of bed for meals                   [place], [place]/[facility]

## 2016-01-23 NOTE — Progress Notes (Signed)
Date: January 23, 2016 Chart reviewed for concurrent status and case management needs. Will continue to follow patient for changes and needs:  Iv levophed for pressor support Velva Harman, BSN, [place], [place]   [phone]

## 2016-01-24 DIAGNOSIS — K1231 Oral mucositis (ulcerative) due to antineoplastic therapy: Secondary | ICD-10-CM

## 2016-01-24 LAB — CBC WITH DIFFERENTIAL/PLATELET
BAND NEUTROPHILS: 0 %
BASOS ABS: 0 10*3/uL (ref 0.0–0.1)
BASOS PCT: 0 %
BLASTS: 0 %
EOS ABS: 0 10*3/uL (ref 0.0–0.7)
Eosinophils Relative: 0 %
HCT: 23.9 % — ABNORMAL LOW (ref 36.0–46.0)
HEMOGLOBIN: 8 g/dL — AB (ref 12.0–15.0)
LYMPHS ABS: 1.4 10*3/uL (ref 0.7–4.0)
LYMPHS PCT: 15 %
MCH: 30 pg (ref 26.0–34.0)
MCHC: 33.5 g/dL (ref 30.0–36.0)
MCV: 89.5 fL (ref 78.0–100.0)
METAMYELOCYTES PCT: 0 %
MONO ABS: 0.5 10*3/uL (ref 0.1–1.0)
MONOS PCT: 5 %
Myelocytes: 1 %
NEUTROS ABS: 7.3 10*3/uL (ref 1.7–7.7)
Neutrophils Relative %: 79 %
OTHER: 0 %
PLATELETS: 48 10*3/uL — AB (ref 150–400)
Promyelocytes Absolute: 0 %
RBC: 2.67 MIL/uL — ABNORMAL LOW (ref 3.87–5.11)
RDW: 15.7 % — AB (ref 11.5–15.5)
WBC: 9.2 10*3/uL (ref 4.0–10.5)
nRBC: 1 /100 WBC — ABNORMAL HIGH

## 2016-01-24 LAB — BASIC METABOLIC PANEL
Anion gap: 8 (ref 5–15)
BUN: 33 mg/dL — AB (ref 6–20)
CALCIUM: 8.2 mg/dL — AB (ref 8.9–10.3)
CO2: 26 mmol/L (ref 22–32)
CREATININE: 2.95 mg/dL — AB (ref 0.44–1.00)
Chloride: 102 mmol/L (ref 101–111)
GFR, EST AFRICAN AMERICAN: 18 mL/min — AB (ref >60)
GFR, EST NON AFRICAN AMERICAN: 16 mL/min — AB (ref >60)
Glucose, Bld: 193 mg/dL — ABNORMAL HIGH (ref 65–99)
Potassium: 4 mmol/L (ref 3.5–5.1)
SODIUM: 136 mmol/L (ref 135–145)

## 2016-01-24 LAB — GLUCOSE, CAPILLARY
GLUCOSE-CAPILLARY: 236 mg/dL — AB (ref 65–99)
GLUCOSE-CAPILLARY: 213 mg/dL — AB (ref 65–99)
GLUCOSE-CAPILLARY: 152 mg/dL — AB (ref 65–99)
Glucose-Capillary: 238 mg/dL — ABNORMAL HIGH (ref 65–99)
Glucose-Capillary: 143 mg/dL — ABNORMAL HIGH (ref 65–99)

## 2016-01-24 MED ORDER — POLYETHYLENE GLYCOL 3350 17 G PO PACK
17.0000 g | PACK | Freq: Every day | ORAL | Status: DC | PRN
  Administered 2016-01-24: 17 g via ORAL
  Filled 2016-01-24: qty 1

## 2016-01-24 MED ORDER — MIDODRINE HCL 5 MG PO TABS
10.0000 mg | ORAL_TABLET | Freq: Three times a day (TID) | ORAL | Status: DC
  Administered 2016-01-24 – 2016-01-30 (×19): 10 mg via ORAL
  Filled 2016-01-24 (×25): qty 2

## 2016-01-24 NOTE — Progress Notes (Signed)
[facility] Progress Note   Subjective: 300 cc UOP yest, urine clearing up  Filed Vitals:   01/24/16 0400 01/24/16 0500 01/24/16 0600 01/24/16 0800  BP: 109/55 106/51 89/47   Pulse: 81 81 88   Temp: 97.7 F (36.5 C)   98.4 F (36.9 C)  TempSrc: Axillary   Axillary  Resp: 17 19 18    Height:      Weight:      SpO2: 100% 100% 100%    Exam: BP 89/47 mmHg  Pulse 88  Temp(Src) 98.4 F (36.9 C) (Axillary)  Resp 18  Ht 5\' 2"  (1.575 m)  Wt 65.1 kg (143 lb 8.3 oz)  BMI 26.24 kg/m2  SpO2 100% Awake today, up in chair Room air No distress, looks tired No jvd Line left IJ for CRRT (2/9) Chest clear  Regular, HR 80's Abd moderate 2-3 +ascites, nontender + bs 2+ diffuse bilat LE pitting edema up to the hips Neuro gen weakness , Ox 3   Weight trending 01/21/16  66.7 kg (147 lb 0.8 oz)    01/20/16 0421 68.8 kg (151 lb 10.8 oz)  01/19/16 0421 72.3 kg (159 lb 6.3 oz)  01/18/16 0421 73 kg (160 lb 15 oz)  01/17/16 0500 76.1 kg (167 lb 12.3 oz)  01/16/16 0500 75.8 kg (167 lb 1.7 oz)  01/15/16 0429 78.3 kg (172 lb 9.9 oz)  01/14/16 0500 73.6 kg (162 lb 4.1 oz)  01/13/16 0400 76.1 kg (167 lb 12.3 oz)  01/12/16 0334 78.6 kg (173 lb 4.5 oz)  01/12/16 0000 78 kg     Inpatient medications: . acyclovir  5 mg/kg Intravenous Q24H  . antiseptic oral rinse  7 mL Mouth Rinse BID  . feeding supplement (GLUCERNA SHAKE)  237 mL Oral TID BM  . fluconazole (DIFLUCAN) IV  100 mg Intravenous Q24H  . insulin aspart  0-15 Units Subcutaneous TID WC  . insulin aspart  0-5 Units Subcutaneous QHS  . latanoprost  1 drop Both Eyes QHS  . midodrine  10 mg Oral TID WC  . nystatin  5 mL Oral QID  . pantoprazole  40 mg Oral Daily  . sodium chloride flush  10-40 mL Intracatheter Q12H  . sucralfate  1 g Oral 4 times per day  . timolol  1 drop Both Eyes BID  Infusions: . norepinephrine (LEVOPHED) Adult infusion Stopped (01/24/16 0832)   Prn medications sodium chloride, fentaNYL  (SUBLIMAZE) injection, heparin, lidocaine, LORazepam, phenol, sodium chloride flush    Recent Labs Lab 01/21/16 0500 01/21/16 1558 01/22/16 0423 01/23/16 0500 01/24/16 0540  NA 139 136 136 136 136  K 4.4 4.1 4.2 4.4 4.0  CL 103 101 101 103 102  CO2 27 26 26 25 26   GLUCOSE 197* 199* 200* 240* 193*  BUN 12 11 11  22* 33*  CREATININE 0.99 0.90 1.10* 2.10* 2.95*  CALCIUM 7.9* 7.8* 7.9* 8.2* 8.2*  PHOS 1.8* 3.1 2.6  --   --     Recent Labs Lab 01/21/16 1558 01/22/16 0423 01/23/16 0500  AST  --   --  29  ALT  --   --  19  ALKPHOS  --   --  98  BILITOT  --   --  0.5  PROT  --   --  5.2*  ALBUMIN 1.7* 1.9* 1.7*    Recent Labs Lab 01/22/16 0423 01/23/16 0500 01/24/16 0540  WBC 3.1* 6.1 9.2  NEUTROABS 2.5 5.2 7.3  HGB 9.2* 8.2* 8.0*  HCT  27.9* 24.7* 23.9*  MCV 88.6 89.5 89.5  PLT 36* 35* 48*    CXR 2/11 > normal cxr CT chest 2/9 > perihilar edema, bibasilar consolidation L> R ECHO 1/31 > normal LV/ RV  Assessment: 1 AKI  (contrast/ hypotension/ L hydro/ large vol paracentesis x 2) - oliguric. Off CRRT d#2. Starting to make urine, 300 cc yest. Creat up 2.9 as expected. No sign of resp compromise.  No need for RRT at this point.  Cont to follow.  2 Recurrent metastatic endometrial cancer- recurrent, s/p adriamycin 2/8 as salvage for recurrent disease, not a situation of potential cure per oncology.  3 Septic shock- off of abx, on pressors 4 Pancytopenia - plts down, wbc better 5 DM on oral agent 6 Malnutrition/ hypoalbuminemia 7 Anemia 8 Hypotension - midodrine added here 9 Disposition - limited code.   Plan - increase midodrine, taper off pressors. Check creat in am.   Kelly Splinter MD [place] pager [phone]    cell [phone] 01/24/2016, 8:43 AM

## 2016-01-24 NOTE — Progress Notes (Signed)
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  Now with rapid recurrent disease since mid January with large volume malignant ascites, bulky involvement pelvis and abdominal caking.  She is followed by Dr. Marko Schwartz with oncology and is currently undergoing chemo.  Family reports that oncologist feels as though there is hope for improvement with chemotherapy. She received last dose of chemotherapy 2/8. She had 2u PRBC transfusion 2/9 for symptomatic anemia.  During transfusion, she complained of SOB with diaphoresis.  She was subsequently sent to ED for further evaluation.  In ED, she was hypertensive and remained SOB.  She was therefore placed on BiPAP. CXR showed significant pulmonary edema. Laboratory assessment was significant for renal failure with K 7.2, Na 119 (chronically low), and SCr 3.61. She is also hyperglycemic with Glucose 369. Lab eval also notable for WBC 20.2. CT abdomen from a few days prior consistent with progressive mass size with compression of L ureter and L hydronephrosis. She was unable to tolerate BiPAP in ED and required intubation.  PCCM asked to admit.  Extensive discussion with family by Brittany Schwartz.  Given Brittany Schwartz's hx and current circumstances, decision made to limit code status to intubation and vasopressors only.  In the event of cardiac arrest, we would not put her through CPR or defibrillation.  Full medical management otherwise.  STUDIES:  TTE 1/31:  LV EF 65-70%. Normal wall motion. Grade 1 diastolic dysfunction. LA & RA normal in size. RV normal in size & function. PASP 21 mmHg. Normal aortic & mitral  valves. No pericardial effusion. Port CXR 2/11: No opacity. ETT in good position. R IJ CVL in good position.  TTE 2/11:  EF 65-70% w/ normal wall motion. Grade 1 diastolic dysfunction. RV normal in size & function. No effusion.  MICROBIOLOGY: Blood Ctx x2 2/9:  Negative  Urine Ctx 2/9:  Negative  Blood Ctx x2 2/3:  Negative  Urine Ctx 2/2:  Negative   ANTIBIOTICS: Vanc 02/09 >2/19 Zosyn 02/09 >2/19 Acyclovir 2/17 >>>  SIGNIFICANT EVENTS: 02/09 > admitted with acute hypoxic respiratory failure, multiple electrolyte derangements. 02/10 > started CVVHD 02/11 > ureteral stent placed 02/12 > extubated 2/20 > stopped CRRT  LINES/TUBES: R IJ HD cath 02/09 > Foley 2/9 > R Chest Port 1/31 > OETT 7.5 02/09 - 2/12 OGT 2/9 - 2/12  SUBJECTIVE:  Awake and interacting,  Levophed paused.  No significant changes o/n  REVIEW OF SYSTEMS:   Esophagitis improving  Passing Flatus   VITAL SIGNS: BP 121/62 mmHg  Pulse 88  Temp(Src) 98.4 F (36.9 C) (Axillary)  Resp 19  Ht 5\' 2"  (1.575 m)  Wt 143 lb 8.3 oz (65.1 kg)  BMI 26.24 kg/m2  SpO2 100%  HEMODYNAMICS:    VENTILATOR SETTINGS:    INTAKE / OUTPUT: I/O last 3 completed shifts: In: 1312.8 [P.O.:250; I.V.:905.6; IV Piggyback:157.2] Out: 320 [Urine:320]  PHYSICAL EXAMINATION: General:  Awake, laying in bed. No acute distress.  Integument:  Warm & dry. No rash on exposed skin.  HEENT:  No scleral injection. Thrush present on tongue. Cardiovascular:  Regular rhythm. Anasarca worsening. Unable to appreciate JVD, +2 bilateral LE pitting edema  Pulmonary: Clear to auscultation bilaterally. Normal work of breathing on room air. Abdomen: Protuberant. Firm. Hypoactive bowel sounds. Mildly uncomfortable w/ palpation.  Neurological:  Awake.  Following commands.  LABS:  BMET  Recent Labs Lab 01/22/16 0423 01/23/16 0500 01/24/16 0540  NA 136 136 136  K 4.2 4.4 4.0  CL 101 103 102  CO2 26 25 26   BUN 11 22* 33*   CREATININE 1.10* 2.10* 2.95*  GLUCOSE 200* 240* 193*    Electrolytes  Recent Labs Lab 01/20/16 0600  01/21/16 0500 01/21/16 1558 01/22/16 0423 01/23/16 0500 01/24/16 0540  CALCIUM 7.3*  < > 7.9* 7.8* 7.9* 8.2* 8.2*  MG 2.3  --  2.4  --  2.5*  --   --   PHOS 1.9*  < > 1.8* 3.1 2.6  --   --   < > = values in this interval not displayed.  CBC  Recent Labs Lab 01/22/16 0423 01/23/16 0500 01/24/16 0540  WBC 3.1* 6.1 9.2  HGB 9.2* 8.2* 8.0*  HCT 27.9* 24.7* 23.9*  PLT 36* 35* 48*    Coag's No results for input(s): APTT, INR in the last 168 hours.  Sepsis Markers No results for input(s): LATICACIDVEN, PROCALCITON, O2SATVEN in the last 168 hours.  ABG No results for input(s): PHART, PCO2ART, PO2ART in the last 168 hours.  Liver Enzymes  Recent Labs Lab 01/21/16 1558 01/22/16 0423 01/23/16 0500  AST  --   --  29  ALT  --   --  19  ALKPHOS  --   --  98  BILITOT  --   --  0.5  ALBUMIN 1.7* 1.9* 1.7*    Cardiac Enzymes No results for input(s): TROPONINI, PROBNP in the last 168 hours.  Glucose  Recent Labs Lab 01/22/16 2120 01/23/16 0833 01/23/16 1118 01/23/16 1606 01/23/16 2206 01/24/16 0815  GLUCAP 206* 225* 221* 180* 236* 213*    Imaging No results found.  ASSESSMENT / PLAN:    Unfortunate 65 year old female with Grade 3 serous/endometriod endometrial carcinoma with recurrence. Suspect patient's pain with swallowing secondary to oral thrush. Prognosis dismal. Patient's pancytopenia continuing to worsen.CRRT stopped. Now making urine. Will see if creatinine and renal fxn recovers.    Recurrent Endometrial Cancer:  Oncology following. Known malignant ascites. Patient s/p fist dose of chemotherapy recently.  Plan trending cell counts daily with CBC.  Fentanyl IV prn.  Acute Encephalopathy:  Multifactorial. Fluctuates and likely complicated by delirium.  Plan Balancing comfort from abdominal pain versus worsening mentation with  narcotics.  Acute Renal Failure in setting of hypoperfusion and malignant obstructive uropathy. S/p bilateral ureteral stents 2/11.  Just stopped CRRT on 2/20. Wt is down 12 kg after 11 days.  Scr trending up Plan Per nephrology  F/u daily creatinine and UOP  Septic Shock (NOS): Completed Day #10 of 10  of empiric Vancomycin & Zosyn.  Plan Allow for even or positive balance  Added low dose midodrine    Elevated Troponin I:  Likely demand ischemia in setting of shock. No wall motion abnormalities on TTE to suggest MI. Plan Tele  Possible Adrenal Insufficiency:  Discontinued stress dose steroids after 5 days on 2/13. Cortisol 2/22 27.9 Plan No supplementation indicated   Oral thrush Plan:  cont nystatin swish and swallow 4 times a day.  Cough: may be related to thrush can't r/o aspiration Plan Cont thin liq diet   Malignant Ascites:  Unable to perform paracentesis either by Dr Ashok Cordia or IR. Plan Monitor  will repeat US when symptom burden dictates  Pancytopenia:  Slowly worsening. No signs of active bleeding. Likely secondary to chemotherapy.  Plan Trending cell counts daily.  Neutropenic precautions.  Would transfuse plt to keep > 10k even in absence bleeding   H/O DM:  BG controlled.  Plan Low dose SSI algorithm & accu-checks to qAC & HS.  Acute Hypoxic Respiratory Failure:  Resolved. Extubated 2/12.   Plan Continuing pulmonary toilette with IS as mental May need therapeutic paracentesis at some point but would like to defer this for as long as possible   Protein calorie malnutrition Plan  Continue diabetic diet. Continuing Glucerna. Nutrition consulted for recommendations.  Physical deconditioning Plan OOB   Disposition:  Family and patient want to continue current care for now. Efforts being made to get family to [place].  Wrote letter on behalf of her family member for [place] to enter [place] from [place] 2/18. From a cancer stand-point the goal is to palliate  and slow progression. The immediate hope is for renal recovery. Family understands this could be a barrier we may not overcome. For now we will focus on maintaining even volume status, mobility and safety w/ swallowing. Family Updates:  Status and plans discussed with family at bedside

## 2016-01-24 NOTE — Progress Notes (Signed)
Nutrition Follow-up  DOCUMENTATION CODES:   Not applicable  INTERVENTION:  - Continue Glucerna TID - Will order Magic Cup BID with meals, each supplement provides 290 kcal and 9 grams of protein - Continue to encourage PO intakes - RD will continue to monitor for needs  NUTRITION DIAGNOSIS:   Inadequate oral intake related to poor appetite as evidenced by per patient/family report. 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 Now with rapid recurrent disease since mid January with large volume malignant ascites, bulky involvement pelvis and abdominal caking.  2/22 Pt off CRRT since 2/20 with nephrology continuing to monitor for further needs. Palliative Care meeting 2/20 and plan for follow-up meeting in the near future to further discuss [facility]. Pt on Vegetarian diet which daughter, who is at bedside, reports is appropriate as pt eats dairy but otherwise no animal products. Daughter provides all information during RD visit. She states that pt has reported poor appetite which was further decreased by difficulty swallowing the past three days (notes indicate pt with oral thrush) but that this has slightly resolved today. She states that pt has been consuming only bites and focuses on items such as soup, applesauce, and crackers.  Daughter reports that attempt was made to make popsicles out of Glucerna. Talked with her about Magic Cup and she states pt was provided with this x1 a few days ago and enjoyed this supplement; will place order for Magic Cup BID.   Pt is not meeting needs. Needs adjusted based related to metastatic endometrial cancer. Notes also indicate pt with malignant  ascites. Medications reviewed. Labs reviewed; CBGs: 151-236 mg/dL, BUN/creatinine elevated and trending up, Ca: 8.2 mg/dL, GFR: 16.   2/15 - Pt was on Carb-Modified diet but is vegetarian.  - Family was bringing food from home for pt.  - Changed to vegetarian diet.  - She was extubated earlier this week. - Pt hasn't been eating much during stay.  - Family brings small bowls of rice & lentils when her trays were inappropriate.  - Pt was not eating much prior to admission r/t to cancer, overall pain, poor appetite. - Pt remains on CRRT with pressor support. - Palliative care meeting yesterday, family has not come to a conclusion on her goals of care at this time. - Another palliative follow up is to take place today.  Diet Order:  Diet vegetarian Room service appropriate?: Yes; Fluid consistency:: Thin  Skin:  Wound (see comment) (Abdominal incision)  Last BM:  2/16  Height:   Ht Readings from Last 1 Encounters:  01/12/16 5\' 2"  (1.575 m)    Weight:   Wt Readings from Last 1 Encounters:  01/24/16 143 lb 15.4 oz (65.3 kg)    Ideal Body Weight:  50 kg (kg)  BMI:  Body mass index is 26.32 kg/(m^2).  Estimated Nutritional Needs:   Kcal:  [id number] (30-35 kg)  Protein:  78-90 grams (1.2-1.4 grams/kg)  Fluid:  per nephrology recommendation for pt off CRRT  EDUCATION NEEDS:   No education needs identified at this time     Brittany Schwartz, RD, LDN Inpatient Clinical Dietitian Pager # [phone] After hours/weekend pager # [phone]

## 2016-01-24 NOTE — Progress Notes (Signed)
Medical Oncology January 24, 2016, 9:25 AM  Hospital day 14 Antibiotics: none Diflucan, acyclovir DCd with resolution of esophagitis Chemotherapy: first adriamycin 01-10-16 as salvage for recurrent endometrial carcinoma, day 15 cycle 1 today.  (adjuvant chemotherapy for IIIA high grade serous endometrial carcinoma completed 06-16-15)  EMR reviewed Patient seen with daughter here. Reviewed also with Dr Lake Bells at bedside and discussed with CCM PA also on unit.  Subjective: Continues to feel gradually better. Esophagitis symptoms much improved, able to drink liquids and ate a little yesterday. Up in chair x 4 hrs yesterday, PT to begin. Denies SOB. Denies increased discomfort in abdomen. Is passing some flatus, no BM x days, had been using miralax PTA. Some urine in last 24 hrs, foley still in. Has been off pressors for ~ last hour, these prn.  Objective: Vital signs in last 24 hours: Blood pressure 91/53, pulse 89, temperature 98.4 F (36.9 C), temperature source Axillary, resp. rate 19, height _0  (1.575 m), weight 143 lb 8.3 oz (65.1 kg), SpO2 100 %. Awake, alert, appears comfortable supine in bed, respirations not labored Oral mucosa moist and clear. Right IJ lines site ok. PAC site ok. Lungs without wheezes or rales anteriorly. Abdomen minimally more distended, but active BS heard now, not tender, not tight. LE some increased edema lower legs and feet now 1-2+, SCDs on. Feet warm. Moves feet easily. Left hand swelling nearly resolved. Brown urine in foley bag.  Intake/Output from previous day: 02/21 0701 - 02/22 0700 In: 816.7 [P.O.:100; I.V.:559.5; IV Piggyback:157.2] Out: 300 [Urine:300] Intake/Output this shift: Total I/O In: 27.3 [I.V.:27.3] Out: -   Lab Results:  Recent Labs  01/23/16 0500 01/24/16 0540  WBC 6.1 9.2  HGB 8.2* 8.0*  HCT 24.7* 23.9*  PLT 35* 48*   BMET  Recent Labs  01/23/16 0500 01/24/16 0540  NA 136 136  K 4.4 4.0  CL 103 102  CO2 25 26   GLUCOSE 240* 193*  BUN 22* 33*  CREATININE 2.10* 2.95*  CALCIUM 8.2* 8.2*   CA 125 from 01-22-16 273, this having been 326 prior to first adriamycin.  Studies/Results: No results found.   Assessment/Plan: 1. High grade endometrial carcinoma rapidly recurrent over just a few weeks by late Jan, with large volume malignant ascites and bulky metastatic involvement abdomen/ pelvis. First adriamycin given 01-09-16 in palliative attempt.  Malignant ascites has not reaccumulated nearly as quickly either from some response to chemo or CRRT or both; repeat CA 125 some improvement related to the first chemo. Adriamycin would be due again on 2-28 if we try that further (no renal effects from adriamycin). From malignancy standpoint she does not need restaging scans now given this other information. Would not do paracentesis unless ascites increases and is symptomatic. Hopefully would not need peritoneal drain given some improvement in this situation since chemo. I have been in touch with genetics counselors at [facility], will request MSI/ IHC on original path. Genetics counselors are glad to meet with patient's daughters at [facility] as first step, and they will be in touch with daughters to arrange a time (had initially set up for midMarch and I have requested sooner, as any testing would need to be done on patient)  Initial diagnosis was IIIA grade 3 serous/ endometrioid endometrial carcinoma with extensive miliary disease at surgery by gyn oncology 01-19-15, then adjuvant carboplatin taxol thru 06-16-15, followed by vaginal brachytherapy.  2.acute hypoxic respiratory failure 01-11-16, initially requiring ventilator support. Breathing continues stable since extubation.  3.acute  renal failure related to CT contrast, third spacing fluids with malignant ascites, underlying diabetes. CRRT held, some urine output in last 24 hrs. Nephrology following. 4.chemo pancytopenia: past nadir from first adriamycin, no longer  neutropenic, hemoglobin drifting down, platelets improved but still <50K. Hold heparin, SCDs fine.  5.Mucositis: stomatitis and esophagitis better, could have been chemo +/- thrush +/- HSV. OK to stop diflucan and acyclovir from my standpoint (I have DCd, tho if CCM prefers to contnue, that is fine) 6.hypotension: still requiring pressor, tho tolerating some time off.  7.Diabetes covering with insulin 8.Severe protein calorie malnutrition. Patient and family motivated to continue glucerna 9. flu vaccine 01-08-16 10. PAC , dialysis lines 11..Limited code more appropriate than full code in situation of underlying advanced malignancy. If worsens, DNR and comfort care would also be appropriate given underlying situation. Appreciate palliative care team following.  12.deconditioning: patient and family very motivated, glad to try PT    Please page me if I can help between my rounds   [place]

## 2016-01-24 NOTE — Progress Notes (Signed)
MEDICAL ONCOLOGY  Spoke with [facility] pathology, reguesting addition of MSI/ IHC to either surgical path from 01-19-15 [phone]) or from cytology 12-30-15 ([id number]), this testing per my communication with genetics counselors.  Godfrey Pick, MD

## 2016-01-25 DIAGNOSIS — K5901 Slow transit constipation: Secondary | ICD-10-CM

## 2016-01-25 LAB — CBC WITH DIFFERENTIAL/PLATELET
Basophils Absolute: 0 10*3/uL (ref 0.0–0.1)
Basophils Relative: 0 %
Eosinophils Absolute: 0 10*3/uL (ref 0.0–0.7)
Eosinophils Relative: 0 %
HCT: 23.4 % — ABNORMAL LOW (ref 36.0–46.0)
Hemoglobin: 7.7 g/dL — ABNORMAL LOW (ref 12.0–15.0)
Lymphocytes Relative: 7 %
Lymphs Abs: 0.7 10*3/uL (ref 0.7–4.0)
MCH: 29.5 pg (ref 26.0–34.0)
MCHC: 32.9 g/dL (ref 30.0–36.0)
MCV: 89.7 fL (ref 78.0–100.0)
Monocytes Absolute: 1 10*3/uL (ref 0.1–1.0)
Monocytes Relative: 10 %
Neutro Abs: 8.5 10*3/uL — ABNORMAL HIGH (ref 1.7–7.7)
Neutrophils Relative %: 83 %
Platelets: 77 10*3/uL — ABNORMAL LOW (ref 150–400)
RBC: 2.61 MIL/uL — ABNORMAL LOW (ref 3.87–5.11)
RDW: 15.9 % — ABNORMAL HIGH (ref 11.5–15.5)
WBC: 10.2 10*3/uL (ref 4.0–10.5)

## 2016-01-25 LAB — BASIC METABOLIC PANEL
Anion gap: 10 (ref 5–15)
BUN: 36 mg/dL — ABNORMAL HIGH (ref 6–20)
CALCIUM: 8.2 mg/dL — AB (ref 8.9–10.3)
CO2: 24 mmol/L (ref 22–32)
Chloride: 103 mmol/L (ref 101–111)
Creatinine, Ser: 3.36 mg/dL — ABNORMAL HIGH (ref 0.44–1.00)
GFR calc Af Amer: 15 mL/min — ABNORMAL LOW (ref >60)
GFR, EST NON AFRICAN AMERICAN: 13 mL/min — AB (ref >60)
Glucose, Bld: 175 mg/dL — ABNORMAL HIGH (ref 65–99)
Potassium: 4 mmol/L (ref 3.5–5.1)
Sodium: 137 mmol/L (ref 135–145)

## 2016-01-25 LAB — GLUCOSE, CAPILLARY
GLUCOSE-CAPILLARY: 213 mg/dL — AB (ref 65–99)
GLUCOSE-CAPILLARY: 129 mg/dL — AB (ref 65–99)
GLUCOSE-CAPILLARY: 174 mg/dL — AB (ref 65–99)
Glucose-Capillary: 130 mg/dL — ABNORMAL HIGH (ref 65–99)

## 2016-01-25 NOTE — Progress Notes (Signed)
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  Now with rapid recurrent disease since mid January with large volume malignant ascites, bulky involvement pelvis and abdominal caking.  She is followed by Dr. Marko Plume with oncology and is currently undergoing chemo.  Family reports that oncologist feels as though there is hope for improvement with chemotherapy. She received last dose of chemotherapy 2/8. She had 2u PRBC transfusion 2/9 for symptomatic anemia.  During transfusion, she complained of SOB with diaphoresis.  She was subsequently sent to ED for further evaluation.  In ED, she was hypotensive and remained SOB.  She was therefore placed on BiPAP. CXR showed significant pulmonary edema. Laboratory assessment was significant for renal failure with K 7.2, Na 119 (chronically low), and SCr 3.61. She is also hyperglycemic with Glucose 369. Lab eval also notable for WBC 20.2. CT abdomen from a few days prior consistent with progressive mass size with compression of L ureter and L hydronephrosis. She was unable to tolerate BiPAP in ED and required intubation.  PCCM asked to admit.  Extensive discussion with family by Dr. Titus Mould.  Given Mrs. Costley's hx and current circumstances, decision made to limit code status to intubation and vasopressors only.  In the event of cardiac arrest, we would not put her through CPR or defibrillation.  Full medical management otherwise.  STUDIES:  TTE 1/31:  LV EF 65-70%. Normal wall motion. Grade 1 diastolic dysfunction. LA & RA normal in size. RV normal in size & function. PASP 21 mmHg. Normal aortic & mitral  valves. No pericardial effusion. Port CXR 2/11: No opacity. ETT in good position. R IJ CVL in good position.  TTE 2/11:  EF 65-70% w/ normal wall motion. Grade 1 diastolic dysfunction. RV normal in size & function. No effusion.  MICROBIOLOGY: Blood Ctx x2 2/9:  Negative  Urine Ctx 2/9:  Negative  Blood Ctx x2 2/3:  Negative  Urine Ctx 2/2:  Negative   ANTIBIOTICS: Vanc 02/09 >2/19 Zosyn 02/09 >2/19 Acyclovir 2/17 >>>  SIGNIFICANT EVENTS: 02/09 > admitted with acute hypoxic respiratory failure, multiple electrolyte derangements. 02/10 > started CVVHD 02/11 > ureteral stent placed 02/12 > extubated 2/20 > stopped CRRT  LINES/TUBES: R IJ HD cath 02/09 > Foley 2/9 > R Chest Port 1/31 > OETT 7.5 02/09 - 2/12 OGT 2/9 - 2/12  SUBJECTIVE:  Awake and interacting,  Levophed off.  No significant changes o/n  REVIEW OF SYSTEMS:   Esophagitis improving  Passing Flatus, had BM 2/22   VITAL SIGNS: BP 111/60 mmHg  Pulse 83  Temp(Src) 97.6 F (36.4 C) (Axillary)  Resp 19  Ht 5\' 2"  (1.575 m)  Wt 142 lb 13.7 oz (64.8 kg)  BMI 26.12 kg/m2  SpO2 99%  HEMODYNAMICS:    VENTILATOR SETTINGS:    INTAKE / OUTPUT: I/O last 3 completed shifts: In: 392.2 [I.V.:392.2] Out: 545 [id number]  PHYSICAL EXAMINATION: General:  Awake, laying in bed. No acute distress.  Integument:  Warm & dry. No rash on exposed skin.  HEENT:  No scleral injection. Thrush present on tongue. Cardiovascular:  Regular rhythm. Anasarca worsening. Unable to appreciate JVD, +2 bilateral LE pitting edema  Pulmonary: Clear to auscultation bilaterally. Normal work of breathing on room air. Abdomen: Protuberant. Firm. Hypoactive bowel sounds. Mildly uncomfortable w/ palpation.  Neurological:  Awake.  Following commands.  LABS:  BMET  Recent Labs Lab 01/23/16 0500 01/24/16 0540 01/25/16 0535  NA 136 136 137  K 4.4 4.0 4.0  CL 103 102 103  CO2 25 26 24   BUN 22* 33* 36*  CREATININE 2.10* 2.95* 3.36*   GLUCOSE 240* 193* 175*    Electrolytes  Recent Labs Lab 01/20/16 0600  01/21/16 0500 01/21/16 1558 01/22/16 0423 01/23/16 0500 01/24/16 0540 01/25/16 0535  CALCIUM 7.3*  < > 7.9* 7.8* 7.9* 8.2* 8.2* 8.2*  MG 2.3  --  2.4  --  2.5*  --   --   --   PHOS 1.9*  < > 1.8* 3.1 2.6  --   --   --   < > = values in this interval not displayed.  CBC  Recent Labs Lab 01/23/16 0500 01/24/16 0540 01/25/16 0535  WBC 6.1 9.2 10.2  HGB 8.2* 8.0* 7.7*  HCT 24.7* 23.9* 23.4*  PLT 35* 48* 77*    Coag's No results for input(s): APTT, INR in the last 168 hours.  Sepsis Markers No results for input(s): LATICACIDVEN, PROCALCITON, O2SATVEN in the last 168 hours.  ABG No results for input(s): PHART, PCO2ART, PO2ART in the last 168 hours.  Liver Enzymes  Recent Labs Lab 01/21/16 1558 01/22/16 0423 01/23/16 0500  AST  --   --  29  ALT  --   --  19  ALKPHOS  --   --  98  BILITOT  --   --  0.5  ALBUMIN 1.7* 1.9* 1.7*    Cardiac Enzymes No results for input(s): TROPONINI, PROBNP in the last 168 hours.  Glucose  Recent Labs Lab 01/23/16 1606 01/23/16 2206 01/24/16 0815 01/24/16 1346 01/24/16 1657 01/24/16 2134  GLUCAP 180* 236* 213* 152* 238* 143*    Imaging No results found.  ASSESSMENT / PLAN:    Unfortunate 65 year old female with Grade 3 serous/endometriod endometrial carcinoma with recurrence. Suspect patient's pain with swallowing secondary to oral thrush. Prognosis dismal. Patient's pancytopenia improving. Now making urine. Will see if creatinine and renal fxn recovers. Possible transfer to hospital service, patient is medical stable off of pressors and on breathing well.    Recurrent Endometrial Cancer:  Oncology following. Known malignant ascites. Patient s/p fist dose of chemotherapy recently.  Plan Trending cell counts daily with CBC.  Fentanyl IV prn. Next chemo planned for sometime next week if clinically stable   Acute Encephalopathy:   Multifactorial. Fluctuates and likely complicated by delirium.  Plan Balancing comfort from abdominal pain versus worsening mentation with narcotics.  Acute Renal Failure in setting of hypoperfusion and malignant obstructive uropathy. S/p bilateral ureteral stents 2/11.  Just stopped CRRT on 2/20. Wt is down 12 kg after 11 days.  Scr trending up, Weight remains down from previous day Plan Per nephrology  F/u daily creatinine and UOP   Septic Shock (NOS)-->hypotension resolved: Completed Day #10 of 10  of empiric Vancomycin & Zosyn.  Plan Allow for even or positive balance  Added low dose midodrine   Elevated Troponin I:  Likely demand ischemia in setting of shock. No wall motion abnormalities on TTE to suggest MI. Plan D/c tele  Possible Adrenal Insufficiency:  Discontinued stress dose steroids after 5 days on 2/13. Cortisol 2/22 27.9 Plan No supplementation indicated   Oral thrush Plan:  cont nystatin swish and swallow 4 times a day.  Cough: may be related to thrush can't r/o aspiration Plan Cont thin liq diet   Malignant Ascites:  Unable to perform paracentesis either by Dr Ashok Cordia or IR. Plan Monitor will repeat US when symptom burden dictates  Pancytopenia:  Improving. No signs of active bleeding. Likely secondary to chemotherapy.  Plan Trending cell counts daily.  Neutropenic precautions.  Would transfuse plt to keep > 10k even in absence bleeding   H/O DM:  BG controlled.  Plan Low dose SSI algorithm & accu-checks to qAC & HS.  Acute Hypoxic Respiratory Failure:  Resolved. Extubated 2/12.   Plan Continuing pulmonary toilette with IS as mental May need therapeutic paracentesis at some point but would like to defer this for as long as possible   Protein calorie malnutrition Plan  Continue diabetic diet. Continuing Glucerna. Nutrition consulted for recommendations.  Physical deconditioning Plan OOB   Disposition:  Family and patient want to continue  current care for now. Efforts being made to get family to [place].  Wrote letter on behalf of her family member for [place] to enter [place] from [place] 2/18. From a cancer stand-point the goal is to palliate and slow progression. The immediate hope is for renal recovery. Family understands this could be a barrier we may not overcome. For now we will focus on maintaining even volume status, mobility and safety w/ swallowing. She is off pressors. Can transfer to medical onc floor.   Family Updates:  Status and plans discussed with family at bedside  Erick Colace ACNP-BC [facility] Pager # [phone] OR # [phone] if no answer

## 2016-01-25 NOTE — Progress Notes (Signed)
[facility] Progress Note   Subjective: 395 cc UOP yesterday. 187 cc in and 395 out.  Weight down slightly, BP's up slightly. Remains off pressors. Creat up 3.3  Filed Vitals:   01/25/16 0300 01/25/16 0400 01/25/16 0500 01/25/16 0600  BP: 99/51 127/56 108/45 111/60  Pulse: 78 80 82 83  Temp:  97.6 F (36.4 C)    TempSrc:  Axillary    Resp: 16 21 17 19   Height:      Weight:   64.8 kg (142 lb 13.7 oz)   SpO2: 100% 100% 100% 99%   Exam: BP 111/60 mmHg  Pulse 83  Temp(Src) 97.6 F (36.4 C) (Axillary)  Resp 19  Ht 5\' 2"  (1.575 m)  Wt 64.8 kg (142 lb 13.7 oz)  BMI 26.12 kg/m2  SpO2 99% Awake today, up in chair Room air No distress, tired No jvd Line left IJ temp HD cath Chest clear  Regular, HR 80's Abd moderate 2-3 +ascites, nontender + bs 2+ diffuse bilat LE pitting edema up to the hips Neuro gen weakness , Ox 3   CXR 2/11 > normal cxr CT chest 2/9 > perihilar edema, bibasilar consolidation L> R ECHO 1/31 > normal LV/ RV  Assessment: 1 AKI  (contrast/ hypotension/ L hydro/ large vol paracentesis x 2) - Off CRRT d#3. Continues to make some urine. Creat up 3.3 today. No sign of resp compromise or uremia.  If uremia develops will d/w patient about her preference of going back onto RRT (intermittent HD or CVVHD depending on clinical condition) or not .  Will cont to follow.  2 Recurrent metastatic endometrial cancer- recurrent, s/p adriamycin 2/8 as salvage for recurrent disease, not a situation of potential cure per oncology.  3 Septic shock- off of abx, on pressors 4 Pancytopenia - plts down, wbc better 5 DM on oral agent 6 Malnutrition/ hypoalbuminemia 7 Anemia 8 Hypotension - midodrine added here 9 Disposition - limited code.   Plan - Check creat daily.   Kelly Splinter MD [facility] pager [phone]    cell [phone] 01/25/2016, 8:38 AM  Weight trending 01/21/16  66.7 kg (147 lb 0.8 oz)    01/20/16 0421 68.8 kg (151 lb 10.8 oz)   01/19/16 0421 72.3 kg (159 lb 6.3 oz)  01/18/16 0421 73 kg (160 lb 15 oz)  01/17/16 0500 76.1 kg (167 lb 12.3 oz)  01/16/16 0500 75.8 kg (167 lb 1.7 oz)  01/15/16 0429 78.3 kg (172 lb 9.9 oz)  01/14/16 0500 73.6 kg (162 lb 4.1 oz)  01/13/16 0400 76.1 kg (167 lb 12.3 oz)  01/12/16 0334 78.6 kg (173 lb 4.5 oz)  01/12/16 0000 78 kg     Inpatient medications: . antiseptic oral rinse  7 mL Mouth Rinse BID  . feeding supplement (GLUCERNA SHAKE)  237 mL Oral TID BM  . insulin aspart  0-15 Units Subcutaneous TID WC  . insulin aspart  0-5 Units Subcutaneous QHS  . latanoprost  1 drop Both Eyes QHS  . midodrine  10 mg Oral TID WC  . nystatin  5 mL Oral QID  . pantoprazole  40 mg Oral Daily  . sodium chloride flush  10-40 mL Intracatheter Q12H  . sucralfate  1 g Oral 4 times per day  . timolol  1 drop Both Eyes BID  Infusions: . norepinephrine (LEVOPHED) Adult infusion Stopped (01/24/16 0832)   Prn medications sodium chloride, fentaNYL (SUBLIMAZE) injection, heparin, lidocaine, LORazepam, phenol, polyethylene glycol, sodium chloride flush  Recent Labs Lab 01/21/16 0500 01/21/16 1558 01/22/16 0423 01/23/16 0500 01/24/16 0540 01/25/16 0535  NA 139 136 136 136 136 137  K 4.4 4.1 4.2 4.4 4.0 4.0  CL 103 101 101 103 102 103  CO2 27 26 26 25 26 24   GLUCOSE 197* 199* 200* 240* 193* 175*  BUN 12 11 11  22* 33* 36*  CREATININE 0.99 0.90 1.10* 2.10* 2.95* 3.36*  CALCIUM 7.9* 7.8* 7.9* 8.2* 8.2* 8.2*  PHOS 1.8* 3.1 2.6  --   --   --     Recent Labs Lab 01/21/16 1558 01/22/16 0423 01/23/16 0500  AST  --   --  29  ALT  --   --  19  ALKPHOS  --   --  98  BILITOT  --   --  0.5  PROT  --   --  5.2*  ALBUMIN 1.7* 1.9* 1.7*    Recent Labs Lab 01/23/16 0500 01/24/16 0540 01/25/16 0535  WBC 6.1 9.2 10.2  NEUTROABS 5.2 7.3 8.5*  HGB 8.2* 8.0* 7.7*  HCT 24.7* 23.9* 23.4*  MCV 89.5 89.5 89.7  PLT 35* 48* 77*

## 2016-01-25 NOTE — Evaluation (Signed)
Physical Therapy Evaluation Patient Details Name: Brittany Schwartz MRN: [id number] DOB: 1951-08-08 Today's Date: 01/25/2016   History of Present Illness  65 year old female with  endometrioid endometrial carcinoma , Now with rapid recurrent disease since mid January with large volume malignant ascites, involvement pelvis and abdominal. She had 2u PRBC transfusion 2/9 for symptomatic anemia. During transfusion, she complained of SOB with diaphoresis. She was subsequently sent to ED for further evaluation and  required intubation 2/9-2/12.  Clinical Impression  Patient  Is very weak but  Did ambulate x 8'.  VS stable. Daughter present and  Agreed  For rehab vs home. Pt admitted with above diagnosis. Pt currently with functional limitations due to the deficits listed below (see PT Problem List).  Pt will benefit from skilled PT to increase their independence and safety with mobility to allow discharge to the venue listed below.       Follow Up Recommendations SNF;Supervision/Assistance - 24 hour (depends on progress per daughter)    Equipment Recommendations  Rolling walker with 5" wheels    Recommendations for Other Services       Precautions / Restrictions Precautions Precautions: Fall Precaution Comments: frail      Mobility  Bed Mobility Overal bed mobility: Needs Assistance Bed Mobility: Rolling;Sidelying to Sit Rolling: Mod assist Sidelying to sit: Mod assist       General bed mobility comments: cues for technique  Transfers Overall transfer level: Needs assistance Equipment used: Rolling walker (2 wheeled) Transfers: Sit to/from Stand Sit to Stand: Mod assist;+2 safety/equipment;+2 physical assistance         General transfer comment: assist to power uup to stand, cues for hand placement  Ambulation/Gait Ambulation/Gait assistance: Mod assist;+2 physical assistance;+2 safety/equipment Ambulation Distance (Feet): 8 Feet Assistive device: Rolling walker (2  wheeled) Gait Pattern/deviations: Step-to pattern;Decreased step length - right;Decreased step length - left;Shuffle Gait velocity: decr.   General Gait Details: decreased to  step with R more than left  Stairs            Wheelchair Mobility    Modified Rankin (Stroke Patients Only)       Balance Overall balance assessment: Needs assistance Sitting-balance support: Bilateral upper extremity supported;Feet supported Sitting balance-Leahy Scale: Fair     Standing balance support: During functional activity;Bilateral upper extremity supported Standing balance-Leahy Scale: Fair                               Pertinent Vitals/Pain Pain Assessment: Faces Faces Pain Scale: Hurts little more Pain Location: abdomen Pain Descriptors / Indicators: Discomfort;Grimacing Pain Intervention(s): Monitored during session    Home Living Family/patient expects to be discharged to:: Private residence Living Arrangements: Spouse/significant other Available Help at Discharge: Family Type of Home: Apartment Home Access: Level entry     Home Layout: One level Home Equipment: None      Prior Function Level of Independence: Independent               Hand Dominance        Extremity/Trunk Assessment   Upper Extremity Assessment: Generalized weakness           Lower Extremity Assessment: Generalized weakness      Cervical / Trunk Assessment: Normal  Communication   Communication: No difficulties (looks tom daughter at times  and she interprets i)  Cognition Arousal/Alertness: Awake/alert Behavior During Therapy: Flat affect Overall Cognitive Status: Within Functional Limits for tasks assessed  General Comments      Exercises General Exercises - Lower Extremity Ankle Circles/Pumps: AROM;Both;10 reps;Seated Long Arc Quad: AAROM;Both;10 reps;Seated Hip ABduction/ADduction: AAROM;Both;10 reps;Seated Hip Flexion/Marching:  AAROM;Both;10 reps;Seated      Assessment/Plan    PT Assessment Patient needs continued PT services  PT Diagnosis Difficulty walking;Generalized weakness;Acute pain   PT Problem List Decreased strength;Decreased range of motion  PT Treatment Interventions DME instruction;Gait training;Functional mobility training;Therapeutic activities;Patient/family education   PT Goals (Current goals can be found in the Care Plan section) Acute Rehab PT Goals Patient Stated Goal: to get stronger, PT Goal Formulation: With patient/family Time For Goal Achievement: 02/08/16 Potential to Achieve Goals: Fair    Frequency Min 3X/week   Barriers to discharge        Co-evaluation               End of Session Equipment Utilized During Treatment: Gait belt Activity Tolerance: Patient limited by fatigue Patient left: in chair;with call bell/phone within reach;with family/visitor present Nurse Communication: Mobility status         Time: [id number] PT Time Calculation (min) (ACUTE ONLY): 21 min   Charges:   PT Evaluation $PT Eval Moderate Complexity: 1 Procedure     PT G CodesClaretha Cooper 01/25/2016, 1:10 PM Tresa Endo PT [phone]

## 2016-01-26 ENCOUNTER — Inpatient Hospital Stay (HOSPITAL_COMMUNITY): Payer: BLUE CROSS/BLUE SHIELD

## 2016-01-26 DIAGNOSIS — G622 Polyneuropathy due to other toxic agents: Secondary | ICD-10-CM

## 2016-01-26 DIAGNOSIS — E46 Unspecified protein-calorie malnutrition: Secondary | ICD-10-CM

## 2016-01-26 DIAGNOSIS — J9600 Acute respiratory failure, unspecified whether with hypoxia or hypercapnia: Secondary | ICD-10-CM

## 2016-01-26 DIAGNOSIS — K1230 Oral mucositis (ulcerative), unspecified: Secondary | ICD-10-CM

## 2016-01-26 DIAGNOSIS — G620 Drug-induced polyneuropathy: Secondary | ICD-10-CM

## 2016-01-26 DIAGNOSIS — R6520 Severe sepsis without septic shock: Secondary | ICD-10-CM

## 2016-01-26 DIAGNOSIS — T451X5A Adverse effect of antineoplastic and immunosuppressive drugs, initial encounter: Secondary | ICD-10-CM

## 2016-01-26 LAB — CBC WITH DIFFERENTIAL/PLATELET
BASOS ABS: 0 10*3/uL (ref 0.0–0.1)
Basophils Relative: 0 %
EOS PCT: 0 %
Eosinophils Absolute: 0 10*3/uL (ref 0.0–0.7)
HCT: 23.5 % — ABNORMAL LOW (ref 36.0–46.0)
HEMOGLOBIN: 7.6 g/dL — AB (ref 12.0–15.0)
LYMPHS ABS: 0.9 10*3/uL (ref 0.7–4.0)
LYMPHS PCT: 8 %
MCH: 28.9 pg (ref 26.0–34.0)
MCHC: 32.3 g/dL (ref 30.0–36.0)
MCV: 89.4 fL (ref 78.0–100.0)
MONOS PCT: 7 %
Monocytes Absolute: 0.8 10*3/uL (ref 0.1–1.0)
NEUTROS ABS: 9.3 10*3/uL — AB (ref 1.7–7.7)
Neutrophils Relative %: 85 %
PLATELETS: 126 10*3/uL — AB (ref 150–400)
RBC: 2.63 MIL/uL — AB (ref 3.87–5.11)
RDW: 15.9 % — ABNORMAL HIGH (ref 11.5–15.5)
WBC: 11 10*3/uL — AB (ref 4.0–10.5)

## 2016-01-26 LAB — BASIC METABOLIC PANEL
ANION GAP: 10 (ref 5–15)
BUN: 36 mg/dL — ABNORMAL HIGH (ref 6–20)
CHLORIDE: 103 mmol/L (ref 101–111)
CO2: 24 mmol/L (ref 22–32)
Calcium: 8.4 mg/dL — ABNORMAL LOW (ref 8.9–10.3)
Creatinine, Ser: 3.8 mg/dL — ABNORMAL HIGH (ref 0.44–1.00)
GFR calc Af Amer: 13 mL/min — ABNORMAL LOW (ref >60)
GFR, EST NON AFRICAN AMERICAN: 12 mL/min — AB (ref >60)
Glucose, Bld: 163 mg/dL — ABNORMAL HIGH (ref 65–99)
POTASSIUM: 3.9 mmol/L (ref 3.5–5.1)
SODIUM: 137 mmol/L (ref 135–145)

## 2016-01-26 LAB — GLUCOSE, CAPILLARY
GLUCOSE-CAPILLARY: 188 mg/dL — AB (ref 65–99)
GLUCOSE-CAPILLARY: 148 mg/dL — AB (ref 65–99)
GLUCOSE-CAPILLARY: 179 mg/dL — AB (ref 65–99)
Glucose-Capillary: 199 mg/dL — ABNORMAL HIGH (ref 65–99)

## 2016-01-26 MED ORDER — STARCH (THICKENING) PO POWD
ORAL | Status: DC | PRN
  Filled 2016-01-26: qty 227

## 2016-01-26 MED ORDER — GUAIFENESIN-DM 100-10 MG/5ML PO SYRP
5.0000 mL | ORAL_SOLUTION | ORAL | Status: DC | PRN
  Administered 2016-01-28: 5 mL via ORAL
  Filled 2016-01-26: qty 10

## 2016-01-26 NOTE — Progress Notes (Signed)
Utilization review completed.  

## 2016-01-26 NOTE — Progress Notes (Signed)
[facility] Progress Note   Subjective: having some SOB and abd fullness  Filed Vitals:   01/25/16 1615 01/25/16 1805 01/25/16 2100 01/26/16 0453  BP: 111/60 131/57 131/59 116/61  Pulse: 81 87 80 82  Temp:  97.4 F (36.3 C) 97.9 F (36.6 C) 97.9 F (36.6 C)  TempSrc:  Oral Oral Oral  Resp: 23 20 20 18   Height:  5\' 1"  (1.549 m)    Weight:  69.174 kg (152 lb 8 oz)    SpO2: 98% 100% 100% 99%   Exam: BP 116/61 mmHg  Pulse 82  Temp(Src) 97.9 F (36.6 C) (Oral)  Resp 18  Ht 5\' 1"  (1.549 m)  Wt 69.174 kg (152 lb 8 oz)  BMI 28.83 kg/m2  SpO2 99% Alert no distress Room air No jvd Line left IJ temp HD cath Chest clear  Regular, HR 80's Abd moderate 2-3 +ascites, nontender + bs 2+ diffuse bilat LE pitting edema up to the hips/ flanks Neuro gen weakness , Ox 3  CT chest 2/9 > perihilar edema, bibasilar consolidation L> R ECHO 1/31 > normal LV/ RV  Assessment: 1 AKI/ATN - UOP gradually improving but creat has not plateau'd yet.  BP's better. No signs of uremia.  Discussed with patient pro's and con's of doing dialysis in setting of advanced cancer. Encouraged her to think about it. Check creatinine in am. If paracentesis done would try to limit to 2-3 L max.  2 Recurrent metastatic endometrial cancer- recurrent, s/p adriamycin 2/8 as salvage for recurrent disease, not a situation of potential cure per oncology 3 Volume excess - wt's are up today 4 DM on oral agent 5 Malnutrition/ hypoalbuminemia 6 Anemia 7 Shock -resolved. On midodrine now. 8 Limited code   Plan - as above  Kelly Splinter MD [facility] pager [phone]    cell [phone] 01/25/2016, 8:38 AM  Weight trending 01/21/16  66.7 kg (147 lb 0.8 oz)    01/20/16 0421 68.8 kg (151 lb 10.8 oz)  01/19/16 0421 72.3 kg (159 lb 6.3 oz)  01/18/16 0421 73 kg (160 lb 15 oz)  01/17/16 0500 76.1 kg (167 lb 12.3 oz)  01/16/16 0500 75.8 kg (167 lb 1.7 oz)  01/15/16 0429 78.3 kg (172 lb 9.9  oz)  01/14/16 0500 73.6 kg (162 lb 4.1 oz)  01/13/16 0400 76.1 kg (167 lb 12.3 oz)  01/12/16 0334 78.6 kg (173 lb 4.5 oz)  01/12/16 0000 78 kg     Inpatient medications: . antiseptic oral rinse  7 mL Mouth Rinse BID  . feeding supplement (GLUCERNA SHAKE)  237 mL Oral TID BM  . insulin aspart  0-15 Units Subcutaneous TID WC  . insulin aspart  0-5 Units Subcutaneous QHS  . latanoprost  1 drop Both Eyes QHS  . midodrine  10 mg Oral TID WC  . nystatin  5 mL Oral QID  . pantoprazole  40 mg Oral Daily  . sodium chloride flush  10-40 mL Intracatheter Q12H  . sucralfate  1 g Oral 4 times per day  . timolol  1 drop Both Eyes BID  Infusions:   Prn medications sodium chloride, fentaNYL (SUBLIMAZE) injection, guaiFENesin-dextromethorphan, heparin, lidocaine, LORazepam, phenol, polyethylene glycol, sodium chloride flush    Recent Labs Lab 01/21/16 0500 01/21/16 1558 01/22/16 0423  01/24/16 0540 01/25/16 0535 01/26/16 0445  NA 139 136 136  < > 136 137 137  K 4.4 4.1 4.2  < > 4.0 4.0 3.9  CL 103 101 101  < >  102 103 103  CO2 27 26 26   < > 26 24 24   GLUCOSE 197* 199* 200*  < > 193* 175* 163*  BUN 12 11 11   < > 33* 36* 36*  CREATININE 0.99 0.90 1.10*  < > 2.95* 3.36* 3.80*  CALCIUM 7.9* 7.8* 7.9*  < > 8.2* 8.2* 8.4*  PHOS 1.8* 3.1 2.6  --   --   --   --   < > = values in this interval not displayed.  Recent Labs Lab 01/21/16 1558 01/22/16 0423 01/23/16 0500  AST  --   --  29  ALT  --   --  19  ALKPHOS  --   --  98  BILITOT  --   --  0.5  PROT  --   --  5.2*  ALBUMIN 1.7* 1.9* 1.7*    Recent Labs Lab 01/24/16 0540 01/25/16 0535 01/26/16 0445  WBC 9.2 10.2 11.0*  NEUTROABS 7.3 8.5* 9.3*  HGB 8.0* 7.7* 7.6*  HCT 23.9* 23.4* 23.5*  MCV 89.5 89.7 89.4  PLT 48* 77* 126*

## 2016-01-26 NOTE — Progress Notes (Signed)
MEDICAL ONCOLOGY January 26, 2016, 12:14 PM  Hospital day 16 Antibiotics:  Chemotherapy: first adriamycin 01-10-16 as salvage for recurrent endometrial carcinoma, day 15 cycle 1 today.  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 I have spoken separately with daughter also.   Subjective: More cough since yesterday, somewhat productive, not more SOB. Throat still sore and seems to have difficulty with thin liquids, would like to try thickening fluids. Abdomen more distended, feels "heavy" Denies nausea. Denies frank pain. Foley and right IJ lines still in. Has not been up to chair since moving to oncology floor.   Objective: Vital signs in last 24 hours: Blood pressure 116/61, pulse 82, temperature 97.9 F (36.6 C), temperature source Oral, resp. rate 18, height 5' 1"  (1.549 m), weight 152 lb 8 oz (69.174 kg), SpO2 99 %. Awake, alert, looks generally weak but not in acute discomfort. Oral mucosa moist and clear, mucous membranes somewhat pale. Respirations not labored supine, no cough during my exam. Able to position on sides with assistance of 1, crackles RLL posteriorly, no wheezes or rales. Heart RRR. PAC site ok. Right IJ lines site not tender. Abdomen more distended that at my last exam, some BS, not tight and not tender to gentle exam. Foley in. LE with pitting edema under SCDs, but not tight, feet warm. Moves feet easily. No overt bleeding and no bruising/ petechiae including under SCDs.  Intake/Output from previous day: 02/23 0701 - 02/24 0700 In: 300 [P.O.:40; I.V.:260] Out: 425 [Urine:425] Intake/Output this shift:      Lab Results:  Recent Labs  01/25/16 0535 01/26/16 0445  WBC 10.2 11.0*  HGB 7.7* 7.6*  HCT 23.4* 23.5*  PLT 77* 126*   BMET  Recent Labs  01/25/16 0535 01/26/16 0445  NA 137 137  K 4.0 3.9  CL 103 103  CO2 24 24  GLUCOSE 175* 163*  BUN 36* 36*   CREATININE 3.36* 3.80*  CALCIUM 8.2* 8.4*    Studies/Results: No results found.   Discussion: Daughter tells me that she and her sister understand that situation has not improved from renal function standpoint other than now making some urine. Dr Jonnie Finner mentioned today that progressive renal failure without dialysis would not be uncomfortable, more "going to sleep". I have told her that I do not see much significant improvement since first chemotherapy (tho some improvement in rate of reaccumulation of malignant ascites and some improvement in marker following that treatment); I believe the malignant ascites likely is increasing some now and patient really is too ill overall to consider further chemotherapy (would be due next week on original schedule).  I have discussed underlying cancer not curable, and support with dialysis will not change this. I did tell her that Dr Jonnie Finner ok with small volume paracentesis for comfort if needed. IF DECISION IS TO GO HOME WITH HOSPICE, PERITONEAL DRAIN  MAY HELP WITH SYMPTOM MANAGEMENT, could do small volumes prn. Daughter tells me that her mother has mentioned wanting to go home; I have said that we do not want to lose window of opportunity to get her home if that is what she wants. Family had previously not wanted to "take away her hope" and had requested nothing negative be said to patient, however daughter states that she is not certain that mother understands information.  Daughter mentions that she plans to return to her home in [place] early next week for "a few weeks".  Assessment/Plan: 1. High grade endometrial carcinoma rapidly recurrent over just a few weeks by late Jan, with large volume malignant ascites and bulky metastatic involvement abdomen/ pelvis. First adriamycin given 01-09-16 in palliative attempt.Tho possibly some slight improvement in reacumulation of ascites (vs control with CRRT) and a little response of CA 125 marker, I do not believe  she is able to attempt further chemo. If patient and family agree, would consider home with Hospice (I spoke with daughter, not with patient, about this option) [place] [facility] counselors will meet with daughters,  MSI/ IHC requested on original path.   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  2.acute hypoxic respiratory failure 01-11-16, initially requiring ventilator support. Crackles RLL and increased cough, port CXR requested. 3.acute renal failure related to CT contrast, third spacing fluids with malignant ascites, underlying diabetes. CRRT stopped, some urine output, creatinine slightly higher. Nephrology following.  4.chemo pancytopenia: past nadir from first adriamycin.  Platelets ok if prefer prophylactic heparin to SCDs. Hemoglobin low with bloody ascites, chemo, renal failure.  5.Mucositis: stomatitis and esophagitis better, could have been chemo +/- thrush +/- HSV. OK to stop diflucan and acyclovir from my standpoint (I have DCd, tho if CCM prefers to contnue, that is fine) 6.hypotension improved, on midodrine 7.Diabetes covering with insulin 8.Protein calorie malnutrition. I have reminded them to have her sit upright when eats or drinks, and asked for trial with thick-it. I would not do swallowing study at this point. 9. flu vaccine 01-08-16 10. PAC , dialysis lines in place. Foley in . 11..Limited code more appropriate than full code in situation of underlying advanced malignancy. If worsens, DNR and comfort care would also be appropriate given underlying situation. Appreciate palliative care team following.   TIme spent 25 min including >50% counseling. Please page me if I can help between my rounds   [place]

## 2016-01-26 NOTE — Progress Notes (Signed)
Progress Note   Jeanina Failing [id number] DOB: 03-Feb-1951 DOA: 01/11/2016 PCP: [name], MD   Brief Narrative:   Brittany Schwartz is an 65 y.o. 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 She is followed by Dr. Marko Plume with oncology for ongoing chemo, last dose given 01/10/16.  She had 2u PRBC transfusion 01/11/16 for symptomatic anemia. During transfusion, she complained of SOB with diaphoresis. She was subsequently sent to ED for further evaluation where she required BiPAP. CXR showed significant pulmonary edema. Labs were significant for ARF with a creatinine of 3.61 and a potassium of 7.2. Sodium was 119. Because of progressive respiratory failure and inability to tolerate BiPAP, she was intubated in the ED. CVVHD was started 01/12/16 and a ureteral stent was placed to/11/17 after CT scan demonstrated a left UVJ mass with hydronephrosis. She was extubated on 01/14/16. [facility] asked to assume care 01/26/16 after an extensive hospital stay.  Assessment/Plan:   Principal Problems:   Acute respiratory failure with hypoxemia ([place]), resolved - Intubated from 01/11/16-01/14/16. - Respiratory status stable postextubation. - Has a cough, add Robitussin DM.    Septic shock, resolved secondary to bilateral lower lobe pneumonia - Completed a 10 day course of empiric vancomycin and Zosyn. - Required pressor support with Levophed, and continues to need midodrine for blood pressure support. - Cultures all negative.   Active Problems:   Possible adrenal insufficiency - Stress dose steroids completed 01/15/16. - Cortisol 27.9 on 01/24/16.    Mucositis/thrush - Continue nystatin, Carafate and viscous lidocaine.    Constipation - Continue MiraLAX. Bowels moved this a.m.     Demand  ischemia/elevated troponin I - 2-D echo done 01/13/16. EF 65-70 percent. Grade 1 diastolic dysfunction. No regional wall motion abnormalities.    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 Rapidly recurrent with bulky metastatic involvement of abdomen/pelvis. - Disease progression despite chemotherapy noted. - Perform paracentesis for significant symptoms.    Type 2 diabetes mellitus without complication (HCC) - Currently being managed with moderate scale SSI. CBGs 129-213.    Chemotherapy-induced peripheral neuropathy [facility])    Physical deconditioning - Physical therapy evaluation performed. SNF recommended.    Antineoplastic chemotherapy induced anemia/pancytopenia - Blood counts stable. - Received 2 units of PRBCs 01/11/16 for symptomatic anemia.    Protein-calorie malnutrition, severe ([place]) - Significant hypoalbuminemia with low protein stores noted. Continue Glucerna shakes. - Dietitian consultation requested.    Acute encephalopathy/Delirium - Multifactorial with ongoing need for narcotic pain medications contributory.     Acute renal failure (HCC)/hyperkalemia - Multifactorial including third spacing/malignant ascites, underlying diabetes, and CT contrast nephropathy and obstruction. - Status post bilateral ureteral stents 01/13/16. - Off CRRT since 01/22/16. - Unfortunately, creatinine continues to rise.    DVT Prophylaxis - SCDs ordered.   Family Communication/Anticipated D/C date and plan/Code Status   Family Communication: Daughter at the bedside. Disposition Plan: Home when stable. Anticipated D/C date:   2-3 days, depending on recovery of renal function. Code Status: Limited CODE BLUE, drugs and CRRT only.   IV Access:    HD catheter placed 01/11/16  Port-A-Cath   Procedures and diagnostic studies:   Ct Abdomen Pelvis Wo Contrast  01/11/2016  CLINICAL DATA:  Advanced endometrial cancer with peritoneal  spread. Recent transfusion for anemia, subsequently developing shortness of  breath. EXAM: CT CHEST, ABDOMEN AND PELVIS WITHOUT CONTRAST TECHNIQUE: Multidetector CT imaging of the chest, abdomen and pelvis was performed following the standard protocol without IV contrast. COMPARISON:  CT abdomen pelvis with contrast 01/08/2016. FINDINGS: CT CHEST FINDINGS Patient has been intubated. Endotracheal tube is near carina and should be withdrawn 3-4 cm. Port-A-Cath tip unchanged proximal RIGHT atrium. New LEFT IJ dual-lumen venous catheter distal SVC. No pneumothorax. Mediastinum/Lymph Nodes: No masses or pathologically enlarged lymph nodes identified on this un-enhanced exam. Lungs/Pleura: BILATERAL pleural effusions, larger on the RIGHT. Suspected LEFT greater than RIGHT lower lobe infiltrates. Perihilar opacities suggesting superimposed pulmonary edema. Musculoskeletal: No chest wall mass or suspicious bone lesions identified. CT ABDOMEN PELVIS FINDINGS Hepatobiliary: No mass visualized on this un-enhanced exam. Pancreas: No mass or inflammatory process identified on this un-enhanced exam. Spleen: Within normal limits in size. Adrenals/Urinary Tract: LEFT hydronephrosis redemonstrated. BILATERAL contrast nephrograms persists from the February 6 scan indicating acute renal failure. Large pelvic mass. Extrinsic mass effect on the bladder with posterior wall invasion and LEFT UVJ mass effect contributes to hydronephrosis. Stomach/Bowel: No evidence of obstruction, inflammatory process, or abnormal fluid collections. Vascular/Lymphatic: No pathologically enlarged lymph nodes. No evidence of abdominal aortic aneurysm. Reproductive: Status post hysterectomy. Widespread omental tumor. Marked worsening of ascites. Other: None. Musculoskeletal:  No suspicious bone lesions identified. IMPRESSION: Marked worsening aeration since priors. Pulmonary edema with superimposed lower lobe infiltrates, LEFT greater than RIGHT. Endotracheal  tube too low.  Withdrawal 3-4 cm. Marked worsening ascites. Pelvic tumor redemonstrated with bladder invasion. Persistent contrast nephrograms with LEFT hydronephrosis. Electronically Signed   By: Staci Righter M.D.   On: 01/11/2016 21:56   Dg Abd 1 View  01/12/2016  CLINICAL DATA:  65 year old female - NG tube advancement. EXAM: ABDOMEN - 1 VIEW COMPARISON:  01/12/2016 FINDINGS: An NG tube is identified with tip overlying the distal stomach. No other significant abnormalities noted. IMPRESSION: NG tube with tip overlying the distal stomach. Electronically Signed   By: Margarette Canada M.D.   On: 01/12/2016 18:32   Ct Chest Wo Contrast  01/11/2016  CLINICAL DATA:  Advanced endometrial cancer with peritoneal spread. Recent transfusion for anemia, subsequently developing shortness of breath. EXAM: CT CHEST, ABDOMEN AND PELVIS WITHOUT CONTRAST TECHNIQUE: Multidetector CT imaging of the chest, abdomen and pelvis was performed following the standard protocol without IV contrast. COMPARISON:  CT abdomen pelvis with contrast 01/08/2016. FINDINGS: CT CHEST FINDINGS Patient has been intubated. Endotracheal tube is near carina and should be withdrawn 3-4 cm. Port-A-Cath tip unchanged proximal RIGHT atrium. New LEFT IJ dual-lumen venous catheter distal SVC. No pneumothorax. Mediastinum/Lymph Nodes: No masses or pathologically enlarged lymph nodes identified on this un-enhanced exam. Lungs/Pleura: BILATERAL pleural effusions, larger on the RIGHT. Suspected LEFT greater than RIGHT lower lobe infiltrates. Perihilar opacities suggesting superimposed pulmonary edema. Musculoskeletal: No chest wall mass or suspicious bone lesions identified. CT ABDOMEN PELVIS FINDINGS Hepatobiliary: No mass visualized on this un-enhanced exam. Pancreas: No mass or inflammatory process identified on this un-enhanced exam. Spleen: Within normal limits in size. Adrenals/Urinary Tract: LEFT hydronephrosis redemonstrated. BILATERAL contrast nephrograms  persists from the February 6 scan indicating acute renal failure. Large pelvic mass. Extrinsic mass effect on the bladder with posterior wall invasion and LEFT UVJ mass effect contributes to hydronephrosis. Stomach/Bowel: No evidence of obstruction, inflammatory process, or abnormal fluid collections. Vascular/Lymphatic: No pathologically enlarged lymph nodes. No evidence of abdominal aortic aneurysm. Reproductive: Status post hysterectomy. Widespread omental tumor. Marked worsening of ascites. Other: None. Musculoskeletal:  No  suspicious bone lesions identified. IMPRESSION: Marked worsening aeration since priors. Pulmonary edema with superimposed lower lobe infiltrates, LEFT greater than RIGHT. Endotracheal tube too low.  Withdrawal 3-4 cm. Marked worsening ascites. Pelvic tumor redemonstrated with bladder invasion. Persistent contrast nephrograms with LEFT hydronephrosis. Electronically Signed   By: Staci Righter M.D.   On: 01/11/2016 21:56   Dg Chest Port 1 View  01/12/2016  CLINICAL DATA:  65 year old female with respiratory failure. Metastatic endometrial cancer. Shortness of breath, intubated. Initial encounter. EXAM: PORTABLE CHEST 1 VIEW COMPARISON:  CT chest abdomen and pelvis 01/11/2016 and earlier. FINDINGS: Portable AP semi upright view at 0454 hours. Endotracheal tube tip now projects in good position just below the clavicles. Right chest porta cath remains in place. Left IJ approach dual lumen dialysis type catheter at the lower SVC level. Normal cardiac size and mediastinal contours. No pneumothorax or pulmonary edema. Confluent bilateral lower lobe consolidation is less apparent than on yesterday CT. Small right pleural effusion on that exam also is not evident today. No areas of worsening ventilation. IMPRESSION: 1. Endotracheal tube tip in good position at the level the clavicles. 2.  Otherwise, stable lines and tubes. 3. No new cardiopulmonary finding. Bilateral lower lobe consolidation  compatible with pneumonia. Small right pleural effusion seen yesterday not evident radiographically. Electronically Signed   By: Genevie Ann M.D.   On: 01/12/2016 06:57   Dg Chest Port 1 View  01/11/2016  CLINICAL DATA:  Status post intubation and central line placement EXAM: PORTABLE CHEST 1 VIEW COMPARISON:  January 11, 2016 FINDINGS: Endotracheal tube is identified with distal tip 1.4 cm from carina. Retraction by 2.5 cm is recommended. Dual lumen left jugular central venous line are identified with distal tip in the superior vena cava. Right central venous line is identified with distal tip in the superior vena cava unchanged. There is pulmonary edema. There is no pleural effusion or focal pneumonia. There is no pneumothorax. The osseous structures are stable. IMPRESSION: Endotracheal tube distal tip 1.5 cm from carina. Retraction by 2.5 cm is recommended. Left central venous line distal tips in the superior vena cava. No pneumothorax. Pulmonary edema. These results will be called to the ordering clinician or representative by the Radiologist Assistant, and communication documented in the PACS or zVision Dashboard. Electronically Signed   By: Abelardo Diesel M.D.   On: 01/11/2016 21:13   Dg Chest Portable 1 View  01/11/2016  CLINICAL DATA:  History of uterine cancer with abdominal ascites. Patient became hypertensive and diaphoretic during blood transfusion. EXAM: PORTABLE CHEST 1 VIEW COMPARISON:  CT of the chest 01/13/2015 FINDINGS: Right internal jugular approach injectable port terminates within the expected location of superior vena cava. Cardiomediastinal silhouette is normal. Mediastinal contours appear intact. There is no evidence of pneumothorax. Lung volumes are low. There is bilateral interstitial thickening which may be seen with pulmonary vascular congestion. More confluent left lower lobe airspace consolidation cannot be excluded. Osseous structures are without acute abnormality. Soft tissues are  grossly normal. IMPRESSION: Low lung volumes with bilateral interstitial thickening, likely representing pulmonary vascular congestion. More confluent left lower lobe airspace consolidation cannot be excluded. Electronically Signed   By: Fidela Salisbury M.D.   On: 01/11/2016 16:51   Dg Abd Portable 1v  01/12/2016  CLINICAL DATA:  NG tube placement. EXAM: PORTABLE ABDOMEN - 1 VIEW COMPARISON:  CT of the abdomen pelvis 01/10/2014 FINDINGS: Enteric catheter tip is seen overlying the mid upper abdomen, location is uncertain as no gastric bubble is  seen, however it overlies the expected location of the proximal stomach. There is nonobstructive bowel gas pattern. Hyperdense renal shadows are seen. Bridging osteophytes of the lumbosacral spine are noted. IMPRESSION: Enteric catheter overlies expected location of the proximal stomach. Its location is uncertain as no gastric bubble is visualized. Nonobstructive bowel gas pattern. Hyperdense appearance of the renal shadows may represent retention of contrast from CT performed 15 hours ago, indicative of renal function impairment. Electronically Signed   By: Fidela Salisbury M.D.   On: 01/12/2016 11:48     Medical Consultants:    Oncology  Pulmonology  Anti-Infectives:    Vancomycin 01/11/16---> 01/21/16  Zosyn 01/11/16---> 01/21/16  Acyclovir 01/19/16---> 01/24/16  Fluconazole 01/19/16---> 01/24/16   Subjective:   Fabian Almendinger has a cough, mostly productive of clear mucous.  Mildly dyspneic.  No complaints of pain.  Bowels moved this a.m.  Coughs with food or fluid intake.  Not eating much.  Objective:    Filed Vitals:   01/25/16 1615 01/25/16 1805 01/25/16 2100 01/26/16 0453  BP: 111/60 131/57 131/59 116/61  Pulse: 81 87 80 82  Temp:  97.4 F (36.3 C) 97.9 F (36.6 C) 97.9 F (36.6 C)  TempSrc:  Oral Oral Oral  Resp: 23 20 20 18   Height:  5\' 1"  (1.549 m)    Weight:  69.174 kg (152 lb 8 oz)    SpO2: 98% 100% 100% 99%     Intake/Output Summary (Last 24 hours) at 01/26/16 0731 Last data filed at 01/26/16 0509  Gross per 24 hour  Intake    300 ml  Output    425 ml  Net   -125 ml   Filed Weights   01/24/16 1030 01/25/16 0500 01/25/16 1805  Weight: 65.3 kg (143 lb 15.4 oz) 64.8 kg (142 lb 13.7 oz) 69.174 kg (152 lb 8 oz)    Exam: Gen:  NAD Cardiovascular:  RRR, No M/R/G Respiratory:  Lungs diminished Gastrointestinal:  Abdomen firm, distended Extremities:  + Edema   Data Reviewed:    Labs: Basic Metabolic Panel:  Recent Labs Lab 01/20/16 0600 01/20/16 1620 01/21/16 0500 01/21/16 1558 01/22/16 0423 01/23/16 0500 01/24/16 0540 01/25/16 0535 01/26/16 0445  NA 135 134* 139 136 136 136 136 137 137  K 4.9 4.0 4.4 4.1 4.2 4.4 4.0 4.0 3.9  CL 101 100* 103 101 101 103 102 103 103  CO2 27 25 27 26 26 25 26 24 24   GLUCOSE 177* 281* 197* 199* 200* 240* 193* 175* 163*  BUN 12 11 12 11 11  22* 33* 36* 36*  CREATININE 0.94 0.93 0.99 0.90 1.10* 2.10* 2.95* 3.36* 3.80*  CALCIUM 7.3* 7.3* 7.9* 7.8* 7.9* 8.2* 8.2* 8.2* 8.4*  MG 2.3  --  2.4  --  2.5*  --   --   --   --   PHOS 1.9* 1.8* 1.8* 3.1 2.6  --   --   --   --    GFR Estimated Creatinine Clearance: 13.1 mL/min (by C-G formula based on Cr of 3.8). Liver Function Tests:  Recent Labs Lab 01/20/16 1620 01/21/16 0500 01/21/16 1558 01/22/16 0423 01/23/16 0500  AST  --   --   --   --  29  ALT  --   --   --   --  19  ALKPHOS  --   --   --   --  98  BILITOT  --   --   --   --  0.5  PROT  --   --   --   --  5.2*  ALBUMIN 1.5* 1.6* 1.7* 1.9* 1.7*   CBC:  Recent Labs Lab 01/22/16 0423 01/23/16 0500 01/24/16 0540 01/25/16 0535 01/26/16 0445  WBC 3.1* 6.1 9.2 10.2 11.0*  NEUTROABS 2.5 5.2 7.3 8.5* 9.3*  HGB 9.2* 8.2* 8.0* 7.7* 7.6*  HCT 27.9* 24.7* 23.9* 23.4* 23.5*  MCV 88.6 89.5 89.5 89.7 89.4  PLT 36* 35* 48* 77* 126*   CBG:  Recent Labs Lab 01/24/16 2134 01/25/16 0816 01/25/16 1112 01/25/16 1637 01/25/16 2245   GLUCAP 143* 213* 129* 174* 130*   Microbiology No results found for this or any previous visit (from the past 240 hour(s)).   Medications:   . antiseptic oral rinse  7 mL Mouth Rinse BID  . feeding supplement (GLUCERNA SHAKE)  237 mL Oral TID BM  . insulin aspart  0-15 Units Subcutaneous TID WC  . insulin aspart  0-5 Units Subcutaneous QHS  . latanoprost  1 drop Both Eyes QHS  . midodrine  10 mg Oral TID WC  . nystatin  5 mL Oral QID  . pantoprazole  40 mg Oral Daily  . sodium chloride flush  10-40 mL Intracatheter Q12H  . sucralfate  1 g Oral 4 times per day  . timolol  1 drop Both Eyes BID   Continuous Infusions:   Time spent: 35 minutes.  The patient is medically complex with multiple co-morbidities and is at high risk for clinical deterioration and requires high complexity decision making.    LOS: 15 days   [facility] Pager [phone]. If unable to reach me by pager, please call my cell phone at [phone].  *Please refer to [email or web address], password [email or web address] to get updated schedule on who will round on this patient, as hospitalists switch teams weekly. If 7PM-7AM, please contact night-coverage at [email or web address], password [email or web address] for any overnight needs.  01/26/2016, 7:31 AM

## 2016-01-26 NOTE — Progress Notes (Addendum)
Nutrition Follow-up  DOCUMENTATION CODES:   Not applicable  INTERVENTION:  -Discontinue glucerna shakes -Continue Magic cup BID with meals, each supplement provides 290 kcal and 9 grams of protein -RD to continue to monitor for needs   NUTRITION DIAGNOSIS:   Inadequate oral intake related to poor appetite as evidenced by per patient/family report.  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 Now with rapid recurrent disease since mid January with large volume malignant ascites, bulky involvement pelvis and abdominal caking.  Spoke with Ms. Hemstreet & Daughter briefly to check on GS and MC tolerance. Daughter said Ms. Werman still gets full very quickly. She also has been coughing with liquids, but not with solid foods.  SLP saw her on 2/21 with similar sentiments. They expect her ability to swallow to improve as chemo side effects abate and treatment continues. Family has worked around this by using Darby from home and making it into a popsicle. She received magic cup yesterday but did not want it r/t appetite. Daughter stated she will try giving it to her today. Family continues to bring food from home, but daughter admits Ms. Joss isn't eating much.  Clinically, she has been off CRRT since 2/20,  Cr continues to rise from 2.1 - 3.8 over 3 days. Monitor for restarting CRRT.   CBGs: 129-188, Electrolytes WNL.  Diet Order:  Diet vegetarian Room service appropriate?: Yes; Fluid consistency:: Thin  Skin:  Wound (see comment) (Abdominal incision)  Last BM:  2/16  Height:   Ht Readings from Last 1 Encounters:  01/25/16 5\' 1"  (1.549 m)     Weight:   Wt Readings from Last 1 Encounters:  01/25/16 152 lb 8 oz (69.174 kg)    Ideal Body Weight:  50 kg (kg)  BMI:  Body mass index is 28.83 kg/(m^2).  Estimated Nutritional Needs:   Kcal:  [id number] (30-35 kg)  Protein:  78-90 grams (1.2-1.4 grams/kg)  Fluid:  per nephrology recommendation for pt off CRRT  EDUCATION NEEDS:   No education needs identified at this time  Satira Anis. Sailor Hevia, MS, RD LDN After Hours/Weekend Pager [phone]

## 2016-01-27 LAB — BASIC METABOLIC PANEL
Anion gap: 11 (ref 5–15)
BUN: 37 mg/dL — AB (ref 6–20)
CO2: 24 mmol/L (ref 22–32)
CREATININE: 3.67 mg/dL — AB (ref 0.44–1.00)
Calcium: 8.6 mg/dL — ABNORMAL LOW (ref 8.9–10.3)
Chloride: 106 mmol/L (ref 101–111)
GFR calc Af Amer: 14 mL/min — ABNORMAL LOW (ref >60)
GFR calc non Af Amer: 12 mL/min — ABNORMAL LOW (ref >60)
Glucose, Bld: 206 mg/dL — ABNORMAL HIGH (ref 65–99)
Potassium: 4 mmol/L (ref 3.5–5.1)
Sodium: 141 mmol/L (ref 135–145)

## 2016-01-27 LAB — CBC WITH DIFFERENTIAL/PLATELET
BASOS ABS: 0 10*3/uL (ref 0.0–0.1)
Basophils Relative: 0 %
EOS PCT: 0 %
Eosinophils Absolute: 0 10*3/uL (ref 0.0–0.7)
HEMATOCRIT: 24.6 % — AB (ref 36.0–46.0)
HEMOGLOBIN: 7.9 g/dL — AB (ref 12.0–15.0)
LYMPHS ABS: 1 10*3/uL (ref 0.7–4.0)
LYMPHS PCT: 8 %
MCH: 28.9 pg (ref 26.0–34.0)
MCHC: 32.1 g/dL (ref 30.0–36.0)
MCV: 90.1 fL (ref 78.0–100.0)
MONOS PCT: 7 %
Monocytes Absolute: 0.9 10*3/uL (ref 0.1–1.0)
NEUTROS PCT: 85 %
Neutro Abs: 10.7 10*3/uL — ABNORMAL HIGH (ref 1.7–7.7)
Platelets: 214 10*3/uL (ref 150–400)
RBC: 2.73 MIL/uL — AB (ref 3.87–5.11)
RDW: 16 % — ABNORMAL HIGH (ref 11.5–15.5)
WBC: 12.6 10*3/uL — AB (ref 4.0–10.5)

## 2016-01-27 LAB — GLUCOSE, CAPILLARY
GLUCOSE-CAPILLARY: 128 mg/dL — AB (ref 65–99)
Glucose-Capillary: 178 mg/dL — ABNORMAL HIGH (ref 65–99)
Glucose-Capillary: 135 mg/dL — ABNORMAL HIGH (ref 65–99)
Glucose-Capillary: 191 mg/dL — ABNORMAL HIGH (ref 65–99)

## 2016-01-27 NOTE — Progress Notes (Signed)
[facility] Progress Note   Subjective: CXR clear, no edema or vasc congestion.  Creat down slightly.  Wt down slightly.   Filed Vitals:   01/26/16 2125 01/27/16 0612 01/27/16 1550 01/27/16 2057  BP: 113/64 124/71 126/64 117/63  Pulse: 94 88 96 86  Temp: 98.1 F (36.7 C) 98.1 F (36.7 C) 98.2 F (36.8 C) 98.2 F (36.8 C)  TempSrc: Oral Oral Oral Axillary  Resp: 18 18 18 18   Height:      Weight:  68.493 kg (151 lb)    SpO2: 98% 100% 100% 96%   Exam: BP 117/63 mmHg  Pulse 86  Temp(Src) 98.2 F (36.8 C) (Axillary)  Resp 18  Ht 5\' 1"  (1.549 m)  Wt 68.493 kg (151 lb)  BMI 28.55 kg/m2  SpO2 96% Alert no distress Room air No jvd Line left IJ temp HD cath Chest clear  Regular, HR 80's Abd moderate 2 +ascites, nontender + bs 2+ diffuse bilat LE pitting edema up to the hips/ flanks Neuro gen weakness , Ox 3  CT chest 2/9 > perihilar edema, bibasilar consolidation L> R ECHO 1/31 > normal LV/ RV  Assessment: 1 AKI/ATN - creat down today.  BP's good, UOP <500 cc.  Hopefully will cont to improve.  CXR clear, 3rd spaced fluid excess related to cancer.  2 Recurrent metastatic endometrial cancer- recurrent, s/p adriamycin 2/8 as salvage for recurrent disease 3 Volume excess - stable wt 4 DM on oral agent 5 Malnutrition/ hypoalbuminemia 6 Anemia 7 Shock -resolved, on midodrine 8 Limited code   Plan - as above  Kelly Splinter MD [facility] pager [phone]    cell [phone] 01/25/2016, 8:38 AM  Weight trending 01/21/16  66.7 kg (147 lb 0.8 oz)    01/20/16 0421 68.8 kg (151 lb 10.8 oz)  01/19/16 0421 72.3 kg (159 lb 6.3 oz)  01/18/16 0421 73 kg (160 lb 15 oz)  01/17/16 0500 76.1 kg (167 lb 12.3 oz)  01/16/16 0500 75.8 kg (167 lb 1.7 oz)  01/15/16 0429 78.3 kg (172 lb 9.9 oz)  01/14/16 0500 73.6 kg (162 lb 4.1 oz)  01/13/16 0400 76.1 kg (167 lb 12.3 oz)  01/12/16 0334 78.6 kg (173 lb 4.5 oz)  01/12/16 0000 78 kg     Inpatient  medications: . antiseptic oral rinse  7 mL Mouth Rinse BID  . feeding supplement (GLUCERNA SHAKE)  237 mL Oral TID BM  . insulin aspart  0-15 Units Subcutaneous TID WC  . insulin aspart  0-5 Units Subcutaneous QHS  . latanoprost  1 drop Both Eyes QHS  . midodrine  10 mg Oral TID WC  . nystatin  5 mL Oral QID  . pantoprazole  40 mg Oral Daily  . sodium chloride flush  10-40 mL Intracatheter Q12H  . sucralfate  1 g Oral 4 times per day  . timolol  1 drop Both Eyes BID  Infusions:   Prn medications sodium chloride, fentaNYL (SUBLIMAZE) injection, food thickener, guaiFENesin-dextromethorphan, heparin, lidocaine, LORazepam, phenol, polyethylene glycol, sodium chloride flush    Recent Labs Lab 01/21/16 0500 01/21/16 1558 01/22/16 0423  01/25/16 0535 01/26/16 0445 01/27/16 0653  NA 139 136 136  < > 137 137 141  K 4.4 4.1 4.2  < > 4.0 3.9 4.0  CL 103 101 101  < > 103 103 106  CO2 27 26 26   < > 24 24 24   GLUCOSE 197* 199* 200*  < > 175* 163* 206*  BUN  12 11 11   < > 36* 36* 37*  CREATININE 0.99 0.90 1.10*  < > 3.36* 3.80* 3.67*  CALCIUM 7.9* 7.8* 7.9*  < > 8.2* 8.4* 8.6*  PHOS 1.8* 3.1 2.6  --   --   --   --   < > = values in this interval not displayed.  Recent Labs Lab 01/21/16 1558 01/22/16 0423 01/23/16 0500  AST  --   --  29  ALT  --   --  19  ALKPHOS  --   --  98  BILITOT  --   --  0.5  PROT  --   --  5.2*  ALBUMIN 1.7* 1.9* 1.7*    Recent Labs Lab 01/25/16 0535 01/26/16 0445 01/27/16 0653  WBC 10.2 11.0* 12.6*  NEUTROABS 8.5* 9.3* 10.7*  HGB 7.7* 7.6* 7.9*  HCT 23.4* 23.5* 24.6*  MCV 89.7 89.4 90.1  PLT 77* 126* 214

## 2016-01-27 NOTE — Progress Notes (Signed)
Progress Note   Brittany Schwartz DOB: July 17, 1951 DOA: 01/11/2016 PCP: [name], MD   Brief Narrative:   Brittany Schwartz is an 65 y.o. 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 She is followed by Dr. Marko Plume with oncology for ongoing chemo, last dose given 01/10/16.  She had 2u PRBC transfusion 01/11/16 for symptomatic anemia. During transfusion, she complained of SOB with diaphoresis. She was subsequently sent to ED for further evaluation where she required BiPAP. CXR showed significant pulmonary edema. Labs were significant for ARF with a creatinine of 3.61 and a potassium of 7.2. Sodium was 119. Because of progressive respiratory failure and inability to tolerate BiPAP, she was intubated in the ED. CVVHD was started 01/12/16 and a ureteral stent was placed to/11/17 after CT scan demonstrated a left UVJ mass with hydronephrosis. She was extubated on 01/14/16. TRH asked to assume care 01/26/16 after an extensive hospital stay.  Assessment/Plan:   Principal Problems:   Acute respiratory failure with hypoxemia ([place]), resolved - Intubated from 01/11/16-01/14/16. - Respiratory status stable postextubation. - Continue Robitussin DM.    Septic shock, resolved secondary to bilateral lower lobe pneumonia - Completed a 10 day course of empiric vancomycin and Zosyn. - Required pressor support with Levophed, and continues to need midodrine for blood pressure support. - Cultures all negative.   Active Problems:   Possible adrenal insufficiency - Stress dose steroids completed 01/15/16. - Cortisol 27.9 on 01/24/16.    Mucositis/thrush - Continue nystatin, Carafate and viscous lidocaine.    Constipation - Continue MiraLAX. Bowels moving.     Demand ischemia/elevated  troponin I - 2-D echo done 01/13/16. EF 65-70 percent. Grade 1 diastolic dysfunction. No regional wall motion abnormalities.    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 Rapidly recurrent with bulky metastatic involvement of abdomen/pelvis. - Disease progression despite chemotherapy noted. - Perform paracentesis for significant symptoms.    Type 2 diabetes mellitus without complication (HCC) - Currently being managed with moderate scale SSI. CBGs 130-199.    Chemotherapy-induced peripheral neuropathy [facility])    Physical deconditioning - Physical therapy evaluation performed. SNF recommended.    Antineoplastic chemotherapy induced anemia/pancytopenia - Blood counts stable. - Received 2 units of PRBCs 01/11/16 for symptomatic anemia.    Protein-calorie malnutrition, severe ([place]) - Significant hypoalbuminemia with low protein stores noted. Continue Magic cup twice a day per dietitian recommendations.    Acute encephalopathy/Delirium, resolved - Multifactorial with ongoing need for narcotic pain medications contributory. - Largely resolved.     Acute renal failure (HCC)/hyperkalemia - Multifactorial including third spacing/malignant ascites, underlying diabetes, and CT contrast nephropathy and obstruction. - Status post bilateral ureteral stents 01/13/16. - Off CRRT since 01/22/16. - Creatinine beginning to trend down, 3.8---> 3.67.    DVT Prophylaxis - SCDs ordered.   Family Communication/Anticipated D/C date and plan/Code Status   Family Communication: Daughter on telephone, sister, brother-in-law at bedside. Disposition Plan/date: Home when renal function recovers, likely another 1-2 days. Code Status: Limited CODE BLUE, drugs and CRRT only.   IV Access:    HD catheter placed 01/11/16  Port-A-Cath   Procedures and diagnostic studies:   Ct Abdomen Pelvis Wo Contrast  01/11/2016  CLINICAL DATA:  Advanced endometrial cancer  with peritoneal spread. Recent transfusion for anemia, subsequently developing shortness of breath.  EXAM: CT CHEST, ABDOMEN AND PELVIS WITHOUT CONTRAST TECHNIQUE: Multidetector CT imaging of the chest, abdomen and pelvis was performed following the standard protocol without IV contrast. COMPARISON:  CT abdomen pelvis with contrast 01/08/2016. FINDINGS: CT CHEST FINDINGS Patient has been intubated. Endotracheal tube is near carina and should be withdrawn 3-4 cm. Port-A-Cath tip unchanged proximal RIGHT atrium. New LEFT IJ dual-lumen venous catheter distal SVC. No pneumothorax. Mediastinum/Lymph Nodes: No masses or pathologically enlarged lymph nodes identified on this un-enhanced exam. Lungs/Pleura: BILATERAL pleural effusions, larger on the RIGHT. Suspected LEFT greater than RIGHT lower lobe infiltrates. Perihilar opacities suggesting superimposed pulmonary edema. Musculoskeletal: No chest wall mass or suspicious bone lesions identified. CT ABDOMEN PELVIS FINDINGS Hepatobiliary: No mass visualized on this un-enhanced exam. Pancreas: No mass or inflammatory process identified on this un-enhanced exam. Spleen: Within normal limits in size. Adrenals/Urinary Tract: LEFT hydronephrosis redemonstrated. BILATERAL contrast nephrograms persists from the February 6 scan indicating acute renal failure. Large pelvic mass. Extrinsic mass effect on the bladder with posterior wall invasion and LEFT UVJ mass effect contributes to hydronephrosis. Stomach/Bowel: No evidence of obstruction, inflammatory process, or abnormal fluid collections. Vascular/Lymphatic: No pathologically enlarged lymph nodes. No evidence of abdominal aortic aneurysm. Reproductive: Status post hysterectomy. Widespread omental tumor. Marked worsening of ascites. Other: None. Musculoskeletal:  No suspicious bone lesions identified. IMPRESSION: Marked worsening aeration since priors. Pulmonary edema with superimposed lower lobe infiltrates, LEFT greater than  RIGHT. Endotracheal tube too low.  Withdrawal 3-4 cm. Marked worsening ascites. Pelvic tumor redemonstrated with bladder invasion. Persistent contrast nephrograms with LEFT hydronephrosis. Electronically Signed   By: Staci Righter M.D.   On: 01/11/2016 21:56   Dg Abd 1 View  01/12/2016  CLINICAL DATA:  65 year old female - NG tube advancement. EXAM: ABDOMEN - 1 VIEW COMPARISON:  01/12/2016 FINDINGS: An NG tube is identified with tip overlying the distal stomach. No other significant abnormalities noted. IMPRESSION: NG tube with tip overlying the distal stomach. Electronically Signed   By: Margarette Canada M.D.   On: 01/12/2016 18:32   Ct Chest Wo Contrast  01/11/2016  CLINICAL DATA:  Advanced endometrial cancer with peritoneal spread. Recent transfusion for anemia, subsequently developing shortness of breath. EXAM: CT CHEST, ABDOMEN AND PELVIS WITHOUT CONTRAST TECHNIQUE: Multidetector CT imaging of the chest, abdomen and pelvis was performed following the standard protocol without IV contrast. COMPARISON:  CT abdomen pelvis with contrast 01/08/2016. FINDINGS: CT CHEST FINDINGS Patient has been intubated. Endotracheal tube is near carina and should be withdrawn 3-4 cm. Port-A-Cath tip unchanged proximal RIGHT atrium. New LEFT IJ dual-lumen venous catheter distal SVC. No pneumothorax. Mediastinum/Lymph Nodes: No masses or pathologically enlarged lymph nodes identified on this un-enhanced exam. Lungs/Pleura: BILATERAL pleural effusions, larger on the RIGHT. Suspected LEFT greater than RIGHT lower lobe infiltrates. Perihilar opacities suggesting superimposed pulmonary edema. Musculoskeletal: No chest wall mass or suspicious bone lesions identified. CT ABDOMEN PELVIS FINDINGS Hepatobiliary: No mass visualized on this un-enhanced exam. Pancreas: No mass or inflammatory process identified on this un-enhanced exam. Spleen: Within normal limits in size. Adrenals/Urinary Tract: LEFT hydronephrosis redemonstrated. BILATERAL  contrast nephrograms persists from the February 6 scan indicating acute renal failure. Large pelvic mass. Extrinsic mass effect on the bladder with posterior wall invasion and LEFT UVJ mass effect contributes to hydronephrosis. Stomach/Bowel: No evidence of obstruction, inflammatory process, or abnormal fluid collections. Vascular/Lymphatic: No pathologically enlarged lymph nodes. No evidence of abdominal aortic aneurysm. Reproductive: Status post hysterectomy. Widespread omental tumor. Marked worsening of ascites. Other: None. Musculoskeletal:  No suspicious  bone lesions identified. IMPRESSION: Marked worsening aeration since priors. Pulmonary edema with superimposed lower lobe infiltrates, LEFT greater than RIGHT. Endotracheal tube too low.  Withdrawal 3-4 cm. Marked worsening ascites. Pelvic tumor redemonstrated with bladder invasion. Persistent contrast nephrograms with LEFT hydronephrosis. Electronically Signed   By: Staci Righter M.D.   On: 01/11/2016 21:56   Dg Chest Port 1 View  01/12/2016  CLINICAL DATA:  65 year old female with respiratory failure. Metastatic endometrial cancer. Shortness of breath, intubated. Initial encounter. EXAM: PORTABLE CHEST 1 VIEW COMPARISON:  CT chest abdomen and pelvis 01/11/2016 and earlier. FINDINGS: Portable AP semi upright view at 0454 hours. Endotracheal tube tip now projects in good position just below the clavicles. Right chest porta cath remains in place. Left IJ approach dual lumen dialysis type catheter at the lower SVC level. Normal cardiac size and mediastinal contours. No pneumothorax or pulmonary edema. Confluent bilateral lower lobe consolidation is less apparent than on yesterday CT. Small right pleural effusion on that exam also is not evident today. No areas of worsening ventilation. IMPRESSION: 1. Endotracheal tube tip in good position at the level the clavicles. 2.  Otherwise, stable lines and tubes. 3. No new cardiopulmonary finding. Bilateral lower lobe  consolidation compatible with pneumonia. Small right pleural effusion seen yesterday not evident radiographically. Electronically Signed   By: Genevie Ann M.D.   On: 01/12/2016 06:57   Dg Chest Port 1 View  01/11/2016  CLINICAL DATA:  Status post intubation and central line placement EXAM: PORTABLE CHEST 1 VIEW COMPARISON:  January 11, 2016 FINDINGS: Endotracheal tube is identified with distal tip 1.4 cm from carina. Retraction by 2.5 cm is recommended. Dual lumen left jugular central venous line are identified with distal tip in the superior vena cava. Right central venous line is identified with distal tip in the superior vena cava unchanged. There is pulmonary edema. There is no pleural effusion or focal pneumonia. There is no pneumothorax. The osseous structures are stable. IMPRESSION: Endotracheal tube distal tip 1.5 cm from carina. Retraction by 2.5 cm is recommended. Left central venous line distal tips in the superior vena cava. No pneumothorax. Pulmonary edema. These results will be called to the ordering clinician or representative by the Radiologist Assistant, and communication documented in the PACS or zVision Dashboard. Electronically Signed   By: Abelardo Diesel M.D.   On: 01/11/2016 21:13   Dg Chest Portable 1 View  01/11/2016  CLINICAL DATA:  History of uterine cancer with abdominal ascites. Patient became hypertensive and diaphoretic during blood transfusion. EXAM: PORTABLE CHEST 1 VIEW COMPARISON:  CT of the chest 01/13/2015 FINDINGS: Right internal jugular approach injectable port terminates within the expected location of superior vena cava. Cardiomediastinal silhouette is normal. Mediastinal contours appear intact. There is no evidence of pneumothorax. Lung volumes are low. There is bilateral interstitial thickening which may be seen with pulmonary vascular congestion. More confluent left lower lobe airspace consolidation cannot be excluded. Osseous structures are without acute abnormality. Soft  tissues are grossly normal. IMPRESSION: Low lung volumes with bilateral interstitial thickening, likely representing pulmonary vascular congestion. More confluent left lower lobe airspace consolidation cannot be excluded. Electronically Signed   By: Fidela Salisbury M.D.   On: 01/11/2016 16:51   Dg Abd Portable 1v  01/12/2016  CLINICAL DATA:  NG tube placement. EXAM: PORTABLE ABDOMEN - 1 VIEW COMPARISON:  CT of the abdomen pelvis 01/10/2014 FINDINGS: Enteric catheter tip is seen overlying the mid upper abdomen, location is uncertain as no gastric bubble is seen,  however it overlies the expected location of the proximal stomach. There is nonobstructive bowel gas pattern. Hyperdense renal shadows are seen. Bridging osteophytes of the lumbosacral spine are noted. IMPRESSION: Enteric catheter overlies expected location of the proximal stomach. Its location is uncertain as no gastric bubble is visualized. Nonobstructive bowel gas pattern. Hyperdense appearance of the renal shadows may represent retention of contrast from CT performed 15 hours ago, indicative of renal function impairment. Electronically Signed   By: Fidela Salisbury M.D.   On: 01/12/2016 11:48     Medical Consultants:    Oncology  Pulmonology  Nephrology  Anti-Infectives:    Vancomycin 01/11/16---> 01/21/16  Zosyn 01/11/16---> 01/21/16  Acyclovir 01/19/16---> 01/24/16  Fluconazole 01/19/16---> 01/24/16   Subjective:   Brittany Schwartz denies pain, nausea, vomiting, dyspnea today. Her abdomen still feels tight. Appetite okay. Bowels are moving.  Objective:    Filed Vitals:   01/26/16 0453 01/26/16 1430 01/26/16 2125 01/27/16 0612  BP: 116/61 125/59 113/64 124/71  Pulse: 82 79 94 88  Temp: 97.9 F (36.6 C) 98.6 F (37 C) 98.1 F (36.7 C) 98.1 F (36.7 C)  TempSrc: Oral Oral Oral Oral  Resp: 18 18 18 18   Height:      Weight:    68.493 kg (151 lb)  SpO2: 99% 96% 98% 100%    Intake/Output Summary (Last 24 hours) at  01/27/16 0756 Last data filed at 01/27/16 0500  Gross per 24 hour  Intake    360 ml  Output    350 ml  Net     10 ml   Filed Weights   01/25/16 0500 01/25/16 1805 01/27/16 0612  Weight: 64.8 kg (142 lb 13.7 oz) 69.174 kg (152 lb 8 oz) 68.493 kg (151 lb)    Exam: Gen:  NAD Cardiovascular:  RRR, No M/R/G Respiratory:  Lungs diminished Gastrointestinal:  Abdomen firm, distended Extremities:  + Edema   Data Reviewed:    Labs: Basic Metabolic Panel:  Recent Labs Lab 01/20/16 1620 01/21/16 0500 01/21/16 1558 01/22/16 0423 01/23/16 0500 01/24/16 0540 01/25/16 0535 01/26/16 0445 01/27/16 0653  NA 134* 139 136 136 136 136 137 137 141  K 4.0 4.4 4.1 4.2 4.4 4.0 4.0 3.9 4.0  CL 100* 103 101 101 103 102 103 103 106  CO2 25 27 26 26 25 26 24 24 24   GLUCOSE 281* 197* 199* 200* 240* 193* 175* 163* 206*  BUN 11 12 11 11  22* 33* 36* 36* 37*  CREATININE 0.93 0.99 0.90 1.10* 2.10* 2.95* 3.36* 3.80* 3.67*  CALCIUM 7.3* 7.9* 7.8* 7.9* 8.2* 8.2* 8.2* 8.4* 8.6*  MG  --  2.4  --  2.5*  --   --   --   --   --   PHOS 1.8* 1.8* 3.1 2.6  --   --   --   --   --    GFR Estimated Creatinine Clearance: 13.5 mL/min (by C-G formula based on Cr of 3.67). Liver Function Tests:  Recent Labs Lab 01/20/16 1620 01/21/16 0500 01/21/16 1558 01/22/16 0423 01/23/16 0500  AST  --   --   --   --  29  ALT  --   --   --   --  19  ALKPHOS  --   --   --   --  98  BILITOT  --   --   --   --  0.5  PROT  --   --   --   --  5.2*  ALBUMIN 1.5* 1.6* 1.7* 1.9* 1.7*   CBC:  Recent Labs Lab 01/23/16 0500 01/24/16 0540 01/25/16 0535 01/26/16 0445 01/27/16 0653  WBC 6.1 9.2 10.2 11.0* 12.6*  NEUTROABS 5.2 7.3 8.5* 9.3* 10.7*  HGB 8.2* 8.0* 7.7* 7.6* 7.9*  HCT 24.7* 23.9* 23.4* 23.5* 24.6*  MCV 89.5 89.5 89.7 89.4 90.1  PLT 35* 48* 77* 126* 214   CBG:  Recent Labs Lab 01/25/16 2245 01/26/16 0943 01/26/16 1202 01/26/16 1657 01/26/16 2134  GLUCAP 130* 188* 148* 179* 199*    Microbiology No results found for this or any previous visit (from the past 240 hour(s)).   Medications:   . antiseptic oral rinse  7 mL Mouth Rinse BID  . feeding supplement (GLUCERNA SHAKE)  237 mL Oral TID BM  . insulin aspart  0-15 Units Subcutaneous TID WC  . insulin aspart  0-5 Units Subcutaneous QHS  . latanoprost  1 drop Both Eyes QHS  . midodrine  10 mg Oral TID WC  . nystatin  5 mL Oral QID  . pantoprazole  40 mg Oral Daily  . sodium chloride flush  10-40 mL Intracatheter Q12H  . sucralfate  1 g Oral 4 times per day  . timolol  1 drop Both Eyes BID   Continuous Infusions:   Time spent: 25 minutes.    LOS: 16 days   [facility] Pager [phone]. If unable to reach me by pager, please call my cell phone at [phone].  *Please refer to [email or web address], password [email or web address] to get updated schedule on who will round on this patient, as hospitalists switch teams weekly. If 7PM-7AM, please contact night-coverage at [email or web address], password [email or web address] for any overnight needs.  01/27/2016, 7:56 AM

## 2016-01-28 LAB — BASIC METABOLIC PANEL
Anion gap: 12 (ref 5–15)
BUN: 38 mg/dL — ABNORMAL HIGH (ref 6–20)
CALCIUM: 8.5 mg/dL — AB (ref 8.9–10.3)
CO2: 24 mmol/L (ref 22–32)
Chloride: 105 mmol/L (ref 101–111)
Creatinine, Ser: 3.45 mg/dL — ABNORMAL HIGH (ref 0.44–1.00)
GFR, EST AFRICAN AMERICAN: 15 mL/min — AB (ref >60)
GFR, EST NON AFRICAN AMERICAN: 13 mL/min — AB (ref >60)
Glucose, Bld: 187 mg/dL — ABNORMAL HIGH (ref 65–99)
POTASSIUM: 3.8 mmol/L (ref 3.5–5.1)
SODIUM: 141 mmol/L (ref 135–145)

## 2016-01-28 LAB — CBC
HCT: 23.5 % — ABNORMAL LOW (ref 36.0–46.0)
HEMOGLOBIN: 7.7 g/dL — AB (ref 12.0–15.0)
MCH: 29.4 pg (ref 26.0–34.0)
MCHC: 32.8 g/dL (ref 30.0–36.0)
MCV: 89.7 fL (ref 78.0–100.0)
Platelets: 274 10*3/uL (ref 150–400)
RBC: 2.62 MIL/uL — AB (ref 3.87–5.11)
RDW: 15.7 % — ABNORMAL HIGH (ref 11.5–15.5)
WBC: 14.3 10*3/uL — ABNORMAL HIGH (ref 4.0–10.5)

## 2016-01-28 LAB — GLUCOSE, CAPILLARY
GLUCOSE-CAPILLARY: 163 mg/dL — AB (ref 65–99)
GLUCOSE-CAPILLARY: 175 mg/dL — AB (ref 65–99)
Glucose-Capillary: 167 mg/dL — ABNORMAL HIGH (ref 65–99)
Glucose-Capillary: 132 mg/dL — ABNORMAL HIGH (ref 65–99)

## 2016-01-28 NOTE — Progress Notes (Addendum)
[facility] Progress Note   Subjective: Sat in chair 5 hrs yesterday.  Creat down 3.4.  UOP improving, 450 cc yesterday.   Filed Vitals:   01/27/16 0612 01/27/16 1550 01/27/16 2057 01/28/16 0504  BP: 124/71 126/64 117/63 129/67  Pulse: 88 96 86 92  Temp: 98.1 F (36.7 C) 98.2 F (36.8 C) 98.2 F (36.8 C) 98.1 F (36.7 C)  TempSrc: Oral Oral Axillary Axillary  Resp: 18 18 18 18   Height:      Weight: 68.493 kg (151 lb)     SpO2: 100% 100% 96% 97%   Exam: BP 129/67 mmHg  Pulse 92  Temp(Src) 98.1 F (36.7 C) (Axillary)  Resp 18  Ht 5\' 1"  (1.549 m)  Wt 68.493 kg (151 lb)  BMI 28.55 kg/m2  SpO2 97% Alert no distress Room air No jvd Line left IJ temp HD cath Chest clear  Regular, HR 80's Abd moderate 2 +ascites, nontender + bs 2+ diffuse bilat LE pitting edema up to the hips/ flanks Neuro gen weakness , Ox 3  CT chest 2/9 > perihilar edema, bibasilar consolidation L> R ECHO 1/31 > normal LV/ RV  Assessment: 1 AKI/ATN - creat continues to slowly improve.  Off CRRT 5-6 days now.  Will have temp HD cath removed.  Not much to suggest now except avoid nephrotoxins (nsaids/ ACEi/ contrast) and excessive fluids.  Will sign off, please call as needed.  Have d/w patient and daughter.  2 Recurrent metastatic endometrial cancer- recurrent, s/p adriamycin 2/8 as salvage for recurrent disease 3 Volume excess - stable wt 4 DM on oral agent 5 Malnutrition/ hypoalbuminemia 6 Anemia 7 Shock -resolved, on midodrine 8 Limited code   Plan - as above  Kelly Splinter MD [facility] pager [phone]    cell [phone] 01/25/2016, 8:38 AM  Weight trending 01/21/16  66.7 kg (147 lb 0.8 oz)    01/20/16 0421 68.8 kg (151 lb 10.8 oz)  01/19/16 0421 72.3 kg (159 lb 6.3 oz)  01/18/16 0421 73 kg (160 lb 15 oz)  01/17/16 0500 76.1 kg (167 lb 12.3 oz)  01/16/16 0500 75.8 kg (167 lb 1.7 oz)  01/15/16 0429 78.3 kg (172 lb 9.9 oz)  01/14/16 0500 73.6 kg (162 lb  4.1 oz)  01/13/16 0400 76.1 kg (167 lb 12.3 oz)  01/12/16 0334 78.6 kg (173 lb 4.5 oz)  01/12/16 0000 78 kg     Inpatient medications: . antiseptic oral rinse  7 mL Mouth Rinse BID  . feeding supplement (GLUCERNA SHAKE)  237 mL Oral TID BM  . insulin aspart  0-15 Units Subcutaneous TID WC  . insulin aspart  0-5 Units Subcutaneous QHS  . latanoprost  1 drop Both Eyes QHS  . midodrine  10 mg Oral TID WC  . nystatin  5 mL Oral QID  . pantoprazole  40 mg Oral Daily  . sodium chloride flush  10-40 mL Intracatheter Q12H  . sucralfate  1 g Oral 4 times per day  . timolol  1 drop Both Eyes BID  Infusions:   Prn medications sodium chloride, fentaNYL (SUBLIMAZE) injection, food thickener, guaiFENesin-dextromethorphan, heparin, lidocaine, LORazepam, phenol, polyethylene glycol, sodium chloride flush    Recent Labs Lab 01/21/16 1558 01/22/16 0423  01/26/16 0445 01/27/16 0653 01/28/16 0650  NA 136 136  < > 137 141 141  K 4.1 4.2  < > 3.9 4.0 3.8  CL 101 101  < > 103 106 105  CO2 26 26  < >  24 24 24   GLUCOSE 199* 200*  < > 163* 206* 187*  BUN 11 11  < > 36* 37* 38*  CREATININE 0.90 1.10*  < > 3.80* 3.67* 3.45*  CALCIUM 7.8* 7.9*  < > 8.4* 8.6* 8.5*  PHOS 3.1 2.6  --   --   --   --   < > = values in this interval not displayed.  Recent Labs Lab 01/21/16 1558 01/22/16 0423 01/23/16 0500  AST  --   --  29  ALT  --   --  19  ALKPHOS  --   --  98  BILITOT  --   --  0.5  PROT  --   --  5.2*  ALBUMIN 1.7* 1.9* 1.7*    Recent Labs Lab 01/25/16 0535 01/26/16 0445 01/27/16 0653 01/28/16 0650  WBC 10.2 11.0* 12.6* 14.3*  NEUTROABS 8.5* 9.3* 10.7*  --   HGB 7.7* 7.6* 7.9* 7.7*  HCT 23.4* 23.5* 24.6* 23.5*  MCV 89.7 89.4 90.1 89.7  PLT 77* 126* 214 274

## 2016-01-28 NOTE — Progress Notes (Signed)
CSW received consult for SNF placement.  CSW spoke with pt and pt spouse at bedside concerning PT recommendation.  CSW explained SNF process- pt inquired about cost- CSW explained that with there insurance plan there would likely be a $100-150 copay a day starting day one  Pt and spouse do not feel as if this is affordable but aren't sure who they will manage at home if the patient can not walk  Pt and spouse asked that CSW follow up with pt daughter, Kathrine Haddock, regarding plan [phone] tomorrow  They are hopeful that PT will reevaluated pt (last seen 2/23) and then they will have better idea of pt needs.  CSW will continue to follow  Domenica Reamer, [place] Social Worker [phone]

## 2016-01-28 NOTE — Progress Notes (Signed)
Progress Note   Brittany Schwartz [id number] DOB: Aug 02, 1951 DOA: 01/11/2016 PCP: [name], MD   Brief Narrative:   Brittany Schwartz is an 65 y.o. 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 She is followed by Dr. Marko Plume with oncology for ongoing chemo, last dose given 01/10/16.  She had 2u PRBC transfusion 01/11/16 for symptomatic anemia. During transfusion, she complained of SOB with diaphoresis. She was subsequently sent to ED for further evaluation where she required BiPAP. CXR showed significant pulmonary edema. Labs were significant for ARF with a creatinine of 3.61 and a potassium of 7.2. Sodium was 119. Because of progressive respiratory failure and inability to tolerate BiPAP, she was intubated in the ED. CVVHD was started 01/12/16 and a ureteral stent was placed to/11/17 after CT scan demonstrated a left UVJ mass with hydronephrosis. She was extubated on 01/14/16. [facility] asked to assume care 01/26/16 after an extensive hospital stay.  Assessment/Plan:   Principal Problems:   Acute respiratory failure with hypoxemia (Brittany Schwartz), resolved - Intubated from 01/11/16-01/14/16. - Respiratory status stable postextubation. - Continue Robitussin DM.    Septic shock, resolved secondary to bilateral lower lobe pneumonia - Completed a 10 day course of empiric vancomycin and Zosyn. - Required pressor support with Levophed, and continues to need midodrine for blood pressure support. - Cultures all negative.     Acute renal failure (HCC)/hyperkalemia - Multifactorial including third spacing/malignant ascites, underlying diabetes, and CT contrast nephropathy and obstruction. - Status post bilateral ureteral stents 01/13/16. - Off CRRT since 01/22/16. Nephrology to pull dialysis catheter and  sign off. - Creatinine beginning to trend down, 3.8---> 3.67--->3.45.  Active Problems:   Possible adrenal insufficiency - Stress dose steroids completed 01/15/16. - Cortisol 27.9 on 01/24/16.    Mucositis/thrush - Continue nystatin, Carafate and viscous lidocaine.    Constipation - Continue MiraLAX. Bowels moving.     Demand ischemia/elevated troponin I - 2-D echo done 01/13/16. EF 65-70 percent. Grade 1 diastolic dysfunction. No regional wall motion abnormalities.    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 Rapidly recurrent with bulky metastatic involvement of abdomen/pelvis. - Disease progression despite chemotherapy noted. - Perform paracentesis for significant symptoms.    Type 2 diabetes mellitus without complication (HCC) - Currently being managed with moderate scale SSI. CBGs 128-178.    Chemotherapy-induced peripheral neuropathy [facility])    Physical deconditioning - Physical therapy evaluation performed. SNF recommended.    Antineoplastic chemotherapy induced anemia/pancytopenia - Blood counts stable. - Received 2 units of PRBCs 01/11/16 for symptomatic anemia.    Protein-calorie malnutrition, severe ([place]) - Significant hypoalbuminemia with low protein stores noted. Continue Magic cup twice a day per dietitian recommendations.    Acute encephalopathy/Delirium, resolved - Resolved, felt to be multifactorial.    DVT Prophylaxis - SCDs ordered.   Family Communication/Anticipated D/C date and plan/Code Status   Family Communication: Daughter at bedside. Disposition Plan/date: SNF when renal function recovers, likely another 1-2 days. Code Status: Limited CODE BLUE, drugs and CRRT only.   IV Access:    HD catheter placed 01/11/16  Port-A-Cath   Procedures and diagnostic studies:   Ct Abdomen Pelvis Wo Contrast  01/11/2016  CLINICAL DATA:  Advanced endometrial cancer with peritoneal spread. Recent transfusion  for anemia, subsequently developing shortness of breath. EXAM: CT CHEST, ABDOMEN  AND PELVIS WITHOUT CONTRAST TECHNIQUE: Multidetector CT imaging of the chest, abdomen and pelvis was performed following the standard protocol without IV contrast. COMPARISON:  CT abdomen pelvis with contrast 01/08/2016. FINDINGS: CT CHEST FINDINGS Patient has been intubated. Endotracheal tube is near carina and should be withdrawn 3-4 cm. Port-A-Cath tip unchanged proximal RIGHT atrium. New LEFT IJ dual-lumen venous catheter distal SVC. No pneumothorax. Mediastinum/Lymph Nodes: No masses or pathologically enlarged lymph nodes identified on this un-enhanced exam. Lungs/Pleura: BILATERAL pleural effusions, larger on the RIGHT. Suspected LEFT greater than RIGHT lower lobe infiltrates. Perihilar opacities suggesting superimposed pulmonary edema. Musculoskeletal: No chest wall mass or suspicious bone lesions identified. CT ABDOMEN PELVIS FINDINGS Hepatobiliary: No mass visualized on this un-enhanced exam. Pancreas: No mass or inflammatory process identified on this un-enhanced exam. Spleen: Within normal limits in size. Adrenals/Urinary Tract: LEFT hydronephrosis redemonstrated. BILATERAL contrast nephrograms persists from the February 6 scan indicating acute renal failure. Large pelvic mass. Extrinsic mass effect on the bladder with posterior wall invasion and LEFT UVJ mass effect contributes to hydronephrosis. Stomach/Bowel: No evidence of obstruction, inflammatory process, or abnormal fluid collections. Vascular/Lymphatic: No pathologically enlarged lymph nodes. No evidence of abdominal aortic aneurysm. Reproductive: Status post hysterectomy. Widespread omental tumor. Marked worsening of ascites. Other: None. Musculoskeletal:  No suspicious bone lesions identified. IMPRESSION: Marked worsening aeration since priors. Pulmonary edema with superimposed lower lobe infiltrates, LEFT greater than RIGHT. Endotracheal tube too low.  Withdrawal  3-4 cm. Marked worsening ascites. Pelvic tumor redemonstrated with bladder invasion. Persistent contrast nephrograms with LEFT hydronephrosis. Electronically Signed   By: Staci Righter M.D.   On: 01/11/2016 21:56   Dg Abd 1 View  01/12/2016  CLINICAL DATA:  65 year old female - NG tube advancement. EXAM: ABDOMEN - 1 VIEW COMPARISON:  01/12/2016 FINDINGS: An NG tube is identified with tip overlying the distal stomach. No other significant abnormalities noted. IMPRESSION: NG tube with tip overlying the distal stomach. Electronically Signed   By: Margarette Canada M.D.   On: 01/12/2016 18:32   Ct Chest Wo Contrast  01/11/2016  CLINICAL DATA:  Advanced endometrial cancer with peritoneal spread. Recent transfusion for anemia, subsequently developing shortness of breath. EXAM: CT CHEST, ABDOMEN AND PELVIS WITHOUT CONTRAST TECHNIQUE: Multidetector CT imaging of the chest, abdomen and pelvis was performed following the standard protocol without IV contrast. COMPARISON:  CT abdomen pelvis with contrast 01/08/2016. FINDINGS: CT CHEST FINDINGS Patient has been intubated. Endotracheal tube is near carina and should be withdrawn 3-4 cm. Port-A-Cath tip unchanged proximal RIGHT atrium. New LEFT IJ dual-lumen venous catheter distal SVC. No pneumothorax. Mediastinum/Lymph Nodes: No masses or pathologically enlarged lymph nodes identified on this un-enhanced exam. Lungs/Pleura: BILATERAL pleural effusions, larger on the RIGHT. Suspected LEFT greater than RIGHT lower lobe infiltrates. Perihilar opacities suggesting superimposed pulmonary edema. Musculoskeletal: No chest wall mass or suspicious bone lesions identified. CT ABDOMEN PELVIS FINDINGS Hepatobiliary: No mass visualized on this un-enhanced exam. Pancreas: No mass or inflammatory process identified on this un-enhanced exam. Spleen: Within normal limits in size. Adrenals/Urinary Tract: LEFT hydronephrosis redemonstrated. BILATERAL contrast nephrograms persists from the February  6 scan indicating acute renal failure. Large pelvic mass. Extrinsic mass effect on the bladder with posterior wall invasion and LEFT UVJ mass effect contributes to hydronephrosis. Stomach/Bowel: No evidence of obstruction, inflammatory process, or abnormal fluid collections. Vascular/Lymphatic: No pathologically enlarged lymph nodes. No evidence of abdominal aortic aneurysm. Reproductive: Status post hysterectomy. Widespread omental tumor. Marked worsening of ascites. Other: None. Musculoskeletal:  No suspicious bone lesions identified. IMPRESSION:  Marked worsening aeration since priors. Pulmonary edema with superimposed lower lobe infiltrates, LEFT greater than RIGHT. Endotracheal tube too low.  Withdrawal 3-4 cm. Marked worsening ascites. Pelvic tumor redemonstrated with bladder invasion. Persistent contrast nephrograms with LEFT hydronephrosis. Electronically Signed   By: Staci Righter M.D.   On: 01/11/2016 21:56   Dg Chest Port 1 View  01/12/2016  CLINICAL DATA:  65 year old female with respiratory failure. Metastatic endometrial cancer. Shortness of breath, intubated. Initial encounter. EXAM: PORTABLE CHEST 1 VIEW COMPARISON:  CT chest abdomen and pelvis 01/11/2016 and earlier. FINDINGS: Portable AP semi upright view at 0454 hours. Endotracheal tube tip now projects in good position just below the clavicles. Right chest porta cath remains in place. Left IJ approach dual lumen dialysis type catheter at the lower SVC level. Normal cardiac size and mediastinal contours. No pneumothorax or pulmonary edema. Confluent bilateral lower lobe consolidation is less apparent than on yesterday CT. Small right pleural effusion on that exam also is not evident today. No areas of worsening ventilation. IMPRESSION: 1. Endotracheal tube tip in good position at the level the clavicles. 2.  Otherwise, stable lines and tubes. 3. No new cardiopulmonary finding. Bilateral lower lobe consolidation compatible with pneumonia. Small  right pleural effusion seen yesterday not evident radiographically. Electronically Signed   By: Genevie Ann M.D.   On: 01/12/2016 06:57   Dg Chest Port 1 View  01/11/2016  CLINICAL DATA:  Status post intubation and central line placement EXAM: PORTABLE CHEST 1 VIEW COMPARISON:  January 11, 2016 FINDINGS: Endotracheal tube is identified with distal tip 1.4 cm from carina. Retraction by 2.5 cm is recommended. Dual lumen left jugular central venous line are identified with distal tip in the superior vena cava. Right central venous line is identified with distal tip in the superior vena cava unchanged. There is pulmonary edema. There is no pleural effusion or focal pneumonia. There is no pneumothorax. The osseous structures are stable. IMPRESSION: Endotracheal tube distal tip 1.5 cm from carina. Retraction by 2.5 cm is recommended. Left central venous line distal tips in the superior vena cava. No pneumothorax. Pulmonary edema. These results will be called to the ordering clinician or representative by the Radiologist Assistant, and communication documented in the PACS or zVision Dashboard. Electronically Signed   By: Abelardo Diesel M.D.   On: 01/11/2016 21:13   Dg Chest Portable 1 View  01/11/2016  CLINICAL DATA:  History of uterine cancer with abdominal ascites. Patient became hypertensive and diaphoretic during blood transfusion. EXAM: PORTABLE CHEST 1 VIEW COMPARISON:  CT of the chest 01/13/2015 FINDINGS: Right internal jugular approach injectable port terminates within the expected location of superior vena cava. Cardiomediastinal silhouette is normal. Mediastinal contours appear intact. There is no evidence of pneumothorax. Lung volumes are low. There is bilateral interstitial thickening which may be seen with pulmonary vascular congestion. More confluent left lower lobe airspace consolidation cannot be excluded. Osseous structures are without acute abnormality. Soft tissues are grossly normal. IMPRESSION: Low lung  volumes with bilateral interstitial thickening, likely representing pulmonary vascular congestion. More confluent left lower lobe airspace consolidation cannot be excluded. Electronically Signed   By: Fidela Salisbury M.D.   On: 01/11/2016 16:51   Dg Abd Portable 1v  01/12/2016  CLINICAL DATA:  NG tube placement. EXAM: PORTABLE ABDOMEN - 1 VIEW COMPARISON:  CT of the abdomen pelvis 01/10/2014 FINDINGS: Enteric catheter tip is seen overlying the mid upper abdomen, location is uncertain as no gastric bubble is seen, however it overlies the  expected location of the proximal stomach. There is nonobstructive bowel gas pattern. Hyperdense renal shadows are seen. Bridging osteophytes of the lumbosacral spine are noted. IMPRESSION: Enteric catheter overlies expected location of the proximal stomach. Its location is uncertain as no gastric bubble is visualized. Nonobstructive bowel gas pattern. Hyperdense appearance of the renal shadows may represent retention of contrast from CT performed 15 hours ago, indicative of renal function impairment. Electronically Signed   By: Fidela Salisbury M.D.   On: 01/12/2016 11:48     Medical Consultants:    Oncology  Pulmonology  Nephrology  Anti-Infectives:    Vancomycin 01/11/16---> 01/21/16  Zosyn 01/11/16---> 01/21/16  Acyclovir 01/19/16---> 01/24/16  Fluconazole 01/19/16---> 01/24/16   Subjective:   Brittany Schwartz denies pain, nausea, vomiting, dyspnea today. Her abdomen still feels tight, but says she can tolerate it. Appetite okay, family brings her in foods from home. Bowels are moving.  Objective:    Filed Vitals:   01/27/16 1550 01/27/16 2057 01/28/16 0504 01/28/16 1417  BP: 126/64 117/63 129/67 117/75  Pulse: 96 86 92 99  Temp: 98.2 F (36.8 C) 98.2 F (36.8 C) 98.1 F (36.7 C) 98 F (36.7 C)  TempSrc: Oral Axillary Axillary Oral  Resp: 18 18 18 18   Height:      Weight:      SpO2: 100% 96% 97% 95%    Intake/Output Summary (Last 24  hours) at 01/28/16 1442 Last data filed at 01/28/16 0533  Gross per 24 hour  Intake     40 ml  Output    675 ml  Net   -635 ml   Filed Weights   01/25/16 0500 01/25/16 1805 01/27/16 0612  Weight: 64.8 kg (142 lb 13.7 oz) 69.174 kg (152 lb 8 oz) 68.493 kg (151 lb)    Exam: Gen:  NAD Cardiovascular:  RRR, No M/R/G Respiratory:  Lungs diminished Gastrointestinal:  Abdomen firm, distended Extremities:  + Edema   Data Reviewed:    Labs: Basic Metabolic Panel:  Recent Labs Lab 01/21/16 1558 01/22/16 0423  01/24/16 0540 01/25/16 0535 01/26/16 0445 01/27/16 0653 01/28/16 0650  NA 136 136  < > 136 137 137 141 141  K 4.1 4.2  < > 4.0 4.0 3.9 4.0 3.8  CL 101 101  < > 102 103 103 106 105  CO2 26 26  < > 26 24 24 24 24   GLUCOSE 199* 200*  < > 193* 175* 163* 206* 187*  BUN 11 11  < > 33* 36* 36* 37* 38*  CREATININE 0.90 1.10*  < > 2.95* 3.36* 3.80* 3.67* 3.45*  CALCIUM 7.8* 7.9*  < > 8.2* 8.2* 8.4* 8.6* 8.5*  MG  --  2.5*  --   --   --   --   --   --   PHOS 3.1 2.6  --   --   --   --   --   --   < > = values in this interval not displayed. GFR Estimated Creatinine Clearance: 14.4 mL/min (by C-G formula based on Cr of 3.45). Liver Function Tests:  Recent Labs Lab 01/21/16 1558 01/22/16 0423 01/23/16 0500  AST  --   --  29  ALT  --   --  19  ALKPHOS  --   --  98  BILITOT  --   --  0.5  PROT  --   --  5.2*  ALBUMIN 1.7* 1.9* 1.7*   CBC:  Recent Labs Lab 01/23/16 0500 01/24/16  [id number] 01/25/16 0535 01/26/16 0445 01/27/16 0653 01/28/16 0650  WBC 6.1 9.2 10.2 11.0* 12.6* 14.3*  NEUTROABS 5.2 7.3 8.5* 9.3* 10.7*  --   HGB 8.2* 8.0* 7.7* 7.6* 7.9* 7.7*  HCT 24.7* 23.9* 23.4* 23.5* 24.6* 23.5*  MCV 89.5 89.5 89.7 89.4 90.1 89.7  PLT 35* 48* 77* 126* 214 274   CBG:  Recent Labs Lab 01/27/16 1206 01/27/16 1653 01/27/16 2145 01/28/16 0734 01/28/16 1211  GLUCAP 135* 191* 128* 163* 175*   Microbiology No results found for this or any previous visit (from  the past 240 hour(s)).   Medications:   . antiseptic oral rinse  7 mL Mouth Rinse BID  . feeding supplement (GLUCERNA SHAKE)  237 mL Oral TID BM  . insulin aspart  0-15 Units Subcutaneous TID WC  . insulin aspart  0-5 Units Subcutaneous QHS  . latanoprost  1 drop Both Eyes QHS  . midodrine  10 mg Oral TID WC  . nystatin  5 mL Oral QID  . pantoprazole  40 mg Oral Daily  . sodium chloride flush  10-40 mL Intracatheter Q12H  . sucralfate  1 g Oral 4 times per day  . timolol  1 drop Both Eyes BID   Continuous Infusions:   Time spent: 25 minutes.    LOS: 17 days   [place] [facility] Pager [phone]. If unable to reach me by pager, please call my cell phone at [phone].  *Please refer to [email or web address], password [email or web address] to get updated schedule on who will round on this patient, as hospitalists switch teams weekly. If 7PM-7AM, please contact night-coverage at [email or web address], password [email or web address] for any overnight needs.  01/28/2016, 2:42 PM

## 2016-01-28 NOTE — Clinical Social Work Note (Signed)
Clinical Social Work Assessment  Patient Details  Name: Brittany Schwartz MRN: [id number] Date of Birth: Feb 28, 1951  Date of referral:  01/28/16               Reason for consult:  Facility Placement                Permission sought to share information with:  Facility Sport and exercise psychologist, Family Supports Permission granted to share information::  Yes, Verbal Permission Granted  Name::     Aeronautical engineer::  SNF  Relationship::  daughter  Contact Information:     Housing/Transportation Living arrangements for the past 2 months:  Single Family Home Source of Information:  Patient, Spouse Patient Interpreter Needed:  None Criminal Activity/Legal Involvement Pertinent to Current Situation/Hospitalization:  No - Comment as needed Significant Relationships:  Adult Children, Spouse Lives with:  Spouse Do you feel safe going back to the place where you live?  No Need for family participation in patient care:  Yes (Comment)  Care giving concerns:  Pt can barely move her legs due to tightness/ swelling- does not know how she can return home if she can not walk   Facilities manager / plan:  CSW spoke with pt and pt spouse concerning PT recommendation for SNF- explained referral process and insurance coverage.  Employment status:    Insurance information:  Managed Care PT Recommendations:  [place] / Referral to community resources:  [facility]  Patient/Family's Response to care:  Pt and spouse are agreeable to SNF but do not think it is affordable- interested in learning about home services.   Very worried about if pt can not walk what they will do  Patient/Family's Understanding of and Emotional Response to Diagnosis, Current Treatment, and Prognosis:  No questions or concerns at this time- want PT to reevaluate  Emotional Assessment Appearance:  Appears stated age Attitude/Demeanor/Rapport:    Affect (typically observed):  Accepting,  Appropriate, Pleasant Orientation:  Oriented to Situation, Oriented to  Time, Oriented to Place, Oriented to Self Alcohol / Substance use:  Not Applicable Psych involvement (Current and /or in the community):  No (Comment)  Discharge Needs  Concerns to be addressed:  Care Coordination, Home Safety Concerns Readmission within the last 30 days:  Yes Current discharge risk:  Physical Impairment Barriers to Discharge:  Continued Medical Work up   Cranford Mon, LCSW 01/28/2016, 4:47 PM

## 2016-01-28 NOTE — NC FL2 (Signed)
[place] LEVEL OF CARE SCREENING TOOL     IDENTIFICATION  Patient Name: Brittany Schwartz Birthdate: 18-Jan-1951 Sex: female Admission Date (Current Location): 01/11/2016  [facility] and [place] Number:  Herbalist and Address:  [facility], [place] [address] [place], [place] [address]      Provider Number: [id number]  Attending Physician Name and Address:  [name], MD  Relative Name and Phone Number:       Current Level of Care: Hospital Recommended Level of Care: [place] Prior Approval Number:    Date Approved/Denied:   PASRR Number: [id number]  Discharge Plan: SNF    Current Diagnoses: Patient Active Problem List   Diagnosis Date Noted  . Ovarian cancer ([place])   . Shock circulatory ([place])   . Oral thrush   . Goals of care, counseling/discussion   . Acute respiratory failure with hypoxemia ([place])   . Delirium   . Acute renal failure ([place])   . Septic shock ([place])   . Encounter for palliative care   . Hyperkalemia 01/11/2016  . Dyspnea   . Encounter for intubation   . SOB (shortness of breath)   . Acute respiratory failure with hypoxia ([place])   . Malignant neoplasm of uterus ([place])   . 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 Protein-calorie malnutrition, severe ([place]) 01/09/2016  . Hypoalbuminemia 01/09/2016  . AKI (acute kidney injury) ([place]) 01/04/2016  . Elevated lactic acid level 01/04/2016  . Malignant ascites   . Non-insulin dependent type 2 diabetes mellitus ([place])   . Metastatic cancer ([place]) 12/29/2015  . Hydronephrosis, left 12/29/2015  . Abdominal distension 12/29/2015  . Antineoplastic chemotherapy induced anemia 08/15/2015  . Esophageal reflux 05/06/2015  . Chemotherapy-induced peripheral neuropathy ([place]) 04/14/2015  . Anemia associated with chemotherapy 04/14/2015  . Chemotherapy induced neutropenia ([place]) 03/05/2015  . Absolute anemia 02/24/2015  . Type 2 diabetes  mellitus without complication ([place]) 0000000  . Essential hypertension 02/24/2015  . Glaucoma 02/12/2015  . Anemia 02/12/2015  . Hypochloremia 02/12/2015  . Hyponatremia 02/12/2015  . Dysuria 02/03/2015  . Uterine cancer ([place]) 01/19/2015  . Endometrial cancer ([place]) 01/09/2015  . Hypertension 01/09/2015  . Hypercholesterolemia 01/09/2015    Orientation RESPIRATION BLADDER Height & Weight     Self, Time, Situation, Place  Normal Indwelling catheter Weight: 151 lb (68.493 kg) Height:  5\' 1"  (154.9 cm)  BEHAVIORAL SYMPTOMS/MOOD NEUROLOGICAL BOWEL NUTRITION STATUS      Continent Diet (vegetarian, carb modified)  AMBULATORY STATUS COMMUNICATION OF NEEDS Skin   Extensive Assist Verbally Surgical wounds                       Personal Care Assistance Level of Assistance  Bathing, Dressing Bathing Assistance: Maximum assistance   Dressing Assistance: Maximum assistance     Functional Limitations Info             SPECIAL CARE FACTORS FREQUENCY  PT (By licensed PT), OT (By licensed OT)     PT Frequency: 5/wk OT Frequency: 5/wk            Contractures      Additional Factors Info  Code Status, Allergies, Insulin Sliding Scale Code Status Info: Partial Allergies Info: NKA   Insulin Sliding Scale Info: 4/day       Current Medications (01/28/2016):  This is the current hospital active medication list Current Facility-Administered Medications  Medication Dose Route Frequency Provider Last Rate Last  Dose  . 0.9 %  sodium chloride infusion  250 mL Intravenous PRN Rahul P Desai, PA-C 10 mL/hr at 01/28/16 1304 250 mL at 01/28/16 1304  . antiseptic oral rinse (CPC / CETYLPYRIDINIUM CHLORIDE 0.05%) solution 7 mL  7 mL Mouth Rinse BID [name], MD   7 mL at 01/28/16 1000  . feeding supplement (GLUCERNA SHAKE) (GLUCERNA SHAKE) liquid 237 mL  237 mL Oral TID BM Javier Glazier, MD   237 mL at 01/28/16 1400  . fentaNYL (SUBLIMAZE) injection 25-50 mcg  25-50 mcg  Intravenous Q2H PRN Juanito Doom, MD   25 mcg at 01/19/16 1420  . food thickener (THICK IT) powder   Oral PRN [name], MD      . guaiFENesin-dextromethorphan (ROBITUSSIN DM) 100-10 MG/5ML syrup 5 mL  5 mL Oral Q4H PRN [name], MD   5 mL at 01/28/16 1312  . heparin injection 3,000 Units  3,000 Units Intravenous PRN Erick Colace, NP   3,000 Units at 01/25/16 2046  . insulin aspart (novoLOG) injection 0-15 Units  0-15 Units Subcutaneous TID WC [name], MD   3 Units at 01/28/16 1304  . insulin aspart (novoLOG) injection 0-5 Units  0-5 Units Subcutaneous QHS Javier Glazier, MD   2 Units at 01/23/16 2234  . latanoprost (XALATAN) 0.005 % ophthalmic solution 1 drop  1 drop Both Eyes QHS Dimas Chyle, MD   1 drop at 01/27/16 2114  . lidocaine (XYLOCAINE) 2 % viscous mouth solution 15 mL  15 mL Mouth/Throat Q3H PRN [name], MD   15 mL at 01/22/16 0747  . LORazepam (ATIVAN) injection 1 mg  1 mg Intravenous Q4H PRN Gene Domingo Cocking, MD      . midodrine (PROAMATINE) tablet 10 mg  10 mg Oral TID WC [name], MD   10 mg at 01/28/16 1304  . nystatin (MYCOSTATIN) 100000 UNIT/ML suspension 500,000 Units  5 mL Oral QID Javier Glazier, MD   500,000 Units at 01/28/16 1304  . pantoprazole (PROTONIX) EC tablet 40 mg  40 mg Oral Daily Javier Glazier, MD   40 mg at 01/28/16 0802  . phenol (CHLORASEPTIC) mouth spray 1 spray  1 spray Mouth/Throat PRN Javier Glazier, MD   1 spray at 01/25/16 2058  . polyethylene glycol (MIRALAX / GLYCOLAX) packet 17 g  17 g Oral Daily PRN [name], MD   17 g at 01/24/16 1809  . sodium chloride flush (NS) 0.9 % injection 10-40 mL  10-40 mL Intracatheter Q12H Javier Glazier, MD   10 mL at 01/27/16 2114  . sodium chloride flush (NS) 0.9 % injection 10-40 mL  10-40 mL Intracatheter PRN Javier Glazier, MD   10 mL at 01/28/16 1051  . sucralfate (CARAFATE) 1 GM/10ML suspension 1 g  1 g Oral 4 times per day [name], MD   1  g at 01/28/16 1304  . timolol (TIMOPTIC) 0.5 % ophthalmic solution 1 drop  1 drop Both Eyes BID Dimas Chyle, MD   1 drop at 01/28/16 0802     Discharge Medications: Please see discharge summary for a list of discharge medications.  Relevant Imaging Results:  Relevant Lab Results:   Additional Information SS#: [id number]  [place], [place]

## 2016-01-29 LAB — BASIC METABOLIC PANEL
Anion gap: 12 (ref 5–15)
BUN: 32 mg/dL — AB (ref 6–20)
CHLORIDE: 107 mmol/L (ref 101–111)
CO2: 24 mmol/L (ref 22–32)
CREATININE: 3.21 mg/dL — AB (ref 0.44–1.00)
Calcium: 8.4 mg/dL — ABNORMAL LOW (ref 8.9–10.3)
GFR calc Af Amer: 16 mL/min — ABNORMAL LOW (ref >60)
GFR calc non Af Amer: 14 mL/min — ABNORMAL LOW (ref >60)
Glucose, Bld: 204 mg/dL — ABNORMAL HIGH (ref 65–99)
Potassium: 3.8 mmol/L (ref 3.5–5.1)
Sodium: 143 mmol/L (ref 135–145)

## 2016-01-29 LAB — GLUCOSE, CAPILLARY
GLUCOSE-CAPILLARY: 160 mg/dL — AB (ref 65–99)
Glucose-Capillary: 172 mg/dL — ABNORMAL HIGH (ref 65–99)
Glucose-Capillary: 139 mg/dL — ABNORMAL HIGH (ref 65–99)

## 2016-01-29 NOTE — Clinical Social Work Placement (Signed)
   CLINICAL SOCIAL WORK PLACEMENT  NOTE  Date:  01/29/2016  Patient Details  Name: Toyya Barbella MRN: [id number] Date of Birth: 02-02-1951  Clinical Social Work is seeking post-discharge placement for this patient at the [place] level of care (*CSW will initial, date and re-position this form in  chart as items are completed):  Yes   Patient/family provided with [place] Work Department's list of facilities offering this level of care within the geographic area requested by the patient (or if unable, by the patient's family).  Yes   Patient/family informed of their freedom to choose among providers that offer the needed level of care, that participate in Medicare, Medicaid or managed care program needed by the patient, have an available bed and are willing to accept the patient.  Yes   Patient/family informed of [facility]'s ownership interest in [facility] and [facility], as well as of the fact that they are under no obligation to receive care at these facilities.  PASRR submitted to EDS on 01/28/16     PASRR number received on 01/28/16     Existing PASRR number confirmed on       FL2 transmitted to all facilities in geographic area requested by pt/family on       FL2 transmitted to all facilities within larger geographic area on       Patient informed that his/her managed care company has contracts with or will negotiate with certain facilities, including the following:        Yes   Patient/family informed of bed offers received.  Patient chooses bed at [facility]     Physician recommends and patient chooses bed at      Patient to be transferred to   on  .  Patient to be transferred to facility by       Patient family notified on   of transfer.  Name of family member notified:        PHYSICIAN Please sign FL2     Additional Comment:    _______________________________________________ Ladell Pier, LCSW 01/29/2016, 3:50 PM

## 2016-01-29 NOTE — Care Management Note (Signed)
Case Management Note  Patient Details  Name: Chava Axt MRN: [id number] Date of Birth: 1951-10-04  Subjective/Objective: Awating auth for SNF.                   Action/Plan:d/c plan SNF.   Expected Discharge Date:   (UNKNOWN)               Expected Discharge Plan:  Skilled Nursing Facility  In-House Referral:  NA  Discharge planning Services  CM Consult  Post Acute Care Choice:  NA Choice offered to:  NA  DME Arranged:    DME Agency:     HH Arranged:    HH Agency:     Status of Service:  In process, will continue to follow  Medicare Important Message Given:    Date Medicare IM Given:    Medicare IM give by:    Date Additional Medicare IM Given:    Additional Medicare Important Message give by:     If discussed at [place] of Stay Meetings, dates discussed:    Additional Comments:  Dessa Phi, RN 01/29/2016, 3:57 PM

## 2016-01-29 NOTE — Progress Notes (Signed)
Physical Therapy Treatment Patient Details Name: Brittany Schwartz MRN: [id number] DOB: Feb 07, 1951 Today's Date: 01/29/2016    History of Present Illness 65 year old female with  endometrioid endometrial carcinoma , Now with rapid recurrent disease since mid January with large volume malignant ascites, involvement pelvis and abdominal. She had 2u PRBC transfusion 2/9 for symptomatic anemia. During transfusion, she complained of SOB with diaphoresis. She was subsequently sent to ED for further evaluation.. required intubation 2/9-2/12.    PT Comments    The patient's daughter present and states that the patient and spouse are still interested in SNF> recommend  SW follow up. Family reports sister in law will be coming , daughters are OOT.  Patient is much improved and able to progress with ambulation . Currently will require someone assisting her with all aspects of ADLs.  Follow Up Recommendations  SNF;Supervision/Assistance - 24 hour     Equipment Recommendations  Rolling walker with 5" wheels;3in1 (PT)    Recommendations for Other Services       Precautions / Restrictions Precautions Precautions: Fall Precaution Comments: frail    Mobility  Bed Mobility   Bed Mobility: Rolling Rolling: Mod assist Sidelying to sit: Mod assist       General bed mobility comments: cues to roll, guidance to raisl, assist  for trunk into upright position, noted abd and pelvic/leg edema  Transfers Overall transfer level: Needs assistance Equipment used: Rolling walker (2 wheeled) Transfers: Sit to/from Stand Sit to Stand: Min assist;From elevated surface         General transfer comment: improvesd standing up from raised bed. cues for  hand placement  Ambulation/Gait Ambulation/Gait assistance: Min assist;Mod assist;+2 safety/equipment Ambulation Distance (Feet): 55 Feet Assistive device: Rolling walker (2 wheeled) Gait Pattern/deviations: Step-to pattern;Step-through pattern;Wide base  of support Gait velocity: decr.   General Gait Details: patient relates that feet and legs are tight. much improved in ambulation, gait is  unsteady but RW is helpful.   Stairs            Wheelchair Mobility    Modified Rankin (Stroke Patients Only)       Balance Overall balance assessment: Needs assistance Sitting-balance support: No upper extremity supported;Feet supported Sitting balance-Leahy Scale: Fair     Standing balance support: Bilateral upper extremity supported;During functional activity Standing balance-Leahy Scale: Fair                      Cognition Arousal/Alertness: Awake/alert                          Exercises      General Comments        Pertinent Vitals/Pain Pain Assessment: Faces Faces Pain Scale: Hurts little more Pain Location: legs and feet due to edema Pain Descriptors / Indicators: Tightness    Home Living                      Prior Function            PT Goals (current goals can now be found in the care plan section) Progress towards PT goals: Progressing toward goals    Frequency  Min 3X/week    PT Plan Current plan remains appropriate    Co-evaluation             End of Session Equipment Utilized During Treatment: Gait belt Activity Tolerance: Patient tolerated treatment well Patient left: in chair;with call bell/phone  within reach;with chair alarm set;with family/visitor present     Time: 0852-0922 PT Time Calculation (min) (ACUTE ONLY): 30 min  Charges:  $Gait Training: 23-37 mins                    G Codes:      Marcelino Freestone PT [id number]  01/29/2016, 9:36 AM

## 2016-01-29 NOTE — Progress Notes (Signed)
CSW continuing to follow.    CSW followed up with pt and pt niece at bedside.   CSW introduced self and explained role.   Pt confirmed that she worked with PT this morning. CSW reviewed PT notes and discussed that PT recommending SNF or if pt returns home pt will requires someone to assist with all aspects of ADLs. Pt niece called pt daughter in order for pt daughter to be a part of the conversation. Pt daughter inquired if pt could remain in the hospital until pt able to walk with her walker and CSW explained that once pt medically cleared by MD, insurance will not cover hospitalization for PT and that is why pt go to rehab to progress mobility and strength. Pt daughter expressed understanding. Weekend CSW had initiated SNF search and CSW provided SNF bed offers. Pt currently only has bed offer from [facility] and [facility]. CSW explained that pt insurance, [employer] is contracted with few facilities and requires authorization prior to pt discharge from the hospital. CSW clarified pt daughter questions about SNF vs Home Health  Pt daughter planned to speak with pt husband in order to make decision re: disposition plan and choose SNF bed if pt family chooses short term rehab route.   CSW received phone call back from pt daughter stating that pt and pt family have chosen to pursue short term rehab. Pt daughter states that pt family has chosen [facility] for pt rehab needs.   CSW contacted [facility] and notified facility of acceptance of bed offer. [facility] states that they will initiate insurance authorization through [place].   CSW to continue to follow to provide support and assist with pt disposition needs.   Alison Murray, MSW, [place] Work [phone]

## 2016-01-29 NOTE — Progress Notes (Signed)
Progress Note   Brittany Schwartz [id number] DOB: [birth year] DOA: 01/11/2016 PCP: [name], MD   Brief Narrative:   Brittany Schwartz is an 65 y.o. 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 She is followed by Dr. Marko Plume with oncology for ongoing chemo, last dose given 01/10/16.  She had 2u PRBC transfusion 01/11/16 for symptomatic anemia. During transfusion, she complained of SOB with diaphoresis. She was subsequently sent to ED for further evaluation where she required BiPAP. CXR showed significant pulmonary edema. Labs were significant for ARF with a creatinine of 3.61 and a potassium of 7.2. Sodium was 119. Because of progressive respiratory failure and inability to tolerate BiPAP, she was intubated in the ED. CVVHD was started 01/12/16 and a ureteral stent was placed to/11/17 after CT scan demonstrated a left UVJ mass with hydronephrosis. She was extubated on 01/14/16. [facility] asked to assume care 01/26/16 after an extensive hospital stay.  Assessment/Plan:   Principal Problems:   Acute respiratory failure with hypoxemia ([place]), resolved - Intubated from 01/11/16-01/14/16. - Respiratory status stable postextubation. - Continue Robitussin DM.    Septic shock, resolved secondary to bilateral lower lobe pneumonia - Completed a 10 day course of empiric vancomycin and Zosyn. - Required pressor support with Levophed, and continues to need midodrine for blood pressure support. - Cultures all negative.     Acute renal failure (HCC)/hyperkalemia - Multifactorial including third spacing/malignant ascites, underlying diabetes, and CT contrast nephropathy and obstruction. - Status post bilateral ureteral stents 01/13/16. - Off CRRT since 01/22/16. Nephrology to pull dialysis catheter and  sign off. - Creatinine beginning to trend down, 3.8---> 3.67--->3.45--->3.21.  Active Problems:   Possible adrenal insufficiency - Stress dose steroids completed 01/15/16. - Cortisol 27.9 on 01/24/16.    Mucositis/thrush - Continue nystatin, Carafate and viscous lidocaine.    Constipation - Continue MiraLAX. Bowels moving.     Demand ischemia/elevated troponin I - 2-D echo done 01/13/16. EF 65-70 percent. Grade 1 diastolic dysfunction. No regional wall motion abnormalities.    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 Rapidly recurrent with bulky metastatic involvement of abdomen/pelvis. - Disease progression despite chemotherapy noted. - Perform paracentesis for significant symptoms.    Type 2 diabetes mellitus without complication (HCC) - Currently being managed with moderate scale SSI. CBGs 128-178.    Chemotherapy-induced peripheral neuropathy [facility])    Physical deconditioning - Physical therapy evaluation performed. SNF recommended, however family does not feel they can afford it..    Antineoplastic chemotherapy induced anemia/pancytopenia - Blood counts stable. - Received 2 units of PRBCs 01/11/16 for symptomatic anemia.    Protein-calorie malnutrition, severe ([place]) - Significant hypoalbuminemia with low protein stores noted.  - Continue Magic cup twice a day per dietitian recommendations.    Acute encephalopathy/Delirium, resolved - Resolved, felt to be multifactorial.    DVT Prophylaxis - SCDs ordered.   Family Communication/Anticipated D/C date and plan/Code Status   Family Communication: Daughter on telephone. Disposition Plan/date: Stable for discharge to SNF when bed available. Social worker consulted for SNF placement. Code Status: Limited CODE BLUE, drugs and CRRT only.   IV Access:    HD catheter placed 01/11/16  Port-A-Cath   Procedures and diagnostic studies:   Ct Abdomen Pelvis Wo  Contrast  01/11/2016  CLINICAL DATA:  Advanced endometrial cancer  with peritoneal spread. Recent transfusion for anemia, subsequently developing shortness of breath. EXAM: CT CHEST, ABDOMEN AND PELVIS WITHOUT CONTRAST TECHNIQUE: Multidetector CT imaging of the chest, abdomen and pelvis was performed following the standard protocol without IV contrast. COMPARISON:  CT abdomen pelvis with contrast 01/08/2016. FINDINGS: CT CHEST FINDINGS Patient has been intubated. Endotracheal tube is near carina and should be withdrawn 3-4 cm. Port-A-Cath tip unchanged proximal RIGHT atrium. New LEFT IJ dual-lumen venous catheter distal SVC. No pneumothorax. Mediastinum/Lymph Nodes: No masses or pathologically enlarged lymph nodes identified on this un-enhanced exam. Lungs/Pleura: BILATERAL pleural effusions, larger on the RIGHT. Suspected LEFT greater than RIGHT lower lobe infiltrates. Perihilar opacities suggesting superimposed pulmonary edema. Musculoskeletal: No chest wall mass or suspicious bone lesions identified. CT ABDOMEN PELVIS FINDINGS Hepatobiliary: No mass visualized on this un-enhanced exam. Pancreas: No mass or inflammatory process identified on this un-enhanced exam. Spleen: Within normal limits in size. Adrenals/Urinary Tract: LEFT hydronephrosis redemonstrated. BILATERAL contrast nephrograms persists from the February 6 scan indicating acute renal failure. Large pelvic mass. Extrinsic mass effect on the bladder with posterior wall invasion and LEFT UVJ mass effect contributes to hydronephrosis. Stomach/Bowel: No evidence of obstruction, inflammatory process, or abnormal fluid collections. Vascular/Lymphatic: No pathologically enlarged lymph nodes. No evidence of abdominal aortic aneurysm. Reproductive: Status post hysterectomy. Widespread omental tumor. Marked worsening of ascites. Other: None. Musculoskeletal:  No suspicious bone lesions identified. IMPRESSION: Marked worsening aeration since priors. Pulmonary  edema with superimposed lower lobe infiltrates, LEFT greater than RIGHT. Endotracheal tube too low.  Withdrawal 3-4 cm. Marked worsening ascites. Pelvic tumor redemonstrated with bladder invasion. Persistent contrast nephrograms with LEFT hydronephrosis. Electronically Signed   By: Staci Righter M.D.   On: 01/11/2016 21:56   Dg Abd 1 View  01/12/2016  CLINICAL DATA:  65 year old female - NG tube advancement. EXAM: ABDOMEN - 1 VIEW COMPARISON:  01/12/2016 FINDINGS: An NG tube is identified with tip overlying the distal stomach. No other significant abnormalities noted. IMPRESSION: NG tube with tip overlying the distal stomach. Electronically Signed   By: Margarette Canada M.D.   On: 01/12/2016 18:32   Ct Chest Wo Contrast  01/11/2016  CLINICAL DATA:  Advanced endometrial cancer with peritoneal spread. Recent transfusion for anemia, subsequently developing shortness of breath. EXAM: CT CHEST, ABDOMEN AND PELVIS WITHOUT CONTRAST TECHNIQUE: Multidetector CT imaging of the chest, abdomen and pelvis was performed following the standard protocol without IV contrast. COMPARISON:  CT abdomen pelvis with contrast 01/08/2016. FINDINGS: CT CHEST FINDINGS Patient has been intubated. Endotracheal tube is near carina and should be withdrawn 3-4 cm. Port-A-Cath tip unchanged proximal RIGHT atrium. New LEFT IJ dual-lumen venous catheter distal SVC. No pneumothorax. Mediastinum/Lymph Nodes: No masses or pathologically enlarged lymph nodes identified on this un-enhanced exam. Lungs/Pleura: BILATERAL pleural effusions, larger on the RIGHT. Suspected LEFT greater than RIGHT lower lobe infiltrates. Perihilar opacities suggesting superimposed pulmonary edema. Musculoskeletal: No chest wall mass or suspicious bone lesions identified. CT ABDOMEN PELVIS FINDINGS Hepatobiliary: No mass visualized on this un-enhanced exam. Pancreas: No mass or inflammatory process identified on this un-enhanced exam. Spleen: Within normal limits in size.  Adrenals/Urinary Tract: LEFT hydronephrosis redemonstrated. BILATERAL contrast nephrograms persists from the February 6 scan indicating acute renal failure. Large pelvic mass. Extrinsic mass effect on the bladder with posterior wall invasion and LEFT UVJ mass effect contributes to hydronephrosis. Stomach/Bowel: No evidence of obstruction, inflammatory process, or abnormal fluid collections. Vascular/Lymphatic: No pathologically enlarged lymph nodes. No evidence of abdominal aortic aneurysm. Reproductive: Status post hysterectomy. Widespread  omental tumor. Marked worsening of ascites. Other: None. Musculoskeletal:  No suspicious bone lesions identified. IMPRESSION: Marked worsening aeration since priors. Pulmonary edema with superimposed lower lobe infiltrates, LEFT greater than RIGHT. Endotracheal tube too low.  Withdrawal 3-4 cm. Marked worsening ascites. Pelvic tumor redemonstrated with bladder invasion. Persistent contrast nephrograms with LEFT hydronephrosis. Electronically Signed   By: Staci Righter M.D.   On: 01/11/2016 21:56   Dg Chest Port 1 View  01/12/2016  CLINICAL DATA:  65 year old female with respiratory failure. Metastatic endometrial cancer. Shortness of breath, intubated. Initial encounter. EXAM: PORTABLE CHEST 1 VIEW COMPARISON:  CT chest abdomen and pelvis 01/11/2016 and earlier. FINDINGS: Portable AP semi upright view at 0454 hours. Endotracheal tube tip now projects in good position just below the clavicles. Right chest porta cath remains in place. Left IJ approach dual lumen dialysis type catheter at the lower SVC level. Normal cardiac size and mediastinal contours. No pneumothorax or pulmonary edema. Confluent bilateral lower lobe consolidation is less apparent than on yesterday CT. Small right pleural effusion on that exam also is not evident today. No areas of worsening ventilation. IMPRESSION: 1. Endotracheal tube tip in good position at the level the clavicles. 2.  Otherwise, stable  lines and tubes. 3. No new cardiopulmonary finding. Bilateral lower lobe consolidation compatible with pneumonia. Small right pleural effusion seen yesterday not evident radiographically. Electronically Signed   By: Genevie Ann M.D.   On: 01/12/2016 06:57   Dg Chest Port 1 View  01/11/2016  CLINICAL DATA:  Status post intubation and central line placement EXAM: PORTABLE CHEST 1 VIEW COMPARISON:  January 11, 2016 FINDINGS: Endotracheal tube is identified with distal tip 1.4 cm from carina. Retraction by 2.5 cm is recommended. Dual lumen left jugular central venous line are identified with distal tip in the superior vena cava. Right central venous line is identified with distal tip in the superior vena cava unchanged. There is pulmonary edema. There is no pleural effusion or focal pneumonia. There is no pneumothorax. The osseous structures are stable. IMPRESSION: Endotracheal tube distal tip 1.5 cm from carina. Retraction by 2.5 cm is recommended. Left central venous line distal tips in the superior vena cava. No pneumothorax. Pulmonary edema. These results will be called to the ordering clinician or representative by the Radiologist Assistant, and communication documented in the PACS or zVision Dashboard. Electronically Signed   By: Abelardo Diesel M.D.   On: 01/11/2016 21:13   Dg Chest Portable 1 View  01/11/2016  CLINICAL DATA:  History of uterine cancer with abdominal ascites. Patient became hypertensive and diaphoretic during blood transfusion. EXAM: PORTABLE CHEST 1 VIEW COMPARISON:  CT of the chest 01/13/2015 FINDINGS: Right internal jugular approach injectable port terminates within the expected location of superior vena cava. Cardiomediastinal silhouette is normal. Mediastinal contours appear intact. There is no evidence of pneumothorax. Lung volumes are low. There is bilateral interstitial thickening which may be seen with pulmonary vascular congestion. More confluent left lower lobe airspace consolidation  cannot be excluded. Osseous structures are without acute abnormality. Soft tissues are grossly normal. IMPRESSION: Low lung volumes with bilateral interstitial thickening, likely representing pulmonary vascular congestion. More confluent left lower lobe airspace consolidation cannot be excluded. Electronically Signed   By: Fidela Salisbury M.D.   On: 01/11/2016 16:51   Dg Abd Portable 1v  01/12/2016  CLINICAL DATA:  NG tube placement. EXAM: PORTABLE ABDOMEN - 1 VIEW COMPARISON:  CT of the abdomen pelvis 01/10/2014 FINDINGS: Enteric catheter tip is seen overlying the  mid upper abdomen, location is uncertain as no gastric bubble is seen, however it overlies the expected location of the proximal stomach. There is nonobstructive bowel gas pattern. Hyperdense renal shadows are seen. Bridging osteophytes of the lumbosacral spine are noted. IMPRESSION: Enteric catheter overlies expected location of the proximal stomach. Its location is uncertain as no gastric bubble is visualized. Nonobstructive bowel gas pattern. Hyperdense appearance of the renal shadows may represent retention of contrast from CT performed 15 hours ago, indicative of renal function impairment. Electronically Signed   By: Fidela Salisbury M.D.   On: 01/12/2016 11:48     Medical Consultants:    Oncology  Pulmonology  Nephrology  Anti-Infectives:    Vancomycin 01/11/16---> 01/21/16  Zosyn 01/11/16---> 01/21/16  Acyclovir 01/19/16---> 01/24/16  Fluconazole 01/19/16---> 01/24/16   Subjective:   Brittany Schwartz continues to deny pain, nausea, vomiting, dyspnea. Her abdomen still feels tight, but says she can tolerate it. Appetite okay. Bowels are moving.  Objective:    Filed Vitals:   01/28/16 0504 01/28/16 1417 01/28/16 2028 01/29/16 0459  BP: 129/67 117/75 106/59 128/59  Pulse: 92 99 94 84  Temp: 98.1 F (36.7 C) 98 F (36.7 C) 99 F (37.2 C) 98.5 F (36.9 C)  TempSrc: Axillary Oral Oral Oral  Resp: 18 18 18 16    Height:      Weight:      SpO2: 97% 95% 97% 99%    Intake/Output Summary (Last 24 hours) at 01/29/16 1143 Last data filed at 01/29/16 0643  Gross per 24 hour  Intake    355 ml  Output    700 ml  Net   -345 ml   Filed Weights   01/25/16 0500 01/25/16 1805 01/27/16 0612  Weight: 64.8 kg (142 lb 13.7 oz) 69.174 kg (152 lb 8 oz) 68.493 kg (151 lb)    Exam: Gen:  NAD Cardiovascular:  RRR, No M/R/G Respiratory:  Lungs diminished Gastrointestinal:  Abdomen firm, distended Extremities:  + Edema   Data Reviewed:    Labs: Basic Metabolic Panel:  Recent Labs Lab 01/25/16 0535 01/26/16 0445 01/27/16 0653 01/28/16 0650 01/29/16 0709  NA 137 137 141 141 143  K 4.0 3.9 4.0 3.8 3.8  CL 103 103 106 105 107  CO2 24 24 24 24 24   GLUCOSE 175* 163* 206* 187* 204*  BUN 36* 36* 37* 38* 32*  CREATININE 3.36* 3.80* 3.67* 3.45* 3.21*  CALCIUM 8.2* 8.4* 8.6* 8.5* 8.4*   GFR Estimated Creatinine Clearance: 15.5 mL/min (by C-G formula based on Cr of 3.21). Liver Function Tests:  Recent Labs Lab 01/23/16 0500  AST 29  ALT 19  ALKPHOS 98  BILITOT 0.5  PROT 5.2*  ALBUMIN 1.7*   CBC:  Recent Labs Lab 01/23/16 0500 01/24/16 0540 01/25/16 0535 01/26/16 0445 01/27/16 0653 01/28/16 0650  WBC 6.1 9.2 10.2 11.0* 12.6* 14.3*  NEUTROABS 5.2 7.3 8.5* 9.3* 10.7*  --   HGB 8.2* 8.0* 7.7* 7.6* 7.9* 7.7*  HCT 24.7* 23.9* 23.4* 23.5* 24.6* 23.5*  MCV 89.5 89.5 89.7 89.4 90.1 89.7  PLT 35* 48* 77* 126* 214 274   CBG:  Recent Labs Lab 01/28/16 0734 01/28/16 1211 01/28/16 1724 01/28/16 2032 01/29/16 0855  GLUCAP 163* 175* 167* 132* 172*   Microbiology No results found for this or any previous visit (from the past 240 hour(s)).   Medications:   . antiseptic oral rinse  7 mL Mouth Rinse BID  . feeding supplement (GLUCERNA SHAKE)  237 mL Oral TID  BM  . insulin aspart  0-15 Units Subcutaneous TID WC  . insulin aspart  0-5 Units Subcutaneous QHS  . latanoprost  1 drop  Both Eyes QHS  . midodrine  10 mg Oral TID WC  . nystatin  5 mL Oral QID  . pantoprazole  40 mg Oral Daily  . sodium chloride flush  10-40 mL Intracatheter Q12H  . sucralfate  1 g Oral 4 times per day  . timolol  1 drop Both Eyes BID   Continuous Infusions:   Time spent: 25 minutes.    LOS: 18 days   [facility] Pager [phone]. If unable to reach me by pager, please call my cell phone at [phone].  *Please refer to [email or web address], password [email or web address] to get updated schedule on who will round on this patient, as hospitalists switch teams weekly. If 7PM-7AM, please contact night-coverage at [email or web address], password [email or web address] for any overnight needs.  01/29/2016, 11:43 AM

## 2016-01-30 ENCOUNTER — Telehealth: Payer: Self-pay | Admitting: Oncology

## 2016-01-30 ENCOUNTER — Other Ambulatory Visit: Payer: Self-pay | Admitting: Oncology

## 2016-01-30 LAB — BASIC METABOLIC PANEL
Anion gap: 9 (ref 5–15)
BUN: 30 mg/dL — ABNORMAL HIGH (ref 6–20)
CALCIUM: 8.4 mg/dL — AB (ref 8.9–10.3)
CO2: 25 mmol/L (ref 22–32)
CREATININE: 2.82 mg/dL — AB (ref 0.44–1.00)
Chloride: 111 mmol/L (ref 101–111)
GFR calc Af Amer: 19 mL/min — ABNORMAL LOW (ref >60)
GFR calc non Af Amer: 17 mL/min — ABNORMAL LOW (ref >60)
Glucose, Bld: 188 mg/dL — ABNORMAL HIGH (ref 65–99)
Potassium: 3.6 mmol/L (ref 3.5–5.1)
SODIUM: 145 mmol/L (ref 135–145)

## 2016-01-30 LAB — GLUCOSE, CAPILLARY
GLUCOSE-CAPILLARY: 166 mg/dL — AB (ref 65–99)
Glucose-Capillary: 142 mg/dL — ABNORMAL HIGH (ref 65–99)
Glucose-Capillary: 196 mg/dL — ABNORMAL HIGH (ref 65–99)
Glucose-Capillary: 155 mg/dL — ABNORMAL HIGH (ref 65–99)

## 2016-01-30 MED ORDER — NYSTATIN 100000 UNIT/ML MT SUSP: 5.0000 mL | Freq: Four times a day (QID) | OROMUCOSAL | Status: DC

## 2016-01-30 MED ORDER — HEPARIN SOD (PORK) LOCK FLUSH 100 UNIT/ML IV SOLN
500.0000 [IU] | Freq: Once | INTRAVENOUS | Status: AC
  Administered 2016-01-30: 500 [IU] via INTRAVENOUS
  Filled 2016-01-30: qty 5

## 2016-01-30 MED ORDER — LORAZEPAM 0.5 MG PO TABS: ORAL_TABLET | ORAL | Status: DC

## 2016-01-30 MED ORDER — MIDODRINE HCL 10 MG PO TABS: 10.0000 mg | ORAL_TABLET | Freq: Three times a day (TID) | ORAL | Status: DC

## 2016-01-30 MED ORDER — OXYCODONE HCL 5 MG PO TABS: ORAL_TABLET | ORAL | Status: DC

## 2016-01-30 MED ORDER — POLYETHYLENE GLYCOL 3350 17 G PO PACK: 17.0000 g | PACK | Freq: Every day | ORAL | Status: DC | PRN

## 2016-01-30 NOTE — Telephone Encounter (Signed)
per pof to sch pt appt-cld & spoke to daughter and gave appt time & date

## 2016-01-30 NOTE — Care Management Note (Signed)
Case Management Note  Patient Details  Name: Brittany Schwartz MRN: [id number] Date of Birth: 1951/02/08  Subjective/Objective: d/c SNF. CSW following for auth from insurance to SNF.                   Action/Plan:d/c SNF.   Expected Discharge Date:   (UNKNOWN)               Expected Discharge Plan:  Skilled Nursing Facility  In-House Referral:  NA  Discharge planning Services  CM Consult  Post Acute Care Choice:  NA Choice offered to:  NA  DME Arranged:    DME Agency:     HH Arranged:    HH Agency:     Status of Service:  Completed, signed off  Medicare Important Message Given:    Date Medicare IM Given:    Medicare IM give by:    Date Additional Medicare IM Given:    Additional Medicare Important Message give by:     If discussed at [place] of Stay Meetings, dates discussed:    Additional Comments:  Dessa Phi, RN 01/30/2016, 12:26 PM

## 2016-01-30 NOTE — Progress Notes (Signed)
MEDICAL ONCOLOGY January 30, 2016, 11:07 AM  Hospital day 20  Chemotherapy:  adriamycin 01-10-16 as salvage for recurrent endometrial carcinoma (adjuvant chemotherapy for IIIA high grade serous endometrial carcinoma completed 06-16-15)  EMR reviewed. Patient seen with daughter here. FMLA papers completed for daughter by MD now  Subjective: Awake, alert, appears comfortable lying in bed on RA. Denies pain. Some increase in urine output. PO intake not too good tho she is taking some glucerna and some ice cream, some early satiety but seems mostly just that food including from home does not appeal. Walked short distance in hall with PT yesterday. Denies SOB in bed. Throat discomfort nearly resolved. Abdomen "heavy", wondering about repeat paracentesis (not done since 4 liters on 01-09-16, just prior to acute events of this hospitalization). Denies bleeding. Wants to exercise.  Objective: Vital signs in last 24 hours: Blood pressure 113/72, pulse 83, temperature 97.5 F (36.4 C), temperature source Oral, resp. rate 18, height _0  (1.549 m), weight 147 lb 6.4 oz (66.86 kg), SpO2 99 %. Looks generally weak but better than when I saw her last Alert, oriented, appropriate. Respirations not labored RA. Mucous membranes pale. Lungs clear anteriorly. Heart RRR. PAC ok. Abdomen full with fluid wave, not tight, quiet, not tender to gentle exam. Feet warm, less LE edema. Foley still in. Moves all extremities.  Intake/Output from previous day: 02/27 0701 - 02/28 0700 In: 480 [P.O.:480] Out: 300 [Urine:300] Intake/Output this shift:   Lab Results:  Recent Labs  01/28/16 0650  WBC 14.3*  HGB 7.7*  HCT 23.5*  PLT 274   BMET  Recent Labs  01/29/16 0709 01/30/16 0505  NA 143 145  K 3.8 3.6  CL 107 111  CO2 24 25  GLUCOSE 204* 188*  BUN 32* 30*  CREATININE 3.21* 2.82*  CALCIUM 8.4* 8.4*    Studies/Results: No results found.   Assessment/Plan: 1. High grade endometrial carcinoma  rapidly recurrent in Jan, with large volume malignant ascites and bulky metastatic involvement abdomen/ pelvis. First adriamycin given 01-09-16 in palliative attempt.PS too poor to give additional chemo at this time. Plan for short term rehab then home. [place] genetics counselors will meet with daughters, MSI/ IHC requested on original path.  If paracentesis before DC, needs to be limited volume. If malignant ascites becomes more of a problem again would consider peritoneal drain to allow management (small volumes) out of hospital. I will make return appointment to my clinic in 2-3 weeks to follow up. FMLA papers done now for daughter  Initial diagnosis was IIIA grade 3 serous/ endometrioid endometrial carcinoma with extensive miliary disease at surgery by gyn oncology 01-19-15, then adjuvant carboplatin taxol thru 06-16-15, followed by vaginal brachytherapy.  2.acute hypoxic respiratory failure 01-11-16, initially requiring ventilator support 3.acute renal failure related to CT contrast, third spacing fluids with malignant ascites, underlying diabetes. Nephrology involved 4.PAC in 5.Foley in since in ICU 6.anemia: multifactorial with bloody ascites, chemo, renal failure. Was transfused in ICU 7.poor nutritional status tho intake some better. Encouraged glucerna, follow 8.limited code. Underlying malignancy has poor prognosis.  Please page me if needed between my rounds [place]

## 2016-01-30 NOTE — Discharge Summary (Addendum)
Physician Discharge Summary  Brittany Schwartz [id number] DOB: Feb 14, 1951 DOA: 01/11/2016  PCP: [name], MD  Admit date: 01/11/2016 Discharge date: 01/30/2016   Recommendations for Outpatient Follow-Up:   1. Please monitor creatinine every other day until stable. 2. Refer to outpatient palliative care services to assist with hospice transition following ST rehab. 3. May do paracentesis PRN, for recurrent severe symptoms, but would try to limit until renal function back to baseline. 4. F/U with Dr. Marko Plume, office will arrange.   Discharge Diagnosis:   Principal Problem:    Acute respiratory failure with hypoxemia (HCC) and septic shock secondary to bilateral lower lobe pneumonia complicated by acute renal failure Active Problems:    Endometrial cancer (HCC), high grade    Type 2 diabetes mellitus without complication (HCC)    Chemotherapy-induced peripheral neuropathy (HCC)    Antineoplastic chemotherapy induced anemia and pancytopenia    Malignant ascites    Protein-calorie malnutrition, severe (HCC)    Hyperkalemia    Acute respiratory failure with hypoxia (HCC)    Delirium    Acute renal failure (HCC)    Septic shock ([place])    Encounter for palliative care    Goals of care, counseling/discussion    Oral thrush    Shock circulatory ([place])    Bilateral lower lobe pneumonia    Possible adrenal insufficiency    Mucositis    Demand ischemia    Elevated troponin    Physical deconditioning   Discharge disposition:  SNF: [facility]  Discharge Condition: Improved.  Diet recommendation: Low sodium, heart healthy.    History of Present Illness:   Brittany Schwartz is an 65 y.o. 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  She is followed by Dr. Marko Plume with oncology for ongoing chemo, last dose given 01/10/16. She had 2u PRBC transfusion 01/11/16 for symptomatic anemia. During transfusion, she complained of SOB with diaphoresis. She was subsequently sent to ED for further evaluation where she required BiPAP. CXR showed significant pulmonary edema. Labs were significant for ARF with a creatinine of 3.61 and a potassium of 7.2. Sodium was 119. Because of progressive respiratory failure and inability to tolerate BiPAP, she was intubated in the ED. CVVHD was started 01/12/16 and a ureteral stent was placed to/11/17 after CT scan demonstrated a left UVJ mass with hydronephrosis. She was extubated on 01/14/16. [facility] asked to assume care 01/26/16 after an extensive hospital stay.   Hospital Course by Problem:   Principal Problems:  Acute respiratory failure with hypoxemia ([place]), resolved - Intubated from 01/11/16-01/14/16. - Respiratory status stable postextubation. - Continue Robitussin DM.   Septic shock, resolved secondary to bilateral lower lobe pneumonia - Completed a 10 day course of empiric vancomycin and Zosyn. - Required pressor support with Levophed, and continues to need midodrine for blood pressure support. - Cultures all negative.    Acute renal failure (HCC)/hyperkalemia - Multifactorial including third spacing/malignant ascites, underlying diabetes, and CT contrast nephropathy and obstruction. - Status post bilateral ureteral stents 01/13/16. - Off CRRT since 01/22/16.  - Creatinine continues to trend down, 3.8---> 3.67--->3.45--->3.21---> 2.82.  Active Problems:  Possible adrenal insufficiency - Stress dose steroids completed 01/15/16. - Cortisol 27.9 on 01/24/16.   Mucositis/thrush - Continue nystatin, Carafate and viscous lidocaine.   Constipation - Continue MiraLAX. Bowels moving.    Demand  ischemia/elevated troponin I - 2-D echo done 01/13/16. EF 65-70 percent. Grade 1 diastolic dysfunction. No  regional wall motion abnormalities.   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 Rapidly recurrent with bulky metastatic involvement of abdomen/pelvis. - Disease progression despite chemotherapy noted. - Perform paracentesis for significant symptoms.   Type 2 diabetes mellitus without complication (HCC) - Currently being managed with moderate scale SSI. CBGs 128-178.   Chemotherapy-induced peripheral neuropathy [facility])   Physical deconditioning - Physical therapy evaluation performed. SNF recommended, however family does not feel they can afford it..   Antineoplastic chemotherapy induced anemia/pancytopenia - Blood counts stable. - Received 2 units of PRBCs 01/11/16 for symptomatic anemia.   Protein-calorie malnutrition, severe ([place]) - Significant hypoalbuminemia with low protein stores noted.  - Continue Magic cup twice a day per dietitian recommendations.   Acute encephalopathy/Delirium, resolved - Resolved, felt to be multifactorial.  Medical Consultants:    None.   Discharge Exam:   Filed Vitals:   01/29/16 2251 01/30/16 0642  BP: 108/52 113/72  Pulse: 89 83  Temp: 98.2 F (36.8 C) 97.5 F (36.4 C)  Resp: 18 18   Filed Vitals:   01/29/16 1329 01/29/16 1607 01/29/16 2251 01/30/16 0642  BP:  121/58 108/52 113/72  Pulse:  88 89 83  Temp:  93.3 F (34.1 C) 98.2 F (36.8 C) 97.5 F (36.4 C)  TempSrc:  Oral Oral Oral  Resp:  18 18 18   Height:      Weight: 66.86 kg (147 lb 6.4 oz)     SpO2:  100% 100% 99%    Gen:  NAD Cardiovascular:  RRR, No M/R/G Respiratory: Lungs CTAB Gastrointestinal: Abdomen distended/firm, +BS Extremities: No C/E/C   The results of significant diagnostics from this hospitalization (including imaging, microbiology, ancillary and laboratory) are listed below for reference.     Procedures and Diagnostic Studies:   Ct Abdomen Pelvis Wo Contrast  01/11/2016  CLINICAL DATA:   Advanced endometrial cancer with peritoneal spread. Recent transfusion for anemia, subsequently developing shortness of breath. EXAM: CT CHEST, ABDOMEN AND PELVIS WITHOUT CONTRAST TECHNIQUE: Multidetector CT imaging of the chest, abdomen and pelvis was performed following the standard protocol without IV contrast. COMPARISON:  CT abdomen pelvis with contrast 01/08/2016. FINDINGS: CT CHEST FINDINGS Patient has been intubated. Endotracheal tube is near carina and should be withdrawn 3-4 cm. Port-A-Cath tip unchanged proximal RIGHT atrium. New LEFT IJ dual-lumen venous catheter distal SVC. No pneumothorax. Mediastinum/Lymph Nodes: No masses or pathologically enlarged lymph nodes identified on this un-enhanced exam. Lungs/Pleura: BILATERAL pleural effusions, larger on the RIGHT. Suspected LEFT greater than RIGHT lower lobe infiltrates. Perihilar opacities suggesting superimposed pulmonary edema. Musculoskeletal: No chest wall mass or suspicious bone lesions identified. CT ABDOMEN PELVIS FINDINGS Hepatobiliary: No mass visualized on this un-enhanced exam. Pancreas: No mass or inflammatory process identified on this un-enhanced exam. Spleen: Within normal limits in size. Adrenals/Urinary Tract: LEFT hydronephrosis redemonstrated. BILATERAL contrast nephrograms persists from the February 6 scan indicating acute renal failure. Large pelvic mass. Extrinsic mass effect on the bladder with posterior wall invasion and LEFT UVJ mass effect contributes to hydronephrosis. Stomach/Bowel: No evidence of obstruction, inflammatory process, or abnormal fluid collections. Vascular/Lymphatic: No pathologically enlarged lymph nodes. No evidence of abdominal aortic aneurysm. Reproductive: Status post hysterectomy. Widespread omental tumor. Marked worsening of ascites. Other: None. Musculoskeletal:  No suspicious bone lesions identified. IMPRESSION: Marked worsening aeration since priors. Pulmonary edema with superimposed lower lobe  infiltrates, LEFT greater than RIGHT. Endotracheal  tube too low.  Withdrawal 3-4 cm. Marked worsening ascites. Pelvic tumor redemonstrated with bladder invasion. Persistent contrast nephrograms with LEFT hydronephrosis. Electronically Signed   By: Staci Righter M.D.   On: 01/11/2016 21:56   Dg Abd 1 View  01/12/2016  CLINICAL DATA:  65 year old female - NG tube advancement. EXAM: ABDOMEN - 1 VIEW COMPARISON:  01/12/2016 FINDINGS: An NG tube is identified with tip overlying the distal stomach. No other significant abnormalities noted. IMPRESSION: NG tube with tip overlying the distal stomach. Electronically Signed   By: Margarette Canada M.D.   On: 01/12/2016 18:32   Ct Chest Wo Contrast  01/11/2016  CLINICAL DATA:  Advanced endometrial cancer with peritoneal spread. Recent transfusion for anemia, subsequently developing shortness of breath. EXAM: CT CHEST, ABDOMEN AND PELVIS WITHOUT CONTRAST TECHNIQUE: Multidetector CT imaging of the chest, abdomen and pelvis was performed following the standard protocol without IV contrast. COMPARISON:  CT abdomen pelvis with contrast 01/08/2016. FINDINGS: CT CHEST FINDINGS Patient has been intubated. Endotracheal tube is near carina and should be withdrawn 3-4 cm. Port-A-Cath tip unchanged proximal RIGHT atrium. New LEFT IJ dual-lumen venous catheter distal SVC. No pneumothorax. Mediastinum/Lymph Nodes: No masses or pathologically enlarged lymph nodes identified on this un-enhanced exam. Lungs/Pleura: BILATERAL pleural effusions, larger on the RIGHT. Suspected LEFT greater than RIGHT lower lobe infiltrates. Perihilar opacities suggesting superimposed pulmonary edema. Musculoskeletal: No chest wall mass or suspicious bone lesions identified. CT ABDOMEN PELVIS FINDINGS Hepatobiliary: No mass visualized on this un-enhanced exam. Pancreas: No mass or inflammatory process identified on this un-enhanced exam. Spleen: Within normal limits in size. Adrenals/Urinary Tract: LEFT  hydronephrosis redemonstrated. BILATERAL contrast nephrograms persists from the February 6 scan indicating acute renal failure. Large pelvic mass. Extrinsic mass effect on the bladder with posterior wall invasion and LEFT UVJ mass effect contributes to hydronephrosis. Stomach/Bowel: No evidence of obstruction, inflammatory process, or abnormal fluid collections. Vascular/Lymphatic: No pathologically enlarged lymph nodes. No evidence of abdominal aortic aneurysm. Reproductive: Status post hysterectomy. Widespread omental tumor. Marked worsening of ascites. Other: None. Musculoskeletal:  No suspicious bone lesions identified. IMPRESSION: Marked worsening aeration since priors. Pulmonary edema with superimposed lower lobe infiltrates, LEFT greater than RIGHT. Endotracheal tube too low.  Withdrawal 3-4 cm. Marked worsening ascites. Pelvic tumor redemonstrated with bladder invasion. Persistent contrast nephrograms with LEFT hydronephrosis. Electronically Signed   By: Staci Righter M.D.   On: 01/11/2016 21:56   Dg Chest Port 1 View  01/12/2016  CLINICAL DATA:  65 year old female with respiratory failure. Metastatic endometrial cancer. Shortness of breath, intubated. Initial encounter. EXAM: PORTABLE CHEST 1 VIEW COMPARISON:  CT chest abdomen and pelvis 01/11/2016 and earlier. FINDINGS: Portable AP semi upright view at 0454 hours. Endotracheal tube tip now projects in good position just below the clavicles. Right chest porta cath remains in place. Left IJ approach dual lumen dialysis type catheter at the lower SVC level. Normal cardiac size and mediastinal contours. No pneumothorax or pulmonary edema. Confluent bilateral lower lobe consolidation is less apparent than on yesterday CT. Small right pleural effusion on that exam also is not evident today. No areas of worsening ventilation. IMPRESSION: 1. Endotracheal tube tip in good position at the level the clavicles. 2.  Otherwise, stable lines and tubes. 3. No new  cardiopulmonary finding. Bilateral lower lobe consolidation compatible with pneumonia. Small right pleural effusion seen yesterday not evident radiographically. Electronically Signed   By: Genevie Ann M.D.   On: 01/12/2016 06:57   Dg Chest Port 1 View  01/11/2016  CLINICAL DATA:  Status post intubation and central line placement EXAM: PORTABLE CHEST 1 VIEW COMPARISON:  January 11, 2016 FINDINGS: Endotracheal tube is identified with distal tip 1.4 cm from carina. Retraction by 2.5 cm is recommended. Dual lumen left jugular central venous line are identified with distal tip in the superior vena cava. Right central venous line is identified with distal tip in the superior vena cava unchanged. There is pulmonary edema. There is no pleural effusion or focal pneumonia. There is no pneumothorax. The osseous structures are stable. IMPRESSION: Endotracheal tube distal tip 1.5 cm from carina. Retraction by 2.5 cm is recommended. Left central venous line distal tips in the superior vena cava. No pneumothorax. Pulmonary edema. These results will be called to the ordering clinician or representative by the Radiologist Assistant, and communication documented in the PACS or zVision Dashboard. Electronically Signed   By: Abelardo Diesel M.D.   On: 01/11/2016 21:13   Dg Chest Portable 1 View  01/11/2016  CLINICAL DATA:  History of uterine cancer with abdominal ascites. Patient became hypertensive and diaphoretic during blood transfusion. EXAM: PORTABLE CHEST 1 VIEW COMPARISON:  CT of the chest 01/13/2015 FINDINGS: Right internal jugular approach injectable port terminates within the expected location of superior vena cava. Cardiomediastinal silhouette is normal. Mediastinal contours appear intact. There is no evidence of pneumothorax. Lung volumes are low. There is bilateral interstitial thickening which may be seen with pulmonary vascular congestion. More confluent left lower lobe airspace consolidation cannot be excluded. Osseous  structures are without acute abnormality. Soft tissues are grossly normal. IMPRESSION: Low lung volumes with bilateral interstitial thickening, likely representing pulmonary vascular congestion. More confluent left lower lobe airspace consolidation cannot be excluded. Electronically Signed   By: Fidela Salisbury M.D.   On: 01/11/2016 16:51   Dg Abd Portable 1v  01/12/2016  CLINICAL DATA:  NG tube placement. EXAM: PORTABLE ABDOMEN - 1 VIEW COMPARISON:  CT of the abdomen pelvis 01/10/2014 FINDINGS: Enteric catheter tip is seen overlying the mid upper abdomen, location is uncertain as no gastric bubble is seen, however it overlies the expected location of the proximal stomach. There is nonobstructive bowel gas pattern. Hyperdense renal shadows are seen. Bridging osteophytes of the lumbosacral spine are noted. IMPRESSION: Enteric catheter overlies expected location of the proximal stomach. Its location is uncertain as no gastric bubble is visualized. Nonobstructive bowel gas pattern. Hyperdense appearance of the renal shadows may represent retention of contrast from CT performed 15 hours ago, indicative of renal function impairment. Electronically Signed   By: Fidela Salisbury M.D.   On: 01/12/2016 11:48     Labs:   Basic Metabolic Panel:  Recent Labs Lab 01/26/16 0445 01/27/16 0653 01/28/16 0650 01/29/16 0709 01/30/16 0505  NA 137 141 141 143 145  K 3.9 4.0 3.8 3.8 3.6  CL 103 106 105 107 111  CO2 24 24 24 24 25   GLUCOSE 163* 206* 187* 204* 188*  BUN 36* 37* 38* 32* 30*  CREATININE 3.80* 3.67* 3.45* 3.21* 2.82*  CALCIUM 8.4* 8.6* 8.5* 8.4* 8.4*   GFR Estimated Creatinine Clearance: 17.4 mL/min (by C-G formula based on Cr of 2.82). Liver Function Tests: No results for input(s): AST, ALT, ALKPHOS, BILITOT, PROT, ALBUMIN in the last 168 hours. No results for input(s): LIPASE, AMYLASE in the last 168 hours. No results for input(s): AMMONIA in the last 168 hours. Coagulation  profile No results for input(s): INR, PROTIME in the last 168 hours.  CBC:  Recent Labs Lab 01/24/16 0540  01/25/16 0535 01/26/16 0445 01/27/16 0653 01/28/16 0650  WBC 9.2 10.2 11.0* 12.6* 14.3*  NEUTROABS 7.3 8.5* 9.3* 10.7*  --   HGB 8.0* 7.7* 7.6* 7.9* 7.7*  HCT 23.9* 23.4* 23.5* 24.6* 23.5*  MCV 89.5 89.7 89.4 90.1 89.7  PLT 48* 77* 126* 214 274   CBG:  Recent Labs Lab 01/29/16 0855 01/29/16 1252 01/29/16 1734 01/29/16 2251 01/30/16 0733  GLUCAP 172* 160* 139* 142* 166*     Discharge Instructions:   Discharge Instructions    Call MD for:  extreme fatigue    Complete by:  As directed      Call MD for:  persistant nausea and vomiting    Complete by:  As directed      Call MD for:  severe uncontrolled pain    Complete by:  As directed      Call MD for:  temperature >100.4    Complete by:  As directed      Diet - low sodium heart healthy    Complete by:  As directed      Increase activity slowly    Complete by:  As directed      Walk with assistance    Complete by:  As directed      Walker     Complete by:  As directed             Medication List    STOP taking these medications        furosemide 40 MG tablet  Commonly known as:  LASIX     gabapentin 100 MG capsule  Commonly known as:  NEURONTIN     magnesium 30 MG tablet     prochlorperazine 10 MG tablet  Commonly known as:  COMPAZINE      TAKE these medications        acetaminophen 325 MG tablet  Commonly known as:  TYLENOL  Take 650 mg by mouth every 6 (six) hours as needed for mild pain, moderate pain or headache. Reported on 01/08/2016     feeding supplement (GLUCERNA SHAKE) Liqd  Take 237 mLs by mouth daily.     ferrous fumarate 325 (106 Fe) MG Tabs tablet  Commonly known as:  HEMOCYTE - 106 mg FE  Take 1 tablet daily  on an empty stomach with OJ or vitamin C tablet     glipiZIDE 5 MG 24 hr tablet  Commonly known as:  GLUCOTROL XL  Take 5 mg by mouth 2 (two) times daily.      latanoprost 0.005 % ophthalmic solution  Commonly known as:  XALATAN  Place 1 drop into both eyes at bedtime.     lidocaine-prilocaine cream  Commonly known as:  EMLA  Apply to Porta-Cath 1-2 hrs prior to access as directed.     LORazepam 0.5 MG tablet  Commonly known as:  ATIVAN  Place 1 tablet under tongue or swallow every 8 hours as needed for nausea. Will make drowsy.     metFORMIN 1000 MG tablet  Commonly known as:  GLUCOPHAGE  Take 1,000 mg by mouth 2 (two) times daily.     midodrine 10 MG tablet  Commonly known as:  PROAMATINE  Take 1 tablet (10 mg total) by mouth 3 (three) times daily with meals.     nystatin 100000 UNIT/ML suspension  Commonly known as:  MYCOSTATIN  Take 5 mLs (500,000 Units total) by mouth 4 (four) times daily.     ondansetron 8 MG tablet  Commonly known as:  ZOFRAN  Take 1 tablet by mouth every 8 hours as needed for nausea. Will not make drowsy.     oxyCODONE 5 MG immediate release tablet  Commonly known as:  ROXICODONE  Take 1 tablet every 4-6 hours as needed for pain.     pantoprazole 40 MG tablet  Commonly known as:  PROTONIX  Take 1 tablet (40 mg total) by mouth daily.     polyethylene glycol packet  Commonly known as:  MIRALAX / GLYCOLAX  Take 17 g by mouth daily as needed for mild constipation.     pravastatin 20 MG tablet  Commonly known as:  PRAVACHOL  Take 20 mg by mouth daily.     protein supplement shake Liqd  Commonly known as:  PREMIER PROTEIN  Take 237 mLs (8 oz total) by mouth 2 (two) times daily between meals.     timolol 0.5 % ophthalmic solution  Commonly known as:  BETIMOL  Place 1 drop into both eyes 2 (two) times daily.          Time coordinating discharge: 45 minutes.  Signed:  RAMA,CHRISTINA  Pager [phone] [facility] 01/30/2016, 11:02 AM

## 2016-01-30 NOTE — Progress Notes (Signed)
Pt for discharge to [facility].  CSW confirmed with [facility] that facility received [employer] authorization.   CSW facilitated pt discharge needs including contacting facility, faxing pt discharge information via epic hub, discussing with pt and pt family at bedside, providing RN phone number to call report, and arranging ambulance transport via [place].   CSW clarified pt families questions and provided support surrounding transfer.  No further social work needs identified at this time.  CSW signing off.   Alison Murray, MSW, [place] Work [phone]

## 2016-01-30 NOTE — Progress Notes (Signed)
Palliative team following and noted plans for discharge to [facility]-please recommend facility make referral to outpatient palliative care services in the discharge summary so they can assist with hospice transition following short term rehab. Patient and family realistic about prognosis and goals but are hopeful she can regain some of her functional status and independence for the time she has left. No additional acute palliative needs. Will sign off -please re-consult or call if her condition or goals change.  Lane Hacker, DO Palliative Medicine [phone]

## 2016-01-30 NOTE — Clinical Social Work Placement (Signed)
   CLINICAL SOCIAL WORK PLACEMENT  NOTE  Date:  01/30/2016  Patient Details  Name: Brittany Schwartz MRN: [id number] Date of Birth: 23-Aug-1951  Clinical Social Work is seeking post-discharge placement for this patient at the [place] level of care (*CSW will initial, date and re-position this form in  chart as items are completed):  Yes   Patient/family provided with [place] Work Department's list of facilities offering this level of care within the geographic area requested by the patient (or if unable, by the patient's family).  Yes   Patient/family informed of their freedom to choose among providers that offer the needed level of care, that participate in Medicare, Medicaid or managed care program needed by the patient, have an available bed and are willing to accept the patient.  Yes   Patient/family informed of [place]'s ownership interest in [facility] and [facility], as well as of the fact that they are under no obligation to receive care at these facilities.  PASRR submitted to EDS on 01/28/16     PASRR number received on 01/28/16     Existing PASRR number confirmed on       FL2 transmitted to all facilities in geographic area requested by pt/family on       FL2 transmitted to all facilities within larger geographic area on       Patient informed that his/her managed care company has contracts with or will negotiate with certain facilities, including the following:        Yes   Patient/family informed of bed offers received.  Patient chooses bed at [facility]     Physician recommends and patient chooses bed at      Patient to be transferred to [facility] on 01/30/16.  Patient to be transferred to facility by ambulance [facility])     Patient family notified on 01/30/16 of transfer.  Name of family member notified:  pt and pt family notified at bedside     PHYSICIAN Please sign FL2      Additional Comment:    _______________________________________________ Ladell Pier, LCSW 01/30/2016, 3:19 PM

## 2016-02-01 ENCOUNTER — Non-Acute Institutional Stay (SKILLED_NURSING_FACILITY): Payer: BLUE CROSS/BLUE SHIELD | Admitting: Adult Health

## 2016-02-01 DIAGNOSIS — R180 Malignant ascites: Secondary | ICD-10-CM

## 2016-02-01 DIAGNOSIS — C541 Malignant neoplasm of endometrium: Secondary | ICD-10-CM

## 2016-02-01 DIAGNOSIS — J9601 Acute respiratory failure with hypoxia: Secondary | ICD-10-CM | POA: Diagnosis not present

## 2016-02-01 NOTE — Progress Notes (Signed)
Patient ID: Brittany Schwartz, female   DOB: 06-Jan-1951, 65 y.o.   MRN: [id number]   Facility: [facility]       No Known Allergies  Chief Complaint  Patient presents with  . Discharge Note    HPI:  She had been hospitalized for bilateral pneumonia and sepsis. She has high grade endometrial cancer. She will need to be seen by palliative care. Her family wants to have her moved to another snf. She will be followed up by the medical providers at that facility.    Past Medical History  Diagnosis Date  . Hypertension   . Glaucoma   . Diabetes mellitus without complication ([name])   . Palpitations   . Muscle cramps   . Postmenopausal vaginal bleeding   . Uterine cancer [facility])     Past Surgical History  Procedure Laterality Date  . Robotic assisted total hysterectomy with bilateral salpingo oopherectomy Bilateral 01/19/2015    Procedure: XI ROBOTIC ASSISTED TOTAL HYSTERECTOMY WITH BILATERAL SALPINGO OOPHORECTOMY;  Surgeon: [name], MD;  Location: [facility];  Service: Gynecology;  Laterality: Bilateral;  . Cystoscopy w/ ureteral stent placement Bilateral 01/13/2016    Procedure: CYSTOSCOPY WITH RETROGRADE PYELOGRAM/BILATERAL URETERAL STENT PLACEMENT;  Surgeon: [name], MD;  Location: [facility];  Service: Urology;  Laterality: Bilateral;    VITAL SIGNS BP 128/67 mmHg  Pulse 96  Temp(Src) 99.8 F (37.7 C) (Oral)  Resp 20  SpO2 96%  Patient's Medications  New Prescriptions   No medications on file  Previous Medications   ACETAMINOPHEN (TYLENOL) 325 MG TABLET    Take 650 mg by mouth every 6 (six) hours as needed for mild pain, moderate pain or headache. Reported on 01/08/2016   FEEDING SUPPLEMENT, GLUCERNA SHAKE, (GLUCERNA SHAKE) LIQD    Take 237 mLs by mouth daily.   FERROUS FUMARATE (HEMOCYTE - 106 MG FE) 325 (106 FE) MG TABS TABLET    Take 1 tablet daily  on an empty stomach with OJ or vitamin C tablet   GLIPIZIDE (GLUCOTROL XL) 5 MG 24 HR TABLET    Take 5 mg by mouth 2  (two) times daily.   INSULIN LISPRO (HUMALOG KWIKPEN) 100 UNIT/ML KIWKPEN    Inject into the skin. Inject per sliding scale: if 0-140 =0; 150-600=3, subcutaneously before meals   LATANOPROST (XALATAN) 0.005 % OPHTHALMIC SOLUTION    Place 1 drop into both eyes at bedtime.   LIDOCAINE-PRILOCAINE (EMLA) CREAM    Apply to Porta-Cath 1-2 hrs prior to access as directed.   LORAZEPAM (ATIVAN) 0.5 MG TABLET    Place 1 tablet under tongue or swallow every 8 hours as needed for nausea. Will make drowsy.   MIDODRINE (PROAMATINE) 10 MG TABLET    Take 1 tablet (10 mg total) by mouth 3 (three) times daily with meals.   NYSTATIN (MYCOSTATIN) 100000 UNIT/ML SUSPENSION    Take 5 mLs (500,000 Units total) by mouth 4 (four) times daily.   ONDANSETRON (ZOFRAN) 8 MG TABLET    Take 1 tablet by mouth every 8 hours as needed for nausea. Will not make drowsy.   OXYCODONE (ROXICODONE) 5 MG IMMEDIATE RELEASE TABLET    Take 1 tablet every 4-6 hours as needed for pain.   PANTOPRAZOLE (PROTONIX) 40 MG TABLET    Take 1 tablet (40 mg total) by mouth daily.   POLYETHYLENE GLYCOL (MIRALAX / GLYCOLAX) PACKET    Take 17 g by mouth daily as needed for mild constipation.   PRAVASTATIN (PRAVACHOL) 20 MG TABLET  Take 20 mg by mouth daily.   TIMOLOL (BETIMOL) 0.5 % OPHTHALMIC SOLUTION    Place 1 drop into both eyes 2 (two) times daily.  Modified Medications   No medications on file  Discontinued Medications     SIGNIFICANT DIAGNOSTIC EXAMS  01-11-16: chest x-ray: Low lung volumes with bilateral interstitial thickening, likely representing pulmonary vascular congestion. More confluent left lower lobe airspace consolidation cannot be excluded.  01-11-16: ct of chest abdomen and pelvis: Marked worsening aeration since priors. Pulmonary edema with superimposed lower lobe infiltrates, LEFT greater than RIGHT.  Endotracheal tube too low.  Withdrawal 3-4 cm. Marked worsening ascites. Pelvic tumor redemonstrated with bladder  invasion. Persistent contrast nephrograms with LEFT hydronephrosis.   01-12-16: kub: Enteric catheter overlies expected location of the proximal stomach. Its location is uncertain as no gastric bubble is visualized. Nonobstructive bowel gas pattern. Hyperdense appearance of the renal shadows may represent retention of contrast from CT performed 15 hours ago, indicative of renal function impairment.  01-13-16: 2-d echo: Left ventricle: The cavity size was normal. Wall thickness was normal. Systolic function was vigorous. The estimated ejection fraction was in the range of 65% to 70%. Wall motion was normal; there were no regional wall motion abnormalities. Doppler parameters are consistent with abnormal left ventricular relaxation (grade 1 diastolic dysfunction).  01-16-16: abdominal ultrasound: Small amount of ascites. None of the pockets of fluid are large enough for paracentesis currently.  01-16-16: chest x-ray: 1. Interval extubation. 2. Question tiny left pleural effusion   LABS REVIEWED:   01-11-16: wbc 20.2; hgb 12.9; hct 37.6; mcv 87.6; plt 257; glucose 369; bun 61; creat 3.61; k+ 7.2; na++119; liver normal albumin 2.1; ionized ca++ 4.4; blood and urine culture: no growth 01-13-16: wbc 16.3; hgb 10.4; hct 29.7; mcv 87.1; plt 111; glucose 155; bun 34; creat 1.94; k+ 5.0; na++ 132 phos 4.4; albumin 1.6 01-16-16: iron 83; tibc 11`5 01-28-16: wbc 14.3; hgb 7.7; hct 23.5; mcv 89.7; plt 274; glucose 187; bun 38; creat 3.45; k+ 3.8; na++141    Review of Systems  Constitutional: Negative for malaise/fatigue.  Respiratory: Negative for cough and shortness of breath.   Cardiovascular: Negative for chest pain, palpitations and leg swelling.  Gastrointestinal: Negative for heartburn, abdominal pain and constipation.  Musculoskeletal: Negative for myalgias, back pain and joint pain.  Skin: Negative.   Neurological: Negative for dizziness.  Psychiatric/Behavioral: The patient is not nervous/anxious.      Physical Exam  Constitutional: She is oriented to person, place, and time. No distress.  Frail   Eyes: Conjunctivae are normal.  Neck: Neck supple. No JVD present. No thyromegaly present.  Cardiovascular: Normal rate, regular rhythm and intact distal pulses.   Respiratory: Effort normal and breath sounds normal. No respiratory distress. She has no wheezes.  GI: Soft. Bowel sounds are normal. She exhibits distension. There is no tenderness.  Large distention present.   Musculoskeletal: She exhibits no edema.  Able to move all extremities   Lymphadenopathy:    She has no cervical adenopathy.  Neurological: She is alert and oriented to person, place, and time.  Skin: Skin is warm and dry. She is not diaphoretic.  Psychiatric: She has a normal mood and affect.      ASSESSMENT/ PLAN:  Will discharge her to [facility] per her family request. She will not need dme; home health or prescriptions to be written. She will follow up medically at the receiving facility.    Time spent with patient  40  minutes >50%  time spent counseling; reviewing medical record; tests; labs; and developing future plan of care    Ok Edwards NP [facility]  Contact [phone] Monday through Friday 8am- 5pm  After hours call [phone]

## 2016-02-04 LAB — BASIC METABOLIC PANEL
BUN: 32 mg/dL — AB (ref 4–21)
Creatinine: 2.3 mg/dL — AB (ref <=1.1)
GLUCOSE: 44 mg/dL
POTASSIUM: 5 mmol/L (ref 3.4–5.3)
SODIUM: 145 mmol/L (ref 137–147)

## 2016-02-05 ENCOUNTER — Encounter: Payer: Self-pay | Admitting: Internal Medicine

## 2016-02-05 ENCOUNTER — Non-Acute Institutional Stay (SKILLED_NURSING_FACILITY): Payer: BLUE CROSS/BLUE SHIELD | Admitting: Internal Medicine

## 2016-02-05 DIAGNOSIS — D6481 Anemia due to antineoplastic chemotherapy: Secondary | ICD-10-CM | POA: Diagnosis not present

## 2016-02-05 DIAGNOSIS — T451X5A Adverse effect of antineoplastic and immunosuppressive drugs, initial encounter: Secondary | ICD-10-CM

## 2016-02-05 DIAGNOSIS — G620 Drug-induced polyneuropathy: Secondary | ICD-10-CM | POA: Diagnosis not present

## 2016-02-05 DIAGNOSIS — Z794 Long term (current) use of insulin: Secondary | ICD-10-CM

## 2016-02-05 DIAGNOSIS — R180 Malignant ascites: Secondary | ICD-10-CM

## 2016-02-05 DIAGNOSIS — K219 Gastro-esophageal reflux disease without esophagitis: Secondary | ICD-10-CM

## 2016-02-05 DIAGNOSIS — C541 Malignant neoplasm of endometrium: Secondary | ICD-10-CM | POA: Diagnosis not present

## 2016-02-05 DIAGNOSIS — E11649 Type 2 diabetes mellitus with hypoglycemia without coma: Secondary | ICD-10-CM | POA: Diagnosis not present

## 2016-02-05 DIAGNOSIS — I10 Essential (primary) hypertension: Secondary | ICD-10-CM | POA: Diagnosis not present

## 2016-02-05 DIAGNOSIS — E43 Unspecified severe protein-calorie malnutrition: Secondary | ICD-10-CM

## 2016-02-05 DIAGNOSIS — R0600 Dyspnea, unspecified: Secondary | ICD-10-CM

## 2016-02-05 DIAGNOSIS — J189 Pneumonia, unspecified organism: Secondary | ICD-10-CM

## 2016-02-05 NOTE — Progress Notes (Signed)
Patient ID: Brittany Schwartz, female   DOB: 1951/04/12, 65 y.o.   MRN: [id number]    HISTORY AND PHYSICAL   DATE:   Location:  [facility]    Place of Service: SNF (31)   Extended Emergency Contact Information Primary Emergency Contact: Burtis,Sonal Address: [address], [place] Phone: [phone] Mobile Phone: [phone] Relation: Daughter  Advanced Directive information Does patient have an advance directive?: Yes, Type of Advance Directive: Living will, Does patient want to make changes to advanced directive?: No - Patient declined  Chief Complaint  Patient presents with  . New Admit To SNF    HPI:    Past Medical History  Diagnosis Date  . Hypertension   . Glaucoma   . Diabetes mellitus without complication ([place])   . Palpitations   . Muscle cramps   . Postmenopausal vaginal bleeding   . Uterine cancer [facility])     Past Surgical History  Procedure Laterality Date  . Robotic assisted total hysterectomy with bilateral salpingo oopherectomy Bilateral 01/19/2015    Procedure: XI ROBOTIC ASSISTED TOTAL HYSTERECTOMY WITH BILATERAL SALPINGO OOPHORECTOMY;  Surgeon: [name], MD;  Location: [facility];  Service: Gynecology;  Laterality: Bilateral;  . Cystoscopy w/ ureteral stent placement Bilateral 01/13/2016    Procedure: CYSTOSCOPY WITH RETROGRADE PYELOGRAM/BILATERAL URETERAL STENT PLACEMENT;  Surgeon: [name], MD;  Location: [facility];  Service: Urology;  Laterality: Bilateral;    Patient Care Team: [name], MD as PCP - General (Endocrinology)  Social History   Social History  . Marital Status: Married    Spouse Name: N/A  . Number of Children: 2  . Years of Education: N/A   Occupational History  . Not on file.   Social History Main Topics  . Smoking status: Never Smoker   . Smokeless tobacco: Never Used  . Alcohol Use: No  . Drug Use: No  . Sexual Activity: Yes   Other Topics  Concern  . Not on file   Social History Narrative     reports that she has never smoked. She has never used smokeless tobacco. She reports that she does not drink alcohol or use illicit drugs.  Family History  Problem Relation Age of Onset  . Diabetes Father   . Arrhythmia Mother    No family status information on file.    Immunization History  Administered Date(s) Administered  . Influenza,inj,Quad PF,36+ Mos 01/08/2016    No Known Allergies  Medications: Patient's Medications  New Prescriptions   No medications on file  Previous Medications   ACETAMINOPHEN (TYLENOL) 325 MG TABLET    Take 650 mg by mouth every 6 (six) hours as needed for mild pain, moderate pain or headache. Reported on 01/08/2016   FEEDING SUPPLEMENT, GLUCERNA SHAKE, (GLUCERNA SHAKE) LIQD    Take 237 mLs by mouth daily.   FERROUS FUMARATE (HEMOCYTE - 106 MG FE) 325 (106 FE) MG TABS TABLET    Take 1 tablet daily  on an empty stomach with OJ or vitamin C tablet   GLIPIZIDE (GLUCOTROL XL) 5 MG 24 HR TABLET    Take 5 mg by mouth 2 (two) times daily.   INSULIN LISPRO (HUMALOG KWIKPEN) 100 UNIT/ML KIWKPEN    Inject into the skin. Inject per sliding scale: if 0-140 =0; 150-600=3, subcutaneously before meals   LATANOPROST (XALATAN) 0.005 % OPHTHALMIC SOLUTION    Place 1 drop into both eyes at bedtime.  LIDOCAINE-PRILOCAINE (EMLA) CREAM    Apply to Porta-Cath 1-2 hrs prior to access as directed.   LORAZEPAM (ATIVAN) 0.5 MG TABLET    Place 1 tablet under tongue or swallow every 8 hours as needed for nausea. Will make drowsy.   METFORMIN (GLUCOPHAGE) 1000 MG TABLET    Take 1,000 mg by mouth 2 (two) times daily with a meal.   MIDODRINE (PROAMATINE) 10 MG TABLET    Take 1 tablet (10 mg total) by mouth 3 (three) times daily with meals.   NYSTATIN (MYCOSTATIN) 100000 UNIT/ML SUSPENSION    Take 5 mLs (500,000 Units total) by mouth 4 (four) times daily.   ONDANSETRON (ZOFRAN) 8 MG TABLET    Take 1 tablet by mouth every 8 hours  as needed for nausea. Will not make drowsy.   OXYCODONE (ROXICODONE) 5 MG IMMEDIATE RELEASE TABLET    Take 1 tablet every 4-6 hours as needed for pain.   PANTOPRAZOLE (PROTONIX) 40 MG TABLET    Take 1 tablet (40 mg total) by mouth daily.   POLYETHYLENE GLYCOL (MIRALAX / GLYCOLAX) PACKET    Take 17 g by mouth daily as needed for mild constipation.   PRAVASTATIN (PRAVACHOL) 20 MG TABLET    Take 20 mg by mouth daily.   TIMOLOL (BETIMOL) 0.5 % OPHTHALMIC SOLUTION    Place 1 drop into both eyes 2 (two) times daily.  Modified Medications   No medications on file  Discontinued Medications   No medications on file    Review of Systems  Filed Vitals:   02/05/16 0935  BP: 106/64  Pulse: 59  Temp: 100 F (37.8 C)  TempSrc: Oral  Resp: 16  SpO2: 100%   There is no weight on file to calculate BMI.  Physical Exam   Labs reviewed: Nursing Home on 02/05/2016  Component Date Value Ref Range Status  . Glucose 02/04/2016 44   Final  . BUN 02/04/2016 32* 4 - 21 mg/dL Final  . Creatinine 02/04/2016 2.3* .5 - 1.1 mg/dL Final  . Potassium 02/04/2016 5.0  3.4 - 5.3 mmol/L Final  . Sodium 02/04/2016 145  137 - 147 mmol/L Final  Admission on 01/11/2016, Discharged on 01/30/2016  No results displayed because visit has over 200 results.    Hospital Outpatient Visit on 01/11/2016  Component Date Value Ref Range Status  . Glucose-Capillary 01/11/2016 278* 65 - 99 mg/dL Final  . Comment 1 01/11/2016 Call MD NNP PA CNM   Final  Hospital Outpatient Visit on 01/10/2016  Component Date Value Ref Range Status  . Order Confirmation 01/10/2016 ORDER PROCESSED BY BLOOD BANK   Final  . ABO/RH(D) 01/10/2016 B POS   Final  . Antibody Screen 01/10/2016 NEG   Final  . Sample Expiration 01/10/2016 01/13/2016   Final  . Unit Number 01/10/2016 [id number]   Final  . Blood Component Type 01/10/2016 RED CELLS,LR   Final  . Unit division 01/10/2016 00   Final  . Status of Unit 01/10/2016 ISSUED,FINAL   Final    . Transfusion Status 01/10/2016 OK TO TRANSFUSE   Final  . Crossmatch Result 01/10/2016 Compatible   Final  . Unit Number 01/10/2016 [id number]   Final  . Blood Component Type 01/10/2016 RED CELLS,LR   Final  . Unit division 01/10/2016 00   Final  . Status of Unit 01/10/2016 ISSUED,FINAL   Final  . Transfusion Status 01/10/2016 OK TO TRANSFUSE   Final  . Crossmatch Result 01/10/2016 Compatible   Final  Appointment on  01/10/2016  Component Date Value Ref Range Status  . WBC 01/10/2016 15.2* 3.9 - 10.3 10e3/uL Final  . NEUT# 01/10/2016 13.1* 1.5 - 6.5 10e3/uL Final  . HGB 01/10/2016 8.6* 11.6 - 15.9 g/dL Final  . HCT 01/10/2016 26.8* 34.8 - 46.6 % Final  . Platelets 01/10/2016 245  145 - 400 10e3/uL Final  . MCV 01/10/2016 89.6  79.5 - 101.0 fL Final  . MCH 01/10/2016 28.7  25.1 - 34.0 pg Final  . MCHC 01/10/2016 32.1  31.5 - 36.0 g/dL Final  . RBC 01/10/2016 2.99* 3.70 - 5.45 10e6/uL Final  . RDW 01/10/2016 13.5  11.2 - 14.5 % Final  . lymph# 01/10/2016 0.7* 0.9 - 3.3 10e3/uL Final  . MONO# 01/10/2016 1.3* 0.1 - 0.9 10e3/uL Final  . Eosinophils Absolute 01/10/2016 0.0  0.0 - 0.5 10e3/uL Final  . Basophils Absolute 01/10/2016 0.0  0.0 - 0.1 10e3/uL Final  . NEUT% 01/10/2016 85.9* 38.4 - 76.8 % Final  . LYMPH% 01/10/2016 4.9* 14.0 - 49.7 % Final  . MONO% 01/10/2016 8.8  0.0 - 14.0 % Final  . EOS% 01/10/2016 0.2  0.0 - 7.0 % Final  . BASO% 01/10/2016 0.2  0.0 - 2.0 % Final  Appointment on 01/08/2016  Component Date Value Ref Range Status  . WBC 01/08/2016 15.5* 3.9 - 10.3 10e3/uL Final  . NEUT# 01/08/2016 13.0* 1.5 - 6.5 10e3/uL Final  . HGB 01/08/2016 9.9* 11.6 - 15.9 g/dL Final  . HCT 01/08/2016 30.2* 34.8 - 46.6 % Final  . Platelets 01/08/2016 242  145 - 400 10e3/uL Final  . MCV 01/08/2016 89.6  79.5 - 101.0 fL Final  . MCH 01/08/2016 29.5  25.1 - 34.0 pg Final  . MCHC 01/08/2016 32.9  31.5 - 36.0 g/dL Final  . RBC 01/08/2016 3.37* 3.70 - 5.45 10e6/uL Final  . RDW  01/08/2016 13.5  11.2 - 14.5 % Final  . lymph# 01/08/2016 0.9  0.9 - 3.3 10e3/uL Final  . MONO# 01/08/2016 1.5* 0.1 - 0.9 10e3/uL Final  . Eosinophils Absolute 01/08/2016 0.1  0.0 - 0.5 10e3/uL Final  . Basophils Absolute 01/08/2016 0.1  0.0 - 0.1 10e3/uL Final  . NEUT% 01/08/2016 84.0* 38.4 - 76.8 % Final  . LYMPH% 01/08/2016 5.5* 14.0 - 49.7 % Final  . MONO% 01/08/2016 9.6  0.0 - 14.0 % Final  . EOS% 01/08/2016 0.5  0.0 - 7.0 % Final  . BASO% 01/08/2016 0.4  0.0 - 2.0 % Final  . Sodium 01/08/2016 122 Repeated and Verified* 136 - 145 mEq/L Final  . Potassium 01/08/2016 4.7  3.5 - 5.1 mEq/L Final  . Chloride 01/08/2016 96* 98 - 109 mEq/L Final  . CO2 01/08/2016 16* 22 - 29 mEq/L Final  . Glucose 01/08/2016 260* 70 - 140 mg/dl Final   Glucose reference range is for nonfasting patients. Fasting glucose reference range is 70- 100.  Marland Kitchen BUN 01/08/2016 30.0* 7.0 - 26.0 mg/dL Final  . Creatinine 01/08/2016 1.6* 0.6 - 1.1 mg/dL Final  . Total Bilirubin 01/08/2016 0.71  0.20 - 1.20 mg/dL Final  . Alkaline Phosphatase 01/08/2016 73  40 - 150 U/L Final  . AST 01/08/2016 16  5 - 34 U/L Final  . ALT 01/08/2016 11  0 - 55 U/L Final  . Total Protein 01/08/2016 5.1* 6.4 - 8.3 g/dL Final  . Albumin 01/08/2016 1.7* 3.5 - 5.0 g/dL Final  . Calcium 01/08/2016 8.3* 8.4 - 10.4 mg/dL Final  . Anion Gap 01/08/2016 10  3 -  11 mEq/L Final  . EGFR 01/08/2016 33* >90 ml/min/1.73 m2 Final   eGFR is calculated using the CKD-EPI Creatinine Equation (2009)  Admission on 01/04/2016, Discharged on 01/07/2016  Component Date Value Ref Range Status  . Sodium 01/04/2016 123* 135 - 145 mmol/L Final  . Potassium 01/04/2016 4.3  3.5 - 5.1 mmol/L Final  . Chloride 01/04/2016 98* 101 - 111 mmol/L Final  . CO2 01/04/2016 19* 22 - 32 mmol/L Final  . Glucose, Bld 01/04/2016 177* 65 - 99 mg/dL Final  . BUN 01/04/2016 19  6 - 20 mg/dL Final  . Creatinine, Ser 01/04/2016 1.40* 0.44 - 1.00 mg/dL Final  . Calcium 01/04/2016 7.3*  8.9 - 10.3 mg/dL Final  . Total Protein 01/04/2016 4.2* 6.5 - 8.1 g/dL Final  . Albumin 01/04/2016 1.9* 3.5 - 5.0 g/dL Final  . AST 01/04/2016 17  15 - 41 U/L Final  . ALT 01/04/2016 9* 14 - 54 U/L Final  . Alkaline Phosphatase 01/04/2016 37* 38 - 126 U/L Final  . Total Bilirubin 01/04/2016 0.7  0.3 - 1.2 mg/dL Final  . GFR calc non Af Amer 01/04/2016 38* >60 mL/min Final  . GFR calc Af Amer 01/04/2016 45* >60 mL/min Final   Comment: (NOTE) The eGFR has been calculated using the CKD EPI equation. This calculation has not been validated in all clinical situations. eGFR's persistently <60 mL/min signify possible Chronic Kidney Disease.   . Anion gap 01/04/2016 6  5 - 15 Final  . Alcohol, Ethyl (B) 01/04/2016 <5  <5 mg/dL Final   Comment:        LOWEST DETECTABLE LIMIT FOR SERUM ALCOHOL IS 5 mg/dL FOR MEDICAL PURPOSES ONLY   . WBC 01/04/2016 14.9* 4.0 - 10.5 K/uL Final  . RBC 01/04/2016 3.83* 3.87 - 5.11 MIL/uL Final  . Hemoglobin 01/04/2016 11.6* 12.0 - 15.0 g/dL Final  . HCT 01/04/2016 32.7* 36.0 - 46.0 % Final  . MCV 01/04/2016 85.4  78.0 - 100.0 fL Final  . MCH 01/04/2016 30.3  26.0 - 34.0 pg Final  . MCHC 01/04/2016 35.5  30.0 - 36.0 g/dL Final  . RDW 01/04/2016 13.1  11.5 - 15.5 % Final  . Platelets 01/04/2016 215  150 - 400 K/uL Final  . Neutrophils Relative % 01/04/2016 79   Final  . Neutro Abs 01/04/2016 11.7* 1.7 - 7.7 K/uL Final  . Lymphocytes Relative 01/04/2016 9   Final  . Lymphs Abs 01/04/2016 1.3  0.7 - 4.0 K/uL Final  . Monocytes Relative 01/04/2016 12   Final  . Monocytes Absolute 01/04/2016 1.8* 0.1 - 1.0 K/uL Final  . Eosinophils Relative 01/04/2016 0   Final  . Eosinophils Absolute 01/04/2016 0.1  0.0 - 0.7 K/uL Final  . Basophils Relative 01/04/2016 0   Final  . Basophils Absolute 01/04/2016 0.0  0.0 - 0.1 K/uL Final  . Lactic Acid, Venous 01/04/2016 2.05* 0.5 - 2.0 mmol/L Final  . Comment 01/04/2016 NOTIFIED PHYSICIAN   Final  . Color, Urine 01/05/2016  YELLOW  YELLOW Final  . APPearance 01/05/2016 CLOUDY* CLEAR Final  . Specific Gravity, Urine 01/05/2016 1.028  1.005 - 1.030 Final  . pH 01/05/2016 6.0  5.0 - 8.0 Final  . Glucose, UA 01/05/2016 NEGATIVE  NEGATIVE mg/dL Final  . Hgb urine dipstick 01/05/2016 NEGATIVE  NEGATIVE Final  . Bilirubin Urine 01/05/2016 NEGATIVE  NEGATIVE Final  . Ketones, ur 01/05/2016 NEGATIVE  NEGATIVE mg/dL Final  . Protein, ur 01/05/2016 100* NEGATIVE mg/dL Final  . Nitrite  01/05/2016 NEGATIVE  NEGATIVE Final  . Leukocytes, UA 01/05/2016 NEGATIVE  NEGATIVE Final  . Sodium, Ur 01/05/2016 27   Final   Performed at [facility]  . Creatinine, Urine 01/05/2016 232.45   Final   Performed at [facility]  . Osmolality, Ur 01/05/2016 776  300 - 900 mOsm/kg Final   Performed at [facility]  . Squamous Epithelial / LPF 01/05/2016 6-30* NONE SEEN Final  . WBC, UA 01/05/2016 0-5  0 - 5 WBC/hpf Final  . RBC / HPF 01/05/2016 NONE SEEN  0 - 5 RBC/hpf Final  . Bacteria, UA 01/05/2016 RARE* NONE SEEN Final  . Casts 01/05/2016 HYALINE CASTS* NEGATIVE Final  . Lactic Acid, Venous 01/05/2016 1.4  0.5 - 2.0 mmol/L Final  . Lactic Acid, Venous 01/05/2016 1.4  0.5 - 2.0 mmol/L Final  . Specimen Description 01/05/2016 BLOOD LEFT ANTECUBITAL   Final  . Special Requests 01/05/2016 IN PEDIATRIC BOTTLE 4ML   Final  . Culture 01/05/2016    Final                   Value:NO GROWTH 5 DAYS Performed at [facility]   . Report Status 01/05/2016 01/10/2016 FINAL   Final  . Specimen Description 01/05/2016 BLOOD BLOOD LEFT FOREARM   Final  . Special Requests 01/05/2016 IN PEDIATRIC BOTTLE 4ML   Final  . Culture 01/05/2016    Final                   Value:NO GROWTH 5 DAYS Performed at [facility]   . Report Status 01/05/2016 01/10/2016 FINAL   Final  . Sodium 01/05/2016 121* 135 - 145 mmol/L Final  . Potassium 01/05/2016 4.5  3.5 - 5.1 mmol/L Final  . Chloride 01/05/2016 98* 101 - 111 mmol/L  Final  . CO2 01/05/2016 17* 22 - 32 mmol/L Final  . Glucose, Bld 01/05/2016 172* 65 - 99 mg/dL Final  . BUN 01/05/2016 18  6 - 20 mg/dL Final  . Creatinine, Ser 01/05/2016 1.23* 0.44 - 1.00 mg/dL Final  . Calcium 01/05/2016 7.2* 8.9 - 10.3 mg/dL Final  . GFR calc non Af Amer 01/05/2016 45* >60 mL/min Final  . GFR calc Af Amer 01/05/2016 52* >60 mL/min Final   Comment: (NOTE) The eGFR has been calculated using the CKD EPI equation. This calculation has not been validated in all clinical situations. eGFR's persistently <60 mL/min signify possible Chronic Kidney Disease.   . Anion gap 01/05/2016 6  5 - 15 Final  . WBC 01/05/2016 15.4* 4.0 - 10.5 K/uL Final  . RBC 01/05/2016 3.65* 3.87 - 5.11 MIL/uL Final  . Hemoglobin 01/05/2016 11.2* 12.0 - 15.0 g/dL Final  . HCT 01/05/2016 30.9* 36.0 - 46.0 % Final  . MCV 01/05/2016 84.7  78.0 - 100.0 fL Final  . MCH 01/05/2016 30.7  26.0 - 34.0 pg Final  . MCHC 01/05/2016 36.2* 30.0 - 36.0 g/dL Final  . RDW 01/05/2016 13.0  11.5 - 15.5 % Final  . Platelets 01/05/2016 203  150 - 400 K/uL Final  . Osmolality 01/05/2016 252* 275 - 295 mOsm/kg Final   Performed at [facility] 01/05/2016 143* 65 - 99 mg/dL Final  . Glucose-Capillary 01/05/2016 146* 65 - 99 mg/dL Final  . Glucose-Capillary 01/05/2016 186* 65 - 99 mg/dL Final  . Sodium 01/06/2016 120* 135 - 145 mmol/L Final  . Potassium 01/06/2016 4.2  3.5 - 5.1 mmol/L Final  .  Chloride 01/06/2016 97* 101 - 111 mmol/L Final  . CO2 01/06/2016 18* 22 - 32 mmol/L Final  . Glucose, Bld 01/06/2016 174* 65 - 99 mg/dL Final  . BUN 01/06/2016 22* 6 - 20 mg/dL Final  . Creatinine, Ser 01/06/2016 1.41* 0.44 - 1.00 mg/dL Final  . Calcium 01/06/2016 7.4* 8.9 - 10.3 mg/dL Final  . GFR calc non Af Amer 01/06/2016 38* >60 mL/min Final  . GFR calc Af Amer 01/06/2016 44* >60 mL/min Final   Comment: (NOTE) The eGFR has been calculated using the CKD EPI equation. This calculation has not  been validated in all clinical situations. eGFR's persistently <60 mL/min signify possible Chronic Kidney Disease.   . Anion gap 01/06/2016 5  5 - 15 Final  . Glucose-Capillary 01/05/2016 219* 65 - 99 mg/dL Final  . Comment 1 01/05/2016 Notify RN   Final  . Comment 2 01/05/2016 Document in Chart   Final  . Glucose-Capillary 01/05/2016 204* 65 - 99 mg/dL Final  . Comment 1 01/05/2016 Notify RN   Final  . Glucose-Capillary 01/06/2016 168* 65 - 99 mg/dL Final  . Sodium 01/06/2016 119* 135 - 145 mmol/L Final   Comment: CRITICAL RESULT CALLED TO, READ BACK BY AND VERIFIED WITH: D.TORRES AT 2671 ON 01/06/16 BY S.VANHOORNE   . Potassium 01/06/2016 4.2  3.5 - 5.1 mmol/L Final  . Chloride 01/06/2016 96* 101 - 111 mmol/L Final  . CO2 01/06/2016 16* 22 - 32 mmol/L Final  . Glucose, Bld 01/06/2016 199* 65 - 99 mg/dL Final  . BUN 01/06/2016 24* 6 - 20 mg/dL Final  . Creatinine, Ser 01/06/2016 1.43* 0.44 - 1.00 mg/dL Final  . Calcium 01/06/2016 7.2* 8.9 - 10.3 mg/dL Final  . GFR calc non Af Amer 01/06/2016 38* >60 mL/min Final  . GFR calc Af Amer 01/06/2016 43* >60 mL/min Final   Comment: (NOTE) The eGFR has been calculated using the CKD EPI equation. This calculation has not been validated in all clinical situations. eGFR's persistently <60 mL/min signify possible Chronic Kidney Disease.   . Anion gap 01/06/2016 7  5 - 15 Final  . Sodium 01/06/2016 118* 135 - 145 mmol/L Final   Comment: CRITICAL RESULT CALLED TO, READ BACK BY AND VERIFIED WITH: TORRES RN AT 1706 ON 01/06/16 BY S.VANHOORNE   . Glucose-Capillary 01/06/2016 240* 65 - 99 mg/dL Final  . Sodium 01/07/2016 120* 135 - 145 mmol/L Final  . WBC 01/07/2016 13.8* 4.0 - 10.5 K/uL Final  . RBC 01/07/2016 3.24* 3.87 - 5.11 MIL/uL Final  . Hemoglobin 01/07/2016 9.9* 12.0 - 15.0 g/dL Final  . HCT 01/07/2016 27.6* 36.0 - 46.0 % Final  . MCV 01/07/2016 85.2  78.0 - 100.0 fL Final  . MCH 01/07/2016 30.6  26.0 - 34.0 pg Final  . MCHC 01/07/2016  35.9  30.0 - 36.0 g/dL Final  . RDW 01/07/2016 13.0  11.5 - 15.5 % Final  . Platelets 01/07/2016 234  150 - 400 K/uL Final  . Sodium 01/07/2016 121* 135 - 145 mmol/L Final  . Potassium 01/07/2016 4.2  3.5 - 5.1 mmol/L Final  . Chloride 01/07/2016 96* 101 - 111 mmol/L Final  . CO2 01/07/2016 19* 22 - 32 mmol/L Final  . Glucose, Bld 01/07/2016 182* 65 - 99 mg/dL Final  . BUN 01/07/2016 28* 6 - 20 mg/dL Final  . Creatinine, Ser 01/07/2016 1.48* 0.44 - 1.00 mg/dL Final  . Calcium 01/07/2016 7.7* 8.9 - 10.3 mg/dL Final  . GFR calc non Af Wyvonnia Lora  01/07/2016 36* >60 mL/min Final  . GFR calc Af Amer 01/07/2016 42* >60 mL/min Final   Comment: (NOTE) The eGFR has been calculated using the CKD EPI equation. This calculation has not been validated in all clinical situations. eGFR's persistently <60 mL/min signify possible Chronic Kidney Disease.   . Anion gap 01/07/2016 6  5 - 15 Final  . Glucose-Capillary 01/06/2016 192* 65 - 99 mg/dL Final  . Glucose-Capillary 01/06/2016 294* 65 - 99 mg/dL Final  . Comment 1 01/06/2016 Notify RN   Final  . Glucose-Capillary 01/07/2016 219* 65 - 99 mg/dL Final  . Sodium 01/07/2016 124* 135 - 145 mmol/L Final  . Glucose-Capillary 01/07/2016 224* 65 - 99 mg/dL Final  Admission on 12/29/2015, Discharged on 01/04/2016  No results displayed because visit has over 200 results.    Appointment on 11/20/2015  Component Date Value Ref Range Status  . WBC 11/20/2015 6.5  3.9 - 10.3 10e3/uL Final  . NEUT# 11/20/2015 3.8  1.5 - 6.5 10e3/uL Final  . HGB 11/20/2015 9.6* 11.6 - 15.9 g/dL Final  . HCT 11/20/2015 29.1* 34.8 - 46.6 % Final  . Platelets 11/20/2015 165  145 - 400 10e3/uL Final  . MCV 11/20/2015 91.2  79.5 - 101.0 fL Final  . MCH 11/20/2015 30.1  25.1 - 34.0 pg Final  . MCHC 11/20/2015 33.0  31.5 - 36.0 g/dL Final  . RBC 11/20/2015 3.19* 3.70 - 5.45 10e6/uL Final  . RDW 11/20/2015 13.2  11.2 - 14.5 % Final  . lymph# 11/20/2015 1.9  0.9 - 3.3 10e3/uL Final  .  MONO# 11/20/2015 0.6  0.1 - 0.9 10e3/uL Final  . Eosinophils Absolute 11/20/2015 0.1  0.0 - 0.5 10e3/uL Final  . Basophils Absolute 11/20/2015 0.0  0.0 - 0.1 10e3/uL Final  . NEUT% 11/20/2015 58.1  38.4 - 76.8 % Final  . LYMPH% 11/20/2015 29.3  14.0 - 49.7 % Final  . MONO% 11/20/2015 10.0  0.0 - 14.0 % Final  . EOS% 11/20/2015 1.9  0.0 - 7.0 % Final  . BASO% 11/20/2015 0.7  0.0 - 2.0 % Final  . CA 125 11/20/2015 13  <35 U/mL Final   Comment:   This test was performed using the Beckman Coulter chemiluminescent method.  Values obtained from different assay methods cannot be used interchangeably.  CA125 levels , regardless of value, should not be interpreted as absolute evidence of the presence or absence of disease.   . Sodium 11/20/2015 137  136 - 145 mEq/L Final  . Potassium 11/20/2015 4.5  3.5 - 5.1 mEq/L Final  . Chloride 11/20/2015 104  98 - 109 mEq/L Final  . CO2 11/20/2015 24  22 - 29 mEq/L Final  . Glucose 11/20/2015 211* 70 - 140 mg/dl Final   Glucose reference range is for nonfasting patients. Fasting glucose reference range is 70- 100.  Marland Kitchen BUN 11/20/2015 12.1  7.0 - 26.0 mg/dL Final  . Creatinine 11/20/2015 0.9  0.6 - 1.1 mg/dL Final  . Total Bilirubin 11/20/2015 0.39  0.20 - 1.20 mg/dL Final  . Alkaline Phosphatase 11/20/2015 64  40 - 150 U/L Final  . AST 11/20/2015 16  5 - 34 U/L Final  . ALT 11/20/2015 17  0 - 55 U/L Final  . Total Protein 11/20/2015 6.8  6.4 - 8.3 g/dL Final  . Albumin 11/20/2015 3.8  3.5 - 5.0 g/dL Final  . Calcium 11/20/2015 9.1  8.4 - 10.4 mg/dL Final  . Anion Gap 11/20/2015 10  3 - 11  mEq/L Final  . EGFR 11/20/2015 68* >90 ml/min/1.73 m2 Final   eGFR is calculated using the CKD-EPI Creatinine Equation (2009)    Ct Abdomen Pelvis Wo Contrast  01/11/2016  CLINICAL DATA:  Advanced endometrial cancer with peritoneal spread. Recent transfusion for anemia, subsequently developing shortness of breath. EXAM: CT CHEST, ABDOMEN AND PELVIS WITHOUT CONTRAST  TECHNIQUE: Multidetector CT imaging of the chest, abdomen and pelvis was performed following the standard protocol without IV contrast. COMPARISON:  CT abdomen pelvis with contrast 01/08/2016. FINDINGS: CT CHEST FINDINGS Patient has been intubated. Endotracheal tube is near carina and should be withdrawn 3-4 cm. Port-A-Cath tip unchanged proximal RIGHT atrium. New LEFT IJ dual-lumen venous catheter distal SVC. No pneumothorax. Mediastinum/Lymph Nodes: No masses or pathologically enlarged lymph nodes identified on this un-enhanced exam. Lungs/Pleura: BILATERAL pleural effusions, larger on the RIGHT. Suspected LEFT greater than RIGHT lower lobe infiltrates. Perihilar opacities suggesting superimposed pulmonary edema. Musculoskeletal: No chest wall mass or suspicious bone lesions identified. CT ABDOMEN PELVIS FINDINGS Hepatobiliary: No mass visualized on this un-enhanced exam. Pancreas: No mass or inflammatory process identified on this un-enhanced exam. Spleen: Within normal limits in size. Adrenals/Urinary Tract: LEFT hydronephrosis redemonstrated. BILATERAL contrast nephrograms persists from the February 6 scan indicating acute renal failure. Large pelvic mass. Extrinsic mass effect on the bladder with posterior wall invasion and LEFT UVJ mass effect contributes to hydronephrosis. Stomach/Bowel: No evidence of obstruction, inflammatory process, or abnormal fluid collections. Vascular/Lymphatic: No pathologically enlarged lymph nodes. No evidence of abdominal aortic aneurysm. Reproductive: Status post hysterectomy. Widespread omental tumor. Marked worsening of ascites. Other: None. Musculoskeletal:  No suspicious bone lesions identified. IMPRESSION: Marked worsening aeration since priors. Pulmonary edema with superimposed lower lobe infiltrates, LEFT greater than RIGHT. Endotracheal tube too low.  Withdrawal 3-4 cm. Marked worsening ascites. Pelvic tumor redemonstrated with bladder invasion. Persistent contrast  nephrograms with LEFT hydronephrosis. Electronically Signed   By: Staci Righter M.D.   On: 01/11/2016 21:56   Dg Abd 1 View  01/12/2016  CLINICAL DATA:  65 year old female - NG tube advancement. EXAM: ABDOMEN - 1 VIEW COMPARISON:  01/12/2016 FINDINGS: An NG tube is identified with tip overlying the distal stomach. No other significant abnormalities noted. IMPRESSION: NG tube with tip overlying the distal stomach. Electronically Signed   By: Margarette Canada M.D.   On: 01/12/2016 18:32   Ct Chest Wo Contrast  01/11/2016  CLINICAL DATA:  Advanced endometrial cancer with peritoneal spread. Recent transfusion for anemia, subsequently developing shortness of breath. EXAM: CT CHEST, ABDOMEN AND PELVIS WITHOUT CONTRAST TECHNIQUE: Multidetector CT imaging of the chest, abdomen and pelvis was performed following the standard protocol without IV contrast. COMPARISON:  CT abdomen pelvis with contrast 01/08/2016. FINDINGS: CT CHEST FINDINGS Patient has been intubated. Endotracheal tube is near carina and should be withdrawn 3-4 cm. Port-A-Cath tip unchanged proximal RIGHT atrium. New LEFT IJ dual-lumen venous catheter distal SVC. No pneumothorax. Mediastinum/Lymph Nodes: No masses or pathologically enlarged lymph nodes identified on this un-enhanced exam. Lungs/Pleura: BILATERAL pleural effusions, larger on the RIGHT. Suspected LEFT greater than RIGHT lower lobe infiltrates. Perihilar opacities suggesting superimposed pulmonary edema. Musculoskeletal: No chest wall mass or suspicious bone lesions identified. CT ABDOMEN PELVIS FINDINGS Hepatobiliary: No mass visualized on this un-enhanced exam. Pancreas: No mass or inflammatory process identified on this un-enhanced exam. Spleen: Within normal limits in size. Adrenals/Urinary Tract: LEFT hydronephrosis redemonstrated. BILATERAL contrast nephrograms persists from the February 6 scan indicating acute renal failure. Large pelvic mass. Extrinsic mass effect on the bladder with  posterior wall invasion and LEFT UVJ mass effect contributes to hydronephrosis. Stomach/Bowel: No evidence of obstruction, inflammatory process, or abnormal fluid collections. Vascular/Lymphatic: No pathologically enlarged lymph nodes. No evidence of abdominal aortic aneurysm. Reproductive: Status post hysterectomy. Widespread omental tumor. Marked worsening of ascites. Other: None. Musculoskeletal:  No suspicious bone lesions identified. IMPRESSION: Marked worsening aeration since priors. Pulmonary edema with superimposed lower lobe infiltrates, LEFT greater than RIGHT. Endotracheal tube too low.  Withdrawal 3-4 cm. Marked worsening ascites. Pelvic tumor redemonstrated with bladder invasion. Persistent contrast nephrograms with LEFT hydronephrosis. Electronically Signed   By: Staci Righter M.D.   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 Patient presents with decreased hemoglobin and significant bruising. EXAM: CT ABDOMEN AND PELVIS WITH CONTRAST TECHNIQUE: Multidetector CT imaging of the abdomen and pelvis was performed using the standard protocol following bolus administration of intravenous contrast. CONTRAST:  133m OMNIPAQUE IOHEXOL 300 MG/ML  SOLN COMPARISON:  12/29/2015 CT abdomen/ pelvis. FINDINGS: Lower chest: Small layering right pleural effusion, new. Trace layering left pleural effusion, new. Associated passive atelectasis in the dependent lower lobes, right greater than left. The tip of a central venous catheter is seen at the cavoatrial junction. Hepatobiliary: Normal liver with no liver mass. Normal gallbladder with no radiopaque cholelithiasis. No biliary ductal dilatation. Pancreas: Normal, with no mass or duct dilation. Spleen: Normal size. No mass. Adrenals/Urinary  Tract: Normal adrenals. There is stable mild left hydroureteronephrosis to the level of the obstructing deep pelvic mass. There is new hyperdense material filling the left renal collecting system and left ureter. No right hydronephrosis. Normal caliber right ureter. Subcentimeter renal cortical lesion in the lateral upper left kidney is too small to characterize and unchanged. There is a persistent delayed left contrast nephrogram. Relatively collapsed bladder. There is direct invasion of the posterior bladder wall by the infiltrative 12.9 benign 0.3 cm peripherally enhancing mass centered at the vaginal cuff (series 2/image 78), which appears increased in size from 11.0 x 8.8 cm on 12/29/2015. Stomach/Bowel: Grossly normal stomach. Normal caliber small bowel with no small bowel wall thickening. Normal appendix. Normal large bowel with no diverticulosis, large bowel wall thickening or pericolonic fat stranding. Vascular/Lymphatic: Normal caliber atherosclerotic abdominal aorta. Patent portal, splenic, hepatic and renal veins. No pathologically enlarged lymph nodes in the abdomen or pelvis. Reproductive: Status post hysterectomy. Other: There is moderate volume ascites. Extensive omental tumor caking appears slightly increased. Large peritoneal tumor implant in the right pericolic gutter measuring 7.1 x 3.9 cm (series 2/ image 61) is mildly increased from 6.1 x 3.3 cm appear there is diffuse peritoneal thickening and irregularity in the pelvis, with the dominant mass at the vaginal cuff increased in size as detailed above. No evidence of a retroperitoneal or intraperitoneal hematoma. Musculoskeletal: No aggressive appearing focal osseous lesions. Mild-to-moderate degenerative changes in the visualized thoracolumbar spine. Worsening moderate anasarca. IMPRESSION: 1. No evidence of a retroperitoneal or intraperitoneal hematoma. 2. Interval growth of dominant locally infiltrated pelvic tumor at the vaginal cuff, with  persistent obstruction of the distal left pelvic ureter with stable mild left hydroureteronephrosis. New dense material within the left urinary tract is likely to represent delayed excretion of contrast material from the prior CT study, although a component of hemorrhage within the collecting system cannot be excluded. 3. Extensive peritoneal carcinomatosis, with evidence of interval  worsening. Moderate volume ascites. 4. New small right and trace left pleural effusions and worsening moderate anasarca. Electronically Signed   By: Ilona Sorrel M.D.   On: 01/08/2016 16:19   US Abdomen Limited  01/16/2016  CLINICAL DATA:  Current history of metastatic endometrial cancer with peritoneal and omental metastatic disease. Abdominal distension. Evaluate for possible paracentesis. EXAM: LIMITED ABDOMEN ULTRASOUND FOR ASCITES TECHNIQUE: Limited ultrasound survey for ascites was performed in all four abdominal quadrants. COMPARISON:  CT abdomen and pelvis 01/11/2016. FINDINGS: A small amount of ascites is present in all 4 quadrants of the abdomen, though none of the pockets of fluid are large enough for paracentesis currently. IMPRESSION: Small amount of ascites. None of the pockets of fluid are large enough for paracentesis currently. Electronically Signed   By: Evangeline Dakin M.D.   On: 01/16/2016 16:00   US Paracentesis  01/09/2016  INDICATION: Endometrial cancer with recurrent ascites. Request is made for therapeutic paracentesis. EXAM: ULTRASOUND GUIDED left mid quadrant PARACENTESIS MEDICATIONS: 1% lidocaine COMPLICATIONS: None immediate. PROCEDURE: Informed written consent was obtained from the patient after a discussion of the risks, benefits and alternatives to treatment. A timeout was performed prior to the initiation of the procedure. Initial ultrasound scanning demonstrates a small to moderate amount of ascites within the left mid abdominal quadrant. The left mid abdomen was prepped and draped in the usual  sterile fashion. 1% lidocaine was used for local anesthesia. Following this, a Yueh catheter was introduced. An ultrasound image was saved for documentation purposes. The paracentesis was performed. The catheter was removed and a dressing was applied. The patient tolerated the procedure well without immediate post procedural complication. FINDINGS: A total of approximately 4 of bloody fluid was removed. IMPRESSION: Successful ultrasound-guided paracentesis yielding 4 liters of peritoneal fluid. Read by: Saverio Danker, PA-C Electronically Signed   By: Corrie Mckusick D.O.   On: 01/09/2016 12:13   Dg Chest Port 1 View  01/26/2016  CLINICAL DATA:  Coughing with phlegm and mid chest pain for 3-4 days. EXAM: PORTABLE CHEST 1 VIEW COMPARISON:  01/13/2016 FINDINGS: 1614 hours. Endotracheal tube has been removed in the interval. The right Port-A-Cath tip remains positioned over the proximal mid SVC. The tip of the left IJ central line is positioned in the proximal SVC, just below the innominate vein confluence. The lungs are clear wiithout focal pneumonia, edema, pneumothorax or pleural effusion. The cardiopericardial silhouette is within normal limits for size. Question tiny left pleural effusion. The visualized bony structures of the thorax are intact. IMPRESSION: 1. Interval extubation. 2. Question tiny left pleural effusion. Electronically Signed   By: Misty Stanley M.D.   On: 01/26/2016 16:29   Dg Chest Port 1 View  01/13/2016  CLINICAL DATA:  Hypoxia EXAM: PORTABLE CHEST 1 VIEW COMPARISON:  January 12, 2016 FINDINGS: Endotracheal tube tip is 3.3 cm above the carina. Dual-lumen central catheter tip in superior vena cava. Port-A-Cath tip in superior vena cava. Nasogastric tube tip and side port in stomach. No pneumothorax. There is slight atelectasis in the lung bases. Lungs elsewhere clear. Heart size and pulmonary vascularity are normal. No adenopathy. There is degenerative change in the thoracic spine.  IMPRESSION: Tube and catheter positions as described without pneumothorax. Lungs are clear except for slight bibasilar atelectasis. Cardiac silhouette within normal limits. Electronically Signed   By: Lowella Grip III M.D.   On: 01/13/2016 07:28   Dg Chest Port 1 View  01/12/2016  CLINICAL DATA:  65 year old female with respiratory failure. Metastatic  endometrial cancer. Shortness of breath, intubated. Initial encounter. EXAM: PORTABLE CHEST 1 VIEW COMPARISON:  CT chest abdomen and pelvis 01/11/2016 and earlier. FINDINGS: Portable AP semi upright view at 0454 hours. Endotracheal tube tip now projects in good position just below the clavicles. Right chest porta cath remains in place. Left IJ approach dual lumen dialysis type catheter at the lower SVC level. Normal cardiac size and mediastinal contours. No pneumothorax or pulmonary edema. Confluent bilateral lower lobe consolidation is less apparent than on yesterday CT. Small right pleural effusion on that exam also is not evident today. No areas of worsening ventilation. IMPRESSION: 1. Endotracheal tube tip in good position at the level the clavicles. 2.  Otherwise, stable lines and tubes. 3. No new cardiopulmonary finding. Bilateral lower lobe consolidation compatible with pneumonia. Small right pleural effusion seen yesterday not evident radiographically. Electronically Signed   By: Genevie Ann M.D.   On: 01/12/2016 06:57   Dg Chest Port 1 View  01/11/2016  CLINICAL DATA:  Status post intubation and central line placement EXAM: PORTABLE CHEST 1 VIEW COMPARISON:  January 11, 2016 FINDINGS: Endotracheal tube is identified with distal tip 1.4 cm from carina. Retraction by 2.5 cm is recommended. Dual lumen left jugular central venous line are identified with distal tip in the superior vena cava. Right central venous line is identified with distal tip in the superior vena cava unchanged. There is pulmonary edema. There is no pleural effusion or focal pneumonia.  There is no pneumothorax. The osseous structures are stable. IMPRESSION: Endotracheal tube distal tip 1.5 cm from carina. Retraction by 2.5 cm is recommended. Left central venous line distal tips in the superior vena cava. No pneumothorax. Pulmonary edema. These results will be called to the ordering clinician or representative by the Radiologist Assistant, and communication documented in the PACS or z[employer]ision Dashboard. Electronically Signed   By: Abelardo Diesel M.D.   On: 01/11/2016 21:13   Dg Chest Portable 1 View  01/11/2016  CLINICAL DATA:  History of uterine cancer with abdominal ascites. Patient became hypertensive and diaphoretic during blood transfusion. EXAM: PORTABLE CHEST 1 VIEW COMPARISON:  CT of the chest 01/13/2015 FINDINGS: Right internal jugular approach injectable port terminates within the expected location of superior vena cava. Cardiomediastinal silhouette is normal. Mediastinal contours appear intact. There is no evidence of pneumothorax. Lung volumes are low. There is bilateral interstitial thickening which may be seen with pulmonary vascular congestion. More confluent left lower lobe airspace consolidation cannot be excluded. Osseous structures are without acute abnormality. Soft tissues are grossly normal. IMPRESSION: Low lung volumes with bilateral interstitial thickening, likely representing pulmonary vascular congestion. More confluent left lower lobe airspace consolidation cannot be excluded. Electronically Signed   By: Fidela Salisbury M.D.   On: 01/11/2016 16:51   Dg Abd Portable 1v  01/12/2016  CLINICAL DATA:  NG tube placement. EXAM: PORTABLE ABDOMEN - 1 VIEW COMPARISON:  CT of the abdomen pelvis 01/10/2014 FINDINGS: Enteric catheter tip is seen overlying the mid upper abdomen, location is uncertain as no gastric bubble is seen, however it overlies the expected location of the proximal stomach. There is nonobstructive bowel gas pattern. Hyperdense renal shadows are seen.  Bridging osteophytes of the lumbosacral spine are noted. IMPRESSION: Enteric catheter overlies expected location of the proximal stomach. Its location is uncertain as no gastric bubble is visualized. Nonobstructive bowel gas pattern. Hyperdense appearance of the renal shadows may represent retention of contrast from CT performed 15 hours ago, indicative of renal function impairment. Electronically  Signed   By: Fidela Salisbury M.D.   On: 01/12/2016 11:48     Assessment/Plan

## 2016-02-05 NOTE — Progress Notes (Signed)
Patient ID: Brittany Schwartz, female   DOB: 01-Jun-1951, 65 y.o.   MRN: [id number]    HISTORY AND PHYSICAL   DATE: 02/05/16  Location:  [facility]    Place of Service: SNF (31)   Extended Emergency Contact Information Primary Emergency Contact: Dayley,Sonal Address: [address], [place] Phone: [phone] Mobile Phone: [phone] Relation: Daughter  Advanced Directive information Does patient have an advance directive?: Yes, Type of Advance Directive: Living will, Does patient want to make changes to advanced directive?: No - Patient declined  Chief Complaint  Patient presents with  . New Admit To SNF    HPI:  65 yo female seen today as a transfer from [facility]. She was hospitalized last month with acute respiratory distress with hypoxemia and septic shock due to b/l pneumonia complicated by AKI. She has high grade endometrial cancer and also malignant ascites. She rec'd 2 units PRBCs prior to hospital presentation for chemo induced anemia. Cr 3.61 with K 7.2 and Na 119. She had to be intubated and was also given CVVHD -->2/20th. Ureteral stent placed due to left UVJ mass and hydronephrosis. She completed 10 days of vanco and zosyn. She req'd pressor support and was d/c'd on midodrine. Cr 2.82 on d/c. She was given stress dose steroids and cortisol 27.9 on 2/22nd. Demand ischemia noted with EF 65-70% and grade 1 diastolic dysfunction. Pt was seen by palliative care  She c/o diarrhea that is foul smelling today but reports it is gradually improving. She has increased SOB and abdominal distension. She was told she may therapeutic paracentesis with max 2 L removal due to reduced renal fxn. Her daughter and son-in-law are present. She is a poor historian due SOB. Hx obtained from daughter and chart  Endometrial cancer - has rapid recurrent disease (bulky mets involving abdomen/pelvis) since mid January. Completed  adjuvant chemotx 06/06/15 and now has ongoing chemotx. Last dose 01/10/16. She states she does not want anymore chemotx. She has ativan and zofran for nausea. Pain stable on oxycodone IR. she has neuropathy. Followed by oncology Dr Marko Plume. She has a port-a-cath  DM - she had frequent low BS reactions since arrival to SNF with readings in 30s. Glipizide stopped by on call provider last night. She is on glucophage and SSI. A1c 6.5%  Severe protein calorie malnutrition/FTT - has nutritional supplements. Albumin 1.9  Anemia - on iron supplements  Hypotension with hx HTN - stable on midodrine  Hyperlipidemia - stable on pravachol  She has eye gtts for glaucoma and vitamins/minerals.  Past Medical History  Diagnosis Date  . Hypertension   . Glaucoma   . Diabetes mellitus without complication ([place])   . Palpitations   . Muscle cramps   . Postmenopausal vaginal bleeding   . Uterine cancer [facility])     Past Surgical History  Procedure Laterality Date  . Robotic assisted total hysterectomy with bilateral salpingo oopherectomy Bilateral 01/19/2015    Procedure: XI ROBOTIC ASSISTED TOTAL HYSTERECTOMY WITH BILATERAL SALPINGO OOPHORECTOMY;  Surgeon: [name], MD;  Location: [facility];  Service: Gynecology;  Laterality: Bilateral;  . Cystoscopy w/ ureteral stent placement Bilateral 01/13/2016    Procedure: CYSTOSCOPY WITH RETROGRADE PYELOGRAM/BILATERAL URETERAL STENT PLACEMENT;  Surgeon: [name], MD;  Location: [facility];  Service: Urology;  Laterality: Bilateral;    Patient Care Team: [name], MD as PCP - General (Endocrinology)  Social History   Social  History  . Marital Status: Married    Spouse Name: N/A  . Number of Children: 2  . Years of Education: N/A   Occupational History  . Not on file.   Social History Main Topics  . Smoking status: Never Smoker   . Smokeless tobacco: Never Used  . Alcohol Use: No  . Drug Use: No  . Sexual Activity: Yes   Other Topics Concern  .  Not on file   Social History Narrative     reports that she has never smoked. She has never used smokeless tobacco. She reports that she does not drink alcohol or use illicit drugs.  Family History  Problem Relation Age of Onset  . Diabetes Father   . Arrhythmia Mother    No family status information on file.    Immunization History  Administered Date(s) Administered  . Influenza,inj,Quad PF,36+ Mos 01/08/2016    No Known Allergies  Medications: Patient's Medications  New Prescriptions   No medications on file  Previous Medications   ACETAMINOPHEN (TYLENOL) 325 MG TABLET    Take 650 mg by mouth every 6 (six) hours as needed for mild pain, moderate pain or headache. Reported on 01/08/2016   FEEDING SUPPLEMENT, GLUCERNA SHAKE, (GLUCERNA SHAKE) LIQD    Take 237 mLs by mouth daily.   FERROUS FUMARATE (HEMOCYTE - 106 MG FE) 325 (106 FE) MG TABS TABLET    Take 1 tablet daily  on an empty stomach with OJ or vitamin C tablet   INSULIN LISPRO (HUMALOG KWIKPEN) 100 UNIT/ML KIWKPEN    Inject into the skin. Inject per sliding scale: if 0-140 =0; 150-600=3, subcutaneously before meals   LATANOPROST (XALATAN) 0.005 % OPHTHALMIC SOLUTION    Place 1 drop into both eyes at bedtime.   LIDOCAINE-PRILOCAINE (EMLA) CREAM    Apply to Porta-Cath 1-2 hrs prior to access as directed.   LORAZEPAM (ATIVAN) 0.5 MG TABLET    Place 1 tablet under tongue or swallow every 8 hours as needed for nausea. Will make drowsy.   METFORMIN (GLUCOPHAGE) 1000 MG TABLET    Take 1,000 mg by mouth 2 (two) times daily with a meal.   MIDODRINE (PROAMATINE) 10 MG TABLET    Take 1 tablet (10 mg total) by mouth 3 (three) times daily with meals.   NYSTATIN (MYCOSTATIN) 100000 UNIT/ML SUSPENSION    Take 5 mLs (500,000 Units total) by mouth 4 (four) times daily.   ONDANSETRON (ZOFRAN) 8 MG TABLET    Take 1 tablet by mouth every 8 hours as needed for nausea. Will not make drowsy.   OXYCODONE (ROXICODONE) 5 MG IMMEDIATE RELEASE TABLET     Take 1 tablet every 4-6 hours as needed for pain.   PANTOPRAZOLE (PROTONIX) 40 MG TABLET    Take 1 tablet (40 mg total) by mouth daily.   POLYETHYLENE GLYCOL (MIRALAX / GLYCOLAX) PACKET    Take 17 g by mouth daily as needed for mild constipation.   PRAVASTATIN (PRAVACHOL) 20 MG TABLET    Take 20 mg by mouth daily.   TIMOLOL (BETIMOL) 0.5 % OPHTHALMIC SOLUTION    Place 1 drop into both eyes 2 (two) times daily.  Modified Medications   No medications on file  Discontinued Medications   GLIPIZIDE (GLUCOTROL XL) 5 MG 24 HR TABLET    Take 5 mg by mouth 2 (two) times daily.    Review of Systems  Unable to perform ROS: Other  pt SOB  Filed Vitals:   02/05/16 0935  BP: 106/64  Pulse: 59  Temp: 100 F (37.8 C)  TempSrc: Oral  Resp: 16  Height: 5\' 1"  (1.549 m)  SpO2: 100%   There is no weight on file to calculate BMI.  Physical Exam  Constitutional: She is oriented to person, place, and time. She appears well-developed.  Frail appearing, lying in bed with min conversational dyspnea  HENT:  Mouth/Throat: Oropharynx is clear and moist. No oropharyngeal exudate.  Eyes: Pupils are equal, round, and reactive to light. No scleral icterus.  Neck: Neck supple. Carotid bruit is not present. No tracheal deviation present. No thyromegaly present.  Cardiovascular: Normal rate, regular rhythm and intact distal pulses.  Exam reveals no gallop and no friction rub.   Murmur (1/6 SEM) heard. +3 pitting LE edema to abdomen  Pulmonary/Chest: No stridor. She is in respiratory distress (min conversational dyspnea. no accessory muscle use). She has wheezes (end expiratory). She has rales.  Port-a-cath present  Abdominal: Soft. Bowel sounds are normal. She exhibits distension, fluid wave and ascites. She exhibits no pulsatile liver, no abdominal bruit, no pulsatile midline mass and no mass. There is no hepatomegaly. There is tenderness. There is no rebound and no guarding.  Musculoskeletal: She exhibits  edema.  Lymphadenopathy:    She has no cervical adenopathy.  Neurological: She is alert and oriented to person, place, and time.  Skin: Skin is warm and dry. No rash noted.  Psychiatric: She has a normal mood and affect. Her behavior is normal.     Labs reviewed: Admission on 01/11/2016, Discharged on 01/30/2016  No results displayed because visit has over 200 results.  CBC Latest Ref Rng 01/28/2016 01/27/2016 01/26/2016  WBC 4.0 - 10.5 K/uL 14.3(H) 12.6(H) 11.0(H)  Hemoglobin 12.0 - 15.0 g/dL 7.7(L) 7.9(L) 7.6(L)  Hematocrit 36.0 - 46.0 % 23.5(L) 24.6(L) 23.5(L)  Platelets 150 - 400 K/uL 274 214 126(L)    CMP Latest Ref Rng 02/04/2016 01/30/2016 01/29/2016  Glucose 65 - 99 mg/dL - 188(H) 204(H)  BUN 4 - 21 mg/dL 32(A) 30(H) 32(H)  Creatinine .5 - 1.1 mg/dL 2.3(A) 2.82(H) 3.21(H)  Sodium 137 - 147 mmol/L 145 145 143  Potassium 3.4 - 5.3 mmol/L 5.0 3.6 3.8  Chloride 101 - 111 mmol/L - 111 107  CO2 22 - 32 mmol/L - 25 24  Calcium 8.9 - 10.3 mg/dL - 8.4(L) 8.4(L)   Lab Results  Component Value Date   HGBA1C 6.5* 01/11/2016      Ct Abdomen Pelvis Wo Contrast  01/11/2016  CLINICAL DATA:  Advanced endometrial cancer with peritoneal spread. Recent transfusion for anemia, subsequently developing shortness of breath. EXAM: CT CHEST, ABDOMEN AND PELVIS WITHOUT CONTRAST TECHNIQUE: Multidetector CT imaging of the chest, abdomen and pelvis was performed following the standard protocol without IV contrast. COMPARISON:  CT abdomen pelvis with contrast 01/08/2016. FINDINGS: CT CHEST FINDINGS Patient has been intubated. Endotracheal tube is near carina and should be withdrawn 3-4 cm. Port-A-Cath tip unchanged proximal RIGHT atrium. New LEFT IJ dual-lumen venous catheter distal SVC. No pneumothorax. Mediastinum/Lymph Nodes: No masses or pathologically enlarged lymph nodes identified on this un-enhanced exam. Lungs/Pleura: BILATERAL pleural effusions, larger on the RIGHT. Suspected LEFT greater than RIGHT  lower lobe infiltrates. Perihilar opacities suggesting superimposed pulmonary edema. Musculoskeletal: No chest wall mass or suspicious bone lesions identified. CT ABDOMEN PELVIS FINDINGS Hepatobiliary: No mass visualized on this un-enhanced exam. Pancreas: No mass or inflammatory process identified on this un-enhanced exam. Spleen: Within normal limits in size. Adrenals/Urinary Tract: LEFT hydronephrosis redemonstrated. BILATERAL  contrast nephrograms persists from the February 6 scan indicating acute renal failure. Large pelvic mass. Extrinsic mass effect on the bladder with posterior wall invasion and LEFT UVJ mass effect contributes to hydronephrosis. Stomach/Bowel: No evidence of obstruction, inflammatory process, or abnormal fluid collections. Vascular/Lymphatic: No pathologically enlarged lymph nodes. No evidence of abdominal aortic aneurysm. Reproductive: Status post hysterectomy. Widespread omental tumor. Marked worsening of ascites. Other: None. Musculoskeletal:  No suspicious bone lesions identified. IMPRESSION: Marked worsening aeration since priors. Pulmonary edema with superimposed lower lobe infiltrates, LEFT greater than RIGHT. Endotracheal tube too low.  Withdrawal 3-4 cm. Marked worsening ascites. Pelvic tumor redemonstrated with bladder invasion. Persistent contrast nephrograms with LEFT hydronephrosis. Electronically Signed   By: Staci Righter M.D.   On: 01/11/2016 21:56   Dg Abd 1 View  01/12/2016  CLINICAL DATA:  65 year old female - NG tube advancement. EXAM: ABDOMEN - 1 VIEW COMPARISON:  01/12/2016 FINDINGS: An NG tube is identified with tip overlying the distal stomach. No other significant abnormalities noted. IMPRESSION: NG tube with tip overlying the distal stomach. Electronically Signed   By: Margarette Canada M.D.   On: 01/12/2016 18:32   Ct Chest Wo Contrast  01/11/2016  CLINICAL DATA:  Advanced endometrial cancer with peritoneal spread. Recent transfusion for anemia, subsequently  developing shortness of breath. EXAM: CT CHEST, ABDOMEN AND PELVIS WITHOUT CONTRAST TECHNIQUE: Multidetector CT imaging of the chest, abdomen and pelvis was performed following the standard protocol without IV contrast. COMPARISON:  CT abdomen pelvis with contrast 01/08/2016. FINDINGS: CT CHEST FINDINGS Patient has been intubated. Endotracheal tube is near carina and should be withdrawn 3-4 cm. Port-A-Cath tip unchanged proximal RIGHT atrium. New LEFT IJ dual-lumen venous catheter distal SVC. No pneumothorax. Mediastinum/Lymph Nodes: No masses or pathologically enlarged lymph nodes identified on this un-enhanced exam. Lungs/Pleura: BILATERAL pleural effusions, larger on the RIGHT. Suspected LEFT greater than RIGHT lower lobe infiltrates. Perihilar opacities suggesting superimposed pulmonary edema. Musculoskeletal: No chest wall mass or suspicious bone lesions identified. CT ABDOMEN PELVIS FINDINGS Hepatobiliary: No mass visualized on this un-enhanced exam. Pancreas: No mass or inflammatory process identified on this un-enhanced exam. Spleen: Within normal limits in size. Adrenals/Urinary Tract: LEFT hydronephrosis redemonstrated. BILATERAL contrast nephrograms persists from the February 6 scan indicating acute renal failure. Large pelvic mass. Extrinsic mass effect on the bladder with posterior wall invasion and LEFT UVJ mass effect contributes to hydronephrosis. Stomach/Bowel: No evidence of obstruction, inflammatory process, or abnormal fluid collections. Vascular/Lymphatic: No pathologically enlarged lymph nodes. No evidence of abdominal aortic aneurysm. Reproductive: Status post hysterectomy. Widespread omental tumor. Marked worsening of ascites. Other: None. Musculoskeletal:  No suspicious bone lesions identified. IMPRESSION: Marked worsening aeration since priors. Pulmonary edema with superimposed lower lobe infiltrates, LEFT greater than RIGHT. Endotracheal tube too low.  Withdrawal 3-4 cm. Marked worsening  ascites. Pelvic tumor redemonstrated with bladder invasion. Persistent contrast nephrograms with LEFT hydronephrosis. Electronically Signed   By: Staci Righter M.D.   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 Patient presents with decreased hemoglobin and significant bruising. EXAM: CT ABDOMEN AND PELVIS WITH CONTRAST TECHNIQUE: Multidetector CT imaging of the abdomen and pelvis was performed using the standard protocol following bolus administration of intravenous contrast. CONTRAST:  164mL OMNIPAQUE IOHEXOL 300 MG/ML  SOLN COMPARISON:  12/29/2015 CT abdomen/ pelvis. FINDINGS: Lower chest: Small layering right pleural  effusion, new. Trace layering left pleural effusion, new. Associated passive atelectasis in the dependent lower lobes, right greater than left. The tip of a central venous catheter is seen at the cavoatrial junction. Hepatobiliary: Normal liver with no liver mass. Normal gallbladder with no radiopaque cholelithiasis. No biliary ductal dilatation. Pancreas: Normal, with no mass or duct dilation. Spleen: Normal size. No mass. Adrenals/Urinary Tract: Normal adrenals. There is stable mild left hydroureteronephrosis to the level of the obstructing deep pelvic mass. There is new hyperdense material filling the left renal collecting system and left ureter. No right hydronephrosis. Normal caliber right ureter. Subcentimeter renal cortical lesion in the lateral upper left kidney is too small to characterize and unchanged. There is a persistent delayed left contrast nephrogram. Relatively collapsed bladder. There is direct invasion of the posterior bladder wall by the infiltrative 12.9 benign 0.3 cm peripherally enhancing mass centered at the vaginal cuff (series 2/image 78), which  appears increased in size from 11.0 x 8.8 cm on 12/29/2015. Stomach/Bowel: Grossly normal stomach. Normal caliber small bowel with no small bowel wall thickening. Normal appendix. Normal large bowel with no diverticulosis, large bowel wall thickening or pericolonic fat stranding. Vascular/Lymphatic: Normal caliber atherosclerotic abdominal aorta. Patent portal, splenic, hepatic and renal veins. No pathologically enlarged lymph nodes in the abdomen or pelvis. Reproductive: Status post hysterectomy. Other: There is moderate volume ascites. Extensive omental tumor caking appears slightly increased. Large peritoneal tumor implant in the right pericolic gutter measuring 7.1 x 3.9 cm (series 2/ image 61) is mildly increased from 6.1 x 3.3 cm appear there is diffuse peritoneal thickening and irregularity in the pelvis, with the dominant mass at the vaginal cuff increased in size as detailed above. No evidence of a retroperitoneal or intraperitoneal hematoma. Musculoskeletal: No aggressive appearing focal osseous lesions. Mild-to-moderate degenerative changes in the visualized thoracolumbar spine. Worsening moderate anasarca. IMPRESSION: 1. No evidence of a retroperitoneal or intraperitoneal hematoma. 2. Interval growth of dominant locally infiltrated pelvic tumor at the vaginal cuff, with persistent obstruction of the distal left pelvic ureter with stable mild left hydroureteronephrosis. New dense material within the left urinary tract is likely to represent delayed excretion of contrast material from the prior CT study, although a component of hemorrhage within the collecting system cannot be excluded. 3. Extensive peritoneal carcinomatosis, with evidence of interval worsening. Moderate volume ascites. 4. New small right and trace left pleural effusions and worsening moderate anasarca. Electronically Signed   By: Ilona Sorrel M.D.   On: 01/08/2016 16:19   US Abdomen Limited  01/16/2016  CLINICAL DATA:  Current history  of metastatic endometrial cancer with peritoneal and omental metastatic disease. Abdominal distension. Evaluate for possible paracentesis. EXAM: LIMITED ABDOMEN ULTRASOUND FOR ASCITES TECHNIQUE: Limited ultrasound survey for ascites was performed in all four abdominal quadrants. COMPARISON:  CT abdomen and pelvis 01/11/2016. FINDINGS: A small amount of ascites is present in all 4 quadrants of the abdomen, though none of the pockets of fluid are large enough for paracentesis currently. IMPRESSION: Small amount of ascites. None of the pockets of fluid are large enough for paracentesis currently. Electronically Signed   By: Evangeline Dakin M.D.   On: 01/16/2016 16:00   US Paracentesis  01/09/2016  INDICATION: Endometrial cancer with recurrent ascites. Request is made for therapeutic paracentesis. EXAM: ULTRASOUND GUIDED left mid quadrant PARACENTESIS MEDICATIONS: 1% lidocaine COMPLICATIONS: None immediate. PROCEDURE: Informed written consent was obtained from the patient after a discussion of the risks, benefits and alternatives to treatment. A timeout was performed prior  to the initiation of the procedure. Initial ultrasound scanning demonstrates a small to moderate amount of ascites within the left mid abdominal quadrant. The left mid abdomen was prepped and draped in the usual sterile fashion. 1% lidocaine was used for local anesthesia. Following this, a Yueh catheter was introduced. An ultrasound image was saved for documentation purposes. The paracentesis was performed. The catheter was removed and a dressing was applied. The patient tolerated the procedure well without immediate post procedural complication. FINDINGS: A total of approximately 4 of bloody fluid was removed. IMPRESSION: Successful ultrasound-guided paracentesis yielding 4 liters of peritoneal fluid. Read by: Saverio Danker, PA-C Electronically Signed   By: Corrie Mckusick D.O.   On: 01/09/2016 12:13   Dg Chest Port 1 View  01/26/2016  CLINICAL  DATA:  Coughing with phlegm and mid chest pain for 3-4 days. EXAM: PORTABLE CHEST 1 VIEW COMPARISON:  01/13/2016 FINDINGS: 1614 hours. Endotracheal tube has been removed in the interval. The right Port-A-Cath tip remains positioned over the proximal mid SVC. The tip of the left IJ central line is positioned in the proximal SVC, just below the innominate vein confluence. The lungs are clear wiithout focal pneumonia, edema, pneumothorax or pleural effusion. The cardiopericardial silhouette is within normal limits for size. Question tiny left pleural effusion. The visualized bony structures of the thorax are intact. IMPRESSION: 1. Interval extubation. 2. Question tiny left pleural effusion. Electronically Signed   By: Misty Stanley M.D.   On: 01/26/2016 16:29   Dg Chest Port 1 View  01/13/2016  CLINICAL DATA:  Hypoxia EXAM: PORTABLE CHEST 1 VIEW COMPARISON:  January 12, 2016 FINDINGS: Endotracheal tube tip is 3.3 cm above the carina. Dual-lumen central catheter tip in superior vena cava. Port-A-Cath tip in superior vena cava. Nasogastric tube tip and side port in stomach. No pneumothorax. There is slight atelectasis in the lung bases. Lungs elsewhere clear. Heart size and pulmonary vascularity are normal. No adenopathy. There is degenerative change in the thoracic spine. IMPRESSION: Tube and catheter positions as described without pneumothorax. Lungs are clear except for slight bibasilar atelectasis. Cardiac silhouette within normal limits. Electronically Signed   By: Lowella Grip III M.D.   On: 01/13/2016 07:28   Dg Chest Port 1 View  01/12/2016  CLINICAL DATA:  65 year old female with respiratory failure. Metastatic endometrial cancer. Shortness of breath, intubated. Initial encounter. EXAM: PORTABLE CHEST 1 VIEW COMPARISON:  CT chest abdomen and pelvis 01/11/2016 and earlier. FINDINGS: Portable AP semi upright view at 0454 hours. Endotracheal tube tip now projects in good position just below the  clavicles. Right chest porta cath remains in place. Left IJ approach dual lumen dialysis type catheter at the lower SVC level. Normal cardiac size and mediastinal contours. No pneumothorax or pulmonary edema. Confluent bilateral lower lobe consolidation is less apparent than on yesterday CT. Small right pleural effusion on that exam also is not evident today. No areas of worsening ventilation. IMPRESSION: 1. Endotracheal tube tip in good position at the level the clavicles. 2.  Otherwise, stable lines and tubes. 3. No new cardiopulmonary finding. Bilateral lower lobe consolidation compatible with pneumonia. Small right pleural effusion seen yesterday not evident radiographically. Electronically Signed   By: Genevie Ann M.D.   On: 01/12/2016 06:57   Dg Chest Port 1 View  01/11/2016  CLINICAL DATA:  Status post intubation and central line placement EXAM: PORTABLE CHEST 1 VIEW COMPARISON:  January 11, 2016 FINDINGS: Endotracheal tube is identified with distal tip 1.4 cm from carina. Retraction  by 2.5 cm is recommended. Dual lumen left jugular central venous line are identified with distal tip in the superior vena cava. Right central venous line is identified with distal tip in the superior vena cava unchanged. There is pulmonary edema. There is no pleural effusion or focal pneumonia. There is no pneumothorax. The osseous structures are stable. IMPRESSION: Endotracheal tube distal tip 1.5 cm from carina. Retraction by 2.5 cm is recommended. Left central venous line distal tips in the superior vena cava. No pneumothorax. Pulmonary edema. These results will be called to the ordering clinician or representative by the Radiologist Assistant, and communication documented in the PACS or zVision Dashboard. Electronically Signed   By: Abelardo Diesel M.D.   On: 01/11/2016 21:13   Dg Chest Portable 1 View  01/11/2016  CLINICAL DATA:  History of uterine cancer with abdominal ascites. Patient became hypertensive and diaphoretic  during blood transfusion. EXAM: PORTABLE CHEST 1 VIEW COMPARISON:  CT of the chest 01/13/2015 FINDINGS: Right internal jugular approach injectable port terminates within the expected location of superior vena cava. Cardiomediastinal silhouette is normal. Mediastinal contours appear intact. There is no evidence of pneumothorax. Lung volumes are low. There is bilateral interstitial thickening which may be seen with pulmonary vascular congestion. More confluent left lower lobe airspace consolidation cannot be excluded. Osseous structures are without acute abnormality. Soft tissues are grossly normal. IMPRESSION: Low lung volumes with bilateral interstitial thickening, likely representing pulmonary vascular congestion. More confluent left lower lobe airspace consolidation cannot be excluded. Electronically Signed   By: Fidela Salisbury M.D.   On: 01/11/2016 16:51   Dg Abd Portable 1v  01/12/2016  CLINICAL DATA:  NG tube placement. EXAM: PORTABLE ABDOMEN - 1 VIEW COMPARISON:  CT of the abdomen pelvis 01/10/2014 FINDINGS: Enteric catheter tip is seen overlying the mid upper abdomen, location is uncertain as no gastric bubble is seen, however it overlies the expected location of the proximal stomach. There is nonobstructive bowel gas pattern. Hyperdense renal shadows are seen. Bridging osteophytes of the lumbosacral spine are noted. IMPRESSION: Enteric catheter overlies expected location of the proximal stomach. Its location is uncertain as no gastric bubble is visualized. Nonobstructive bowel gas pattern. Hyperdense appearance of the renal shadows may represent retention of contrast from CT performed 15 hours ago, indicative of renal function impairment. Electronically Signed   By: Fidela Salisbury M.D.   On: 01/12/2016 11:48     Assessment/Plan   ICD-9-CM ICD-10-CM   1. HCAP (healthcare-associated pneumonia) 84 J18.9   2. Dyspnea due to #1 786.09 R06.00   3. Malignant ascites 789.51 R18.0   4.  Endometrial cancer, FIGO stage IVB (HCC) 182.0 C54.1   5. Type 2 diabetes mellitus with hypoglycemia without coma, with long-term current use of insulin (HCC) 250.80 E11.649    251.2 Z79.4    V58.67    6. Essential hypertension 401.9 I10   7. Protein-calorie malnutrition, severe ([place]) 262 E43   8. Antineoplastic chemotherapy induced anemia 285.3 D64.81    E933.1 T45.1X5A   9. Chemotherapy-induced peripheral neuropathy (HCC) 357.7 G62.0    E933.1 T45.1X5A   10. Gastroesophageal reflux disease, esophagitis presence not specified 530.81 K21.9     Consult IR stat for therapeutic paracentesis; max removal 2L  Check CBC w diff and BMP today. Send stool for c diff  CXR stat for SOB ---> mild right basilar density c/w pneumonia vs atelectasis  Repeat CXR in 2 weeks to follow infection  Start Rocephin 1 gm IM daily x 7  Avelox 400mg  po daily x 7  No floraster due to port-a-cath  Albuterol neb q6hrs prn SOB/wheezing  D/c glucophage due to frequent low BS reactions  Cont other meds as ordered  PT/OT/ST as tolerated  Consult palliative care  GOAL: short term rehab, palliative care with potential of hospice eval.  Will consider d/c home when medically appropriate. Communicated with pt and her family and nursing. Prognosis is poor  Will follow  Darcee Dekker S. Perlie Gold  [facility] [address] [place],  [address] [phone] Cell (Monday-Friday 8 AM - 5 PM) [phone] After 5 PM and follow prompts

## 2016-02-06 ENCOUNTER — Ambulatory Visit (HOSPITAL_COMMUNITY)
Admission: RE | Admit: 2016-02-06 | Discharge: 2016-02-06 | Disposition: A | Discharge status: Home / Self Care | Payer: BLUE CROSS/BLUE SHIELD | Source: Ambulatory Visit | Attending: Oncology | Admitting: Oncology

## 2016-02-06 ENCOUNTER — Other Ambulatory Visit: Payer: Self-pay | Admitting: Oncology

## 2016-02-06 DIAGNOSIS — R180 Malignant ascites: Secondary | ICD-10-CM

## 2016-02-06 DIAGNOSIS — A419 Sepsis, unspecified organism: Secondary | ICD-10-CM | POA: Diagnosis not present

## 2016-02-06 DIAGNOSIS — R531 Weakness: Secondary | ICD-10-CM | POA: Diagnosis not present

## 2016-02-06 DIAGNOSIS — R188 Other ascites: Secondary | ICD-10-CM | POA: Insufficient documentation

## 2016-02-06 NOTE — Procedures (Signed)
Ultrasound-guided therapeutic paracentesis performed yielding  2.8 liters of yellow  fluid. No immediate complications.

## 2016-02-07 ENCOUNTER — Emergency Department (HOSPITAL_COMMUNITY): Payer: BLUE CROSS/BLUE SHIELD

## 2016-02-07 ENCOUNTER — Encounter (HOSPITAL_COMMUNITY): Payer: Self-pay | Admitting: Emergency Medicine

## 2016-02-07 ENCOUNTER — Inpatient Hospital Stay (HOSPITAL_COMMUNITY)
Admission: EM | Admit: 2016-02-07 | Discharge: 2016-02-15 | DRG: 871 | Disposition: A | Discharge status: Home Health | Payer: BLUE CROSS/BLUE SHIELD | Attending: Internal Medicine | Admitting: Internal Medicine

## 2016-02-07 DIAGNOSIS — A419 Sepsis, unspecified organism: Secondary | ICD-10-CM | POA: Diagnosis present

## 2016-02-07 DIAGNOSIS — R180 Malignant ascites: Secondary | ICD-10-CM | POA: Diagnosis present

## 2016-02-07 DIAGNOSIS — K2961 Other gastritis with bleeding: Secondary | ICD-10-CM

## 2016-02-07 DIAGNOSIS — I10 Essential (primary) hypertension: Secondary | ICD-10-CM | POA: Diagnosis not present

## 2016-02-07 DIAGNOSIS — R531 Weakness: Secondary | ICD-10-CM | POA: Diagnosis present

## 2016-02-07 DIAGNOSIS — Z7984 Long term (current) use of oral hypoglycemic drugs: Secondary | ICD-10-CM

## 2016-02-07 DIAGNOSIS — C541 Malignant neoplasm of endometrium: Secondary | ICD-10-CM | POA: Diagnosis not present

## 2016-02-07 DIAGNOSIS — Z833 Family history of diabetes mellitus: Secondary | ICD-10-CM

## 2016-02-07 DIAGNOSIS — T451X5A Adverse effect of antineoplastic and immunosuppressive drugs, initial encounter: Secondary | ICD-10-CM | POA: Diagnosis present

## 2016-02-07 DIAGNOSIS — E876 Hypokalemia: Secondary | ICD-10-CM | POA: Diagnosis not present

## 2016-02-07 DIAGNOSIS — Z79891 Long term (current) use of opiate analgesic: Secondary | ICD-10-CM | POA: Diagnosis not present

## 2016-02-07 DIAGNOSIS — E119 Type 2 diabetes mellitus without complications: Secondary | ICD-10-CM | POA: Diagnosis present

## 2016-02-07 DIAGNOSIS — Z6827 Body mass index (BMI) 27.0-27.9, adult: Secondary | ICD-10-CM | POA: Diagnosis not present

## 2016-02-07 DIAGNOSIS — H409 Unspecified glaucoma: Secondary | ICD-10-CM | POA: Diagnosis present

## 2016-02-07 DIAGNOSIS — Z90710 Acquired absence of both cervix and uterus: Secondary | ICD-10-CM

## 2016-02-07 DIAGNOSIS — J90 Pleural effusion, not elsewhere classified: Secondary | ICD-10-CM | POA: Diagnosis present

## 2016-02-07 DIAGNOSIS — K922 Gastrointestinal hemorrhage, unspecified: Secondary | ICD-10-CM | POA: Diagnosis present

## 2016-02-07 DIAGNOSIS — Y95 Nosocomial condition: Secondary | ICD-10-CM | POA: Diagnosis present

## 2016-02-07 DIAGNOSIS — D6481 Anemia due to antineoplastic chemotherapy: Secondary | ICD-10-CM | POA: Diagnosis not present

## 2016-02-07 DIAGNOSIS — N179 Acute kidney failure, unspecified: Secondary | ICD-10-CM | POA: Diagnosis present

## 2016-02-07 DIAGNOSIS — G620 Drug-induced polyneuropathy: Secondary | ICD-10-CM | POA: Diagnosis present

## 2016-02-07 DIAGNOSIS — G9340 Encephalopathy, unspecified: Secondary | ICD-10-CM | POA: Diagnosis not present

## 2016-02-07 DIAGNOSIS — Z9889 Other specified postprocedural states: Secondary | ICD-10-CM

## 2016-02-07 DIAGNOSIS — Z79899 Other long term (current) drug therapy: Secondary | ICD-10-CM

## 2016-02-07 DIAGNOSIS — E46 Unspecified protein-calorie malnutrition: Secondary | ICD-10-CM

## 2016-02-07 DIAGNOSIS — E43 Unspecified severe protein-calorie malnutrition: Secondary | ICD-10-CM | POA: Diagnosis present

## 2016-02-07 DIAGNOSIS — J189 Pneumonia, unspecified organism: Secondary | ICD-10-CM | POA: Diagnosis not present

## 2016-02-07 DIAGNOSIS — W19XXXA Unspecified fall, initial encounter: Secondary | ICD-10-CM

## 2016-02-07 DIAGNOSIS — C786 Secondary malignant neoplasm of retroperitoneum and peritoneum: Secondary | ICD-10-CM | POA: Diagnosis present

## 2016-02-07 DIAGNOSIS — D649 Anemia, unspecified: Secondary | ICD-10-CM

## 2016-02-07 DIAGNOSIS — R7989 Other specified abnormal findings of blood chemistry: Secondary | ICD-10-CM | POA: Diagnosis present

## 2016-02-07 DIAGNOSIS — J9811 Atelectasis: Secondary | ICD-10-CM | POA: Diagnosis present

## 2016-02-07 LAB — HEPATIC FUNCTION PANEL
ALBUMIN: 1.4 g/dL — AB (ref 3.5–5.0)
ALT: 36 U/L (ref 14–54)
AST: 32 U/L (ref 15–41)
Alkaline Phosphatase: 117 U/L (ref 38–126)
BILIRUBIN TOTAL: 0.7 mg/dL (ref 0.3–1.2)
Bilirubin, Direct: <0.1 mg/dL — ABNORMAL LOW (ref 0.1–0.5)
TOTAL PROTEIN: 5.1 g/dL — AB (ref 6.5–8.1)

## 2016-02-07 LAB — PROTIME-INR
INR: 1.66 — AB (ref 0.00–1.49)
PROTHROMBIN TIME: 19.1 s — AB (ref 11.6–15.2)

## 2016-02-07 LAB — CBC WITH DIFFERENTIAL/PLATELET
Band Neutrophils: 5 %
Basophils Absolute: 0 10*3/uL (ref 0.0–0.1)
Basophils Relative: 0 %
Blasts: 0 %
Eosinophils Absolute: 0.5 10*3/uL (ref 0.0–0.7)
Eosinophils Relative: 2 %
HCT: 18.5 % — ABNORMAL LOW (ref 36.0–46.0)
Hemoglobin: 6 g/dL — CL (ref 12.0–15.0)
Lymphocytes Relative: 1 %
Lymphs Abs: 0.2 10*3/uL — ABNORMAL LOW (ref 0.7–4.0)
MCH: 29 pg (ref 26.0–34.0)
MCHC: 32.4 g/dL (ref 30.0–36.0)
MCV: 89.4 fL (ref 78.0–100.0)
Metamyelocytes Relative: 0 %
Monocytes Absolute: 0 10*3/uL — ABNORMAL LOW (ref 0.1–1.0)
Monocytes Relative: 0 %
Myelocytes: 3 %
Neutro Abs: 22.7 10*3/uL — ABNORMAL HIGH (ref 1.7–7.7)
Neutrophils Relative %: 89 %
Other: 0 %
Platelets: 254 10*3/uL (ref 150–400)
Promyelocytes Absolute: 0 %
RBC: 2.07 MIL/uL — ABNORMAL LOW (ref 3.87–5.11)
RDW: 16 % — ABNORMAL HIGH (ref 11.5–15.5)
WBC: 23.4 10*3/uL — ABNORMAL HIGH (ref 4.0–10.5)
nRBC: 0 /100 WBC

## 2016-02-07 LAB — URINALYSIS, ROUTINE W REFLEX MICROSCOPIC
BILIRUBIN URINE: NEGATIVE
Glucose, UA: 100 mg/dL — AB
Ketones, ur: NEGATIVE mg/dL
NITRITE: NEGATIVE
PH: 6 (ref 5.0–8.0)
Protein, ur: 30 mg/dL — AB
SPECIFIC GRAVITY, URINE: 1.013 (ref 1.005–1.030)

## 2016-02-07 LAB — BASIC METABOLIC PANEL
Anion gap: 11 (ref 5–15)
BUN: 28 mg/dL — ABNORMAL HIGH (ref 6–20)
CO2: 22 mmol/L (ref 22–32)
Calcium: 7.5 mg/dL — ABNORMAL LOW (ref 8.9–10.3)
Chloride: 109 mmol/L (ref 101–111)
Creatinine, Ser: 1.96 mg/dL — ABNORMAL HIGH (ref 0.44–1.00)
GFR calc Af Amer: 30 mL/min — ABNORMAL LOW (ref >60)
GFR calc non Af Amer: 26 mL/min — ABNORMAL LOW (ref >60)
Glucose, Bld: 270 mg/dL — ABNORMAL HIGH (ref 65–99)
Potassium: 4.2 mmol/L (ref 3.5–5.1)
Sodium: 142 mmol/L (ref 135–145)

## 2016-02-07 LAB — URINE MICROSCOPIC-ADD ON
Bacteria, UA: NONE SEEN
Squamous Epithelial / LPF: NONE SEEN

## 2016-02-07 LAB — GLUCOSE, CAPILLARY: Glucose-Capillary: 150 mg/dL — ABNORMAL HIGH (ref 65–99)

## 2016-02-07 LAB — MRSA PCR SCREENING: MRSA by PCR: NEGATIVE

## 2016-02-07 LAB — POC OCCULT BLOOD, ED: FECAL OCCULT BLD: NEGATIVE

## 2016-02-07 LAB — LACTIC ACID, PLASMA: Lactic Acid, Venous: 2.5 mmol/L (ref 0.5–2.0)

## 2016-02-07 LAB — I-STAT CG4 LACTIC ACID, ED: Lactic Acid, Venous: 2.48 mmol/L (ref 0.5–2.0)

## 2016-02-07 LAB — PREPARE RBC (CROSSMATCH)

## 2016-02-07 LAB — MAGNESIUM: MAGNESIUM: 1.2 mg/dL — AB (ref 1.7–2.4)

## 2016-02-07 MED ORDER — DEXTROSE 5 % IV SOLN: 1.0000 g | Freq: Three times a day (TID) | INTRAVENOUS | Status: DC

## 2016-02-07 MED ORDER — TIMOLOL MALEATE 0.5 % OP SOLN
1.0000 [drp] | Freq: Two times a day (BID) | OPHTHALMIC | Status: DC
  Administered 2016-02-07 – 2016-02-15 (×16): 1 [drp] via OPHTHALMIC
  Filled 2016-02-07: qty 5

## 2016-02-07 MED ORDER — INSULIN ASPART 100 UNIT/ML ~~LOC~~ SOLN
0.0000 [IU] | Freq: Three times a day (TID) | SUBCUTANEOUS | Status: DC
  Administered 2016-02-08: 2 [IU] via SUBCUTANEOUS
  Administered 2016-02-08: 1 [IU] via SUBCUTANEOUS
  Administered 2016-02-09 – 2016-02-10 (×4): 2 [IU] via SUBCUTANEOUS
  Administered 2016-02-10: 3 [IU] via SUBCUTANEOUS
  Administered 2016-02-11 (×2): 2 [IU] via SUBCUTANEOUS
  Administered 2016-02-11: 3 [IU] via SUBCUTANEOUS
  Administered 2016-02-12: 1 [IU] via SUBCUTANEOUS
  Administered 2016-02-12: 3 [IU] via SUBCUTANEOUS
  Administered 2016-02-12: 2 [IU] via SUBCUTANEOUS
  Administered 2016-02-13: 5 [IU] via SUBCUTANEOUS
  Administered 2016-02-13: 1 [IU] via SUBCUTANEOUS
  Administered 2016-02-13: 2 [IU] via SUBCUTANEOUS
  Administered 2016-02-14 (×2): 3 [IU] via SUBCUTANEOUS
  Administered 2016-02-14 – 2016-02-15 (×3): 1 [IU] via SUBCUTANEOUS

## 2016-02-07 MED ORDER — ONDANSETRON HCL 4 MG PO TABS: 4.0000 mg | ORAL_TABLET | Freq: Four times a day (QID) | ORAL | Status: DC | PRN

## 2016-02-07 MED ORDER — SODIUM CHLORIDE 0.9 % IV SOLN: 10.0000 mL/h | Freq: Once | INTRAVENOUS | Status: DC

## 2016-02-07 MED ORDER — NYSTATIN 100000 UNIT/ML MT SUSP
5.0000 mL | Freq: Four times a day (QID) | OROMUCOSAL | Status: DC
  Administered 2016-02-07 – 2016-02-15 (×30): 500000 [IU] via ORAL
  Filled 2016-02-07 (×30): qty 5

## 2016-02-07 MED ORDER — MIDODRINE HCL 5 MG PO TABS
10.0000 mg | ORAL_TABLET | Freq: Three times a day (TID) | ORAL | Status: DC
  Administered 2016-02-08 – 2016-02-11 (×10): 10 mg via ORAL
  Filled 2016-02-07 (×13): qty 2

## 2016-02-07 MED ORDER — ENOXAPARIN SODIUM 30 MG/0.3ML ~~LOC~~ SOLN
30.0000 mg | SUBCUTANEOUS | Status: DC
  Administered 2016-02-07 – 2016-02-12 (×6): 30 mg via SUBCUTANEOUS
  Filled 2016-02-07 (×6): qty 0.3

## 2016-02-07 MED ORDER — PIPERACILLIN-TAZOBACTAM 3.375 G IVPB 30 MIN
3.3750 g | Freq: Once | INTRAVENOUS | Status: AC
Start: 2016-02-07 — End: 2016-02-07
  Administered 2016-02-07: 3.375 g via INTRAVENOUS
  Filled 2016-02-07: qty 50

## 2016-02-07 MED ORDER — DEXTROSE 5 % IV SOLN
1.0000 g | INTRAVENOUS | Status: AC
  Administered 2016-02-07 – 2016-02-14 (×8): 1 g via INTRAVENOUS
  Filled 2016-02-07 (×8): qty 1

## 2016-02-07 MED ORDER — RA NUTRITIONAL SUPPLEMENT PO LIQD: Freq: Two times a day (BID) | ORAL | Status: DC

## 2016-02-07 MED ORDER — LORAZEPAM 0.5 MG PO TABS: 0.5000 mg | ORAL_TABLET | Freq: Four times a day (QID) | ORAL | Status: DC | PRN

## 2016-02-07 MED ORDER — PRAVASTATIN SODIUM 20 MG PO TABS
20.0000 mg | ORAL_TABLET | Freq: Every day | ORAL | Status: DC
  Administered 2016-02-08 – 2016-02-15 (×8): 20 mg via ORAL
  Filled 2016-02-07 (×8): qty 1

## 2016-02-07 MED ORDER — VANCOMYCIN HCL IN DEXTROSE 750-5 MG/150ML-% IV SOLN
750.0000 mg | INTRAVENOUS | Status: AC
  Administered 2016-02-08 – 2016-02-14 (×7): 750 mg via INTRAVENOUS
  Filled 2016-02-07 (×7): qty 150

## 2016-02-07 MED ORDER — LATANOPROST 0.005 % OP SOLN
1.0000 [drp] | Freq: Every day | OPHTHALMIC | Status: DC
  Administered 2016-02-07 – 2016-02-14 (×8): 1 [drp] via OPHTHALMIC
  Filled 2016-02-07: qty 2.5

## 2016-02-07 MED ORDER — SODIUM CHLORIDE 0.9% FLUSH
10.0000 mL | INTRAVENOUS | Status: DC | PRN
  Administered 2016-02-12 – 2016-02-15 (×4): 10 mL
  Filled 2016-02-07 (×4): qty 40

## 2016-02-07 MED ORDER — ACETAMINOPHEN 325 MG PO TABS: 650.0000 mg | ORAL_TABLET | Freq: Four times a day (QID) | ORAL | Status: DC | PRN

## 2016-02-07 MED ORDER — SODIUM CHLORIDE 0.9% FLUSH
3.0000 mL | Freq: Two times a day (BID) | INTRAVENOUS | Status: DC
Start: 2016-02-07 — End: 2016-02-15
  Administered 2016-02-08 – 2016-02-14 (×14): 3 mL via INTRAVENOUS

## 2016-02-07 MED ORDER — GLIPIZIDE ER 5 MG PO TB24: 5.0000 mg | ORAL_TABLET | Freq: Three times a day (TID) | ORAL | Status: DC

## 2016-02-07 MED ORDER — FERROUS FUMARATE 324 (106 FE) MG PO TABS
1.0000 | ORAL_TABLET | Freq: Two times a day (BID) | ORAL | Status: DC
  Administered 2016-02-07 – 2016-02-15 (×16): 106 mg via ORAL
  Filled 2016-02-07 (×22): qty 1

## 2016-02-07 MED ORDER — PIPERACILLIN-TAZOBACTAM 3.375 G IVPB
3.3750 g | Freq: Three times a day (TID) | INTRAVENOUS | Status: DC
  Filled 2016-02-07: qty 50

## 2016-02-07 MED ORDER — VANCOMYCIN HCL IN DEXTROSE 1-5 GM/200ML-% IV SOLN
1000.0000 mg | Freq: Once | INTRAVENOUS | Status: AC
  Administered 2016-02-07: 1000 mg via INTRAVENOUS
  Filled 2016-02-07: qty 200

## 2016-02-07 MED ORDER — SODIUM CHLORIDE 0.9 % IV BOLUS (SEPSIS)
1000.0000 mL | Freq: Once | INTRAVENOUS | Status: AC
  Administered 2016-02-07: 1000 mL via INTRAVENOUS

## 2016-02-07 MED ORDER — ONDANSETRON HCL 4 MG/2ML IJ SOLN: 4.0000 mg | Freq: Four times a day (QID) | INTRAMUSCULAR | Status: DC | PRN

## 2016-02-07 MED ORDER — ALBUTEROL SULFATE (2.5 MG/3ML) 0.083% IN NEBU
2.5000 mg | INHALATION_SOLUTION | Freq: Four times a day (QID) | RESPIRATORY_TRACT | Status: DC | PRN
  Administered 2016-02-09 – 2016-02-11 (×2): 2.5 mg via RESPIRATORY_TRACT
  Filled 2016-02-07 (×2): qty 3

## 2016-02-07 MED ORDER — SODIUM CHLORIDE 0.9 % IV BOLUS (SEPSIS)
500.0000 mL | Freq: Once | INTRAVENOUS | Status: AC
Start: 2016-02-08 — End: 2016-02-08
  Administered 2016-02-08: 500 mL via INTRAVENOUS

## 2016-02-07 MED ORDER — OXYCODONE HCL 5 MG PO TABS
5.0000 mg | ORAL_TABLET | ORAL | Status: DC | PRN
  Administered 2016-02-08 – 2016-02-14 (×2): 5 mg via ORAL
  Filled 2016-02-07 (×2): qty 1

## 2016-02-07 MED ORDER — ONDANSETRON HCL 4 MG PO TABS: 4.0000 mg | ORAL_TABLET | Freq: Three times a day (TID) | ORAL | Status: DC | PRN

## 2016-02-07 NOTE — ED Notes (Signed)
Patient transported to X-ray 

## 2016-02-07 NOTE — ED Notes (Addendum)
Pt from [facility]. Has hx of uterine CA. Pt currently had PNA and is being treated for. Pt had labs drawn yesterday at facility, hgb came back low 6.3. Pt is bedridden, denies any pain.A&O x 4.

## 2016-02-07 NOTE — H&P (Addendum)
[facility] History and Physical  Brittany Schwartz [id number] DOB: 1951/05/26 DOA: 02/07/2016  Referring physician: ED physician, Kathrynn Humble  PCP: [name], MD   Chief Complaint: blood counts low   HPI:  Pt is 65 yo female with grade 3 serous endometrial ca, adjuvant chemo completed 06/16/15, now with rapid recurrent disease since mid January with large volume malignant ascites, bulky involvement pelvis and abdominal caking. She is followed by Dr. Marko Plume with oncology for ongoing chemo, last dose given 01/10/16. Recently admitted in February with shortness of breath, was intubated (extubated 01/14/2016) and required CVVHD, completed 10 days of vanc and zosyn, was also on stress dose steroids until 01/24/2016, was sent from the rehab with anemia and elevated WBC. Pt reports noticing weakness but denies any specific symptoms such as chest pain or shortness of breath, no fevers, chills, no specific abd or urinary concerns.   In ED, pt noted to be hemodynamically stable, VSS, blood work notable for WBC 23 K, Hg 6, Cr 1.96, TRH asked to admit for further evaluation. Off note, pt just had paracentesis done 02/06/2016 and had 2.8 L fluid removed but the fluid was not sent for analysis.   Assessment and plan:  Principal Problem:   Sepsis due to Bilateral pneumonia - sepsis and pneumonia order set in place - unable to entirely exclude GI pathology - we have also requested GI stool panel and C. DIff given recent broad spectrum abx use - pt however denies any specific abd concerns and has not had diarrhea today so not sure if this could be etiology but will certainly keep on it and if any diarrhea, will need to rule out C. Diff  - agree with ABX chosen by ED doctor  - follow up on lactic acid and procalcitonin  - narrow ABX as quickly as possible   Active Problems:   Anemia associated with chemotherapy / GI bleed - transfuse two unit of PRBC - FOBT negative  - CBC in AM    AKI (acute  kidney injury) ([place]) - based on record review Cr is trending down 3.8--->  2.82 --> 1.96 on this admission     Endometrial cancer ([place]) - notify Dr. Marko Plume of pt's admission     Malignant ascites - s/p paracentesis 3/7, 2.8 L fluid removed - not sent for analysis so not clear if SBP    Essential hypertension - reasonably stable on admission     Chemotherapy-induced peripheral neuropathy ([place])    Non-insulin dependent type 2 diabetes mellitus ([place]) - continue home medical regimen  - also place on SSI    Protein-calorie malnutrition, severe ([place]) - Significant hypoalbuminemia with low protein stores noted.   Lovenox SQ for DVT prophylaxis   Radiological Exams on Admission: Dg Chest 2 View 02/07/2016 Bibasilar opacities concerning for pneumonia. Small effusions. No real change since prior study.   US Paracentesis 02/06/2016  ISuccessful ultrasound-guided therapeutic paracentesis yielding 2.8 liters of peritoneal fluid.   Code Status: Full Family Communication: Pt and family at bedside Disposition Plan: Admit for further evaluation    Mart Piggs [facility] [id number]   Review of Systems:  Constitutional: Negative for diaphoresis.  HENT: Negative for hearing loss, ear pain, nosebleeds, congestion, sore throat, neck pain, tinnitus and ear discharge.   Eyes: Negative for blurred vision, double vision, photophobia, pain, discharge and redness.  Respiratory: Negative for cough, wheezing and stridor.   Cardiovascular: Negative for chest pain, palpitations, orthopnea, claudication and leg swelling.  Gastrointestinal: Per HPI  Genitourinary: Negative for  dysuria, urgency, frequency, hematuria and flank pain.  Musculoskeletal: Negative for myalgias, back pain, joint pain and falls.  Skin: Negative for itching and rash.  Neurological: Negative for dizziness and weakness. Endo/Heme/Allergies: Negative for environmental allergies and polydipsia. Does not bruise/bleed easily.   Psychiatric/Behavioral: Negative for suicidal ideas. The patient is not nervous/anxious.      Past Medical History  Diagnosis Date  . Hypertension   . Glaucoma   . Diabetes mellitus without complication ([place])   . Palpitations   . Muscle cramps   . Postmenopausal vaginal bleeding   . Uterine cancer [facility])     Past Surgical History  Procedure Laterality Date  . Robotic assisted total hysterectomy with bilateral salpingo oopherectomy Bilateral 01/19/2015    Procedure: XI ROBOTIC ASSISTED TOTAL HYSTERECTOMY WITH BILATERAL SALPINGO OOPHORECTOMY;  Surgeon: [name], MD;  Location: [facility];  Service: Gynecology;  Laterality: Bilateral;  . Cystoscopy w/ ureteral stent placement Bilateral 01/13/2016    Procedure: CYSTOSCOPY WITH RETROGRADE PYELOGRAM/BILATERAL URETERAL STENT PLACEMENT;  Surgeon: [name], MD;  Location: [facility];  Service: Urology;  Laterality: Bilateral;    Social History:  reports that she has never smoked. She has never used smokeless tobacco. She reports that she does not drink alcohol or use illicit drugs.  No Known Allergies  Family History  Problem Relation Age of Onset  . Diabetes Father   . Arrhythmia Mother     Prior to Admission medications   Medication Sig Start Date End Date Taking? Authorizing Provider  acetaminophen (TYLENOL) 325 MG tablet Take 650 mg by mouth every 6 (six) hours as needed for mild pain, moderate pain or headache. Reported on 01/08/2016   Yes Historical Provider, MD  albuterol (PROVENTIL) (2.5 MG/3ML) 0.083% nebulizer solution Take 2.5 mg by nebulization every 6 (six) hours as needed for wheezing or shortness of breath.   Yes Historical Provider, MD  cefTRIAXone (ROCEPHIN) 1 g injection Inject 1 g into the muscle daily.   Yes Historical Provider, MD  feeding supplement, GLUCERNA SHAKE, (GLUCERNA SHAKE) LIQD Take 237 mLs by mouth daily. Patient taking differently: Take 120 mLs by mouth 2 (two) times daily.  01/04/16  Yes Albertine Patricia, MD  ferrous fumarate (HEMOCYTE - 106 MG FE) 325 (106 FE) MG TABS tablet Take 1 tablet daily  on an empty stomach with OJ or vitamin C tablet 09/11/15  Yes [name], MD  latanoprost (XALATAN) 0.005 % ophthalmic solution Place 1 drop into both eyes at bedtime.   Yes Historical Provider, MD  lidocaine-prilocaine (EMLA) cream Apply to Porta-Cath 1-2 hrs prior to access as directed. 01/02/16  Yes [name], MD  LORazepam (ATIVAN) 0.5 MG tablet Place 1 tablet under tongue or swallow every 8 hours as needed for nausea. Will make drowsy. 01/30/16  Yes [name], MD  midodrine (PROAMATINE) 10 MG tablet Take 1 tablet (10 mg total) by mouth 3 (three) times daily with meals. 01/30/16  Yes Christina P Rama, MD  moxifloxacin (AVELOX) 400 MG tablet Take 400 mg by mouth daily at 8 pm.   Yes Historical Provider, MD  Nutritional Supplements (RA NUTRITIONAL SUPPLEMENT PO) Take 1 Units by mouth 2 (two) times daily.   Yes Historical Provider, MD  nystatin (MYCOSTATIN) 100000 UNIT/ML suspension Take 5 mLs (500,000 Units total) by mouth 4 (four) times daily. 01/30/16  Yes Christina P Rama, MD  ondansetron (ZOFRAN) 8 MG tablet Take 1 tablet by mouth every 8 hours as needed for nausea. Will not  make drowsy. 01/02/16  Yes [name], MD  oxyCODONE (ROXICODONE) 5 MG immediate release tablet Take 1 tablet every 4-6 hours as needed for pain. 01/30/16  Yes Christina P Rama, MD  pantoprazole (PROTONIX) 40 MG tablet Take 1 tablet (40 mg total) by mouth daily. 11/20/15  Yes Everitt Amber, MD  pravastatin (PRAVACHOL) 20 MG tablet Take 20 mg by mouth daily.   Yes Historical Provider, MD  timolol (BETIMOL) 0.5 % ophthalmic solution Place 1 drop into both eyes 2 (two) times daily.   Yes Historical Provider, MD  glipiZIDE (GLUCOTROL XL) 5 MG 24 hr tablet Take 5 mg by mouth 3 (three) times daily. 12/12/15   Historical Provider, MD  insulin lispro (HUMALOG KWIKPEN) 100 UNIT/ML KiwkPen Inject into the skin.  Inject per sliding scale: if 0-140 =0; 150-600=3, subcutaneously before meals    Historical Provider, MD  polyethylene glycol (MIRALAX / GLYCOLAX) packet Take 17 g by mouth daily as needed for mild constipation. Patient not taking: Reported on 02/07/2016 01/30/16   [name], MD    Physical Exam: Filed Vitals:   02/07/16 1152 02/07/16 1403  BP: 129/75   Pulse: 99   Temp: 98.4 F (36.9 C)   TempSrc: Oral   Resp: 16   Height:  5\' 1"  (1.549 m)  Weight:  66.679 kg (147 lb)  SpO2: 98%     Physical Exam  Constitutional: Appears well-developed and well-nourished. No distress.  HENT: Normocephalic. External right and left ear normal. Dry MM Eyes: Conjunctivae and EOM are normal. PERRLA, no scleral icterus.  Neck: Normal ROM. Neck supple. No JVD. No tracheal deviation. No thyromegaly.  CVS: RRR, no gallops, no carotid bruit.  Pulmonary: Effort and breath sounds normal, no stridor, rhonchi, wheezes, rales.  Abdominal: Soft. BS +, mild distension, tenderness, rebound or guarding.  Musculoskeletal: Normal range of motion.   Lymphadenopathy: No lymphadenopathy noted, cervical, inguinal. Neuro: Alert. Normal reflexes, muscle tone coordination. No cranial nerve deficit. Skin: Skin is warm and dry. No rash noted. Not diaphoretic. No erythema. No pallor.  Psychiatric: Normal mood and affect. Behavior, judgment, thought content normal.   Labs on Admission:  Basic Metabolic Panel:  Recent Labs Lab 02/04/16 02/07/16 1224  NA 145 142  K 5.0 4.2  CL  --  109  CO2  --  22  GLUCOSE  --  270*  BUN 32* 28*  CREATININE 2.3* 1.96*  CALCIUM  --  7.5*   CBC:  Recent Labs Lab 02/07/16 1224  WBC 23.4*  NEUTROABS 22.7*  HGB 6.0*  HCT 18.5*  MCV 89.4  PLT 254   EKG: pending   If 7PM-7AM, please contact night-coverage [email or web address] Password [email or web address] 02/07/2016, 2:35 PM

## 2016-02-07 NOTE — ED Notes (Signed)
NURSE IS GOING IN  NAD COLLECT LABS

## 2016-02-07 NOTE — Progress Notes (Signed)
Patient noted to have had been admitted and discharged from the hospital from 02/09 to 02/28 with diagnosis of acute respiratory failure and septic shock.  Patient with endometrial carcinoma.  EDCM spoke to patient and her husband at bedside.  Patient was discharged from the hospital and transferred to [facility]. Patient's husband reports the patient has only been there for a week.  Patient's husband reports "if she gets better and she can walk then we would like her to go home" but is also aware that patient may need to return to facility for additional rehab.  Patient reports she would like to go home but is also aware that she may need additional rehab.  No further EDCM needs at this time.

## 2016-02-07 NOTE — Progress Notes (Signed)
Pharmacy Antibiotic Note  Brittany Schwartz is a 65 y.o. female admitted on 02/07/2016 with pneumonia.  Pharmacy has been consulted for Zosyn dosing. Pt has history of uterine cancer and has received chemo recently.   Plan: Zosyn 3.375g IV q8h (4 hour infusion).  Height: 5\' 1"  (154.9 cm) Weight: 147 lb (66.679 kg) IBW/kg (Calculated) : 47.8  Temp (24hrs), Avg:98.4 F (36.9 C), Min:98.4 F (36.9 C), Max:98.4 F (36.9 C)   Recent Labs Lab 02/04/16 02/07/16 1224 02/07/16 1417  WBC  --  23.4*  --   CREATININE 2.3* 1.96*  --   LATICACIDVEN  --   --  2.48*    Estimated Creatinine Clearance: 25 mL/min (by C-G formula based on Cr of 1.96).    No Known Allergies  Antimicrobials this admission: Vancomycin 3/8 >>  Zosyn 3/8 >>   Dose adjustments this admission: ----  Microbiology results: 3/8 BCx:   UCx:    Sputum:    MRSA PCR:   Thank you for allowing pharmacy to be a part of this patient's care.  Royetta Asal, PharmD, BCPS Pager [phone] 02/07/2016 3:12 PM

## 2016-02-07 NOTE — ED Notes (Signed)
REPORT WILL BE GIVEN AT THE BEDSIDE. AAOX3. PT IN NO APPARENT DISTRESS OR PAIN. THE OPPORTUNITY TO ASK QUESTIONS WAS PROVIDED.

## 2016-02-07 NOTE — ED Notes (Signed)
Admitting RN at bedside.  

## 2016-02-07 NOTE — Progress Notes (Signed)
Pharmacy Antibiotic Note  Brittany Schwartz is a 65 y.o. female admitted on 02/07/2016 with pneumonia.  Pharmacy has been consulted for Vancomycin dosing as well as antibiotic renal dose adjustments.  Pt has a history of uterine cancer s/p resection, currently receiving chemo.  Cycle 1 completed 2/8.  Admitted from SNF for low Hgb <7.  Was started on Ceftriaxone 3/7 at facility for PNA.  Vanc 1g and Zosyn 3.375g first doses given in ED.  Plan: Vancomycin 750 mg IV q24h. Adjust Cefepime dose to 1g IV q24h for CrCl~25 ml/min. F/u SCr, culture results.  Height: 5\' 1"  (154.9 cm) Weight: 147 lb (66.679 kg) IBW/kg (Calculated) : 47.8  Temp (24hrs), Avg:98.8 F (37.1 C), Min:98.4 F (36.9 C), Max:99.1 F (37.3 C)   Recent Labs Lab 02/04/16 02/07/16 1224 02/07/16 1417  WBC  --  23.4*  --   CREATININE 2.3* 1.96*  --   LATICACIDVEN  --   --  2.48*    Estimated Creatinine Clearance: 25 mL/min (by C-G formula based on Cr of 1.96).    No Known Allergies  Antimicrobials this admission: 3/8 Zosyn x 1 3/8 Vancomycin >>  3/8 Cefepime >>  Dose adjustments this admission: -  Microbiology results: 3/8 BCx: sent 3/8 UCx: sent 3/8 GI panel by PCR: 3/8 sputum:  3/8 strep pneumo ur ag:   Thank you for allowing pharmacy to be a part of this patient's care.  Hershal Coria 02/07/2016 8:34 PM

## 2016-02-07 NOTE — ED Notes (Signed)
Pt to xray

## 2016-02-07 NOTE — ED Provider Notes (Signed)
CSN: [id number]     Arrival date & time 02/07/16  1145 History   First MD Initiated Contact with Patient 02/07/16 1241     Chief Complaint  Patient presents with  . Abnormal Lab    hgb     (Consider location/radiation/quality/duration/timing/severity/associated sxs/prior Treatment) HPI Comments: Pt comes in with cc of abnormal labs. Pt has hx of uterine ca. S/p resection but relapsed and her medical comorbidities. Pt is getting chemo. Pt sent from the rehab with anemia and elevated WC. Pt has generalized weakness today, but denies any new cough, new chest pain, dib, headaches, neck pain/stiffness. She had paracenthesis done yday. She has mild abd pain, generalized, but she had the same pain after last paracenthesis. She has no uti like symptoms. Pt also denies any bloody stools - does reports that she has darker stools, but they started after she was given calcium and iron. She does endorse loose BM on Sun and Mon - 5+ episodes, watery. + recent admission to icu with septic shock.   ROS 10 Systems reviewed and are negative for acute change except as noted in the HPI.     The history is provided by the patient.    Past Medical History  Diagnosis Date  . Hypertension   . Glaucoma   . Diabetes mellitus without complication ([place])   . Palpitations   . Muscle cramps   . Postmenopausal vaginal bleeding   . Uterine cancer [facility])    Past Surgical History  Procedure Laterality Date  . Robotic assisted total hysterectomy with bilateral salpingo oopherectomy Bilateral 01/19/2015    Procedure: XI ROBOTIC ASSISTED TOTAL HYSTERECTOMY WITH BILATERAL SALPINGO OOPHORECTOMY;  Surgeon: [name], MD;  Location: [facility];  Service: Gynecology;  Laterality: Bilateral;  . Cystoscopy w/ ureteral stent placement Bilateral 01/13/2016    Procedure: CYSTOSCOPY WITH RETROGRADE PYELOGRAM/BILATERAL URETERAL STENT PLACEMENT;  Surgeon: [name], MD;  Location: [facility];  Service: Urology;  Laterality:  Bilateral;   Family History  Problem Relation Age of Onset  . Diabetes Father   . Arrhythmia Mother    Social History  Substance Use Topics  . Smoking status: Never Smoker   . Smokeless tobacco: Never Used  . Alcohol Use: No   OB History    No data available     Review of Systems    Allergies  Review of patient's allergies indicates no known allergies.  Home Medications   Prior to Admission medications   Medication Sig Start Date End Date Taking? Authorizing Provider  acetaminophen (TYLENOL) 325 MG tablet Take 650 mg by mouth every 6 (six) hours as needed for mild pain, moderate pain or headache. Reported on 01/08/2016   Yes Historical Provider, MD  albuterol (PROVENTIL) (2.5 MG/3ML) 0.083% nebulizer solution Take 2.5 mg by nebulization every 6 (six) hours as needed for wheezing or shortness of breath.   Yes Historical Provider, MD  cefTRIAXone (ROCEPHIN) 1 g injection Inject 1 g into the muscle daily.   Yes Historical Provider, MD  feeding supplement, GLUCERNA SHAKE, (GLUCERNA SHAKE) LIQD Take 237 mLs by mouth daily. Patient taking differently: Take 120 mLs by mouth 2 (two) times daily.  01/04/16  Yes Albertine Patricia, MD  ferrous fumarate (HEMOCYTE - 106 MG FE) 325 (106 FE) MG TABS tablet Take 1 tablet daily  on an empty stomach with OJ or vitamin C tablet 09/11/15  Yes [name], MD  latanoprost (XALATAN) 0.005 % ophthalmic solution Place 1 drop into both eyes at bedtime.  Yes Historical Provider, MD  lidocaine-prilocaine (EMLA) cream Apply to Porta-Cath 1-2 hrs prior to access as directed. 01/02/16  Yes [name], MD  LORazepam (ATIVAN) 0.5 MG tablet Place 1 tablet under tongue or swallow every 8 hours as needed for nausea. Will make drowsy. 01/30/16  Yes [name], MD  midodrine (PROAMATINE) 10 MG tablet Take 1 tablet (10 mg total) by mouth 3 (three) times daily with meals. 01/30/16  Yes Christina P Rama, MD  moxifloxacin (AVELOX) 400 MG tablet Take 400 mg  by mouth daily at 8 pm.   Yes Historical Provider, MD  Nutritional Supplements (RA NUTRITIONAL SUPPLEMENT PO) Take 1 Units by mouth 2 (two) times daily.   Yes Historical Provider, MD  nystatin (MYCOSTATIN) 100000 UNIT/ML suspension Take 5 mLs (500,000 Units total) by mouth 4 (four) times daily. 01/30/16  Yes Christina P Rama, MD  ondansetron (ZOFRAN) 8 MG tablet Take 1 tablet by mouth every 8 hours as needed for nausea. Will not make drowsy. 01/02/16  Yes [name], MD  oxyCODONE (ROXICODONE) 5 MG immediate release tablet Take 1 tablet every 4-6 hours as needed for pain. 01/30/16  Yes Christina P Rama, MD  pantoprazole (PROTONIX) 40 MG tablet Take 1 tablet (40 mg total) by mouth daily. 11/20/15  Yes Everitt Amber, MD  pravastatin (PRAVACHOL) 20 MG tablet Take 20 mg by mouth daily.   Yes Historical Provider, MD  timolol (BETIMOL) 0.5 % ophthalmic solution Place 1 drop into both eyes 2 (two) times daily.   Yes Historical Provider, MD  glipiZIDE (GLUCOTROL XL) 5 MG 24 hr tablet Take 5 mg by mouth 3 (three) times daily. 12/12/15   Historical Provider, MD  insulin lispro (HUMALOG KWIKPEN) 100 UNIT/ML KiwkPen Inject into the skin. Inject per sliding scale: if 0-140 =0; 150-600=3, subcutaneously before meals    Historical Provider, MD  polyethylene glycol (MIRALAX / GLYCOLAX) packet Take 17 g by mouth daily as needed for mild constipation. Patient not taking: Reported on 02/07/2016 01/30/16   [name], MD   BP 129/75 mmHg  Pulse 99  Temp(Src) 98.4 F (36.9 C) (Oral)  Resp 16  Ht 5\' 1"  (1.549 m)  Wt 147 lb (66.679 kg)  BMI 27.79 kg/m2  SpO2 98% Physical Exam  Constitutional: She is oriented to person, place, and time. She appears well-developed.  HENT:  Head: Normocephalic and atraumatic.  Eyes: Conjunctivae and EOM are normal. Pupils are equal, round, and reactive to light.  Neck: Normal range of motion. Neck supple.  Cardiovascular: Normal rate, regular rhythm and normal heart sounds.    Pulmonary/Chest: Effort normal and breath sounds normal. No respiratory distress.  Abdominal: Soft. Bowel sounds are normal. She exhibits distension. There is tenderness. There is no rebound and no guarding.  Generalized tenderness to palpation  Neurological: She is alert and oriented to person, place, and time.  Skin: Skin is warm and dry.  Nursing note and vitals reviewed.   ED Course  Procedures (including critical care time) Labs Review Labs Reviewed  CBC WITH DIFFERENTIAL/PLATELET - Abnormal; Notable for the following:    WBC 23.4 (*)    RBC 2.07 (*)    Hemoglobin 6.0 (*)    HCT 18.5 (*)    RDW 16.0 (*)    Neutro Abs 22.7 (*)    Lymphs Abs 0.2 (*)    Monocytes Absolute 0.0 (*)    All other components within normal limits  BASIC METABOLIC PANEL - Abnormal; Notable for the following:  Glucose, Bld 270 (*)    BUN 28 (*)    Creatinine, Ser 1.96 (*)    Calcium 7.5 (*)    GFR calc non Af Amer 26 (*)    GFR calc Af Amer 30 (*)    All other components within normal limits  HEPATIC FUNCTION PANEL - Abnormal; Notable for the following:    Total Protein 5.1 (*)    Albumin 1.4 (*)    Bilirubin, Direct <0.1 (*)    All other components within normal limits  URINALYSIS, ROUTINE W REFLEX MICROSCOPIC (NOT AT [facility]) - Abnormal; Notable for the following:    APPearance CLOUDY (*)    Glucose, UA 100 (*)    Hgb urine dipstick LARGE (*)    Protein, ur 30 (*)    Leukocytes, UA SMALL (*)    All other components within normal limits  I-STAT CG4 LACTIC ACID, ED - Abnormal; Notable for the following:    Lactic Acid, Venous 2.48 (*)    All other components within normal limits  CULTURE, BLOOD (ROUTINE X 2)  CULTURE, BLOOD (ROUTINE X 2)  URINE CULTURE  GASTROINTESTINAL PANEL BY PCR, STOOL (REPLACES STOOL CULTURE)  URINE MICROSCOPIC-ADD ON  PROTIME-INR  POC OCCULT BLOOD, ED  TYPE AND SCREEN  PREPARE RBC (CROSSMATCH)    Imaging Review Dg Chest 2 View  02/07/2016  CLINICAL DATA:   Pneumonia, undergoing current treatment. EXAM: CHEST  2 VIEW COMPARISON:  01/26/2016 FINDINGS: Bibasilar airspace opacities are similar to prior study may reflect pneumonia. Small bilateral effusions. Right Port-A-Cath is in place with the tip in the SVC. Interval removal of left internal jugular central line. Heart is normal size. IMPRESSION: Bibasilar opacities concerning for pneumonia. Small effusions. No real change since prior study. Electronically Signed   By: Rolm Baptise M.D.   On: 02/07/2016 14:24   US Paracentesis  02/06/2016  INDICATION: Patient with history of high-grade serous endometrial carcinoma, recurrent ascites. Request made for therapeutic paracentesis. EXAM: ULTRASOUND GUIDED THERAPEUTIC PARACENTESIS MEDICATIONS: None. COMPLICATIONS: None immediate. PROCEDURE: Informed written consent was obtained from the patient after a discussion of the risks, benefits and alternatives to treatment. A timeout was performed prior to the initiation of the procedure. Initial ultrasound scanning demonstrates a small to moderate amount of ascites within the left lower abdominal quadrant. The left lower abdomen was prepped and draped in the usual sterile fashion. 1% lidocaine was used for local anesthesia. Following this, a Yueh catheter was introduced. An ultrasound image was saved for documentation purposes. The paracentesis was performed. The catheter was removed and a dressing was applied. The patient tolerated the procedure well without immediate post procedural complication. FINDINGS: A total of approximately 2.8 liters of yellow fluid was removed. IMPRESSION: Successful ultrasound-guided therapeutic paracentesis yielding 2.8 liters of peritoneal fluid. Read by: Rowe Robert, PA-C Electronically Signed   By: Sandi Mariscal M.D.   On: 02/06/2016 12:21   I have personally reviewed and evaluated these images and lab results as part of my medical decision-making.   EKG Interpretation None      MDM    Final diagnoses:  Anemia, unspecified anemia type  HCAP (healthcare-associated pneumonia)    Pt comes in with cc of low Hb. She is noted to have elevated WC. Pt is s/p paracehtesis yday, recent admission for HCAP and septic shock.  - Hb: Hemoccult neg. Likely Anemia of chronic disease. Will transfuse to goal of 8.  - elevated WC - could be paraneoplastic vs. Infection. She has elevated  HR - which is likely from anemia - but we will be aggressive and start sepsis protocol. Abd exam reveals no peritoneal signs. She has no emesis, nausea - we dont think SBP is likely. Paracenthesis was yday, unlikely to have converted to SBP so quickly. Pt is having diarrhea. Recent antibiotics - cdiff ordered.   3:15 PM Lactate at 2.48. Broad spectrum antibiotics ordered. Lactate elevation is multifactorial - i dont think clinically pt has pneumonia. CXR results likely showing residual infiltrate  -no new cough, no pleuritic chest pain, no focal findings on lung exam.  CRITICAL CARE Performed by: Varney Biles   Total critical care time: 38 minutes  Critical care time was exclusive of separately billable procedures and treating other patients.  Critical care was necessary to treat or prevent imminent or life-threatening deterioration.  Critical care was time spent personally by me on the following activities: development of treatment plan with patient and/or surrogate as well as nursing, discussions with consultants, evaluation of patient's response to treatment, examination of patient, obtaining history from patient or surrogate, ordering and performing treatments and interventions, ordering and review of laboratory studies, ordering and review of radiographic studies, pulse oximetry and re-evaluation of patient's condition.    Varney Biles, MD 02/07/16 1520

## 2016-02-07 NOTE — Progress Notes (Signed)
CRITICAL VALUE ALERT  Critical value received:  Lactic acid 2.5  Date of notification:  02/07/2016  Time of notification:  2348  Critical value read back:Yes.    Nurse who received alert:  J.Essex Perry  MD notified (1st page):  K.Schorr  Time of first page:  2353  MD notified (2nd page):  Time of second page:  Responding MD:  K.Schorr  Time MD responded:  [id number], placed order for 500cc bolus

## 2016-02-07 NOTE — ED Notes (Signed)
Bed: WA07 Expected date:  Expected time:  Means of arrival:  Comments: Ems-elderly low hgb <7 recent pneumonia.

## 2016-02-07 NOTE — ED Notes (Signed)
ASSUMED CARE OF PT. AAOX3. PT IN NO APPARENT DISTRESS OR PAIN. AWAITING FURTHER ORDERS. 

## 2016-02-08 DIAGNOSIS — A419 Sepsis, unspecified organism: Principal | ICD-10-CM

## 2016-02-08 DIAGNOSIS — T451X5A Adverse effect of antineoplastic and immunosuppressive drugs, initial encounter: Secondary | ICD-10-CM

## 2016-02-08 DIAGNOSIS — I10 Essential (primary) hypertension: Secondary | ICD-10-CM

## 2016-02-08 DIAGNOSIS — G620 Drug-induced polyneuropathy: Secondary | ICD-10-CM

## 2016-02-08 DIAGNOSIS — J189 Pneumonia, unspecified organism: Secondary | ICD-10-CM | POA: Insufficient documentation

## 2016-02-08 DIAGNOSIS — E872 Acidosis: Secondary | ICD-10-CM

## 2016-02-08 LAB — GASTROINTESTINAL PANEL BY PCR, STOOL (REPLACES STOOL CULTURE)

## 2016-02-08 LAB — BASIC METABOLIC PANEL
ANION GAP: 11 (ref 5–15)
BUN: 23 mg/dL — ABNORMAL HIGH (ref 6–20)
CALCIUM: 7.5 mg/dL — AB (ref 8.9–10.3)
CO2: 23 mmol/L (ref 22–32)
Chloride: 108 mmol/L (ref 101–111)
Creatinine, Ser: 1.86 mg/dL — ABNORMAL HIGH (ref 0.44–1.00)
GFR, EST AFRICAN AMERICAN: 32 mL/min — AB (ref >60)
GFR, EST NON AFRICAN AMERICAN: 27 mL/min — AB (ref >60)
Glucose, Bld: 222 mg/dL — ABNORMAL HIGH (ref 65–99)
POTASSIUM: 3.8 mmol/L (ref 3.5–5.1)
SODIUM: 142 mmol/L (ref 135–145)

## 2016-02-08 LAB — PROCALCITONIN: PROCALCITONIN: 16.42 ng/mL

## 2016-02-08 LAB — CBC
HEMATOCRIT: 27.7 % — AB (ref 36.0–46.0)
HEMOGLOBIN: 9 g/dL — AB (ref 12.0–15.0)
MCH: 28.2 pg (ref 26.0–34.0)
MCHC: 32.5 g/dL (ref 30.0–36.0)
MCV: 86.8 fL (ref 78.0–100.0)
Platelets: 214 10*3/uL (ref 150–400)
RBC: 3.19 MIL/uL — ABNORMAL LOW (ref 3.87–5.11)
RDW: 15.8 % — ABNORMAL HIGH (ref 11.5–15.5)
WBC: 19.7 10*3/uL — AB (ref 4.0–10.5)

## 2016-02-08 LAB — GLUCOSE, CAPILLARY
GLUCOSE-CAPILLARY: 175 mg/dL — AB (ref 65–99)
Glucose-Capillary: 138 mg/dL — ABNORMAL HIGH (ref 65–99)
Glucose-Capillary: 174 mg/dL — ABNORMAL HIGH (ref 65–99)

## 2016-02-08 LAB — STREP PNEUMONIAE URINARY ANTIGEN: STREP PNEUMO URINARY ANTIGEN: NEGATIVE

## 2016-02-08 LAB — C DIFFICILE QUICK SCREEN W PCR REFLEX
C DIFFICILE (CDIFF) TOXIN: NEGATIVE
C DIFFICLE (CDIFF) ANTIGEN: NEGATIVE
C Diff interpretation: NEGATIVE

## 2016-02-08 LAB — LACTIC ACID, PLASMA
LACTIC ACID, VENOUS: 1.4 mmol/L (ref 0.5–2.0)
LACTIC ACID, VENOUS: 2 mmol/L (ref 0.5–2.0)

## 2016-02-08 MED ORDER — INSULIN ASPART 100 UNIT/ML ~~LOC~~ SOLN
4.0000 [IU] | Freq: Once | SUBCUTANEOUS | Status: AC
  Administered 2016-02-08: 4 [IU] via SUBCUTANEOUS

## 2016-02-08 MED ORDER — MAGNESIUM SULFATE 2 GM/50ML IV SOLN
2.0000 g | Freq: Once | INTRAVENOUS | Status: AC
  Administered 2016-02-08: 2 g via INTRAVENOUS
  Filled 2016-02-08: qty 50

## 2016-02-08 MED ORDER — PHENOL 1.4 % MT LIQD
1.0000 | OROMUCOSAL | Status: DC | PRN
  Administered 2016-02-08: 1 via OROMUCOSAL
  Filled 2016-02-08: qty 177

## 2016-02-08 NOTE — Progress Notes (Signed)
CSW spoke with patient's daughter, Brittany Schwartz at bedside re: discharge planning. Patient was discharged to [facility] on 2/28 and family transferred patient to [facility] for private room. Patient's daughter is hopeful that patient will be able to return home with home health at discharge this hospitalization. PT has been ordered - CSW will await recommendation. CSW provided daughter with list of facilities (daughter would like to see what other facilities are out there). CSW sent information out to [facility] in anticipation of SNF recommendation.    Raynaldo Opitz, [facility] Clinical Social Worker cell #: [phone]

## 2016-02-08 NOTE — NC FL2 (Signed)
[place] LEVEL OF CARE SCREENING TOOL     IDENTIFICATION  Patient Name: Brittany Schwartz Birthdate: [birth year] Sex: female Admission Date (Current Location): 02/07/2016  [facility] and [place] Number:  Herbalist and Address:  [facility],  [place] [address] [place]      Provider Number: [id number]  Attending Physician Name and Address:  Vianne Bulls, MD  Relative Name and Phone Number:       Current Level of Care: Hospital Recommended Level of Care: [place] Prior Approval Number:    Date Approved/Denied:   PASRR Number: [id number]  Discharge Plan: SNF    Current Diagnoses: Patient Active Problem List   Diagnosis Date Noted  . HCAP (healthcare-associated pneumonia)   . GI bleed 02/07/2016  . Bilateral pneumonia 02/07/2016  . Sepsis due to pneumonia ([place]) 02/07/2016  . Protein-calorie malnutrition, severe ([place]) 01/09/2016  . AKI (acute kidney injury) ([place]) 01/04/2016  . Elevated lactic acid level 01/04/2016  . Malignant ascites   . Non-insulin dependent type 2 diabetes mellitus ([place])   . Hydronephrosis, left 12/29/2015  . Chemotherapy-induced peripheral neuropathy ([place]) 04/14/2015  . Anemia associated with chemotherapy 04/14/2015  . Essential hypertension 02/24/2015  . Endometrial cancer ([place]) 01/09/2015    Orientation RESPIRATION BLADDER Height & Weight     Self, Time, Situation, Place  Normal Continent Weight: 136 lb 7.4 oz (61.9 kg) Height:  5\' 1"  (154.9 cm)  BEHAVIORAL SYMPTOMS/MOOD NEUROLOGICAL BOWEL NUTRITION STATUS      Continent Diet (Regular)  AMBULATORY STATUS COMMUNICATION OF NEEDS Skin   Extensive Assist Verbally Surgical wounds (Incision(Closed)02/10/17AbdomenLeft & Incision(Closed)02/11/17PerineumOther(Comment))                       Personal Care Assistance Level of Assistance  Bathing, Dressing Bathing Assistance: Maximum assistance   Dressing Assistance: Maximum  assistance     Functional Limitations Info             SPECIAL CARE FACTORS FREQUENCY  PT (By licensed PT), OT (By licensed OT)     PT Frequency: 5 OT Frequency: 5            Contractures      Additional Factors Info  Code Status, Allergies, Insulin Sliding Scale Code Status Info: Fullcode Allergies Info: NKDA           Current Medications (02/08/2016):  This is the current hospital active medication list Current Facility-Administered Medications  Medication Dose Route Frequency Provider Last Rate Last Dose  . 0.9 %  sodium chloride infusion  10 mL/hr Intravenous Once Varney Biles, MD      . acetaminophen (TYLENOL) tablet 650 mg  650 mg Oral Q6H PRN Theodis Blaze, MD      . albuterol (PROVENTIL) (2.5 MG/3ML) 0.083% nebulizer solution 2.5 mg  2.5 mg Nebulization Q6H PRN Theodis Blaze, MD      . ceFEPIme (MAXIPIME) 1 g in dextrose 5 % 50 mL IVPB  1 g Intravenous Q24H Donald Prose Runyon, RPH   1 g at 02/07/16 2216  . enoxaparin (LOVENOX) injection 30 mg  30 mg Subcutaneous Q24H Theodis Blaze, MD   30 mg at 02/07/16 2215  . ferrous fumarate (HEMOCYTE - 106 mg FE) tablet 106 mg of iron  1 tablet Oral BID Theodis Blaze, MD   106 mg of iron at 02/08/16 1035  . insulin aspart (novoLOG) injection 0-9 Units  0-9 Units Subcutaneous TID WC Iskra M  Doyle Askew, MD   1 Units at 02/08/16 1316  . latanoprost (XALATAN) 0.005 % ophthalmic solution 1 drop  1 drop Both Eyes QHS Theodis Blaze, MD   1 drop at 02/07/16 2216  . LORazepam (ATIVAN) tablet 0.5 mg  0.5 mg Oral Q6H PRN Theodis Blaze, MD      . midodrine (PROAMATINE) tablet 10 mg  10 mg Oral TID WC Theodis Blaze, MD   10 mg at 02/08/16 1317  . nystatin (MYCOSTATIN) 100000 UNIT/ML suspension 500,000 Units  5 mL Oral QID Theodis Blaze, MD   500,000 Units at 02/08/16 1035  . ondansetron (ZOFRAN) tablet 4 mg  4 mg Oral Q6H PRN Theodis Blaze, MD       Or  . ondansetron [facility]) injection 4 mg  4 mg Intravenous Q6H PRN Theodis Blaze, MD      .  oxyCODONE (Oxy IR/ROXICODONE) immediate release tablet 5 mg  5 mg Oral Q3H PRN Theodis Blaze, MD   5 mg at 02/08/16 0438  . phenol (CHLORASEPTIC) mouth spray 1 spray  1 spray Mouth/Throat PRN Jeryl Columbia, NP   1 spray at 02/08/16 0036  . pravastatin (PRAVACHOL) tablet 20 mg  20 mg Oral Daily Theodis Blaze, MD   20 mg at 02/08/16 1035  . sodium chloride flush (NS) 0.9 % injection 10-40 mL  10-40 mL Intracatheter PRN Theodis Blaze, MD      . sodium chloride flush (NS) 0.9 % injection 3 mL  3 mL Intravenous Q12H Theodis Blaze, MD   3 mL at 02/08/16 1041  . timolol (TIMOPTIC) 0.5 % ophthalmic solution 1 drop  1 drop Both Eyes BID Theodis Blaze, MD   1 drop at 02/08/16 1038  . vancomycin (VANCOCIN) IVPB 750 mg/150 ml premix  750 mg Intravenous Q24H Dara Hoyer, [facility]         Discharge Medications: Please see discharge summary for a list of discharge medications.  Relevant Imaging Results:  Relevant Lab Results:   Additional Information SS#: [id number]  Standley Brooking, LCSW

## 2016-02-08 NOTE — Progress Notes (Signed)
[facility] Progress Note  Chanice Snoke [id number] DOB: 02/27/1951 DOA: 02/07/2016  Referring physician: ED physician, Kathrynn Humble  PCP: [name], MD   Chief Complaint: blood counts low   HPI (From H&P by Dr. Doyle Askew):  Pt is 65 yo female with grade 3 serous endometrial ca, adjuvant chemo completed 06/16/15, now with rapid recurrent disease since mid January with large volume malignant ascites, bulky involvement pelvis and abdominal caking. She is followed by Dr. Marko Plume with oncology for ongoing chemo, last dose given 01/10/16. Recently admitted in February with shortness of breath, was intubated (extubated 01/14/2016) and required CVVHD, completed 10 days of vanc and zosyn, was also on stress dose steroids until 01/24/2016, was sent from the rehab with anemia and elevated WBC. Pt reports noticing weakness but denies any specific symptoms such as chest pain or shortness of breath, no fevers, chills, no specific abd or urinary concerns.   In ED, pt noted to be hemodynamically stable, VSS, blood work notable for WBC 23 K, Hg 6, Cr 1.96, TRH asked to admit for further evaluation. Off note, pt just had paracentesis done 02/06/2016 and had 2.8 L fluid removed but the fluid was not sent for analysis.   Subjective:  Stomach pain overnight improved with a Norco. Feeling a little better following blood transfusion.   Assessment and plan:  Principal Problem:  Sepsis due to Bilateral pneumonia - sepsis and pneumonia order set in place - unable to entirely exclude GI pathology - we have also requested GI stool panel and C. DIff given recent broad spectrum abx use - pt however denies any specific abd concerns and has not had diarrhea today so not sure if this could be etiology but will certainly keep on it and if any diarrhea, will need to rule out C. Diff  - agree with ABX chosen by ED doctor, cont cefepime and vanc day #2  - follow up on lactic acid and procalcitonin; LA up some last night,  was bolused 500cc, repeat pending - narrow ABX as quickly as possible   Active Problems:  Anemia associated with chemotherapy / GI bleed - transfuse two unit of PRBC, post-transfusion H/H pending - FOBT negative  - CBC in AM   AKI (acute kidney injury) ([place]) - based on record review Cr is trending down 3.8---> 2.82 --> 1.96 -> 2.3     Endometrial cancer ([place]) - notify Dr. Marko Plume of pt's admission    Malignant ascites - s/p paracentesis 3/7, 2.8 L fluid removed - not sent for analysis so not clear if SBP   Essential hypertension - reasonably stable on admission    Chemotherapy-induced peripheral neuropathy ([place])   Non-insulin dependent type 2 diabetes mellitus ([place]) - continue home medical regimen  - also place on SSI   Protein-calorie malnutrition, severe ([place]) - Significant hypoalbuminemia with low protein stores noted.   Lovenox SQ for DVT prophylaxis   Radiological Exams on Admission: Dg Chest 2 View 02/07/2016 Bibasilar opacities concerning for pneumonia. Small effusions. No real change since prior study.   US Paracentesis 02/06/2016 ISuccessful ultrasound-guided therapeutic paracentesis yielding 2.8 liters of peritoneal fluid.   Code Status: Full Family Communication: Pt and family at bedside Disposition Plan: Admit for further evaluation   Mart Piggs [facility] [id number]   Review of Systems:  Constitutional: Negative for diaphoresis.  HENT: Negative for hearing loss, ear pain, nosebleeds, congestion, sore throat, neck pain, tinnitus and ear discharge.  Eyes: Negative for blurred vision, double vision, photophobia, pain, discharge and redness.  Respiratory:  Negative for cough, wheezing and stridor.  Cardiovascular: Negative for chest pain, palpitations, orthopnea, claudication and leg swelling.  Gastrointestinal: Per HPI  Genitourinary: Negative for dysuria, urgency, frequency, hematuria and flank pain.  Musculoskeletal: Negative for  myalgias, back pain, joint pain and falls.  Skin: Negative for itching and rash.  Neurological: Negative for dizziness and weakness. Endo/Heme/Allergies: Negative for environmental allergies and polydipsia. Does not bruise/bleed easily.  Psychiatric/Behavioral: Negative for suicidal ideas. The patient is not nervous/anxious.    Past Medical History  Diagnosis Date  . Hypertension   . Glaucoma   . Diabetes mellitus without complication ([place])   . Palpitations   . Muscle cramps   . Postmenopausal vaginal bleeding   . Uterine cancer [facility])     Past Surgical History  Procedure Laterality Date  . Robotic assisted total hysterectomy with bilateral salpingo oopherectomy Bilateral 01/19/2015    Procedure: XI ROBOTIC ASSISTED TOTAL HYSTERECTOMY WITH BILATERAL SALPINGO OOPHORECTOMY; Surgeon: [name], MD; Location: [facility]; Service: Gynecology; Laterality: Bilateral;  . Cystoscopy w/ ureteral stent placement Bilateral 01/13/2016    Procedure: CYSTOSCOPY WITH RETROGRADE PYELOGRAM/BILATERAL URETERAL STENT PLACEMENT; Surgeon: [name], MD; Location: [facility]; Service: Urology; Laterality: Bilateral;    Social History:  reports that she has never smoked. She has never used smokeless tobacco. She reports that she does not drink alcohol or use illicit drugs.  No Known Allergies  Family History  Problem Relation Age of Onset  . Diabetes Father   . Arrhythmia Mother     Prior to Admission medications   Medication Sig Start Date End Date Taking? Authorizing Provider  acetaminophen (TYLENOL) 325 MG tablet Take 650 mg by mouth every 6 (six) hours as needed for mild pain, moderate pain or headache. Reported on 01/08/2016   Yes Historical Provider, MD  albuterol (PROVENTIL) (2.5 MG/3ML) 0.083% nebulizer solution Take 2.5 mg by nebulization every 6 (six) hours as needed for wheezing or shortness of breath.    Yes Historical Provider, MD  cefTRIAXone (ROCEPHIN) 1 g injection Inject 1 g into the muscle daily.   Yes Historical Provider, MD  feeding supplement, GLUCERNA SHAKE, (GLUCERNA SHAKE) LIQD Take 237 mLs by mouth daily. Patient taking differently: Take 120 mLs by mouth 2 (two) times daily.  01/04/16  Yes Albertine Patricia, MD  ferrous fumarate (HEMOCYTE - 106 MG FE) 325 (106 FE) MG TABS tablet Take 1 tablet daily on an empty stomach with OJ or vitamin C tablet 09/11/15  Yes [name], MD  latanoprost (XALATAN) 0.005 % ophthalmic solution Place 1 drop into both eyes at bedtime.   Yes Historical Provider, MD  lidocaine-prilocaine (EMLA) cream Apply to Porta-Cath 1-2 hrs prior to access as directed. 01/02/16  Yes [name], MD  LORazepam (ATIVAN) 0.5 MG tablet Place 1 tablet under tongue or swallow every 8 hours as needed for nausea. Will make drowsy. 01/30/16  Yes [name], MD  midodrine (PROAMATINE) 10 MG tablet Take 1 tablet (10 mg total) by mouth 3 (three) times daily with meals. 01/30/16  Yes Christina P Rama, MD  moxifloxacin (AVELOX) 400 MG tablet Take 400 mg by mouth daily at 8 pm.   Yes Historical Provider, MD  Nutritional Supplements (RA NUTRITIONAL SUPPLEMENT PO) Take 1 Units by mouth 2 (two) times daily.   Yes Historical Provider, MD  nystatin (MYCOSTATIN) 100000 UNIT/ML suspension Take 5 mLs (500,000 Units total) by mouth 4 (four) times daily. 01/30/16  Yes [name], MD  ondansetron (ZOFRAN) 8 MG  tablet Take 1 tablet by mouth every 8 hours as needed for nausea. Will not make drowsy. 01/02/16  Yes [name], MD  oxyCODONE (ROXICODONE) 5 MG immediate release tablet Take 1 tablet every 4-6 hours as needed for pain. 01/30/16  Yes Christina P Rama, MD  pantoprazole (PROTONIX) 40 MG tablet Take 1 tablet (40 mg total) by mouth daily. 11/20/15  Yes Everitt Amber, MD  pravastatin (PRAVACHOL) 20  MG tablet Take 20 mg by mouth daily.   Yes Historical Provider, MD  timolol (BETIMOL) 0.5 % ophthalmic solution Place 1 drop into both eyes 2 (two) times daily.   Yes Historical Provider, MD  glipiZIDE (GLUCOTROL XL) 5 MG 24 hr tablet Take 5 mg by mouth 3 (three) times daily. 12/12/15   Historical Provider, MD  insulin lispro (HUMALOG KWIKPEN) 100 UNIT/ML KiwkPen Inject into the skin. Inject per sliding scale: if 0-140 =0; 150-600=3, subcutaneously before meals    Historical Provider, MD  polyethylene glycol (MIRALAX / GLYCOLAX) packet Take 17 g by mouth daily as needed for mild constipation. Patient not taking: Reported on 02/07/2016 01/30/16   [name], MD    Physical Exam: Filed Vitals:   02/07/16 1152 02/07/16 1403  BP: 129/75   Pulse: 99   Temp: 98.4 F (36.9 C)   TempSrc: Oral   Resp: 16   Height:  5\' 1"  (1.549 m)  Weight:  66.679 kg (147 lb)  SpO2: 98%     Physical Exam  Constitutional: Appears well-developed and well-nourished. No distress. Husband and daughter at bedside.  HENT: Normocephalic. External right and left ear normal. Dry MM Eyes: Conjunctivae and EOM are normal. PERRLA, no scleral icterus.  Neck: Normal ROM. Neck supple. No JVD. No tracheal deviation. No thyromegaly.  CVS: RRR, no gallops, no carotid bruit.  Pulmonary: Effort and breath sounds normal, no stridor, rhonchi, wheezes, rales.  Abdominal: Soft. BS +, mild distension, tenderness, rebound or guarding.  Musculoskeletal: Normal range of motion.  Lymphadenopathy: No lymphadenopathy noted, cervical, inguinal. Neuro: Alert. Normal reflexes, muscle tone coordination. No cranial nerve deficit. Skin: Skin is warm and dry. No rash noted. Not diaphoretic. No erythema. No pallor.  Psychiatric: Normal mood and affect. Behavior, judgment, thought content normal.   Labs on Admission:  Basic Metabolic Panel:  Last Labs      Recent Labs Lab  02/04/16 02/07/16 1224  NA 145 142  K 5.0 4.2  CL --  109  CO2 --  22  GLUCOSE --  270*  BUN 32* 28*  CREATININE 2.3* 1.96*  CALCIUM --  7.5*     CBC:  Last Labs      Recent Labs Lab 02/07/16 1224  WBC 23.4*  NEUTROABS 22.7*  HGB 6.0*  HCT 18.5*  MCV 89.4  PLT 254     EKG: pending

## 2016-02-09 LAB — GLUCOSE, CAPILLARY
GLUCOSE-CAPILLARY: 162 mg/dL — AB (ref 65–99)
GLUCOSE-CAPILLARY: 198 mg/dL — AB (ref 65–99)
GLUCOSE-CAPILLARY: 107 mg/dL — AB (ref 65–99)
Glucose-Capillary: 347 mg/dL — ABNORMAL HIGH (ref 65–99)
Glucose-Capillary: 202 mg/dL — ABNORMAL HIGH (ref 65–99)

## 2016-02-09 LAB — BASIC METABOLIC PANEL
Anion gap: 12 (ref 5–15)
BUN: 20 mg/dL (ref 6–20)
CALCIUM: 7.9 mg/dL — AB (ref 8.9–10.3)
CO2: 23 mmol/L (ref 22–32)
Chloride: 110 mmol/L (ref 101–111)
Creatinine, Ser: 1.76 mg/dL — ABNORMAL HIGH (ref 0.44–1.00)
GFR calc Af Amer: 34 mL/min — ABNORMAL LOW (ref >60)
GFR calc non Af Amer: 29 mL/min — ABNORMAL LOW (ref >60)
Glucose, Bld: 144 mg/dL — ABNORMAL HIGH (ref 65–99)
Potassium: 3.9 mmol/L (ref 3.5–5.1)
SODIUM: 145 mmol/L (ref 135–145)

## 2016-02-09 LAB — TYPE AND SCREEN
ABO/RH(D): B POS
Antibody Screen: NEGATIVE
Unit division: 0
Unit division: 0

## 2016-02-09 NOTE — Progress Notes (Signed)
[facility] Progress Note  Brittany Schwartz [id number] DOB: Feb 13, 1951 DOA: 02/07/2016  Referring physician: ED physician, Kathrynn Humble  PCP: [name], MD   Chief Complaint: blood counts low   HPI (From H&P by Dr. Doyle Askew):  Pt is 65 yo female with grade 3 serous endometrial ca, adjuvant chemo completed 06/16/15, now with rapid recurrent disease since mid January with large volume malignant ascites, bulky involvement pelvis and abdominal caking. She is followed by Dr. Marko Plume with oncology for ongoing chemo, last dose given 01/10/16. Recently admitted in February with shortness of breath, was intubated (extubated 01/14/2016) and required CVVHD, completed 10 days of vanc and zosyn, was also on stress dose steroids until 01/24/2016, was sent from the rehab with anemia and elevated WBC. Pt reports noticing weakness but denies any specific symptoms such as chest pain or shortness of breath, no fevers, chills, no specific abd or urinary concerns.   In ED, pt noted to be hemodynamically stable, VSS, blood work notable for WBC 23 K, Hg 6, Cr 1.96, TRH asked to admit for further evaluation. Off note, pt just had paracentesis done 02/06/2016 and had 2.8 L fluid removed but the fluid was not sent for analysis.   Subjective:  Stomach pain overnight improved with a Norco. Feeling a little better following blood transfusion.   Assessment and plan:  Principal Problem:  Sepsis due to Bilateral pneumonia - sepsis and pneumonia order set in place - unable to entirely exclude GI pathology - we have also requested GI stool panel and C. DIff given recent broad spectrum abx use - pt however denies any specific abd concerns and has not had diarrhea today so not sure if this could be etiology but will certainly keep on it and if any diarrhea, will need to rule out C. Diff  - Chest x-ray performed on 02/07/2016 revealing bibasilar opacities consistent with pneumonia. -She remains on broad-spectrum IV antibiotic  coverage with vancomycin and cefepime. -Plan to repeat chest x-ray in a.m., narrow antimicrobial regimen if x-ray is stable  Active Problems:  Anemia associated with chemotherapy / GI bleed -Labs on 02/07/2016 showed hemoglobin of 6.0, hemoglobin improved to 9.0 after receiving 2 units of packed red blood cells. -Will continue to monitor   AKI (acute kidney injury) ([place]) -Creatinine slowly trending down 1.76 <1.86< 1.96< 2.3   Endometrial cancer ([place]) - notify Dr. Marko Plume of pt's admission    Malignant ascites - s/p paracentesis 3/7, 2.8 L fluid removed -Repeat chest x-ray in a.m.   Essential hypertension - reasonably stable on admission    Chemotherapy-induced peripheral neuropathy ([facility])   Non-insulin dependent type 2 diabetes mellitus ([place]) - continue home medical regimen  - also place on SSI   Protein-calorie malnutrition, severe ([place]) - Significant hypoalbuminemia with low protein stores noted.   Lovenox SQ for DVT prophylaxis   Radiological Exams on Admission: Dg Chest 2 View 02/07/2016 Bibasilar opacities concerning for pneumonia. Small effusions. No real change since prior study.   US Paracentesis 02/06/2016 ISuccessful ultrasound-guided therapeutic paracentesis yielding 2.8 liters of peritoneal fluid.   Code Status: Full Family Communication: Pt and family at bedside Disposition Plan: Admit for further evaluation   Mart Piggs [facility] [id number]   Review of Systems:  Constitutional: Negative for diaphoresis.  HENT: Negative for hearing loss, ear pain, nosebleeds, congestion, sore throat, neck pain, tinnitus and ear discharge.  Eyes: Negative for blurred vision, double vision, photophobia, pain, discharge and redness.  Respiratory: Negative for cough, wheezing and stridor.  Cardiovascular: Negative for chest  pain, palpitations, orthopnea, claudication and leg swelling.  Gastrointestinal: Per HPI  Genitourinary: Negative for dysuria, urgency,  frequency, hematuria and flank pain.  Musculoskeletal: Negative for myalgias, back pain, joint pain and falls.  Skin: Negative for itching and rash.  Neurological: Negative for dizziness and weakness. Endo/Heme/Allergies: Negative for environmental allergies and polydipsia. Does not bruise/bleed easily.  Psychiatric/Behavioral: Negative for suicidal ideas. The patient is not nervous/anxious.    Past Medical History  Diagnosis Date  . Hypertension   . Glaucoma   . Diabetes mellitus without complication ([place])   . Palpitations   . Muscle cramps   . Postmenopausal vaginal bleeding   . Uterine cancer [facility])     Past Surgical History  Procedure Laterality Date  . Robotic assisted total hysterectomy with bilateral salpingo oopherectomy Bilateral 01/19/2015    Procedure: XI ROBOTIC ASSISTED TOTAL HYSTERECTOMY WITH BILATERAL SALPINGO OOPHORECTOMY; Surgeon: [name], MD; Location: [facility]; Service: Gynecology; Laterality: Bilateral;  . Cystoscopy w/ ureteral stent placement Bilateral 01/13/2016    Procedure: CYSTOSCOPY WITH RETROGRADE PYELOGRAM/BILATERAL URETERAL STENT PLACEMENT; Surgeon: [name], MD; Location: [facility]; Service: Urology; Laterality: Bilateral;    Social History:  reports that she has never smoked. She has never used smokeless tobacco. She reports that she does not drink alcohol or use illicit drugs.  No Known Allergies  Family History  Problem Relation Age of Onset  . Diabetes Father   . Arrhythmia Mother     Prior to Admission medications   Medication Sig Start Date End Date Taking? Authorizing Provider  acetaminophen (TYLENOL) 325 MG tablet Take 650 mg by mouth every 6 (six) hours as needed for mild pain, moderate pain or headache. Reported on 01/08/2016   Yes Historical Provider, MD  albuterol (PROVENTIL) (2.5 MG/3ML) 0.083% nebulizer solution Take 2.5 mg by nebulization every  6 (six) hours as needed for wheezing or shortness of breath.   Yes Historical Provider, MD  cefTRIAXone (ROCEPHIN) 1 g injection Inject 1 g into the muscle daily.   Yes Historical Provider, MD  feeding supplement, GLUCERNA SHAKE, (GLUCERNA SHAKE) LIQD Take 237 mLs by mouth daily. Patient taking differently: Take 120 mLs by mouth 2 (two) times daily.  01/04/16  Yes Albertine Patricia, MD  ferrous fumarate (HEMOCYTE - 106 MG FE) 325 (106 FE) MG TABS tablet Take 1 tablet daily on an empty stomach with OJ or vitamin C tablet 09/11/15  Yes [name], MD  latanoprost (XALATAN) 0.005 % ophthalmic solution Place 1 drop into both eyes at bedtime.   Yes Historical Provider, MD  lidocaine-prilocaine (EMLA) cream Apply to Porta-Cath 1-2 hrs prior to access as directed. 01/02/16  Yes [name], MD  LORazepam (ATIVAN) 0.5 MG tablet Place 1 tablet under tongue or swallow every 8 hours as needed for nausea. Will make drowsy. 01/30/16  Yes [name], MD  midodrine (PROAMATINE) 10 MG tablet Take 1 tablet (10 mg total) by mouth 3 (three) times daily with meals. 01/30/16  Yes Christina P Rama, MD  moxifloxacin (AVELOX) 400 MG tablet Take 400 mg by mouth daily at 8 pm.   Yes Historical Provider, MD  Nutritional Supplements (RA NUTRITIONAL SUPPLEMENT PO) Take 1 Units by mouth 2 (two) times daily.   Yes Historical Provider, MD  nystatin (MYCOSTATIN) 100000 UNIT/ML suspension Take 5 mLs (500,000 Units total) by mouth 4 (four) times daily. 01/30/16  Yes Christina P Rama, MD  ondansetron (ZOFRAN) 8 MG tablet Take 1 tablet by mouth every 8 hours as needed  for nausea. Will not make drowsy. 01/02/16  Yes [name], MD  oxyCODONE (ROXICODONE) 5 MG immediate release tablet Take 1 tablet every 4-6 hours as needed for pain. 01/30/16  Yes Christina P Rama, MD  pantoprazole (PROTONIX) 40 MG tablet Take 1 tablet (40 mg total) by mouth  daily. 11/20/15  Yes Everitt Amber, MD  pravastatin (PRAVACHOL) 20 MG tablet Take 20 mg by mouth daily.   Yes Historical Provider, MD  timolol (BETIMOL) 0.5 % ophthalmic solution Place 1 drop into both eyes 2 (two) times daily.   Yes Historical Provider, MD  glipiZIDE (GLUCOTROL XL) 5 MG 24 hr tablet Take 5 mg by mouth 3 (three) times daily. 12/12/15   Historical Provider, MD  insulin lispro (HUMALOG KWIKPEN) 100 UNIT/ML KiwkPen Inject into the skin. Inject per sliding scale: if 0-140 =0; 150-600=3, subcutaneously before meals    Historical Provider, MD  polyethylene glycol (MIRALAX / GLYCOLAX) packet Take 17 g by mouth daily as needed for mild constipation. Patient not taking: Reported on 02/07/2016 01/30/16   [name], MD    Physical Exam: Filed Vitals:   02/07/16 1152 02/07/16 1403  BP: 129/75   Pulse: 99   Temp: 98.4 F (36.9 C)   TempSrc: Oral   Resp: 16   Height:  5\' 1"  (1.549 m)  Weight:  66.679 kg (147 lb)  SpO2: 98%     Physical Exam  Constitutional: Appears well-developed and well-nourished. No distress. Husband and daughter at bedside.  HENT: Normocephalic. External right and left ear normal. Dry MM Eyes: Conjunctivae and EOM are normal. PERRLA, no scleral icterus.  Neck: Normal ROM. Neck supple. No JVD. No tracheal deviation. No thyromegaly.  CVS: RRR, no gallops, no carotid bruit.  Pulmonary: Effort and breath sounds normal, no stridor, rhonchi, wheezes, rales.  Abdominal: Soft. BS +, mild distension, tenderness, rebound or guarding.  Musculoskeletal: Normal range of motion.  Lymphadenopathy: No lymphadenopathy noted, cervical, inguinal. Neuro: Alert. Normal reflexes, muscle tone coordination. No cranial nerve deficit. Skin: Skin is warm and dry. No rash noted. Not diaphoretic. No erythema. No pallor.  Psychiatric: Normal mood and affect. Behavior, judgment, thought content normal.   Labs on  Admission:  Basic Metabolic Panel:  Last Labs      Recent Labs Lab 02/04/16 02/07/16 1224  NA 145 142  K 5.0 4.2  CL --  109  CO2 --  22  GLUCOSE --  270*  BUN 32* 28*  CREATININE 2.3* 1.96*  CALCIUM --  7.5*     CBC:  Last Labs      Recent Labs Lab 02/07/16 1224  WBC 23.4*  NEUTROABS 22.7*  HGB 6.0*  HCT 18.5*  MCV 89.4  PLT 254     EKG: pending

## 2016-02-09 NOTE — Evaluation (Addendum)
Physical Therapy Evaluation Patient Details Name: Brittany Schwartz MRN: [id number] DOB: June 04, 1951 Today's Date: 02/09/2016   History of Present Illness  65 yo female admitted with bil PNA. hx of endometrial cancer, malignant ascites. Recent d/c to [facility] 2/28.   Clinical Impression  On eval, pt required Mod assist to stand, Min assist to ambulate ~50 feet with RW. Pt remains generally weak but appeared to tolerate activity fairly well. Family present during session. Discussed d/c plan-family would like pt to be a little stronger before discharge. Ultimately they would like to take pt home at discharge. Will follow and progress activity as able.     Follow Up Recommendations Home health PT;Supervision/Assistance - 24 hour vs SNF (depending on progress)    Equipment Recommendations  Rolling walker with 5" wheels;3in; Wheelchair; (husband requesting hospital bed)    Recommendations for Other Services       Precautions / Restrictions Precautions Precautions: Fall Restrictions Weight Bearing Restrictions: No      Mobility  Bed Mobility Overal bed mobility: Needs Assistance Bed Mobility: Supine to Sit     Supine to sit: Min assist;HOB elevated     General bed mobility comments: Assist to get to EOB. Increased time.   Transfers Overall transfer level: Needs assistance Equipment used: Rolling walker (2 wheeled) Transfers: Sit to/from Stand Sit to Stand: Mod assist         General transfer comment: Assist to rise, stabilize, control descent. VCs safety, technique, hand placement  Ambulation/Gait Ambulation/Gait assistance: Min assist Ambulation Distance (Feet): 50 Feet Assistive device: Rolling walker (2 wheeled) Gait Pattern/deviations: Step-through pattern;Decreased stride length     General Gait Details: Assist to stabilize pt. Pt tolerated short distance fairly well. VCs safety.   Stairs            Wheelchair Mobility    Modified Rankin (Stroke Patients  Only)       Balance Overall balance assessment: Needs assistance         Standing balance support: Bilateral upper extremity supported;During functional activity Standing balance-Leahy Scale: Poor                               Pertinent Vitals/Pain Pain Assessment: No/denies pain    Home Living Family/patient expects to be discharged to:: Unsure Living Arrangements: Spouse/significant other;Other relatives Available Help at Discharge: Family Type of Home:  (Apartment vs Motel???) Home Access: Level entry     Home Layout: One level Home Equipment: None      Prior Function Level of Independence: Independent               Hand Dominance        Extremity/Trunk Assessment   Upper Extremity Assessment: Generalized weakness           Lower Extremity Assessment: Generalized weakness (Strength ~3+/5)      Cervical / Trunk Assessment: Normal  Communication   Communication: No difficulties  Cognition Arousal/Alertness: Awake/alert Behavior During Therapy: WFL for tasks assessed/performed Overall Cognitive Status: Within Functional Limits for tasks assessed                      General Comments      Exercises        Assessment/Plan    PT Assessment Patient needs continued PT services  PT Diagnosis Difficulty walking;Generalized weakness   PT Problem List Decreased strength;Decreased activity tolerance;Decreased balance;Decreased mobility;Decreased knowledge of use of DME  PT Treatment Interventions DME instruction;Gait training;Functional mobility training;Therapeutic activities;Patient/family education;Balance training;Therapeutic exercise   PT Goals (Current goals can be found in the Care Plan section) Acute Rehab PT Goals Patient Stated Goal: to get stronger, PT Goal Formulation: With patient/family Time For Goal Achievement: 02/23/16 Potential to Achieve Goals: Fair    Frequency Min 3X/week   Barriers to discharge         Co-evaluation               End of Session Equipment Utilized During Treatment: Gait belt Activity Tolerance: Patient tolerated treatment well Patient left: in chair;with call bell/phone within reach;with chair alarm set;with family/visitor present           Time: [id number] PT Time Calculation (min) (ACUTE ONLY): 22 min   Charges:   PT Evaluation $PT Eval Moderate Complexity: 1 Procedure     PT G Codes:        Weston Anna, MPT Pager: [phone]

## 2016-02-09 NOTE — Progress Notes (Signed)
Assumed care of pt from [place], [place] at approximately 1515.  Pt in stable condition.  I agree with previous RN assessment.  Will continue with plan of care.

## 2016-02-10 ENCOUNTER — Inpatient Hospital Stay (HOSPITAL_COMMUNITY): Payer: BLUE CROSS/BLUE SHIELD

## 2016-02-10 LAB — BASIC METABOLIC PANEL
ANION GAP: 11 (ref 5–15)
BUN: 17 mg/dL (ref 6–20)
CALCIUM: 7.9 mg/dL — AB (ref 8.9–10.3)
CO2: 22 mmol/L (ref 22–32)
CREATININE: 1.67 mg/dL — AB (ref 0.44–1.00)
Chloride: 108 mmol/L (ref 101–111)
GFR, EST AFRICAN AMERICAN: 36 mL/min — AB (ref >60)
GFR, EST NON AFRICAN AMERICAN: 31 mL/min — AB (ref >60)
Glucose, Bld: 186 mg/dL — ABNORMAL HIGH (ref 65–99)
Potassium: 3.5 mmol/L (ref 3.5–5.1)
SODIUM: 141 mmol/L (ref 135–145)

## 2016-02-10 LAB — CBC
HCT: 28 % — ABNORMAL LOW (ref 36.0–46.0)
HEMOGLOBIN: 9.2 g/dL — AB (ref 12.0–15.0)
MCH: 28.9 pg (ref 26.0–34.0)
MCHC: 32.9 g/dL (ref 30.0–36.0)
MCV: 88.1 fL (ref 78.0–100.0)
PLATELETS: 228 10*3/uL (ref 150–400)
RBC: 3.18 MIL/uL — AB (ref 3.87–5.11)
RDW: 17 % — ABNORMAL HIGH (ref 11.5–15.5)
WBC: 20.4 10*3/uL — AB (ref 4.0–10.5)

## 2016-02-10 LAB — GLUCOSE, CAPILLARY
GLUCOSE-CAPILLARY: 171 mg/dL — AB (ref 65–99)
GLUCOSE-CAPILLARY: 152 mg/dL — AB (ref 65–99)
GLUCOSE-CAPILLARY: 228 mg/dL — AB (ref 65–99)
GLUCOSE-CAPILLARY: 167 mg/dL — AB (ref 65–99)

## 2016-02-10 LAB — VANCOMYCIN, TROUGH: VANCOMYCIN TR: 19 ug/mL (ref 10.0–20.0)

## 2016-02-10 MED ORDER — GUAIFENESIN-DM 100-10 MG/5ML PO SYRP
5.0000 mL | ORAL_SOLUTION | ORAL | Status: DC | PRN
  Administered 2016-02-10 – 2016-02-11 (×2): 5 mL via ORAL
  Filled 2016-02-10 (×2): qty 10

## 2016-02-10 NOTE — Progress Notes (Signed)
Pharmacy Antibiotic Note  Brittany Schwartz is a 65 y.o. female admitted on 02/07/2016 with pneumonia.  Pharmacy has been consulted for Vancomycin dosing as well as antibiotic renal dose adjustments.  Pt has a history of uterine cancer s/p resection, currently receiving chemo.  Cycle 1 completed 2/8.  Admitted from SNF for low Hgb <7.  Was started on Ceftriaxone 3/7 at facility for PNA.    SCr elevated on admission, now improved with CrCl ~ 28 ml/min.  WBC remaining elevated, CXR with b/l lower lung opacities, urine growing unimpressive amt of Enterococcus.  Plan: Check vancomycin level this afternoon. Continue Cefepime dose to 1g IV q24h.  Height: 5\' 1"  (154.9 cm) Weight: 136 lb 7.4 oz (61.9 kg) IBW/kg (Calculated) : 47.8  Temp (24hrs), Avg:97.8 F (36.6 C), Min:97.3 F (36.3 C), Max:98.2 F (36.8 C)   Recent Labs Lab 02/04/16 02/07/16 1224 02/07/16 1417 02/07/16 2305 02/08/16 0820 02/08/16 1045 02/09/16 0520 02/10/16 0450  WBC  --  23.4*  --   --  19.7*  --   --  20.4*  CREATININE 2.3* 1.96*  --   --  1.86*  --  1.76* 1.67*  LATICACIDVEN  --   --  2.48* 2.5* 1.4 2.0  --   --     Estimated Creatinine Clearance: 28.3 mL/min (by C-G formula based on Cr of 1.67).    No Known Allergies  Antimicrobials this admission: 3/8 Zosyn x 1 3/8 Vancomycin >>  3/8 Cefepime >>  Dose adjustments this admission: 3/11 1600 VT = ____ (on vanc 750 q24h prior to 4th dose)  Microbiology results: 3/8 BCx: ngtd 3/8 UCx: 10K Enterococcus 3/8 UCx: 50K Enterococcus 3/8 GI panel by PCR: none detected 3/8 sputum:  no result 3/8 MRSA PCR: negative 3/8 strep pneumo ur ag: negative 3/9 Cdiff: neg toxin, neg Ag  Thank you for allowing pharmacy to be a part of this patient's care.  Hershal Coria 02/10/2016 2:31 PM

## 2016-02-10 NOTE — Progress Notes (Signed)
CSW continuing to follow.   Pt admitted from [facility] where pt had a private room, but pt family considering options of returning to [facility] vs. New SNF vs discharging home with home health services.  SNF bed offers provided to pt daughter, Sonal via telephone and left at bedside. Pt daughter states that pt family will discuss options. Pt daughter hopeful to speak with RNCM regarding care in the home in order for pt family to weigh option.   CSW to continue to follow to provide support and assist with pt disposition needs.   Alison Murray, MSW, LCSW Clinical Social Work [employer] [phone]

## 2016-02-10 NOTE — Progress Notes (Signed)
[facility] Progress Note  Juventina Dobry [id number] DOB: 1950/12/17 DOA: 02/07/2016  Referring physician: ED physician, Kathrynn Humble  PCP: [name], MD   Chief Complaint: blood counts low   HPI (From H&P by Dr. Doyle Askew):  Pt is 65 yo female with grade 3 serous endometrial ca, adjuvant chemo completed 06/16/15, now with rapid recurrent disease since mid January with large volume malignant ascites, bulky involvement pelvis and abdominal caking. She is followed by Dr. Marko Plume with oncology for ongoing chemo, last dose given 01/10/16. Recently admitted in February with shortness of breath, was intubated (extubated 01/14/2016) and required CVVHD, completed 10 days of vanc and zosyn, was also on stress dose steroids until 01/24/2016, was sent from the rehab with anemia and elevated WBC. Pt reports noticing weakness but denies any specific symptoms such as chest pain or shortness of breath, no fevers, chills, no specific abd or urinary concerns.   In ED, pt noted to be hemodynamically stable, VSS, blood work notable for WBC 23 K, Hg 6, Cr 1.96, TRH asked to admit for further evaluation. Off note, pt just had paracentesis done 02/06/2016 and had 2.8 L fluid removed but the fluid was not sent for analysis.   Subjective:  Stomach pain overnight improved with a Norco. Feeling a little better following blood transfusion.   Assessment and plan:  Principal Problem:  Sepsis due to Bilateral pneumonia - sepsis and pneumonia order set in place - unable to entirely exclude GI pathology - we have also requested GI stool panel and C. DIff given recent broad spectrum abx use - pt however denies any specific abd concerns and has not had diarrhea today so not sure if this could be etiology but will certainly keep on it and if any diarrhea, will need to rule out C. Diff  - Chest x-ray performed on 02/07/2016 revealing bibasilar opacities consistent with pneumonia. -She remains on broad-spectrum IV antibiotic  coverage with vancomycin and cefepime. -I am repeating a 2-view CXR today. Her am labs work showing elevated white count of 20.4. On exam she seems to be getting better, breathing comfortably RA, nontoxic, nonfocal. Await results from repeat CXR  Active Problems:  Anemia associated with chemotherapy / GI bleed -Labs on 02/07/2016 showed hemoglobin of 6.0, hemoglobin improved to 9.0 after receiving 2 units of packed red blood cells. -On 02/10/2016 her Hg remains stable at 9.2 <9.0 (02/08/2016)   AKI (acute kidney injury) ([place]) -Creatinine slowly trending down  1.67<1.76 <1.86< 1.96< 2.3 -Required CRRT on a recent hospitalization   Endometrial cancer ([place]) - She is following medical oncology   Malignant ascites - s/p paracentesis 3/7, 2.8 L fluid removed -Repeat chest x-ray in a.m.   Essential hypertension - reasonably stable on admission    Chemotherapy-induced peripheral neuropathy (HCC)   Non-insulin dependent type 2 diabetes mellitus ([place]) - continue home medical regimen  - also place on SSI   Protein-calorie malnutrition, severe ([place]) - Significant hypoalbuminemia with low protein stores noted.   Lovenox SQ for DVT prophylaxis   Radiological Exams on Admission: Dg Chest 2 View 02/07/2016 Bibasilar opacities concerning for pneumonia. Small effusions. No real change since prior study.   US Paracentesis 02/06/2016 ISuccessful ultrasound-guided therapeutic paracentesis yielding 2.8 liters of peritoneal fluid.   Code Status: Full Family Communication: I spoke to her daughter over telephone Disposition Plan: Repeating CXR today, white count remains elevated  [facility] [id number]   Review of Systems:  Constitutional: Negative for diaphoresis.  HENT: Negative for hearing loss, ear pain,  nosebleeds, congestion, sore throat, neck pain, tinnitus and ear discharge.  Eyes: Negative for blurred vision, double vision, photophobia, pain, discharge and redness.   Respiratory: Negative for cough, wheezing and stridor.  Cardiovascular: Negative for chest pain, palpitations, orthopnea, claudication and leg swelling.  Gastrointestinal: Per HPI  Genitourinary: Negative for dysuria, urgency, frequency, hematuria and flank pain.  Musculoskeletal: Negative for myalgias, back pain, joint pain and falls.  Skin: Negative for itching and rash.  Neurological: Negative for dizziness and weakness. Endo/Heme/Allergies: Negative for environmental allergies and polydipsia. Does not bruise/bleed easily.  Psychiatric/Behavioral: Negative for suicidal ideas. The patient is not nervous/anxious.    Past Medical History  Diagnosis Date  . Hypertension   . Glaucoma   . Diabetes mellitus without complication ([place])   . Palpitations   . Muscle cramps   . Postmenopausal vaginal bleeding   . Uterine cancer [facility])     Past Surgical History  Procedure Laterality Date  . Robotic assisted total hysterectomy with bilateral salpingo oopherectomy Bilateral 01/19/2015    Procedure: XI ROBOTIC ASSISTED TOTAL HYSTERECTOMY WITH BILATERAL SALPINGO OOPHORECTOMY; Surgeon: [name], MD; Location: [facility]; Service: Gynecology; Laterality: Bilateral;  . Cystoscopy w/ ureteral stent placement Bilateral 01/13/2016    Procedure: CYSTOSCOPY WITH RETROGRADE PYELOGRAM/BILATERAL URETERAL STENT PLACEMENT; Surgeon: [name], MD; Location: [facility]; Service: Urology; Laterality: Bilateral;    Social History:  reports that she has never smoked. She has never used smokeless tobacco. She reports that she does not drink alcohol or use illicit drugs.  No Known Allergies  Family History  Problem Relation Age of Onset  . Diabetes Father   . Arrhythmia Mother     Prior to Admission medications   Medication Sig Start Date End Date Taking? Authorizing Provider  acetaminophen (TYLENOL) 325 MG tablet Take 650 mg  by mouth every 6 (six) hours as needed for mild pain, moderate pain or headache. Reported on 01/08/2016   Yes Historical Provider, MD  albuterol (PROVENTIL) (2.5 MG/3ML) 0.083% nebulizer solution Take 2.5 mg by nebulization every 6 (six) hours as needed for wheezing or shortness of breath.   Yes Historical Provider, MD  cefTRIAXone (ROCEPHIN) 1 g injection Inject 1 g into the muscle daily.   Yes Historical Provider, MD  feeding supplement, GLUCERNA SHAKE, (GLUCERNA SHAKE) LIQD Take 237 mLs by mouth daily. Patient taking differently: Take 120 mLs by mouth 2 (two) times daily.  01/04/16  Yes Albertine Patricia, MD  ferrous fumarate (HEMOCYTE - 106 MG FE) 325 (106 FE) MG TABS tablet Take 1 tablet daily on an empty stomach with OJ or vitamin C tablet 09/11/15  Yes [name], MD  latanoprost (XALATAN) 0.005 % ophthalmic solution Place 1 drop into both eyes at bedtime.   Yes Historical Provider, MD  lidocaine-prilocaine (EMLA) cream Apply to Porta-Cath 1-2 hrs prior to access as directed. 01/02/16  Yes [name], MD  LORazepam (ATIVAN) 0.5 MG tablet Place 1 tablet under tongue or swallow every 8 hours as needed for nausea. Will make drowsy. 01/30/16  Yes [name], MD  midodrine (PROAMATINE) 10 MG tablet Take 1 tablet (10 mg total) by mouth 3 (three) times daily with meals. 01/30/16  Yes Christina P Rama, MD  moxifloxacin (AVELOX) 400 MG tablet Take 400 mg by mouth daily at 8 pm.   Yes Historical Provider, MD  Nutritional Supplements (RA NUTRITIONAL SUPPLEMENT PO) Take 1 Units by mouth 2 (two) times daily.   Yes Historical Provider, MD  nystatin (MYCOSTATIN) 100000 UNIT/ML  suspension Take 5 mLs (500,000 Units total) by mouth 4 (four) times daily. 01/30/16  Yes Christina P Rama, MD  ondansetron (ZOFRAN) 8 MG tablet Take 1 tablet by mouth every 8 hours as needed for nausea. Will not make drowsy. 01/02/16  Yes [name], MD  oxyCODONE (ROXICODONE) 5 MG immediate release tablet Take 1 tablet every 4-6 hours as needed for pain. 01/30/16  Yes Christina P Rama, MD  pantoprazole (PROTONIX) 40 MG tablet Take 1 tablet (40 mg total) by mouth daily. 11/20/15  Yes Everitt Amber, MD  pravastatin (PRAVACHOL) 20 MG tablet Take 20 mg by mouth daily.   Yes Historical Provider, MD  timolol (BETIMOL) 0.5 % ophthalmic solution Place 1 drop into both eyes 2 (two) times daily.   Yes Historical Provider, MD  glipiZIDE (GLUCOTROL XL) 5 MG 24 hr tablet Take 5 mg by mouth 3 (three) times daily. 12/12/15   Historical Provider, MD  insulin lispro (HUMALOG KWIKPEN) 100 UNIT/ML KiwkPen Inject into the skin. Inject per sliding scale: if 0-140 =0; 150-600=3, subcutaneously before meals    Historical Provider, MD  polyethylene glycol (MIRALAX / GLYCOLAX) packet Take 17 g by mouth daily as needed for mild constipation. Patient not taking: Reported on 02/07/2016 01/30/16   [name], MD    Physical Exam: Filed Vitals:   02/07/16 1152 02/07/16 1403  BP: 129/75   Pulse: 99   Temp: 98.4 F (36.9 C)   TempSrc: Oral   Resp: 16   Height:  5\' 1"  (1.549 m)  Weight:  66.679 kg (147 lb)  SpO2: 98%     Physical Exam  Constitutional: Appears well-developed and well-nourished. No distress.  HENT: Normocephalic. External right and left ear normal. Dry MM Eyes: Conjunctivae and EOM are normal. PERRLA, no scleral icterus.  Neck: Normal ROM. Neck supple. No JVD. No tracheal deviation. No thyromegaly.  CVS: RRR, no gallops, no carotid bruit.  Pulmonary: Effort and breath sounds normal, no stridor, rhonchi, wheezes, rales.  Abdominal: Soft. BS +, mild distension, tenderness, rebound or guarding.  Musculoskeletal: Normal range of motion.  Lymphadenopathy: No lymphadenopathy noted, cervical, inguinal. Neuro: Alert. Normal reflexes, muscle tone coordination. No cranial  nerve deficit. Skin: Skin is warm and dry. No rash noted. Not diaphoretic. No erythema. No pallor.  Psychiatric: Normal mood and affect. Behavior, judgment, thought content normal.   Labs on Admission:  Basic Metabolic Panel:  Last Labs      Recent Labs Lab 02/04/16 02/07/16 1224  NA 145 142  K 5.0 4.2  CL --  109  CO2 --  22  GLUCOSE --  270*  BUN 32* 28*  CREATININE 2.3* 1.96*  CALCIUM --  7.5*     CBC:  Last Labs      Recent Labs Lab 02/07/16 1224  WBC 23.4*  NEUTROABS 22.7*  HGB 6.0*  HCT 18.5*  MCV 89.4  PLT 254     EKG: pending

## 2016-02-10 NOTE — Progress Notes (Signed)
PHARMACIST - PHYSICIAN COMMUNICATION CONCERNING:  Vancomycin trough  Please see note written earlier today by Hershal Coria, PharmD, for full details.  Briefly, pt on Vanc and cefepime for PNA.   Vancomycin trough tonight = 19 mcg/ml (goal 15-20 mcg/ml).  Renal function improving.    RECOMMENDATION: Continue Vancomycin at 750mg  IV q24h.   Ralene Bathe, PharmD, BCPS 02/10/2016, 5:41 PM  Pager: [phone]

## 2016-02-11 LAB — URINE CULTURE

## 2016-02-11 LAB — GLUCOSE, CAPILLARY
GLUCOSE-CAPILLARY: 168 mg/dL — AB (ref 65–99)
GLUCOSE-CAPILLARY: 205 mg/dL — AB (ref 65–99)
Glucose-Capillary: 173 mg/dL — ABNORMAL HIGH (ref 65–99)
Glucose-Capillary: 143 mg/dL — ABNORMAL HIGH (ref 65–99)

## 2016-02-11 LAB — AMMONIA: AMMONIA: 23 umol/L (ref 9–35)

## 2016-02-11 MED ORDER — ISOSORBIDE MONONITRATE ER 30 MG PO TB24
15.0000 mg | ORAL_TABLET | Freq: Every day | ORAL | Status: DC
  Administered 2016-02-11: 15 mg via ORAL
  Filled 2016-02-11: qty 1

## 2016-02-11 MED ORDER — ALBUTEROL SULFATE (2.5 MG/3ML) 0.083% IN NEBU
2.5000 mg | INHALATION_SOLUTION | Freq: Three times a day (TID) | RESPIRATORY_TRACT | Status: DC
  Administered 2016-02-11 – 2016-02-15 (×10): 2.5 mg via RESPIRATORY_TRACT
  Filled 2016-02-11 (×10): qty 3

## 2016-02-11 MED ORDER — FUROSEMIDE 10 MG/ML IJ SOLN
40.0000 mg | Freq: Once | INTRAMUSCULAR | Status: AC
  Administered 2016-02-11: 40 mg via INTRAVENOUS
  Filled 2016-02-11: qty 4

## 2016-02-11 MED ORDER — MIDODRINE HCL 5 MG PO TABS
5.0000 mg | ORAL_TABLET | Freq: Three times a day (TID) | ORAL | Status: DC
  Administered 2016-02-12 – 2016-02-13 (×4): 5 mg via ORAL
  Filled 2016-02-11 (×8): qty 1

## 2016-02-11 NOTE — Progress Notes (Signed)
Progress Note  Urine cultures obtained on 02/07/2016 growing vancomycin-resistant enterococcus, cultures showing 10,000 colony-forming units. I discussed case with Dr Johnnye Sima of infectious disease who did not recommend treatment for this unless she was neutropenic.

## 2016-02-11 NOTE — Progress Notes (Signed)
[facility] Progress Note  Brittany Schwartz [id number] DOB: 15-Nov-1951 DOA: 02/07/2016  Referring physician: ED physician, Brittany Schwartz  PCP: [name], MD   Chief Complaint: blood counts low   Interim summary  Brittany Schwartz is an unfortunate 65 year old female with a past medical history of high-grade endometrial carcinoma, 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 She went on to receive 6 cycles of adjuvant chemotherapy with Paclitaxel and carboplatin from 02/16/2015 through 06/16/2015. Vaginal brachytherapy completed on 09/12/2015. Unfortunately she has had symptomatic recurrence with malignant ascites 6 months after completion of chemotherapy. A CT scan from September 2017 showed widespread omental tumor with worsening ascites and bladder invasion. She was recently hospitalized from 01/11/2016 through 01/30/2016 presented with sepsis, pneumonia, respiratory failure requiring intubation. That hospitalization was complicated by acute renal failure requiring CRRT. During that hospitalization she was seen by palliative care. He was discharged to SNF for rehabilitation as patient and family were hopeful for her to regain some of her functional status and continue treatment if possible.   She was readmitted to the medicine service on 02/07/2016 presenting with hemoglobin of 6.0 having white count of 23,400. Chest x-ray on admission showing bibasilar opacities consistent with pneumonia. Lactic acid elevated at 2.5. Having sepsis criteria she was started on broad-spectrum IV antibiotic therapy and placed on sepsis protocol. Additionally she was typed and crossed and transfused with packed red blood cells.  Repeat lab work showing stable hemoglobin. Renal function showing gradual improvement as well. She remained dyspneic, however, repeat chest x-ray on  02/10/2016 showed interval development of bilateral lower lobe opacities radiology for this could be combination of infection, edema, atelectasis and pleural effusions. She was given 40 mg of IV Lasix. Plan to repeat chest x-ray and labs in a.m. Antimicrobial therapy will need to be reassessed in am as she has received 4 days of broad spectrum IV antimicrobial therapy.   Subjective:  On today's exam she reports feeling more short of breath  Assessment and plan:  Principal Problem:  Sepsis due to Bilateral pneumonia -Brittany Schwartz having a history of metastatic cancer, recent prolonged hospitalization from 01/11/2016 through 01/30/2016 for respiratory failure, pneumonia, requiring intubation -Chest x-ray performed on 02/07/2016 revealing bibasilar opacities consistent with pneumonia. -She remains on broad-spectrum IV antibiotic coverage with vancomycin and cefepime. -A repeat chest x-ray performed on 02/10/2016 interpreted by radiology as having interval development of bilateral lower lung opacities possibly reflecting commendation of infection and edema with fullness of central vasculature. -On my assessment on 02/11/2016 she reported increasing shortness of breath. Plan to give her trial of IV Lasix 40 mg 1.   Active Problems:  Anemia associated with chemotherapy / GI bleed -Labs on 02/07/2016 showed hemoglobin of 6.0, hemoglobin improved to 9.0 after receiving 2 units of packed red blood cells. -On 02/10/2016 her Hg remains stable at 9.2 <9.0 (02/08/2016)   AKI (acute kidney injury) ([place]) -Creatinine slowly trending down  1.67<1.76 <1.86< 1.96< 2.3 -Required CRRT on a recent hospitalization -Given Lasix on 02/11/2016 given presence of pulmonary edema on chest x-ray and patient reporting worsening shortness of breath. -Repeat BMP in am   Endometrial cancer [facility]) - She is following medical oncology   Malignant ascites - s/p paracentesis 3/7, 2.8 L fluid removed -Repeat chest x-ray in  a.m.   Essential hypertension - reasonably stable on admission    Chemotherapy-induced peripheral neuropathy (HCC)   Non-insulin dependent type 2 diabetes mellitus (  [place]) - continue home medical regimen  - also place on SSI   Protein-calorie malnutrition, severe ([place]) - Significant hypoalbuminemia with low protein stores noted.   Lovenox SQ for DVT prophylaxis   Radiological Exams on Admission: Dg Chest 2 View 02/07/2016 Bibasilar opacities concerning for pneumonia. Small effusions. No real change since prior study.   US Paracentesis 02/06/2016 ISuccessful ultrasound-guided therapeutic paracentesis yielding 2.8 liters of peritoneal fluid.   Code Status: Full Family Communication: I spoke to her daughter over telephone Disposition Plan: Patient reporting worsening shortness of breath, will administer IV Lasix    Past Medical History  Diagnosis Date  . Hypertension   . Glaucoma   . Diabetes mellitus without complication ([place])   . Palpitations   . Muscle cramps   . Postmenopausal vaginal bleeding   . Uterine cancer [facility])     Past Surgical History  Procedure Laterality Date  . Robotic assisted total hysterectomy with bilateral salpingo oopherectomy Bilateral 01/19/2015    Procedure: XI ROBOTIC ASSISTED TOTAL HYSTERECTOMY WITH BILATERAL SALPINGO OOPHORECTOMY; Surgeon: [name], MD; Location: [facility]; Service: Gynecology; Laterality: Bilateral;  . Cystoscopy w/ ureteral stent placement Bilateral 01/13/2016    Procedure: CYSTOSCOPY WITH RETROGRADE PYELOGRAM/BILATERAL URETERAL STENT PLACEMENT; Surgeon: [name], MD; Location: [facility]; Service: Urology; Laterality: Bilateral;    Social History:  reports that she has never smoked. She has never used smokeless tobacco. She reports that she does not drink alcohol or use illicit drugs.  No Known Allergies  Family History  Problem Relation Age of Onset   . Diabetes Father   . Arrhythmia Mother     Prior to Admission medications   Medication Sig Start Date End Date Taking? Authorizing Provider  acetaminophen (TYLENOL) 325 MG tablet Take 650 mg by mouth every 6 (six) hours as needed for mild pain, moderate pain or headache. Reported on 01/08/2016   Yes Historical Provider, MD  albuterol (PROVENTIL) (2.5 MG/3ML) 0.083% nebulizer solution Take 2.5 mg by nebulization every 6 (six) hours as needed for wheezing or shortness of breath.   Yes Historical Provider, MD  cefTRIAXone (ROCEPHIN) 1 g injection Inject 1 g into the muscle daily.   Yes Historical Provider, MD  feeding supplement, GLUCERNA SHAKE, (GLUCERNA SHAKE) LIQD Take 237 mLs by mouth daily. Patient taking differently: Take 120 mLs by mouth 2 (two) times daily.  01/04/16  Yes Albertine Patricia, MD  ferrous fumarate (HEMOCYTE - 106 MG FE) 325 (106 FE) MG TABS tablet Take 1 tablet daily on an empty stomach with OJ or vitamin C tablet 09/11/15  Yes [name], MD  latanoprost (XALATAN) 0.005 % ophthalmic solution Place 1 drop into both eyes at bedtime.   Yes Historical Provider, MD  lidocaine-prilocaine (EMLA) cream Apply to Porta-Cath 1-2 hrs prior to access as directed. 01/02/16  Yes [name], MD  LORazepam (ATIVAN) 0.5 MG tablet Place 1 tablet under tongue or swallow every 8 hours as needed for nausea. Will make drowsy. 01/30/16  Yes [name], MD  midodrine (PROAMATINE) 10 MG tablet Take 1 tablet (10 mg total) by mouth 3 (three) times daily with meals. 01/30/16  Yes Christina P Rama, MD  moxifloxacin (AVELOX) 400 MG tablet Take 400 mg by mouth daily at 8 pm.   Yes Historical Provider, MD  Nutritional Supplements (RA NUTRITIONAL SUPPLEMENT PO) Take 1 Units by mouth 2 (two) times daily.   Yes Historical Provider, MD  nystatin (MYCOSTATIN) 100000 UNIT/ML suspension Take 5 mLs (500,000 Units total)  by mouth 4 (four) times daily. 01/30/16  Yes Christina P Rama, MD  ondansetron (ZOFRAN) 8 MG tablet Take 1 tablet by mouth every 8 hours as needed for nausea. Will not make drowsy. 01/02/16  Yes [name], MD  oxyCODONE (ROXICODONE) 5 MG immediate release tablet Take 1 tablet every 4-6 hours as needed for pain. 01/30/16  Yes Christina P Rama, MD  pantoprazole (PROTONIX) 40 MG tablet Take 1 tablet (40 mg total) by mouth daily. 11/20/15  Yes Everitt Amber, MD  pravastatin (PRAVACHOL) 20 MG tablet Take 20 mg by mouth daily.   Yes Historical Provider, MD  timolol (BETIMOL) 0.5 % ophthalmic solution Place 1 drop into both eyes 2 (two) times daily.   Yes Historical Provider, MD  glipiZIDE (GLUCOTROL XL) 5 MG 24 hr tablet Take 5 mg by mouth 3 (three) times daily. 12/12/15   Historical Provider, MD  insulin lispro (HUMALOG KWIKPEN) 100 UNIT/ML KiwkPen Inject into the skin. Inject per sliding scale: if 0-140 =0; 150-600=3, subcutaneously before meals    Historical Provider, MD  polyethylene glycol (MIRALAX / GLYCOLAX) packet Take 17 g by mouth daily as needed for mild constipation. Patient not taking: Reported on 02/07/2016 01/30/16   [name], MD    Physical Exam: Filed Vitals:   02/07/16 1152 02/07/16 1403  BP: 129/75   Pulse: 99   Temp: 98.4 F (36.9 C)   TempSrc: Oral   Resp: 16   Height:  5\' 1"  (1.549 m)  Weight:  66.679 kg (147 lb)  SpO2: 98%     Physical Exam  Constitutional: Ill-appearing, in mild distress HENT: Normocephalic. External right and left ear normal. Dry MM Eyes: Conjunctivae and EOM are normal. PERRLA, no scleral icterus.  Neck: Normal ROM. Neck supple. No JVD. No tracheal deviation. No thyromegaly.  CVS: RRR, no gallops, no carotid bruit. 2+ bilateral extremity pitting edema is present Pulmonary: She has extensive bilateral crackles, rhonchi, progressed since yesterday's examination. On  supplemental oxygen via nasal cannula. Abdominal: Soft. BS +, mild distension, tenderness, rebound or guarding.  Musculoskeletal: There is 2+ bilateral extremity pitting edema  Lymphadenopathy: No lymphadenopathy noted, cervical, inguinal. Neuro: Alert. Normal reflexes, muscle tone coordination. No cranial nerve deficit.  Labs on Admission:  Basic Metabolic Panel:  Last Labs      Recent Labs Lab 02/04/16 02/07/16 1224  NA 145 142  K 5.0 4.2  CL --  109  CO2 --  22  GLUCOSE --  270*  BUN 32* 28*  CREATININE 2.3* 1.96*  CALCIUM --  7.5*     CBC:  Last Labs      Recent Labs Lab 02/07/16 1224  WBC 23.4*  NEUTROABS 22.7*  HGB 6.0*  HCT 18.5*  MCV 89.4  PLT 254     EKG: pending

## 2016-02-11 NOTE — Progress Notes (Signed)
Follow-up note.  I reassessed Brittany Schwartz  this evening, appearing to be less interactive having a clinical decline. Earlier in the day I gave 40 mg of IV lasix  given my concern for pulmonary edema.  I think her encephalopathy is related to multiple factors including  Infection/sepsis, advanced  metastatic malignancy, hospitalization. I spoke to family memebers regarding her poor prognosis and lack of improvement despite broad spectrum antibiotic therapy. We discussed code status. They would like to speak to other family members first before making her a DNR. I offered palliative care consult which they declined. Family wishing to meet again tomorrow to discuss [name]'s medical goals of care.

## 2016-02-12 ENCOUNTER — Ambulatory Visit: Payer: BLUE CROSS/BLUE SHIELD | Admitting: Oncology

## 2016-02-12 ENCOUNTER — Inpatient Hospital Stay (HOSPITAL_COMMUNITY): Payer: BLUE CROSS/BLUE SHIELD

## 2016-02-12 ENCOUNTER — Telehealth: Payer: Self-pay | Admitting: *Deleted

## 2016-02-12 ENCOUNTER — Other Ambulatory Visit: Payer: BLUE CROSS/BLUE SHIELD

## 2016-02-12 LAB — COMPREHENSIVE METABOLIC PANEL
ALK PHOS: 125 U/L (ref 38–126)
ALT: 26 U/L (ref 14–54)
AST: 19 U/L (ref 15–41)
Albumin: 1.5 g/dL — ABNORMAL LOW (ref 3.5–5.0)
Anion gap: 8 (ref 5–15)
BUN: 17 mg/dL (ref 6–20)
CALCIUM: 7.8 mg/dL — AB (ref 8.9–10.3)
CO2: 30 mmol/L (ref 22–32)
CREATININE: 1.49 mg/dL — AB (ref 0.44–1.00)
Chloride: 104 mmol/L (ref 101–111)
GFR, EST AFRICAN AMERICAN: 41 mL/min — AB (ref >60)
GFR, EST NON AFRICAN AMERICAN: 36 mL/min — AB (ref >60)
Glucose, Bld: 159 mg/dL — ABNORMAL HIGH (ref 65–99)
Potassium: 2.8 mmol/L — ABNORMAL LOW (ref 3.5–5.1)
Sodium: 142 mmol/L (ref 135–145)
TOTAL PROTEIN: 5.3 g/dL — AB (ref 6.5–8.1)
Total Bilirubin: 0.5 mg/dL (ref 0.3–1.2)

## 2016-02-12 LAB — PROTEIN, BODY FLUID

## 2016-02-12 LAB — BODY FLUID CELL COUNT WITH DIFFERENTIAL
Eos, Fluid: 1 %
LYMPHS FL: 33 %
Monocyte-Macrophage-Serous Fluid: 26 % — ABNORMAL LOW (ref 50–90)
Neutrophil Count, Fluid: 40 % — ABNORMAL HIGH (ref 0–25)
Total Nucleated Cell Count, Fluid: 376 cu mm (ref 0–1000)

## 2016-02-12 LAB — CBC
HCT: 28.9 % — ABNORMAL LOW (ref 36.0–46.0)
HEMOGLOBIN: 9 g/dL — AB (ref 12.0–15.0)
MCH: 28.4 pg (ref 26.0–34.0)
MCHC: 31.1 g/dL (ref 30.0–36.0)
MCV: 91.2 fL (ref 78.0–100.0)
Platelets: 223 10*3/uL (ref 150–400)
RBC: 3.17 MIL/uL — AB (ref 3.87–5.11)
RDW: 16.8 % — ABNORMAL HIGH (ref 11.5–15.5)
WBC: 17.9 10*3/uL — ABNORMAL HIGH (ref 4.0–10.5)

## 2016-02-12 LAB — CULTURE, BLOOD (ROUTINE X 2): Culture: NO GROWTH

## 2016-02-12 LAB — GLUCOSE, CAPILLARY
GLUCOSE-CAPILLARY: 202 mg/dL — AB (ref 65–99)
GLUCOSE-CAPILLARY: 158 mg/dL — AB (ref 65–99)
Glucose-Capillary: 141 mg/dL — ABNORMAL HIGH (ref 65–99)
Glucose-Capillary: 166 mg/dL — ABNORMAL HIGH (ref 65–99)

## 2016-02-12 LAB — LACTATE DEHYDROGENASE, PLEURAL OR PERITONEAL FLUID: LD, Fluid: 135 U/L — ABNORMAL HIGH (ref 3–23)

## 2016-02-12 LAB — EXPECTORATED SPUTUM ASSESSMENT W REFEX TO RESP CULTURE

## 2016-02-12 LAB — EXPECTORATED SPUTUM ASSESSMENT W GRAM STAIN, RFLX TO RESP C

## 2016-02-12 LAB — GLUCOSE, SEROUS FLUID: GLUCOSE FL: 188 mg/dL

## 2016-02-12 NOTE — Telephone Encounter (Signed)
Patient's daughter [name] called reporting "mom is on the hospital and will not be in for today's appointments".  Comments added for appointments to be cancelled by today's scheduled departments.

## 2016-02-12 NOTE — Progress Notes (Signed)
PT Cancellation Note  Patient Details Name: Brittany Schwartz MRN: [id number] DOB: [birth year]   Cancelled Treatment:     out of room for Ultrsound Thoracentesis    Nathanial Rancher 02/12/2016, 1:41 PM

## 2016-02-12 NOTE — Progress Notes (Signed)
Patient ID: Brittany Schwartz, female   DOB: [birth year], 65 y.o.   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 She went on to receive 6 cycles of adjuvant chemotherapy with Paclitaxel and carboplatin from 02/16/2015 through 06/16/2015. Vaginal brachytherapy completed on 09/12/2015. Unfortunately she has had symptomatic recurrence with malignant ascites 6 months after completion of chemotherapy. A CT scan from September 2017 showed widespread omental tumor with worsening ascites and bladder invasion. She was recently hospitalized from 01/11/2016 through 01/30/2016 presented with sepsis, pneumonia, respiratory failure requiring intubation. That hospitalization was complicated by acute renal failure requiring CRRT. During that hospitalization she was seen by palliative care. She was discharged to SNF for rehabilitation. She was readmitted to the medicine service on 02/07/2016 presenting with hemoglobin of 6.0 having white count of 23,400. Chest x-ray on admission showing bibasilar opacities consistent with pneumonia. Lactic acid elevated at 2.5. Having sepsis criteria she was started on broad-spectrum IV antibiotic therapy and placed on sepsis protocol. Additionally she was typed and crossed and transfused with packed red blood cells. Repeat lab work showing stable hemoglobin. Renal function showing gradual improvement as well. She remained dyspneic, however, repeat chest x-ray on 02/10/2016 showed interval development of bilateral lower lobe opacities radiology for this could be combination of  infection, edema, atelectasis and pleural effusions. She was given 40 mg of IV Lasix. Plan to repeat chest x-ray and labs in a.m. Antimicrobial therapy will need to be reassessed in am as she has received 4 days of broad spectrum IV antimicrobial therapy. "  Assessment/Plan:    Principal Problem: Sepsis due to bilateral pneumonia / postobstructive pneumonia - Repeat chest x-ray this morning shows no significant changes in bilateral pleural effusions and bibasilar atelectasis or consolidation. She could possibly have postobstructive pneumonia in the setting of bilateral pleural effusions - Order placed for ultrasound guided thoracentesis and fluid analysis especially cytology to see whether this is separated infectious etiology versus because of malignancy - Continue current antibiotics with vancomycin and cefepime - She did get one dose of Lasix 40 mg IV bolus 02/11/2016. Patient does report feeling little better this morning. - Respiratory status is stable.  Active Problems: Antineoplastic induced anemia  - Patient has received 2 units of PRBC transfusion during this hospital stay - Hemoglobin is stable at 9 - If she does require further transfusion she will have to receive Lasix provided blood pressure is stable and kidney function is stable to prevent fluid overload - Continue iron supplementation  AKI (acute kidney injury) ([place]) - Required CRRT on a recent hospitalization - Creatinine was 2.3 on the admission and is trending down, 1.49 this morning - She was given Lasix 02/11/2016 for pleural effusions - Follow-up BMP tomorrow morning  Endometrial cancer ([place]) - Management per oncology. Patient declined palliative care involvement at this time  Malignant ascites - S/p paracentesis 3/7, 2.8 L fluid removed  Essential hypertension - Reasonable inpatient control  Non-insulin dependent type 2 diabetes mellitus without complications without long-term insulin use ([place]) - She is on  sliding scale insulin during this admission  Protein-calorie malnutrition, severe ([place]) - In the setting of chronic illness - Liberalize diet as tolerated   DVT  Prophylaxis  - Lovenox subcutaneous ordered   Code Status: Full.  Family Communication:  plan of care discussed with the patient and her daughter. I spoke with her daughter over the phone today. Disposition Plan: Home likely by 02/14/2016 if respiratory status is stable and patient feels better  IV access:  Peripheral IV  Procedures and diagnostic studies:    Dg Chest 2 View 02/12/2016  No significant change in bilateral pleural effusions and bibasilar atelectasis or consolidation. Electronically Signed   By: Earle Gell M.D.   On: 02/12/2016 08:41   Dg Chest 2 View 02/10/2016  Interval development of bilateral lower lung opacities, potentially combination of infection, edema, atelectasis, and bilateral pleural effusions. Signed, Dulcy Fanny. Earleen Newport, DO [facility] Electronically Signed   By: Corrie Mckusick D.O.   On: 02/10/2016 10:37   Dg Chest 2 View 02/07/2016  Bibasilar opacities concerning for pneumonia. Small effusions. No real change since prior study. Electronically Signed   By: Rolm Baptise M.D.   On: 02/07/2016 14:24   US Paracentesis 02/06/2016 Successful ultrasound-guided therapeutic paracentesis yielding 2.8 liters of peritoneal fluid. Read by: Rowe Robert, PA-C Electronically Signed   By: Sandi Mariscal M.D.   On: 02/06/2016 12:21   IAnti-Infectives:   Cefepime and vancomycin   Leisa Lenz, MD  [facility] Pager [phone]  Time spent in minutes: 25 minutes  If 7PM-7AM, please contact night-coverage [email or web address] Password [facility] 02/12/2016, 12:41 PM   LOS: 5 days    HPI/Subjective: No acute overnight events. Patient reports she feels better this morning.  Objective: Filed Vitals:   02/11/16 2005 02/11/16 2100 02/12/16 0647 02/12/16 0924  BP:   144/75 155/74   Pulse:  93 74   Temp:  97.7 F (36.5 C) 97.4 F (36.3 C)   TempSrc:  Oral Oral   Resp:  20 22   Height:      Weight:      SpO2: 98% 100% 100% 97%    Intake/Output Summary (Last 24 hours) at 02/12/16 1241 Last data filed at 02/12/16 0900  Gross per 24 hour  Intake    440 ml  Output    825 ml  Net   -385 ml    Exam:   General:  Pt is alert, follows commands appropriately, not in acute distress  Cardiovascular: Regular rate and rhythm, S1/S2 appreciated   Respiratory: diminished bilaterally, no wheezing, no crackles, no rhonchi  Abdomen: Soft, non tender, non distended, bowel sounds present  Extremities: No edema, pulses palpable bilaterally  Neuro: Grossly nonfocal  Data Reviewed: Basic Metabolic Panel:  Recent Labs Lab 02/07/16 1224 02/07/16 2143 02/08/16 0820 02/09/16 0520 02/10/16 0450 02/12/16 0348  NA 142  --  142 145 141 142  K 4.2  --  3.8 3.9 3.5 2.8*  CL 109  --  108 110 108 104  CO2 22  --  23 23 22 30   GLUCOSE 270*  --  222* 144* 186* 159*  BUN 28*  --  23* 20 17 17   CREATININE 1.96*  --  1.86* 1.76* 1.67* 1.49*  CALCIUM 7.5*  --  7.5* 7.9* 7.9* 7.8*  MG  --  1.2*  --   --   --   --    Liver Function Tests:  Recent Labs Lab 02/07/16 1344 02/12/16 0348  AST 32 19  ALT 36 26  ALKPHOS 117 125  BILITOT 0.7 0.5  PROT 5.1* 5.3*  ALBUMIN 1.4* 1.5*  No results for input(s): LIPASE, AMYLASE in the last 168 hours.  Recent Labs Lab 02/11/16 1700  AMMONIA 23   CBC:  Recent Labs Lab 02/07/16 1224 02/08/16 0820 02/10/16 0450 02/12/16 0348  WBC 23.4* 19.7* 20.4* 17.9*  NEUTROABS 22.7*  --   --   --   HGB 6.0* 9.0* 9.2* 9.0*  HCT 18.5* 27.7* 28.0* 28.9*  MCV 89.4 86.8 88.1 91.2  PLT 254 214 228 223   Cardiac Enzymes: No results for input(s): CKTOTAL, CKMB, CKMBINDEX, TROPONINI in the last 168 hours. BNP: Invalid input(s): POCBNP CBG:  Recent Labs Lab 02/11/16 1202 02/11/16 1718 02/11/16 2203 02/12/16 0741  02/12/16 1152  GLUCAP 205* 173* 143* 141* 166*    Recent Results (from the past 240 hour(s))  Blood Culture (routine x 2)     Status: None   Collection Time: 02/07/16  1:44 PM  Result Value Ref Range Status   Specimen Description BLOOD RIGHT ARM  Final   Special Requests BOTTLES DRAWN AEROBIC AND ANAEROBIC 5CC  Final   Culture   Final    NO GROWTH 5 DAYS Performed at [facility]    Report Status 02/12/2016 FINAL  Final  Urine culture     Status: None (Preliminary result)   Collection Time: 02/07/16  1:54 PM  Result Value Ref Range Status   Specimen Description URINE, CLEAN CATCH  Final   Special Requests NONE  Final   Culture   Final    50,000 COLONIES/mL ENTEROCOCCUS SPECIES DIFFICULTY OBTAINING DEFINITIVE SENSITIVITIES Performed at [facility]    Report Status PENDING  Incomplete  MRSA PCR Screening     Status: None   Collection Time: 02/07/16  9:14 PM  Result Value Ref Range Status   MRSA by PCR NEGATIVE NEGATIVE Final    Comment:        The GeneXpert MRSA Assay (FDA approved for NASAL specimens only), is one component of a comprehensive MRSA colonization surveillance program. It is not intended to diagnose MRSA infection nor to guide or monitor treatment for MRSA infections.   Blood Culture (routine x 2)     Status: None (Preliminary result)   Collection Time: 02/07/16  9:43 PM  Result Value Ref Range Status   Specimen Description BLOOD RIGHT ANTECUBITAL  Final   Special Requests IN PEDIATRIC BOTTLE 3ML  Final   Culture   Final    NO GROWTH 4 DAYS Performed at [facility]    Report Status PENDING  Incomplete  Urine culture     Status: None   Collection Time: 02/07/16 10:40 PM  Result Value Ref Range Status   Specimen Description URINE, RANDOM  Final   Special Requests NONE  Final   Culture   Final    10,000 COLONIES/mL VANCOMYCIN RESISTANT ENTEROCOCCUS Performed at [facility]    Report Status 02/11/2016 FINAL  Final    Organism ID, Bacteria VANCOMYCIN RESISTANT ENTEROCOCCUS  Final      Susceptibility   Vancomycin resistant enterococcus - MIC*    AMPICILLIN >=32 RESISTANT Resistant     LEVOFLOXACIN >=8 RESISTANT Resistant     NITROFURANTOIN 256 RESISTANT Resistant     VANCOMYCIN >=32 RESISTANT Resistant     LINEZOLID 2 SENSITIVE Sensitive     * 10,000 COLONIES/mL VANCOMYCIN RESISTANT ENTEROCOCCUS  Gastrointestinal Panel by PCR , Stool     Status: None   Collection Time: 02/08/16 12:19 PM  Result Value Ref Range Status   Campylobacter species NOT DETECTED  NOT DETECTED Final   Plesimonas shigelloides NOT DETECTED NOT DETECTED Final   Salmonella species NOT DETECTED NOT DETECTED Final   Yersinia enterocolitica NOT DETECTED NOT DETECTED Final   Vibrio species NOT DETECTED NOT DETECTED Final   Vibrio cholerae NOT DETECTED NOT DETECTED Final   Enteroaggregative E coli (EAEC) NOT DETECTED NOT DETECTED Final   Enteropathogenic E coli (EPEC) NOT DETECTED NOT DETECTED Final   Enterotoxigenic E coli (ETEC) NOT DETECTED NOT DETECTED Final   Shiga like toxin producing E coli (STEC) NOT DETECTED NOT DETECTED Final   E. coli O157 NOT DETECTED NOT DETECTED Final   Shigella/Enteroinvasive E coli (EIEC) NOT DETECTED NOT DETECTED Final   Cryptosporidium NOT DETECTED NOT DETECTED Final   Cyclospora cayetanensis NOT DETECTED NOT DETECTED Final   Entamoeba histolytica NOT DETECTED NOT DETECTED Final   Giardia lamblia NOT DETECTED NOT DETECTED Final   Adenovirus F40/41 NOT DETECTED NOT DETECTED Final   Astrovirus NOT DETECTED NOT DETECTED Final   Norovirus GI/GII NOT DETECTED NOT DETECTED Final   Rotavirus A NOT DETECTED NOT DETECTED Final   Sapovirus (I, II, IV, and V) NOT DETECTED NOT DETECTED Final  C difficile quick scan w PCR reflex     Status: None   Collection Time: 02/08/16 12:19 PM  Result Value Ref Range Status   C Diff antigen NEGATIVE NEGATIVE Final   C Diff toxin NEGATIVE NEGATIVE Final   C Diff  interpretation Negative for toxigenic C. difficile  Final  Culture, sputum-assessment     Status: None   Collection Time: 02/11/16 11:47 PM  Result Value Ref Range Status   Specimen Description SPUTUM  Final   Special Requests NONE  Final   Sputum evaluation   Final    THIS SPECIMEN IS ACCEPTABLE. RESPIRATORY CULTURE REPORT TO FOLLOW.   Report Status 02/12/2016 FINAL  Final     Scheduled Meds: . albuterol  2.5 mg Nebulization TID  . ceFEPime (MAXIPIME)  1 g Intravenous Q24H  . enoxaparin (LOVENOX) injection  30 mg Subcutaneous Q24H  . Ferrous Fumarate  1 tablet Oral BID  . insulin aspart  0-9 Units Subcutaneous TID WC  . midodrine  5 mg Oral TID WC  . nystatin  5 mL Oral QID  . pravastatin  20 mg Oral Daily  . vancomycin  750 mg Intravenous Q24H

## 2016-02-12 NOTE — Progress Notes (Signed)
Spoke with pt and daughter at bedside for a long length of time concerning home with [facility].  Explain SNF vs HH, DME and private duty sitters.  Daughter states that she will talk to her father.  Pt states, "I want to go home."

## 2016-02-12 NOTE — Procedures (Signed)
Ultrasound-guided diagnostic and therapeutic right  thoracentesis performed yielding 700 cc of slightly hazy, yellow  fluid. No immediate complications. Follow-up chest x-ray pending. The fluid was sent to the lab for preordered studies.

## 2016-02-13 ENCOUNTER — Other Ambulatory Visit: Payer: BLUE CROSS/BLUE SHIELD

## 2016-02-13 ENCOUNTER — Telehealth: Payer: Self-pay | Admitting: Oncology

## 2016-02-13 ENCOUNTER — Other Ambulatory Visit: Payer: Self-pay | Admitting: Oncology

## 2016-02-13 ENCOUNTER — Encounter: Payer: BLUE CROSS/BLUE SHIELD | Admitting: Genetic Counselor

## 2016-02-13 DIAGNOSIS — E119 Type 2 diabetes mellitus without complications: Secondary | ICD-10-CM

## 2016-02-13 DIAGNOSIS — E46 Unspecified protein-calorie malnutrition: Secondary | ICD-10-CM

## 2016-02-13 DIAGNOSIS — N289 Disorder of kidney and ureter, unspecified: Secondary | ICD-10-CM

## 2016-02-13 DIAGNOSIS — J90 Pleural effusion, not elsewhere classified: Secondary | ICD-10-CM

## 2016-02-13 DIAGNOSIS — N179 Acute kidney failure, unspecified: Secondary | ICD-10-CM

## 2016-02-13 DIAGNOSIS — D6481 Anemia due to antineoplastic chemotherapy: Secondary | ICD-10-CM

## 2016-02-13 DIAGNOSIS — C541 Malignant neoplasm of endometrium: Secondary | ICD-10-CM

## 2016-02-13 DIAGNOSIS — R180 Malignant ascites: Secondary | ICD-10-CM

## 2016-02-13 DIAGNOSIS — J189 Pneumonia, unspecified organism: Secondary | ICD-10-CM

## 2016-02-13 DIAGNOSIS — D649 Anemia, unspecified: Secondary | ICD-10-CM

## 2016-02-13 LAB — GLUCOSE, CAPILLARY
GLUCOSE-CAPILLARY: 149 mg/dL — AB (ref 65–99)
GLUCOSE-CAPILLARY: 268 mg/dL — AB (ref 65–99)
Glucose-Capillary: 169 mg/dL — ABNORMAL HIGH (ref 65–99)
Glucose-Capillary: 143 mg/dL — ABNORMAL HIGH (ref 65–99)

## 2016-02-13 LAB — CBC
HEMATOCRIT: 31.7 % — AB (ref 36.0–46.0)
HEMOGLOBIN: 9.8 g/dL — AB (ref 12.0–15.0)
MCH: 28.2 pg (ref 26.0–34.0)
MCHC: 30.9 g/dL (ref 30.0–36.0)
MCV: 91.1 fL (ref 78.0–100.0)
Platelets: 204 10*3/uL (ref 150–400)
RBC: 3.48 MIL/uL — ABNORMAL LOW (ref 3.87–5.11)
RDW: 16.5 % — ABNORMAL HIGH (ref 11.5–15.5)
WBC: 19.2 10*3/uL — AB (ref 4.0–10.5)

## 2016-02-13 LAB — BASIC METABOLIC PANEL
ANION GAP: 7 (ref 5–15)
BUN: 15 mg/dL (ref 6–20)
CHLORIDE: 102 mmol/L (ref 101–111)
CO2: 30 mmol/L (ref 22–32)
Calcium: 7.6 mg/dL — ABNORMAL LOW (ref 8.9–10.3)
Creatinine, Ser: 1.42 mg/dL — ABNORMAL HIGH (ref 0.44–1.00)
GFR calc Af Amer: 44 mL/min — ABNORMAL LOW (ref >60)
GFR, EST NON AFRICAN AMERICAN: 38 mL/min — AB (ref >60)
GLUCOSE: 204 mg/dL — AB (ref 65–99)
POTASSIUM: 2.8 mmol/L — AB (ref 3.5–5.1)
SODIUM: 139 mmol/L (ref 135–145)

## 2016-02-13 LAB — URINE CULTURE: Culture: 50000

## 2016-02-13 LAB — PH, BODY FLUID: PH, BODY FLUID: 7.9

## 2016-02-13 LAB — GRAM STAIN

## 2016-02-13 LAB — MAGNESIUM: Magnesium: 1 mg/dL — ABNORMAL LOW (ref 1.7–2.4)

## 2016-02-13 LAB — CULTURE, BLOOD (ROUTINE X 2): CULTURE: NO GROWTH

## 2016-02-13 MED ORDER — GLUCERNA SHAKE PO LIQD
237.0000 mL | Freq: Three times a day (TID) | ORAL | Status: DC
  Administered 2016-02-13 – 2016-02-15 (×7): 237 mL via ORAL
  Filled 2016-02-13 (×8): qty 237

## 2016-02-13 MED ORDER — MIDODRINE HCL 5 MG PO TABS: 5.0000 mg | ORAL_TABLET | Freq: Two times a day (BID) | ORAL | Status: DC

## 2016-02-13 MED ORDER — MAGNESIUM SULFATE 2 GM/50ML IV SOLN
2.0000 g | Freq: Once | INTRAVENOUS | Status: AC
  Administered 2016-02-13: 2 g via INTRAVENOUS
  Filled 2016-02-13: qty 50

## 2016-02-13 MED ORDER — ENOXAPARIN SODIUM 40 MG/0.4ML ~~LOC~~ SOLN
40.0000 mg | SUBCUTANEOUS | Status: DC
  Administered 2016-02-13 – 2016-02-14 (×2): 40 mg via SUBCUTANEOUS
  Filled 2016-02-13 (×2): qty 0.4

## 2016-02-13 MED ORDER — POTASSIUM CHLORIDE CRYS ER 20 MEQ PO TBCR
40.0000 meq | EXTENDED_RELEASE_TABLET | Freq: Once | ORAL | Status: AC
  Administered 2016-02-13: 40 meq via ORAL
  Filled 2016-02-13: qty 2

## 2016-02-13 NOTE — Progress Notes (Signed)
MEDICAL ONCOLOGY February 13, 2016, 11:03 AM  Hospital day 7 Antibiotics: maxipime, vanc Chemotherapy: single cycle adriamycin 01-10-16 for recurrent endometrial carcinoma. 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   Missed appointment at [facility] on 02-12-16.  EMR reviewed, patient seen with cousin from [place] present, and I have spoken with daughter Sonal by phone at bedside. Daughter told me events at SNF with symptomatic malignant ascites, progressive anemia and pneumonia.  Patient was admitted 02-07-16 with hemoglobin 6.0, transfused 2 units PRBCs on 02-08-16, is on antibiotics for apparent pneumonia and had [place] right thoracentesis 02-12-16 for 700 cc, cytology pending. Creatinine much better than at last DC, 1.4 today.  Appears that the US paracentesis done PTA was from an old order in EMR, as I was not aware that this was being done at the time. Suggest if repeat paracentesis needed should have peritoneal drain placed so that this can be used for small volume paracenteses out of hospital in future.   Subjective: Feeling a little better today, but very weak, still coughing, upper abdominal fullness when eats. Up in chair on 3-12 and wants to be up again today. Reports drinking one glucerna daily tho I do not find this with diet now, have ordered and encouraged her to drink 3 daily. States bowels are moving. Has been able to sleep.    ONCOLOGIC HISTORY Paient presented to Dr [name] 01-03-15 with vaginal spotting x 1 year. Pelvic US showed 33 mm endometrial stripe with ovaries normal, and endometrial biopsy 01-03-15 grade 3 endometrial adenocarcinoma. CT CAP 01-13-15 had negative chest, no adenopathy, mild fatty liver, markedly thickened endometrium and no evidence of metastatic disease outside of uterus. CA 125 preoperatively on 01-13-15 was 22.9. Surgery by Dr [name] 01-19-15 was robotic hysterectomy, BSO and resection of nodule at sigmoid serosa. At  surgery there was milial tumor studding with 5-10 mm implants on bladder peritoneum and miliary disease on pelvic sidewalls, without upper abdominal disease evident, and omentum appeared normal. 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 Recommendation for 6 cycles of taxol carboplatin, then consideration of external beam RT + vaginal brachytherapy if CR. First carboplatin taxol was given 02-16-2015; she was neutropenic with ANC 0.7 by day 15 cycle 1. She was transfused 2 units PRBCs 5-19/5-20-16 for Hgb 7.8. Cycles 4 and 5 were both delayed due to thrombocytopenia. She completed chemotherapy 06-16-15 and vaginal brachytherapy30 gray in 5 fractions as of 09/14/2015. She did well thru end of 11-2015, including at gyn oncology exam 11-20-15 with CA 125 of 13 then. She presented to ED 12-29-15 with ~ 2 weeks progressive abdominal distension and pain, with large volume malignant ascites and large recurrent tumor burden. Cytology [id number] from ascites malignant cells consistent with gyn adenocarcinoma; CA 125 was up to 326 on 12-29-15. She was hospitalized thru 01-07-16, with paracentesis for 4 liters bloody fluid on 1-28, for 3.5 liters on 01-02-16 and 4 liters on 01-09-16. Chemo was held 01-08-16 due to concerning exam, with bilateral LE venous dopplers no evidence of DVT and CT confirming large ascites (not appreciated on [place] 01-06-16) and bulky tumor without apparent bleed. Patient and family were counseled on very aggressive and advanced recurrent malignancy, understood that any treatment would be in attempt to control disease but not potential cure. Discussion then about life support and advance directives, specifically that such measures would not  improve the underlying malignancy; patient and family wanted to consider that information. She requested attempt at treatment, had  first adriamycin on 01-10-16 She was receiving PRBCs on 01-11-16 when developed respiratory distress, intubated,  supported in ICU with continuous dialysis and pressors. She was DC to SNF on 01-30-16. She had outpatient paracentesis for 2.8 liters on 02-06-16, then admitted on 02-07-16 with pneumonia and anemia.   Objective: Vital signs in last 24 hours: Blood pressure 179/77, pulse 88, temperature 98 F (36.7 C), temperature source Oral, resp. rate 20, height 5\' 1"  (1.549 m), weight 136 lb 7.4 oz (61.9 kg), SpO2 97 %. Looks chronically ill, moreso than when I had seen her during last admission, lying supine in bed with [place] O2. Closes eyes when not in conversation, otherwise awake with appropriate conversation and easily cooperative. Respirations not labored still in bed, occasional cough. Oral mucosa moist. PERRL, not icteric. PAC site ok. Lungs clear anteriorly. Heart RRR. Abdomen moderately distended, not tight, fluid wave. Not tender, quiet. LE no cords or tenderness, feet warm. Moves all extremities.   Intake/Output from previous day: 03/13 0701 - 03/14 0700 In: 510 [P.O.:360; IV Piggyback:150] Out: 1150 [Urine:1150] Intake/Output this shift: Total I/O In: 240 [P.O.:240] Out: 300 [Urine:300]    Lab Results:  Recent Labs  02/12/16 0348 02/13/16 0945  WBC 17.9* 19.2*  HGB 9.0* 9.8*  HCT 28.9* 31.7*  PLT 223 204   BMET  Recent Labs  02/12/16 0348 02/13/16 0945  NA 142 139  K 2.8* 2.8*  CL 104 102  CO2 30 30  GLUCOSE 159* 204*  BUN 17 15  CREATININE 1.49* 1.42*  CALCIUM 7.8* 7.6*   Improvement in renal function noted  Studies/Results: Dg Chest 1 View  02/12/2016  CLINICAL DATA:  Post right thoracentesis. EXAM: CHEST 1 VIEW COMPARISON:  02/12/2016 at 7:56 a.m. FINDINGS: Right IJ Port-A-Cath unchanged. Lungs are hypoinflated with interval improvement in right-sided effusion with mild residual right base opacification likely combination of minimal pleural fluid with  atelectasis. No evidence of right-sided pneumothorax. Mild stable left base opacification likely layering small left effusion with atelectasis. Mild stable cardiomegaly. Remainder of the exam is unchanged. IMPRESSION: Post right thoracentesis with improved right effusion. Mild residual opacification in the right base likely small amount of pleural fluid with atelectasis. No pneumothorax. Stable left base opacification likely small effusion with atelectasis. Electronically Signed   By: Marin Olp M.D.   On: 02/12/2016 14:36   Dg Chest 2 View  02/12/2016  CLINICAL DATA:  Shortness breath. Pneumonia. Endometrial carcinoma. EXAM: CHEST  2 VIEW COMPARISON:  02/10/2016 FINDINGS: Moderate bilateral pleural effusions are again seen with associated bibasilar atelectasis or consolidation. This shows no significant interval change. Power port remains in place. Heart size is stable. No pneumothorax visualized. IMPRESSION: No significant change in bilateral pleural effusions and bibasilar atelectasis or consolidation. Electronically Signed   By: Earle Gell M.D.   On: 02/12/2016 08:41   US Thoracentesis Asp Pleural Space W/img Guide  02/12/2016  INDICATION: Endometrial cancer, pneumonia, dyspnea, bilateral pleural effusions; request is made for diagnostic and therapeutic right thoracentesis. EXAM: ULTRASOUND GUIDED DIAGNOSTIC AND THERAPEUTIC RIGHT THORACENTESIS MEDICATIONS: None. COMPLICATIONS: None immediate. PROCEDURE: An ultrasound guided thoracentesis was thoroughly discussed with the patient and questions answered. The benefits, risks, alternatives and complications were also discussed. The patient understands and wishes to proceed with the procedure. Written consent was obtained. Ultrasound was performed to localize and mark an adequate pocket of fluid in the right chest. The  area was then prepped and draped in the normal sterile fashion. 1% Lidocaine was used for local anesthesia. Under ultrasound guidance a  Safe-T-Centesis catheter was introduced. Thoracentesis was performed. The catheter was removed and a dressing applied. FINDINGS: A total of approximately 700 cc of slightly hazy, yellow fluid was removed. Samples were sent to the laboratory as requested by the clinical team. IMPRESSION: Successful ultrasound guided diagnostic and therapeutic right thoracentesis yielding 700 cc of pleural fluid. Read by: Rowe Robert, PA-C Electronically Signed   By: Jerilynn Mages.  Shick M.D.   On: 02/12/2016 14:44          Assessment/Plan: 1.recurrent high grade endometrial carcinoma presenting 12-2015 with large volume malignant ascites, with complicated course since then including acute respiratory decompensation and ARF in 01-2016 requiring ventilator support and CRRT initially. Situation not appropriate so far for additional chemo beyond first cycle adriamycin given 01-10-16, that in attempt to control disease.  Will follow up cytology of right pleural fluid, very likely to be malignant also. Ascites is malignant from previous cytology evaluations.   If another paracentesis necessary for symptoms, would try to get peritoneal drain placed, to allow management with small volume paracenteses out of hospital. Note large volume paracenteses in Feb contributed to ARF then. Patient and husband have wanted to pursue treatment rather than comfort care only; daughters have been more open to comfort care. Code status last admission was limited code, no re-intubation.  I will reschedule outpatient follow up at [facility]. 2.pneumonia on antibiotics. Large right pleural effusion tapped 02-12-16, cytology pending. 3.anemia: multifactorial including bloody ascites previously, blood draws, chemo, renal insufficiency. Note she is lifelong vegan, had been on B12 supplements at least prior to all of this acute illness. 4.Acute renal failure 01-2016: creatinine has improved further since last admission. Known to Dr Jonnie Finner. 5. Diabetes followed by PCP  previously 6.protein calorie malnutrition: poor po intake x >2 months with problems above, significant protein losses with malignant ascites. Added glucerna, which she has liked in past   Please let me know if I can help otherwise. Pager [phone]  Teodor Prater P

## 2016-02-13 NOTE — Progress Notes (Addendum)
Pharmacy Antibiotic Note  Brittany Schwartz is a 65 y.o. female admitted on 02/07/2016 with pneumonia.  Pharmacy has been consulted for Vancomycin dosing as well as antibiotic renal dose adjustments.  Pt has a history of uterine cancer s/p resection, currently receiving chemo.  Cycle 1 completed 2/8.  Admitted from SNF for low Hgb <7.  Was started on Ceftriaxone 3/7 at facility for PNA.    SCr elevated on admission, now improved with CrCl ~ 28 ml/min.  WBC remaining elevated, CXR with b/l lower lung opacities, urine growing unimpressive amt of Enterococcus.  Plan: Today is day 7 antibiotics 1) Continue vancomycin 750mg  IV q24 2) Continue Cefepime dose to 1g IV q24h. 3) Note abx's entered to end after tomorrow's doses which will be 8 days 4) Will recheck CBC AM 3/15  Height: 5\' 1"  (154.9 cm) Weight: 136 lb 7.4 oz (61.9 kg) IBW/kg (Calculated) : 47.8  Temp (24hrs), Avg:98 F (36.7 C), Min:97.7 F (36.5 C), Max:98.2 F (36.8 C)   Recent Labs Lab 02/07/16 1224 02/07/16 1417 02/07/16 2305 02/08/16 0820 02/08/16 1045 02/09/16 0520 02/10/16 0450 02/10/16 1545 02/12/16 0348  WBC 23.4*  --   --  19.7*  --   --  20.4*  --  17.9*  CREATININE 1.96*  --   --  1.86*  --  1.76* 1.67*  --  1.49*  LATICACIDVEN  --  2.48* 2.5* 1.4 2.0  --   --   --   --   VANCOTROUGH  --   --   --   --   --   --   --  19  --     Estimated Creatinine Clearance: 31.7 mL/min (by C-G formula based on Cr of 1.49).    No Known Allergies  Antimicrobials this admission: 3/8 Zosyn x 1 3/8 Vancomycin >>  3/8 Cefepime >>  Dose adjustments this admission: 3/11 1600 VT = 19 (on vanc 750 q24h prior to 4th dose)  Microbiology results: 3/8 BCx: ngtd 3/8 UCx: 10K Enterococcus (VRE, S- Linezolid) - ID suggests not to treat this currently 3/8 UCx: 50K Enterococcus 3/8 GI panel by PCR: none detected 3/8 sputum:  no result 3/8 MRSA PCR: negative 3/8 strep pneumo ur ag: negative 3/9 Cdiff: neg toxin, neg Ag  Thank you  for allowing pharmacy to be a part of this patient's care.  Kara Mead 02/13/2016 9:46 AM

## 2016-02-13 NOTE — Progress Notes (Addendum)
Patient ID: Brittany Schwartz, female   DOB: 1951-07-15, 65 y.o.   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 She went on to receive 6 cycles of adjuvant chemotherapy with Paclitaxel and carboplatin from 02/16/2015 through 06/16/2015. Vaginal brachytherapy completed on 09/12/2015. Unfortunately she has had symptomatic recurrence with malignant ascites 6 months after completion of chemotherapy. A CT scan from September 2017 showed widespread omental tumor with worsening ascites and bladder invasion. She was recently hospitalized from 01/11/2016 through 01/30/2016 presented with sepsis, pneumonia, respiratory failure requiring intubation. That hospitalization was complicated by acute renal failure requiring CRRT. During that hospitalization she was seen by palliative care. She was discharged to SNF for rehabilitation. She was readmitted to the medicine service on 02/07/2016 presenting with hemoglobin of 6.0 having white count of 23,400. Chest x-ray on admission showing bibasilar opacities consistent with pneumonia. Lactic acid elevated at 2.5. Having sepsis criteria she was started on broad-spectrum IV antibiotic therapy and placed on sepsis protocol. Additionally she was typed and crossed and transfused with packed red blood cells. Repeat lab work showing stable hemoglobin. Renal function showing gradual improvement as well. She remained dyspneic, however, repeat chest x-ray on 02/10/2016 showed interval development of bilateral lower lobe opacities radiology for this could be combination of  infection, edema, atelectasis and pleural effusions. She was given 40 mg of IV Lasix.   Assessment/Plan:    Principal Problem: Sepsis due to bilateral pneumonia / postobstructive pneumonia - Repeat chest x-ray 3/13 showed no significant changes in bilateral pleural effusions and bibasilar atelectasis or consolidation. She could possibly have postobstructive pneumonia in the setting of bilateral pleural effusions - Ultrasound-guided thoracentesis done 02/12/2016 with 700 mL fluid drained and sent for analysis, we'll follow-up a cytology report - White blood cell count is trending up so we will continue current antibiotics - Blood cultures so far are negative - Please note that urine culture with VRE did not require treatment per Dr. Johnnye Sima of ID, there was no official consultation phone call only 3/12 - Respiratory status stable  Active Problems: Antineoplastic induced anemia  - Patient has received 2 units of PRBC transfusion during this hospital stay - Hemoglobin is stable at 9.8 this am - Continue iron supplementation  Hypokalemia / hypomagnesemia - Probably related to Lasix given 02/11/2016 - Both supplemented today - Follow-up BMP and magnesium level tomorrow morning  AKI (acute kidney injury) ([place]) - Required CRRT on a recent hospitalization - Creatinine was 2.3 on the admission and is trending down - BMP pending this am - Lasix given 02/11/2016 for pleural effusions  Endometrial cancer ([place]) - Management per oncology.  - Declined palliative care involvement at this time  Malignant ascites - S/p paracentesis 3/7, 2.8 L fluid removed  Essential hypertension - Systolic blood pressure in 170s range likely because of midodrine so we will stop it and continue to monitor blood pressure without this medication  Non-insulin dependent type 2 diabetes mellitus without complications without long-term insulin use (HCC) - CBGs in past 24 hours; 202, 158, 169  Protein-calorie  malnutrition, severe (HCC) - In the setting of chronic illness - Diet as tolerated   DVT Prophylaxis  - Lovenox subQ  Code Status: Full.  Family Communication:  plan of care discussed with the patient and her daughter. I spoke with her daughter over the phone daily Disposition Plan: Home likely by 02/15/2016, family to decide if they want SNF versus [facility] with [facility]  IV access:  Peripheral IV  Procedures and diagnostic studies:    Dg Chest 1 View 02/12/2016  Post right thoracentesis with improved right effusion. Mild residual opacification in the right base likely small amount of pleural fluid with atelectasis. No pneumothorax. Stable left base opacification likely small effusion with atelectasis.   US Thoracentesis Asp Pleural Space W/img Guide 02/12/2016  Successful ultrasound guided diagnostic and therapeutic right thoracentesis yielding 700 cc of pleural fluid.   Dg Chest 2 View 02/12/2016  No significant change in bilateral pleural effusions and bibasilar atelectasis or consolidation. Electronically Signed   By: Earle Gell M.D.   On: 02/12/2016 08:41   Dg Chest 2 View 02/10/2016  Interval development of bilateral lower lung opacities, potentially combination of infection, edema, atelectasis, and bilateral pleural effusions. Signed, Dulcy Fanny. Earleen Newport, DO [facility] Radiology Electronically Signed   By: Corrie Mckusick D.O.   On: 02/10/2016 10:37   Dg Chest 2 View 02/07/2016  Bibasilar opacities concerning for pneumonia. Small effusions. No real change since prior study. Electronically Signed   By: Rolm Baptise M.D.   On: 02/07/2016 14:24   US Paracentesis 02/06/2016 Successful ultrasound-guided therapeutic paracentesis yielding 2.8 liters of peritoneal fluid. Read by: Rowe Robert, PA-C Electronically Signed   By: Sandi Mariscal M.D.   On: 02/06/2016 12:21   IAnti-Infectives:   Cefepime and vancomycin   Leisa Lenz, MD  [facility] Pager  [phone]  Time spent in minutes: 25 minutes  If 7PM-7AM, please contact night-coverage [email or web address] Password [email or web address] 02/13/2016, 10:01 AM   LOS: 6 days    HPI/Subjective: No acute overnight events. Patient reports feeling better.   Objective: Filed Vitals:   02/12/16 1509 02/12/16 2015 02/12/16 2023 02/13/16 0649  BP: 143/70  169/72 179/77  Pulse: 89  94 88  Temp: 97.7 F (36.5 C)  98.2 F (36.8 C) 98 F (36.7 C)  TempSrc: Oral  Oral Oral  Resp: 20  20 20   Height:      Weight:      SpO2: 95% 100% 98% 100%    Intake/Output Summary (Last 24 hours) at 02/13/16 1001 Last data filed at 02/13/16 0900  Gross per 24 hour  Intake    630 ml  Output   1150 ml  Net   -520 ml    Exam:   General:  Pt is alert, not in acute distress  Cardiovascular: RRR, S1/S2 (+)  Respiratory: more air entry bilaterally, no wheezing   Abdomen: (+) BS, non tender   Extremities: No swelling, pulses palpable   Neuro: Nonfocal  Data Reviewed: Basic Metabolic Panel:  Recent Labs Lab 02/07/16 1224 02/07/16 2143 02/08/16 0820 02/09/16 0520 02/10/16 0450 02/12/16 0348  NA 142  --  142 145 141 142  K 4.2  --  3.8 3.9 3.5 2.8*  CL 109  --  108 110 108 104  CO2 22  --  23 23 22 30   GLUCOSE 270*  --  222* 144* 186* 159*  BUN 28*  --  23* 20 17 17   CREATININE 1.96*  --  1.86* 1.76* 1.67* 1.49*  CALCIUM 7.5*  --  7.5* 7.9* 7.9* 7.8*  MG  --  1.2*  --   --   --   --  Liver Function Tests:  Recent Labs Lab 02/07/16 1344 02/12/16 0348  AST 32 19  ALT 36 26  ALKPHOS 117 125  BILITOT 0.7 0.5  PROT 5.1* 5.3*  ALBUMIN 1.4* 1.5*   No results for input(s): LIPASE, AMYLASE in the last 168 hours.  Recent Labs Lab 02/11/16 1700  AMMONIA 23   CBC:  Recent Labs Lab 02/07/16 1224 02/08/16 0820 02/10/16 0450 02/12/16 0348 02/13/16 0945  WBC 23.4* 19.7* 20.4* 17.9* 19.2*  NEUTROABS 22.7*  --   --   --   --   HGB 6.0* 9.0* 9.2* 9.0* 9.8*  HCT 18.5* 27.7* 28.0* 28.9* 31.7*   MCV 89.4 86.8 88.1 91.2 91.1  PLT 254 214 228 223 204   Cardiac Enzymes: No results for input(s): CKTOTAL, CKMB, CKMBINDEX, TROPONINI in the last 168 hours. BNP: Invalid input(s): POCBNP CBG:  Recent Labs Lab 02/12/16 0741 02/12/16 1152 02/12/16 1638 02/12/16 2043 02/13/16 0814  GLUCAP 141* 166* 202* 158* 169*    Recent Results (from the past 240 hour(s))  Blood Culture (routine x 2)     Status: None   Collection Time: 02/07/16  1:44 PM  Result Value Ref Range Status   Specimen Description BLOOD RIGHT ARM  Final   Special Requests BOTTLES DRAWN AEROBIC AND ANAEROBIC 5CC  Final   Culture   Final    NO GROWTH 5 DAYS Performed at [facility]    Report Status 02/12/2016 FINAL  Final  Urine culture     Status: None (Preliminary result)   Collection Time: 02/07/16  1:54 PM  Result Value Ref Range Status   Specimen Description URINE, CLEAN CATCH  Final   Special Requests NONE  Final   Culture   Final    50,000 COLONIES/mL ENTEROCOCCUS SPECIES DIFFICULTY OBTAINING DEFINITIVE SENSITIVITIES Performed at [facility]    Report Status PENDING  Incomplete  MRSA PCR Screening     Status: None   Collection Time: 02/07/16  9:14 PM  Result Value Ref Range Status   MRSA by PCR NEGATIVE NEGATIVE Final    Comment:        The GeneXpert MRSA Assay (FDA approved for NASAL specimens only), is one component of a comprehensive MRSA colonization surveillance program. It is not intended to diagnose MRSA infection nor to guide or monitor treatment for MRSA infections.   Blood Culture (routine x 2)     Status: None (Preliminary result)   Collection Time: 02/07/16  9:43 PM  Result Value Ref Range Status   Specimen Description BLOOD RIGHT ANTECUBITAL  Final   Special Requests IN PEDIATRIC BOTTLE 3ML  Final   Culture   Final    NO GROWTH 4 DAYS Performed at [facility]    Report Status PENDING  Incomplete  Urine culture     Status: None   Collection Time:  02/07/16 10:40 PM  Result Value Ref Range Status   Specimen Description URINE, RANDOM  Final   Special Requests NONE  Final   Culture   Final    10,000 COLONIES/mL VANCOMYCIN RESISTANT ENTEROCOCCUS Performed at [facility]    Report Status 02/11/2016 FINAL  Final   Organism ID, Bacteria VANCOMYCIN RESISTANT ENTEROCOCCUS  Final      Susceptibility   Vancomycin resistant enterococcus - MIC*    AMPICILLIN >=32 RESISTANT Resistant     LEVOFLOXACIN >=8 RESISTANT Resistant     NITROFURANTOIN 256 RESISTANT Resistant     VANCOMYCIN >=32 RESISTANT Resistant  LINEZOLID 2 SENSITIVE Sensitive     * 10,000 COLONIES/mL VANCOMYCIN RESISTANT ENTEROCOCCUS  Gastrointestinal Panel by PCR , Stool     Status: None   Collection Time: 02/08/16 12:19 PM  Result Value Ref Range Status   Campylobacter species NOT DETECTED NOT DETECTED Final   Plesimonas shigelloides NOT DETECTED NOT DETECTED Final   Salmonella species NOT DETECTED NOT DETECTED Final   Yersinia enterocolitica NOT DETECTED NOT DETECTED Final   Vibrio species NOT DETECTED NOT DETECTED Final   Vibrio cholerae NOT DETECTED NOT DETECTED Final   Enteroaggregative E coli (EAEC) NOT DETECTED NOT DETECTED Final   Enteropathogenic E coli (EPEC) NOT DETECTED NOT DETECTED Final   Enterotoxigenic E coli (ETEC) NOT DETECTED NOT DETECTED Final   Shiga like toxin producing E coli (STEC) NOT DETECTED NOT DETECTED Final   E. coli O157 NOT DETECTED NOT DETECTED Final   Shigella/Enteroinvasive E coli (EIEC) NOT DETECTED NOT DETECTED Final   Cryptosporidium NOT DETECTED NOT DETECTED Final   Cyclospora cayetanensis NOT DETECTED NOT DETECTED Final   Entamoeba histolytica NOT DETECTED NOT DETECTED Final   Giardia lamblia NOT DETECTED NOT DETECTED Final   Adenovirus F40/41 NOT DETECTED NOT DETECTED Final   Astrovirus NOT DETECTED NOT DETECTED Final   Norovirus GI/GII NOT DETECTED NOT DETECTED Final   Rotavirus A NOT DETECTED NOT DETECTED Final    Sapovirus (I, II, IV, and V) NOT DETECTED NOT DETECTED Final  C difficile quick scan w PCR reflex     Status: None   Collection Time: 02/08/16 12:19 PM  Result Value Ref Range Status   C Diff antigen NEGATIVE NEGATIVE Final   C Diff toxin NEGATIVE NEGATIVE Final   C Diff interpretation Negative for toxigenic C. difficile  Final  Culture, sputum-assessment     Status: None   Collection Time: 02/11/16 11:47 PM  Result Value Ref Range Status   Specimen Description SPUTUM  Final   Special Requests NONE  Final   Sputum evaluation   Final    THIS SPECIMEN IS ACCEPTABLE. RESPIRATORY CULTURE REPORT TO FOLLOW.   Report Status 02/12/2016 FINAL  Final  Culture, respiratory (NON-Expectorated)     Status: None (Preliminary result)   Collection Time: 02/11/16 11:47 PM  Result Value Ref Range Status   Specimen Description SPUTUM  Final   Special Requests NONE  Final   Gram Stain   Final    FEW WBC PRESENT, PREDOMINANTLY PMN NO SQUAMOUS EPITHELIAL CELLS SEEN NO ORGANISMS SEEN THIS SPECIMEN IS ACCEPTABLE FOR SPUTUM CULTURE Performed at [facility]    Culture   Final    NO GROWTH 1 DAY Performed at [facility]    Report Status PENDING  Incomplete  Gram stain     Status: None   Collection Time: 02/12/16  2:24 PM  Result Value Ref Range Status   Specimen Description FLUID RIGHT  Final   Special Requests NONE  Final   Gram Stain   Final    FEW WBC PRESENT,BOTH PMN AND MONONUCLEAR NO ORGANISMS SEEN Performed at [facility]    Report Status 02/13/2016 FINAL  Final     Scheduled Meds: . albuterol  2.5 mg Nebulization TID  . ceFEPime (MAXIPIME)  1 g Intravenous Q24H  . enoxaparin (LOVENOX) injection  30 mg Subcutaneous Q24H  . Ferrous Fumarate  1 tablet Oral BID  . insulin aspart  0-9 Units Subcutaneous TID WC  . midodrine  5 mg Oral TID WC  . nystatin  5 mL Oral QID  . pravastatin  20 mg Oral Daily  . vancomycin  750 mg Intravenous Q24H

## 2016-02-13 NOTE — Telephone Encounter (Signed)
per pof to sch pt appt so that appt avaiable for pt at [facility] pt a message as well

## 2016-02-13 NOTE — Progress Notes (Signed)
Physical Therapy Treatment Patient Details Name: Brittany Schwartz MRN: [id number] DOB: Apr 23, 1951 Today's Date: 02/13/2016    History of Present Illness 65 yo female admitted with bil PNA. hx of endometrial cancer, malignant ascites. Recent d/c to [facility] 2/28.     PT Comments    Pt feeling "lazy" but excited to get home soon.  Willing to try, pt agreed to get OOB to amb in hallway at [place] with family following with chair.    Follow Up Recommendations  Home health PT;Supervision/Assistance - 24 hour;SNF     Equipment Recommendations  Rolling walker with 5" wheels;3in1 (PT)    Recommendations for Other Services       Precautions / Restrictions Precautions Precautions: Fall Precaution Comments: frail Restrictions Weight Bearing Restrictions: No    Mobility  Bed Mobility Overal bed mobility: Needs Assistance Bed Mobility: Supine to Sit   Sidelying to sit: Min assist       General bed mobility comments: Assist to get to EOB. Increased time.   Transfers Overall transfer level: Needs assistance Equipment used: Rolling walker (2 wheeled) Transfers: Sit to/from Stand Sit to Stand: Min assist;+2 safety/equipment         General transfer comment: Assist to rise, stabilize, control descent. VCs safety, technique, hand placement  Ambulation/Gait Ambulation/Gait assistance: Min assist;+2 safety/equipment Ambulation Distance (Feet): 45 Feet Assistive device: Rolling walker (2 wheeled) Gait Pattern/deviations: Step-through pattern;Decreased stride length;Trunk flexed     General Gait Details: Assist to stabilize pt. Pt tolerated short distance fairly well. VCs safety.    Stairs            Wheelchair Mobility    Modified Rankin (Stroke Patients Only)       Balance                                    Cognition Arousal/Alertness: Awake/alert Behavior During Therapy: WFL for tasks assessed/performed Overall Cognitive Status: Within  Functional Limits for tasks assessed                      Exercises      General Comments        Pertinent Vitals/Pain Pain Assessment: No/denies pain    Home Living                      Prior Function            PT Goals (current goals can now be found in the care plan section) Progress towards PT goals: Progressing toward goals    Frequency  Min 3X/week    PT Plan Current plan remains appropriate    Co-evaluation             End of Session Equipment Utilized During Treatment: Gait belt Activity Tolerance: Patient tolerated treatment well Patient left: in chair;with call bell/phone within reach;with chair alarm set;with family/visitor present     Time: 1135-1200 PT Time Calculation (min) (ACUTE ONLY): 25 min  Charges:  $Gait Training: 8-22 mins $Therapeutic Activity: 8-22 mins                    G Codes:      Rica Koyanagi  PTA [facility]  Acute  Rehab Pager      [phone]

## 2016-02-14 LAB — CBC
HCT: 29.9 % — ABNORMAL LOW (ref 36.0–46.0)
Hemoglobin: 9.4 g/dL — ABNORMAL LOW (ref 12.0–15.0)
MCH: 28.5 pg (ref 26.0–34.0)
MCHC: 31.4 g/dL (ref 30.0–36.0)
MCV: 90.6 fL (ref 78.0–100.0)
PLATELETS: 184 10*3/uL (ref 150–400)
RBC: 3.3 MIL/uL — ABNORMAL LOW (ref 3.87–5.11)
RDW: 16.3 % — AB (ref 11.5–15.5)
WBC: 17.5 10*3/uL — ABNORMAL HIGH (ref 4.0–10.5)

## 2016-02-14 LAB — BASIC METABOLIC PANEL
Anion gap: 9 (ref 5–15)
BUN: 14 mg/dL (ref 6–20)
CO2: 33 mmol/L — ABNORMAL HIGH (ref 22–32)
CREATININE: 1.28 mg/dL — AB (ref 0.44–1.00)
Calcium: 7.8 mg/dL — ABNORMAL LOW (ref 8.9–10.3)
Chloride: 101 mmol/L (ref 101–111)
GFR calc Af Amer: 50 mL/min — ABNORMAL LOW (ref >60)
GFR, EST NON AFRICAN AMERICAN: 43 mL/min — AB (ref >60)
GLUCOSE: 139 mg/dL — AB (ref 65–99)
Potassium: 3.3 mmol/L — ABNORMAL LOW (ref 3.5–5.1)
SODIUM: 143 mmol/L (ref 135–145)

## 2016-02-14 LAB — CULTURE, RESPIRATORY W GRAM STAIN: Culture: NORMAL

## 2016-02-14 LAB — CULTURE, RESPIRATORY

## 2016-02-14 LAB — GLUCOSE, CAPILLARY
GLUCOSE-CAPILLARY: 210 mg/dL — AB (ref 65–99)
GLUCOSE-CAPILLARY: 212 mg/dL — AB (ref 65–99)
Glucose-Capillary: 131 mg/dL — ABNORMAL HIGH (ref 65–99)
Glucose-Capillary: 232 mg/dL — ABNORMAL HIGH (ref 65–99)

## 2016-02-14 LAB — MAGNESIUM: MAGNESIUM: 1.5 mg/dL — AB (ref 1.7–2.4)

## 2016-02-14 MED ORDER — MAGNESIUM SULFATE 2 GM/50ML IV SOLN
2.0000 g | Freq: Once | INTRAVENOUS | Status: AC
  Administered 2016-02-14: 2 g via INTRAVENOUS
  Filled 2016-02-14: qty 50

## 2016-02-14 MED ORDER — POTASSIUM CHLORIDE CRYS ER 20 MEQ PO TBCR
40.0000 meq | EXTENDED_RELEASE_TABLET | Freq: Once | ORAL | Status: AC
  Administered 2016-02-14: 40 meq via ORAL
  Filled 2016-02-14: qty 2

## 2016-02-14 NOTE — Progress Notes (Signed)
Per RN, [place] - patient plans to go home with home health services upon discharge.   No further CSW needs identified - CSW signing off.   Raynaldo Opitz, [facility] Clinical Social Worker cell #: [phone]

## 2016-02-14 NOTE — Progress Notes (Signed)
[facility] is providing the following services: hospital bed, wheelchair, rw, and commode. (rec'd order for home oxygen - holding for oxygen desat screen for qualification)  If patient discharges after hours, please call [phone].   Linward Headland 02/14/2016, 4:52 PM

## 2016-02-14 NOTE — Progress Notes (Signed)
Spoke with pt and family at bedside concerning discharge plans. Pt plan to discharge home with [facility] and will need DME.

## 2016-02-14 NOTE — Progress Notes (Addendum)
Patient ID: Brittany Schwartz, female   DOB: 06/23/1951, 65 y.o.   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 She went on to receive 6 cycles of adjuvant chemotherapy with Paclitaxel and carboplatin from 02/16/2015 through 06/16/2015. Vaginal brachytherapy completed on 09/12/2015. Unfortunately she has had symptomatic recurrence with malignant ascites 6 months after completion of chemotherapy. A CT scan from September 2017 showed widespread omental tumor with worsening ascites and bladder invasion. She was recently hospitalized from 01/11/2016 through 01/30/2016 presented with sepsis, pneumonia, respiratory failure requiring intubation. That hospitalization was complicated by acute renal failure requiring CRRT. During that hospitalization she was seen by palliative care. She was discharged to SNF for rehabilitation. She was readmitted to the medicine service on 02/07/2016 presenting with hemoglobin of 6.0 having white count of 23,400. Chest x-ray on admission showing bibasilar opacities consistent with pneumonia. Lactic acid elevated at 2.5. Having sepsis criteria she was started on broad-spectrum IV antibiotic therapy and placed on sepsis protocol. Additionally she was typed and crossed and transfused with packed red blood cells. Repeat lab work showing stable hemoglobin. Renal function showing gradual improvement as well. She remained dyspneic, however, repeat chest x-ray on 02/10/2016 showed interval development of bilateral lower lobe opacities radiology for this could be combination of  infection, edema, atelectasis and pleural effusions. She was given 40 mg of IV Lasix."  Assessment/Plan:    Principal Problem: Sepsis due to bilateral pneumonia / postobstructive pneumonia - Repeat chest x-ray 3/13 showed no significant changes in bilateral pleural effusions and bibasilar atelectasis or consolidation. She could possibly have postobstructive pneumonia in the setting of bilateral pleural effusions. Continue cefepime and vanco and switch to Levaquin tomorrow  - Ultrasound-guided thoracentesis done 02/12/2016 with 700 mL fluid drained - Cytology from pleural fluid is pending - Blood cultures so far are negative - Of note, urine culture with VRE but did not require treatment per Dr. Johnnye Sima of ID, there was no official consultation phone call only 3/12  Active Problems: Antineoplastic induced anemia  - Patient has received 2 units of PRBC transfusion during this hospital stay - Hemoglobin stable  - Continue iron supplementation  Hypokalemia / hypomagnesemia - Probably related to Lasix given 02/11/2016 - Both supplemented yesterday and today - F/u BMP and magnesium level in am  AKI (acute kidney injury) ([place]) - Required CRRT on a recent hospitalization - Creatinine was 2.3 on the admission and is trending down to 1.28 this am - Lasix given 02/11/2016 for pleural effusions  Endometrial cancer ([place]) - Management per oncology.  - Declined palliative care involvement at this time  Malignant ascites - S/p paracentesis 3/7, 2.8 L fluid removed  Essential hypertension - Systolic blood pressure was in 170s range 3/13 and 3/14 so stopped midodrine - SBP in 120's this am  Non-insulin dependent type 2 diabetes mellitus without complications without long-term insulin use (HCC) - Continue SSI  Protein-calorie malnutrition, severe (HCC) - In the setting of chronic illness - Diet as tolerated   DVT Prophylaxis  - Lovenox subQ   Code Status: Full.  Family Communication:   plan of care discussed with the patient and her daughter. I spoke with her daughter over the  phone daily Disposition Plan: Home likely by 02/15/2016, family to decide if they want SNF versus home with [facility]  IV access:  Peripheral IV  Procedures and diagnostic studies:    Dg Chest 1 View 02/12/2016  Post right thoracentesis with improved right effusion. Mild residual opacification in the right base likely small amount of pleural fluid with atelectasis. No pneumothorax. Stable left base opacification likely small effusion with atelectasis.   US Thoracentesis Asp Pleural Space W/img Guide 02/12/2016  Successful ultrasound guided diagnostic and therapeutic right thoracentesis yielding 700 cc of pleural fluid.   Dg Chest 2 View 02/12/2016  No significant change in bilateral pleural effusions and bibasilar atelectasis or consolidation. Electronically Signed   By: Earle Gell M.D.   On: 02/12/2016 08:41   Dg Chest 2 View 02/10/2016  Interval development of bilateral lower lung opacities, potentially combination of infection, edema, atelectasis, and bilateral pleural effusions. Signed, Dulcy Fanny. Earleen Newport, DO [facility] Electronically Signed   By: Corrie Mckusick D.O.   On: 02/10/2016 10:37   Dg Chest 2 View 02/07/2016  Bibasilar opacities concerning for pneumonia. Small effusions. No real change since prior study. Electronically Signed   By: Rolm Baptise M.D.   On: 02/07/2016 14:24   US Paracentesis 02/06/2016 Successful ultrasound-guided therapeutic paracentesis yielding 2.8 liters of peritoneal fluid. Read by: Rowe Robert, PA-C Electronically Signed   By: Sandi Mariscal M.D.   On: 02/06/2016 12:21   IAnti-Infectives:   Cefepime and vancomycin   Leisa Lenz, MD  [facility] Pager [phone]  Time spent in minutes: 15 minutes  If 7PM-7AM, please contact night-coverage [email or web address] Password [facility] 02/14/2016, 9:53 AM   LOS: 7 days     HPI/Subjective: No acute overnight events. Patient reports she slept good.  Objective: Filed Vitals:   02/13/16 2110 02/13/16 2328 02/14/16 0520 02/14/16 0726  BP:  145/75 152/69   Pulse:  83 85 86  Temp:  97.5 F (36.4 C) 97.8 F (36.6 C)   TempSrc:  Oral Oral   Resp:  20 21 20   Height:      Weight:      SpO2: 95% 100% 100% 99%    Intake/Output Summary (Last 24 hours) at 02/14/16 0953 Last data filed at 02/14/16 0542  Gross per 24 hour  Intake    510 ml  Output   2075 ml  Net  -1565 ml    Exam:   General:  Pt is not in acute distress  Cardiovascular: Rate controlled, S1/S2 appreciated   Respiratory: no wheezing, no rhonchi   Abdomen: non tender, softly distended, (+) BS  Extremities: No edema, bilateral pulses    Neuro: No focal deficits   Data Reviewed: Basic Metabolic Panel:  Recent Labs Lab 02/07/16 2143  02/09/16 0520 02/10/16 0450 02/12/16 0348 02/13/16 0945 02/14/16 0420  NA  --   < > 145 141 142 139 143  K  --   < > 3.9 3.5 2.8* 2.8* 3.3*  CL  --   < > 110 108 104 102 101  CO2  --   < > 23 22 30 30  33*  GLUCOSE  --   < > 144* 186* 159* 204* 139*  BUN  --   < > 20 17 17 15 14   CREATININE  --   < > 1.76* 1.67* 1.49* 1.42* 1.28*  CALCIUM  --   < > 7.9* 7.9* 7.8* 7.6* 7.8*  MG 1.2*  --   --   --   --  1.0* 1.5*  < > = values in this interval not displayed. Liver Function Tests:  Recent Labs Lab 02/07/16 1344 02/12/16 0348  AST 32 19  ALT 36 26  ALKPHOS 117 125  BILITOT 0.7 0.5  PROT 5.1* 5.3*  ALBUMIN 1.4* 1.5*   No results for input(s): LIPASE, AMYLASE in the last 168 hours.  Recent Labs Lab 02/11/16 1700  AMMONIA 23   CBC:  Recent Labs Lab 02/07/16 1224 02/08/16 0820 02/10/16 0450 02/12/16 0348 02/13/16 0945 02/14/16 0420  WBC 23.4* 19.7* 20.4* 17.9* 19.2* 17.5*  NEUTROABS 22.7*  --   --   --   --   --   HGB 6.0* 9.0* 9.2* 9.0* 9.8* 9.4*  HCT 18.5* 27.7* 28.0* 28.9* 31.7* 29.9*  MCV 89.4 86.8 88.1 91.2 91.1  90.6  PLT 254 214 228 223 204 184   Cardiac Enzymes: No results for input(s): CKTOTAL, CKMB, CKMBINDEX, TROPONINI in the last 168 hours. BNP: Invalid input(s): POCBNP CBG:  Recent Labs Lab 02/13/16 0814 02/13/16 1213 02/13/16 1802 02/13/16 2323 02/14/16 0838  GLUCAP 169* 149* 268* 143* 131*    Recent Results (from the past 240 hour(s))  Blood Culture (routine x 2)     Status: None   Collection Time: 02/07/16  1:44 PM  Result Value Ref Range Status   Specimen Description BLOOD RIGHT ARM  Final   Special Requests BOTTLES DRAWN AEROBIC AND ANAEROBIC 5CC  Final   Culture   Final    NO GROWTH 5 DAYS Performed at [facility]    Report Status 02/12/2016 FINAL  Final  Urine culture     Status: None   Collection Time: 02/07/16  1:54 PM  Result Value Ref Range Status   Specimen Description URINE, CLEAN CATCH  Final   Special Requests NONE  Final   Culture   Final    50,000 COLONIES/mL ENTEROCOCCUS SPECIES SUSCEPTIBILITIES PERFORMED ON PREVIOUS CULTURE WITHIN THE LAST 5 DAYS. Performed at [facility]    Report Status 02/13/2016 FINAL  Final  MRSA PCR Screening     Status: None   Collection Time: 02/07/16  9:14 PM  Result Value Ref Range Status   MRSA by PCR NEGATIVE NEGATIVE Final    Comment:        The GeneXpert MRSA Assay (FDA approved for NASAL specimens only), is one component of a comprehensive MRSA colonization surveillance program. It is not intended to diagnose MRSA infection nor to guide or monitor treatment for MRSA infections.   Blood Culture (routine x 2)     Status: None   Collection Time: 02/07/16  9:43 PM  Result Value Ref Range Status   Specimen Description BLOOD RIGHT ANTECUBITAL  Final   Special Requests IN PEDIATRIC BOTTLE 3ML  Final   Culture   Final    NO GROWTH 5 DAYS Performed at [facility]    Report Status 02/13/2016 FINAL  Final  Urine culture     Status: None   Collection Time: 02/07/16 10:40 PM  Result Value  Ref Range Status   Specimen Description URINE, RANDOM  Final   Special Requests NONE  Final   Culture   Final    10,000 COLONIES/mL VANCOMYCIN RESISTANT ENTEROCOCCUS Performed at [facility]    Report Status 02/11/2016 FINAL  Final   Organism ID, Bacteria VANCOMYCIN RESISTANT ENTEROCOCCUS  Final      Susceptibility   Vancomycin resistant enterococcus - MIC*    AMPICILLIN >=32 RESISTANT Resistant  LEVOFLOXACIN >=8 RESISTANT Resistant     NITROFURANTOIN 256 RESISTANT Resistant     VANCOMYCIN >=32 RESISTANT Resistant     LINEZOLID 2 SENSITIVE Sensitive     * 10,000 COLONIES/mL VANCOMYCIN RESISTANT ENTEROCOCCUS  Gastrointestinal Panel by PCR , Stool     Status: None   Collection Time: 02/08/16 12:19 PM  Result Value Ref Range Status   Campylobacter species NOT DETECTED NOT DETECTED Final   Plesimonas shigelloides NOT DETECTED NOT DETECTED Final   Salmonella species NOT DETECTED NOT DETECTED Final   Yersinia enterocolitica NOT DETECTED NOT DETECTED Final   Vibrio species NOT DETECTED NOT DETECTED Final   Vibrio cholerae NOT DETECTED NOT DETECTED Final   Enteroaggregative E coli (EAEC) NOT DETECTED NOT DETECTED Final   Enteropathogenic E coli (EPEC) NOT DETECTED NOT DETECTED Final   Enterotoxigenic E coli (ETEC) NOT DETECTED NOT DETECTED Final   Shiga like toxin producing E coli (STEC) NOT DETECTED NOT DETECTED Final   E. coli O157 NOT DETECTED NOT DETECTED Final   Shigella/Enteroinvasive E coli (EIEC) NOT DETECTED NOT DETECTED Final   Cryptosporidium NOT DETECTED NOT DETECTED Final   Cyclospora cayetanensis NOT DETECTED NOT DETECTED Final   Entamoeba histolytica NOT DETECTED NOT DETECTED Final   Giardia lamblia NOT DETECTED NOT DETECTED Final   Adenovirus F40/41 NOT DETECTED NOT DETECTED Final   Astrovirus NOT DETECTED NOT DETECTED Final   Norovirus GI/GII NOT DETECTED NOT DETECTED Final   Rotavirus A NOT DETECTED NOT DETECTED Final   Sapovirus (I, II, IV, and V) NOT  DETECTED NOT DETECTED Final  C difficile quick scan w PCR reflex     Status: None   Collection Time: 02/08/16 12:19 PM  Result Value Ref Range Status   C Diff antigen NEGATIVE NEGATIVE Final   C Diff toxin NEGATIVE NEGATIVE Final   C Diff interpretation Negative for toxigenic C. difficile  Final  Culture, sputum-assessment     Status: None   Collection Time: 02/11/16 11:47 PM  Result Value Ref Range Status   Specimen Description SPUTUM  Final   Special Requests NONE  Final   Sputum evaluation   Final    THIS SPECIMEN IS ACCEPTABLE. RESPIRATORY CULTURE REPORT TO FOLLOW.   Report Status 02/12/2016 FINAL  Final  Culture, respiratory (NON-Expectorated)     Status: None   Collection Time: 02/11/16 11:47 PM  Result Value Ref Range Status   Specimen Description SPUTUM  Final   Special Requests NONE  Final   Gram Stain   Final    FEW WBC PRESENT, PREDOMINANTLY PMN NO SQUAMOUS EPITHELIAL CELLS SEEN NO ORGANISMS SEEN THIS SPECIMEN IS ACCEPTABLE FOR SPUTUM CULTURE Performed at [facility]    Culture   Final    NORMAL OROPHARYNGEAL FLORA Performed at [facility]    Report Status 02/14/2016 FINAL  Final  Gram stain     Status: None   Collection Time: 02/12/16  2:24 PM  Result Value Ref Range Status   Specimen Description FLUID RIGHT  Final   Special Requests NONE  Final   Gram Stain   Final    FEW WBC PRESENT,BOTH PMN AND MONONUCLEAR NO ORGANISMS SEEN Performed at [facility]    Report Status 02/13/2016 FINAL  Final     Scheduled Meds: . albuterol  2.5 mg Nebulization TID  . ceFEPime (MAXIPIME)  1 g Intravenous Q24H  . enoxaparin (LOVENOX) injection  30 mg Subcutaneous Q24H  . Ferrous Fumarate  1 tablet Oral BID  .  insulin aspart  0-9 Units Subcutaneous TID WC  . midodrine  5 mg Oral TID WC  . nystatin  5 mL Oral QID  . pravastatin  20 mg Oral Daily  . vancomycin  750 mg Intravenous Q24H

## 2016-02-15 ENCOUNTER — Inpatient Hospital Stay (HOSPITAL_COMMUNITY): Payer: BLUE CROSS/BLUE SHIELD

## 2016-02-15 LAB — GLUCOSE, CAPILLARY
GLUCOSE-CAPILLARY: 150 mg/dL — AB (ref 65–99)
Glucose-Capillary: 141 mg/dL — ABNORMAL HIGH (ref 65–99)

## 2016-02-15 LAB — BASIC METABOLIC PANEL
Anion gap: 9 (ref 5–15)
BUN: 14 mg/dL (ref 6–20)
CHLORIDE: 102 mmol/L (ref 101–111)
CO2: 33 mmol/L — AB (ref 22–32)
CREATININE: 1.22 mg/dL — AB (ref 0.44–1.00)
Calcium: 8 mg/dL — ABNORMAL LOW (ref 8.9–10.3)
GFR calc non Af Amer: 45 mL/min — ABNORMAL LOW (ref >60)
GFR, EST AFRICAN AMERICAN: 53 mL/min — AB (ref >60)
Glucose, Bld: 167 mg/dL — ABNORMAL HIGH (ref 65–99)
POTASSIUM: 3.4 mmol/L — AB (ref 3.5–5.1)
SODIUM: 144 mmol/L (ref 135–145)

## 2016-02-15 LAB — MAGNESIUM: MAGNESIUM: 1.7 mg/dL (ref 1.7–2.4)

## 2016-02-15 MED ORDER — ALBUTEROL SULFATE (2.5 MG/3ML) 0.083% IN NEBU: 2.5000 mg | INHALATION_SOLUTION | Freq: Four times a day (QID) | RESPIRATORY_TRACT | Status: DC | PRN

## 2016-02-15 MED ORDER — LEVOFLOXACIN 500 MG PO TABS: 500.0000 mg | ORAL_TABLET | Freq: Every day | ORAL | Status: DC

## 2016-02-15 MED ORDER — GLUCERNA SHAKE PO LIQD: 237.0000 mL | Freq: Three times a day (TID) | ORAL | Status: AC

## 2016-02-15 MED ORDER — HEPARIN SOD (PORK) LOCK FLUSH 100 UNIT/ML IV SOLN
500.0000 [IU] | INTRAVENOUS | Status: DC
  Filled 2016-02-15: qty 5

## 2016-02-15 MED ORDER — NYSTATIN 100000 UNIT/ML MT SUSP: 5.0000 mL | Freq: Four times a day (QID) | OROMUCOSAL | Status: DC

## 2016-02-15 MED ORDER — HEPARIN SOD (PORK) LOCK FLUSH 100 UNIT/ML IV SOLN
500.0000 [IU] | INTRAVENOUS | Status: DC | PRN
  Administered 2016-02-15: 500 [IU]
  Filled 2016-02-15 (×2): qty 5

## 2016-02-15 MED ORDER — POTASSIUM CHLORIDE CRYS ER 20 MEQ PO TBCR
40.0000 meq | EXTENDED_RELEASE_TABLET | Freq: Once | ORAL | Status: AC
  Administered 2016-02-15: 40 meq via ORAL
  Filled 2016-02-15: qty 2

## 2016-02-15 MED ORDER — GUAIFENESIN-DM 100-10 MG/5ML PO SYRP: 5.0000 mL | ORAL_SOLUTION | ORAL | Status: DC | PRN

## 2016-02-15 NOTE — Progress Notes (Signed)
SATURATION QUALIFICATIONS: (This note is used to comply with regulatory documentation for home oxygen)  Patient Saturations on Room Air at Rest = 98%  Patient Saturations on Room Air while Ambulating = 98%  Patient Saturations on 0 Liters of oxygen while Ambulating = 98%  Please briefly explain why patient needs home oxygen:  

## 2016-02-15 NOTE — Progress Notes (Signed)
Physical Therapy Treatment Patient Details Name: Brittany Schwartz MRN: [id number] DOB: 01/27/1951 Today's Date: 02/15/2016    History of Present Illness 65 yo female admitted with bil PNA. hx of endometrial cancer, malignant ascites. Recent d/c to [facility] 2/28.     PT Comments    Assisted OOB to bathroom then amb in hallway.    Follow Up Recommendations  Home health PT;Supervision/Assistance - 24 hour     Equipment Recommendations  Rolling walker with 5" wheels;3in1 (PT)    Recommendations for Other Services       Precautions / Restrictions Precautions Precautions: Fall Precaution Comments: frail Restrictions Weight Bearing Restrictions: No    Mobility  Bed Mobility Overal bed mobility: Needs Assistance Bed Mobility: Supine to Sit   Sidelying to sit: Min assist Supine to sit: Min assist;HOB elevated     General bed mobility comments: Assist to get to EOB. Increased time. then back into bed.   Transfers Overall transfer level: Needs assistance Equipment used: Rolling walker (2 wheeled) Transfers: Sit to/from Stand Sit to Stand: Min assist;+2 safety/equipment         General transfer comment: Assist to rise, stabilize, control descent. VCs safety, technique, hand placement  assisted on/off toilet  Ambulation/Gait Ambulation/Gait assistance: Min assist Ambulation Distance (Feet): 38 Feet Assistive device: 4-wheeled walker Gait Pattern/deviations: Step-to pattern;Step-through pattern;Trunk flexed     General Gait Details: assisted with balance during turns.  RA avg 90%.  HR 129.  Limited activity tolerance.     Stairs            Wheelchair Mobility    Modified Rankin (Stroke Patients Only)       Balance                                    Cognition Arousal/Alertness: Awake/alert Behavior During Therapy: WFL for tasks assessed/performed Overall Cognitive Status: Within Functional Limits for tasks assessed                       Exercises      General Comments        Pertinent Vitals/Pain Pain Assessment: No/denies pain    Home Living                      Prior Function            PT Goals (current goals can now be found in the care plan section) Progress towards PT goals: Progressing toward goals    Frequency  Min 3X/week    PT Plan Current plan remains appropriate    Co-evaluation             End of Session Equipment Utilized During Treatment: Gait belt Activity Tolerance: Patient tolerated treatment well Patient left: with call bell/phone within reach;with chair alarm set;with family/visitor present;in bed     Time: 1020-1045 PT Time Calculation (min) (ACUTE ONLY): 25 min  Charges:  $Gait Training: 8-22 mins $Therapeutic Activity: 8-22 mins                    G Codes:      Rica Koyanagi  PTA [facility]  Acute  Rehab Pager      [phone]

## 2016-02-15 NOTE — Discharge Summary (Signed)
Physician Discharge Summary  Brittany Schwartz [id number] DOB: January 14, 1951 DOA: 02/07/2016  PCP: Brittany Greenhouse, MD  Admit date: 02/07/2016 Discharge date: 02/15/2016  Recommendations for Outpatient Follow-up:  Midodrine on hold since blood pressure stable. Use only if low blood pressure, less than 90/60  Discharge Diagnoses:  Principal Problem:   Bilateral pneumonia Active Problems:   Endometrial cancer ([place])   Essential hypertension   Chemotherapy-induced peripheral neuropathy (HCC)   Anemia associated with chemotherapy   Non-insulin dependent type 2 diabetes mellitus (HCC)   Malignant ascites   AKI (acute kidney injury) (HCC)   Elevated lactic acid level   Protein-calorie malnutrition, severe (HCC)   GI bleed   Sepsis due to pneumonia ([place])   HCAP (healthcare-associated pneumonia)    Discharge Condition: stable   Diet recommendation: as tolerated   History of present illness:   Per brief narrative by [facility] 02/11/2016 "65 year old female with a past medical history of high-grade endometrial carcinoma, 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 She went on to receive 6 cycles of adjuvant chemotherapy with Paclitaxel and carboplatin from 02/16/2015 through 06/16/2015. Vaginal brachytherapy completed on 09/12/2015. Unfortunately she has had symptomatic recurrence with malignant ascites 6 months after completion of chemotherapy. A CT scan from September 2017 showed widespread omental tumor with worsening ascites and bladder invasion. She was recently hospitalized from 01/11/2016 through 01/30/2016 presented with sepsis, pneumonia, respiratory failure requiring intubation. That hospitalization was complicated by acute renal failure requiring CRRT. During that hospitalization she was seen by palliative care. She was discharged to SNF for  rehabilitation. She was readmitted to the medicine service on 02/07/2016 presenting with hemoglobin of 6.0 having white count of 23,400. Chest x-ray on admission showing bibasilar opacities consistent with pneumonia. Lactic acid elevated at 2.5. Having sepsis criteria she was started on broad-spectrum IV antibiotic therapy and placed on sepsis protocol. Additionally she was typed and crossed and transfused with packed red blood cells. Repeat lab work showing stable hemoglobin. Renal function showing gradual improvement as well. She remained dyspneic, however, repeat chest x-ray on 02/10/2016 showed interval development of bilateral lower lobe opacities radiology for this could be combination of infection, edema, atelectasis and pleural effusions. She was given 40 mg of IV Lasix.   Hospital Course:   Assessment/Plan:    Principal Problem: Sepsis due to bilateral pneumonia / postobstructive pneumonia - Repeat chest x-ray 3/13 showed no significant changes in bilateral pleural effusions and bibasilar atelectasis or consolidation. She could have possibly had postobstructive pneumonia in the setting of bilateral pleural effusions.  - Stop vanco and cefepime today and use Levaquin for 7 days on discharge  - Ultrasound-guided thoracentesis done 02/12/2016 with 700 mL fluid drained, reactive cells  - Blood cultures so far are negative - Of note, urine culture with VRE but did not require treatment per Dr. Johnnye Sima of ID, there was no official consultation phone call only 3/12  Active Problems: Antineoplastic induced anemia  - Patient has received 2 units of PRBC transfusion during this hospital stay - Hemoglobin stable  - Continue iron supplementation  Hypokalemia / hypomagnesemia - Probably related to Lasix given 02/11/2016 - Both supplemented yesterday  - Magnesium WNL this am, will give potassium 40 mg prior to discharge since potassium 3.4 this am  AKI (acute kidney injury) ([place]) - Required  CRRT on a recent hospitalization - Creatinine was 2.3 on the admission and is trending down to 1.22 -  Lasix given 02/11/2016 for pleural effusions  Endometrial cancer [facility]) - Management per oncology.  - Declined palliative care involvement at this time  Malignant ascites - S/p paracentesis 3/7, 2.8 L fluid removed  Essential hypertension - Systolic blood pressure was in 170s range 3/13 and 3/14 so stopped midodrine  Non-insulin dependent type 2 diabetes mellitus without complications without long-term insulin use (HCC) -Continue glipizide on discharge   Protein-calorie malnutrition, severe ([place]) - In the setting of chronic illness - Diet as tolerated   DVT Prophylaxis  - Lovenox subQ in hospital    Code Status: Full.  Family Communication: plan of care discussed with the patient and her daughter. I spoke with her daughter over the phone daily   IV access:  Peripheral IV  Procedures and diagnostic studies:   Dg Chest 1 View 02/12/2016 Post right thoracentesis with improved right effusion. Mild residual opacification in the right base likely small amount of pleural fluid with atelectasis. No pneumothorax. Stable left base opacification likely small effusion with atelectasis.   US Thoracentesis Asp Pleural Space W/img Guide 02/12/2016 Successful ultrasound guided diagnostic and therapeutic right thoracentesis yielding 700 cc of pleural fluid.   Dg Chest 2 View 02/12/2016 No significant change in bilateral pleural effusions and bibasilar atelectasis or consolidation. Electronically Signed By: Earle Gell M.D. On: 02/12/2016 08:41   Dg Chest 2 View 02/10/2016 Interval development of bilateral lower lung opacities, potentially combination of infection, edema, atelectasis, and bilateral pleural effusions. Signed, Dulcy Fanny. Earleen Newport, DO [name] Electronically Signed By: Corrie Mckusick D.O. On: 02/10/2016  10:37   Dg Chest 2 View 02/07/2016 Bibasilar opacities concerning for pneumonia. Small effusions. No real change since prior study. Electronically Signed By: Rolm Baptise M.D. On: 02/07/2016 14:24   US Paracentesis 02/06/2016 Successful ultrasound-guided therapeutic paracentesis yielding 2.8 liters of peritoneal fluid. Read by: Rowe Robert, PA-C Electronically Signed By: Sandi Mariscal M.D. On: 02/06/2016 12:21   IAnti-Infectives:   Cefepime and vancomycin through 02/15/2016  Signed:  Leisa Lenz, MD  [facility] 02/15/2016, 10:13 AM  Pager #: [phone]  Time spent in minutes: more than 30 minutes   Discharge Exam: Filed Vitals:   02/14/16 2148 02/15/16 0645  BP: 155/64 151/70  Pulse: 99 79  Temp: 99.3 F (37.4 C) 98.2 F (36.8 C)  Resp: 20 18   Filed Vitals:   02/14/16 2134 02/14/16 2148 02/15/16 0645 02/15/16 0822  BP:  155/64 151/70   Pulse:  99 79   Temp:  99.3 F (37.4 C) 98.2 F (36.8 C)   TempSrc:  Oral Oral   Resp:  20 18   Height:      Weight:      SpO2: 96% 95% 92% 93%    General: Pt is alert, follows commands appropriately, not in acute distress Cardiovascular: Regular rate and rhythm, S1/S2 + Respiratory: no wheezing, no crackles, no rhonchi Abdominal: Soft, non tender, non distended, bowel sounds +, no guarding Extremities: no edema, no cyanosis, pulses palpable bilaterally DP and PT Neuro: Grossly nonfocal  Discharge Instructions  Discharge Instructions    Call MD for:  difficulty breathing, headache or visual disturbances    Complete by:  As directed      Call MD for:  extreme fatigue    Complete by:  As directed      Call MD for:  persistant dizziness or light-headedness    Complete by:  As directed      Call MD for:  persistant nausea  and vomiting    Complete by:  As directed      Call MD for:  severe uncontrolled pain    Complete by:  As directed      Diet - low sodium heart healthy    Complete by:  As directed       Discharge instructions    Complete by:  As directed   Midodrine on hold since blood pressure stable. Use only if low blood pressure, less than 90/60     Increase activity slowly    Complete by:  As directed             Medication List    STOP taking these medications        cefTRIAXone 1 g injection  Commonly known as:  ROCEPHIN     HUMALOG KWIKPEN 100 UNIT/ML KiwkPen  Generic drug:  insulin lispro     midodrine 10 MG tablet  Commonly known as:  PROAMATINE     moxifloxacin 400 MG tablet  Commonly known as:  AVELOX     polyethylene glycol packet  Commonly known as:  MIRALAX / GLYCOLAX      TAKE these medications        acetaminophen 325 MG tablet  Commonly known as:  TYLENOL  Take 650 mg by mouth every 6 (six) hours as needed for mild pain, moderate pain or headache. Reported on 01/08/2016     albuterol (2.5 MG/3ML) 0.083% nebulizer solution  Commonly known as:  PROVENTIL  Take 2.5 mg by nebulization every 6 (six) hours as needed for wheezing or shortness of breath.     ferrous fumarate 325 (106 Fe) MG Tabs tablet  Commonly known as:  HEMOCYTE - 106 mg FE  Take 1 tablet daily  on an empty stomach with OJ or vitamin C tablet     glipiZIDE 5 MG 24 hr tablet  Commonly known as:  GLUCOTROL XL  Take 5 mg by mouth 3 (three) times daily.     guaiFENesin-dextromethorphan 100-10 MG/5ML syrup  Commonly known as:  ROBITUSSIN DM  Take 5 mLs by mouth every 4 (four) hours as needed for cough.     latanoprost 0.005 % ophthalmic solution  Commonly known as:  XALATAN  Place 1 drop into both eyes at bedtime.     levofloxacin 500 MG tablet  Commonly known as:  LEVAQUIN  Take 1 tablet (500 mg total) by mouth daily.     lidocaine-prilocaine cream  Commonly known as:  EMLA  Apply to Porta-Cath 1-2 hrs prior to access as directed.     LORazepam 0.5 MG tablet  Commonly known as:  ATIVAN  Place 1 tablet under tongue or swallow every 8 hours as needed for nausea. Will make drowsy.      nystatin 100000 UNIT/ML suspension  Commonly known as:  MYCOSTATIN  Take 5 mLs (500,000 Units total) by mouth 4 (four) times daily.     ondansetron 8 MG tablet  Commonly known as:  ZOFRAN  Take 1 tablet by mouth every 8 hours as needed for nausea. Will not make drowsy.     oxyCODONE 5 MG immediate release tablet  Commonly known as:  ROXICODONE  Take 1 tablet every 4-6 hours as needed for pain.     pantoprazole 40 MG tablet  Commonly known as:  PROTONIX  Take 1 tablet (40 mg total) by mouth daily.     pravastatin 20 MG tablet  Commonly known as:  PRAVACHOL  Take 20 mg  by mouth daily.     RA NUTRITIONAL SUPPLEMENT PO  Take 1 Units by mouth 2 (two) times daily.     feeding supplement (GLUCERNA SHAKE) Liqd  Take 237 mLs by mouth 3 (three) times daily between meals.     timolol 0.5 % ophthalmic solution  Commonly known as:  BETIMOL  Place 1 drop into both eyes 2 (two) times daily.           Follow-up Information    Follow up with Coles,DHAVAL, MD. Schedule an appointment as soon as possible for a visit in 1 week.   Specialty:  Endocrinology   Why:  Follow up appt after recent hospitalization   Contact information:   [id number] [place] [address] [phone]        The results of significant diagnostics from this hospitalization (including imaging, microbiology, ancillary and laboratory) are listed below for reference.    Significant Diagnostic Studies: Dg Chest 1 View  02/12/2016  CLINICAL DATA:  Post right thoracentesis. EXAM: CHEST 1 VIEW COMPARISON:  02/12/2016 at 7:56 a.m. FINDINGS: Right IJ Port-A-Cath unchanged. Lungs are hypoinflated with interval improvement in right-sided effusion with mild residual right base opacification likely combination of minimal pleural fluid with atelectasis. No evidence of right-sided pneumothorax. Mild stable left base opacification likely layering small left effusion with atelectasis. Mild stable  cardiomegaly. Remainder of the exam is unchanged. IMPRESSION: Post right thoracentesis with improved right effusion. Mild residual opacification in the right base likely small amount of pleural fluid with atelectasis. No pneumothorax. Stable left base opacification likely small effusion with atelectasis. Electronically Signed   By: Marin Olp M.D.   On: 02/12/2016 14:36   Dg Chest 2 View  02/12/2016  CLINICAL DATA:  Shortness breath. Pneumonia. Endometrial carcinoma. EXAM: CHEST  2 VIEW COMPARISON:  02/10/2016 FINDINGS: Moderate bilateral pleural effusions are again seen with associated bibasilar atelectasis or consolidation. This shows no significant interval change. Power port remains in place. Heart size is stable. No pneumothorax visualized. IMPRESSION: No significant change in bilateral pleural effusions and bibasilar atelectasis or consolidation. Electronically Signed   By: Earle Gell M.D.   On: 02/12/2016 08:41   Dg Chest 2 View  02/10/2016  CLINICAL DATA:  65 year old female with a history of shortness of breath, pneumonia. EXAM: CHEST - 2 VIEW COMPARISON:  02/07/2016, 01/26/2016 FINDINGS: Cardiomediastinal silhouette likely unchanged though partially obscured by overlying lung and pleural disease. Low lung volumes bilaterally. Mixed interstitial airspace opacities at the bilateral lower lungs, obscuring the hemidiaphragm on the left and right and the heart borders. Lateral view demonstrates opacification of the costophrenic sulcus. Fullness in the central vasculature. Right IJ port catheter. No displaced fracture. Unremarkable appearance of the upper abdomen. IMPRESSION: Interval development of bilateral lower lung opacities, potentially combination of infection, edema, atelectasis, and bilateral pleural effusions. Signed, Dulcy Fanny. Earleen Newport, DO [facility] Electronically Signed   By: Corrie Mckusick D.O.   On: 02/10/2016 10:37   Dg Chest 2  View  02/07/2016  CLINICAL DATA:  Pneumonia, undergoing current treatment. EXAM: CHEST  2 VIEW COMPARISON:  01/26/2016 FINDINGS: Bibasilar airspace opacities are similar to prior study may reflect pneumonia. Small bilateral effusions. Right Port-A-Cath is in place with the tip in the SVC. Interval removal of left internal jugular central line. Heart is normal size. IMPRESSION: Bibasilar opacities concerning for pneumonia. Small effusions. No real change since prior study. Electronically Signed   By: Rolm Baptise M.D.  On: 02/07/2016 14:24   US Abdomen Limited  01/16/2016  CLINICAL DATA:  Current history of metastatic endometrial cancer with peritoneal and omental metastatic disease. Abdominal distension. Evaluate for possible paracentesis. EXAM: LIMITED ABDOMEN ULTRASOUND FOR ASCITES TECHNIQUE: Limited ultrasound survey for ascites was performed in all four abdominal quadrants. COMPARISON:  CT abdomen and pelvis 01/11/2016. FINDINGS: A small amount of ascites is present in all 4 quadrants of the abdomen, though none of the pockets of fluid are large enough for paracentesis currently. IMPRESSION: Small amount of ascites. None of the pockets of fluid are large enough for paracentesis currently. Electronically Signed   By: Evangeline Dakin M.D.   On: 01/16/2016 16:00   US Paracentesis  02/06/2016  INDICATION: Patient with history of high-grade serous endometrial carcinoma, recurrent ascites. Request made for therapeutic paracentesis. EXAM: ULTRASOUND GUIDED THERAPEUTIC PARACENTESIS MEDICATIONS: None. COMPLICATIONS: None immediate. PROCEDURE: Informed written consent was obtained from the patient after a discussion of the risks, benefits and alternatives to treatment. A timeout was performed prior to the initiation of the procedure. Initial ultrasound scanning demonstrates a small to moderate amount of ascites within the left lower abdominal quadrant. The left lower abdomen was prepped and draped in the usual  sterile fashion. 1% lidocaine was used for local anesthesia. Following this, a Yueh catheter was introduced. An ultrasound image was saved for documentation purposes. The paracentesis was performed. The catheter was removed and a dressing was applied. The patient tolerated the procedure well without immediate post procedural complication. FINDINGS: A total of approximately 2.8 liters of yellow fluid was removed. IMPRESSION: Successful ultrasound-guided therapeutic paracentesis yielding 2.8 liters of peritoneal fluid. Read by: Rowe Robert, PA-C Electronically Signed   By: Sandi Mariscal M.D.   On: 02/06/2016 12:21   Dg Chest Port 1 View  01/26/2016  CLINICAL DATA:  Coughing with phlegm and mid chest pain for 3-4 days. EXAM: PORTABLE CHEST 1 VIEW COMPARISON:  01/13/2016 FINDINGS: 1614 hours. Endotracheal tube has been removed in the interval. The right Port-A-Cath tip remains positioned over the proximal mid SVC. The tip of the left IJ central line is positioned in the proximal SVC, just below the innominate vein confluence. The lungs are clear wiithout focal pneumonia, edema, pneumothorax or pleural effusion. The cardiopericardial silhouette is within normal limits for size. Question tiny left pleural effusion. The visualized bony structures of the thorax are intact. IMPRESSION: 1. Interval extubation. 2. Question tiny left pleural effusion. Electronically Signed   By: Misty Stanley M.D.   On: 01/26/2016 16:29   US Thoracentesis Asp Pleural Space W/img Guide  02/12/2016  INDICATION: Endometrial cancer, pneumonia, dyspnea, bilateral pleural effusions; request is made for diagnostic and therapeutic right thoracentesis. EXAM: ULTRASOUND GUIDED DIAGNOSTIC AND THERAPEUTIC RIGHT THORACENTESIS MEDICATIONS: None. COMPLICATIONS: None immediate. PROCEDURE: An ultrasound guided thoracentesis was thoroughly discussed with the patient and questions answered. The benefits, risks, alternatives and complications were also  discussed. The patient understands and wishes to proceed with the procedure. Written consent was obtained. Ultrasound was performed to localize and mark an adequate pocket of fluid in the right chest. The area was then prepped and draped in the normal sterile fashion. 1% Lidocaine was used for local anesthesia. Under ultrasound guidance a Safe-T-Centesis catheter was introduced. Thoracentesis was performed. The catheter was removed and a dressing applied. FINDINGS: A total of approximately 700 cc of slightly hazy, yellow fluid was removed. Samples were sent to the laboratory as requested by the clinical team. IMPRESSION: Successful ultrasound guided diagnostic and therapeutic right  thoracentesis yielding 700 cc of pleural fluid. Read by: Rowe Robert, PA-C Electronically Signed   By: Jerilynn Mages.  Shick M.D.   On: 02/12/2016 14:44    Microbiology: Recent Results (from the past 240 hour(s))  Blood Culture (routine x 2)     Status: None   Collection Time: 02/07/16  1:44 PM  Result Value Ref Range Status   Specimen Description BLOOD RIGHT ARM  Final   Special Requests BOTTLES DRAWN AEROBIC AND ANAEROBIC 5CC  Final   Culture   Final    NO GROWTH 5 DAYS Performed at [facility]    Report Status 02/12/2016 FINAL  Final  Urine culture     Status: None   Collection Time: 02/07/16  1:54 PM  Result Value Ref Range Status   Specimen Description URINE, CLEAN CATCH  Final   Special Requests NONE  Final   Culture   Final    50,000 COLONIES/mL ENTEROCOCCUS SPECIES SUSCEPTIBILITIES PERFORMED ON PREVIOUS CULTURE WITHIN THE LAST 5 DAYS. Performed at [facility]    Report Status 02/13/2016 FINAL  Final  MRSA PCR Screening     Status: None   Collection Time: 02/07/16  9:14 PM  Result Value Ref Range Status   MRSA by PCR NEGATIVE NEGATIVE Final    Comment:        The GeneXpert MRSA Assay (FDA approved for NASAL specimens only), is one component of a comprehensive MRSA colonization surveillance  program. It is not intended to diagnose MRSA infection nor to guide or monitor treatment for MRSA infections.   Blood Culture (routine x 2)     Status: None   Collection Time: 02/07/16  9:43 PM  Result Value Ref Range Status   Specimen Description BLOOD RIGHT ANTECUBITAL  Final   Special Requests IN PEDIATRIC BOTTLE 3ML  Final   Culture   Final    NO GROWTH 5 DAYS Performed at [facility]    Report Status 02/13/2016 FINAL  Final  Urine culture     Status: None   Collection Time: 02/07/16 10:40 PM  Result Value Ref Range Status   Specimen Description URINE, RANDOM  Final   Special Requests NONE  Final   Culture   Final    10,000 COLONIES/mL VANCOMYCIN RESISTANT ENTEROCOCCUS Performed at [facility]    Report Status 02/11/2016 FINAL  Final   Organism ID, Bacteria VANCOMYCIN RESISTANT ENTEROCOCCUS  Final      Susceptibility   Vancomycin resistant enterococcus - MIC*    AMPICILLIN >=32 RESISTANT Resistant     LEVOFLOXACIN >=8 RESISTANT Resistant     NITROFURANTOIN 256 RESISTANT Resistant     VANCOMYCIN >=32 RESISTANT Resistant     LINEZOLID 2 SENSITIVE Sensitive     * 10,000 COLONIES/mL VANCOMYCIN RESISTANT ENTEROCOCCUS  Gastrointestinal Panel by PCR , Stool     Status: None   Collection Time: 02/08/16 12:19 PM  Result Value Ref Range Status   Campylobacter species NOT DETECTED NOT DETECTED Final   Plesimonas shigelloides NOT DETECTED NOT DETECTED Final   Salmonella species NOT DETECTED NOT DETECTED Final   Yersinia enterocolitica NOT DETECTED NOT DETECTED Final   Vibrio species NOT DETECTED NOT DETECTED Final   Vibrio cholerae NOT DETECTED NOT DETECTED Final   Enteroaggregative E coli (EAEC) NOT DETECTED NOT DETECTED Final   Enteropathogenic E coli (EPEC) NOT DETECTED NOT DETECTED Final   Enterotoxigenic E coli (ETEC) NOT DETECTED NOT DETECTED Final   Shiga like toxin producing E coli (STEC)  NOT DETECTED NOT DETECTED Final   E. coli O157 NOT DETECTED NOT  DETECTED Final   Shigella/Enteroinvasive E coli (EIEC) NOT DETECTED NOT DETECTED Final   Cryptosporidium NOT DETECTED NOT DETECTED Final   Cyclospora cayetanensis NOT DETECTED NOT DETECTED Final   Entamoeba histolytica NOT DETECTED NOT DETECTED Final   Giardia lamblia NOT DETECTED NOT DETECTED Final   Adenovirus F40/41 NOT DETECTED NOT DETECTED Final   Astrovirus NOT DETECTED NOT DETECTED Final   Norovirus GI/GII NOT DETECTED NOT DETECTED Final   Rotavirus A NOT DETECTED NOT DETECTED Final   Sapovirus (I, II, IV, and V) NOT DETECTED NOT DETECTED Final  C difficile quick scan w PCR reflex     Status: None   Collection Time: 02/08/16 12:19 PM  Result Value Ref Range Status   C Diff antigen NEGATIVE NEGATIVE Final   C Diff toxin NEGATIVE NEGATIVE Final   C Diff interpretation Negative for toxigenic C. difficile  Final  Culture, sputum-assessment     Status: None   Collection Time: 02/11/16 11:47 PM  Result Value Ref Range Status   Specimen Description SPUTUM  Final   Special Requests NONE  Final   Sputum evaluation   Final    THIS SPECIMEN IS ACCEPTABLE. RESPIRATORY CULTURE REPORT TO FOLLOW.   Report Status 02/12/2016 FINAL  Final  Culture, respiratory (NON-Expectorated)     Status: None   Collection Time: 02/11/16 11:47 PM  Result Value Ref Range Status   Specimen Description SPUTUM  Final   Special Requests NONE  Final   Gram Stain   Final    FEW WBC PRESENT, PREDOMINANTLY PMN NO SQUAMOUS EPITHELIAL CELLS SEEN NO ORGANISMS SEEN THIS SPECIMEN IS ACCEPTABLE FOR SPUTUM CULTURE Performed at [facility]    Culture   Final    NORMAL OROPHARYNGEAL FLORA Performed at [facility]    Report Status 02/14/2016 FINAL  Final  Culture, body fluid-bottle     Status: None (Preliminary result)   Collection Time: 02/12/16  2:24 PM  Result Value Ref Range Status   Specimen Description FLUID PLEURAL RIGHT  Final   Special Requests BOTTLES DRAWN AEROBIC AND ANAEROBIC 10CC   Final   Culture   Final    NO GROWTH 1 DAY Performed at [facility]    Report Status PENDING  Incomplete  Gram stain     Status: None   Collection Time: 02/12/16  2:24 PM  Result Value Ref Range Status   Specimen Description FLUID RIGHT  Final   Special Requests NONE  Final   Gram Stain   Final    FEW WBC PRESENT,BOTH PMN AND MONONUCLEAR NO ORGANISMS SEEN Performed at [facility]    Report Status 02/13/2016 FINAL  Final     Labs: Basic Metabolic Panel:  Recent Labs Lab 02/10/16 0450 02/12/16 0348 02/13/16 0945 02/14/16 0420 02/15/16 0910  NA 141 142 139 143 144  K 3.5 2.8* 2.8* 3.3* 3.4*  CL 108 104 102 101 102  CO2 22 30 30  33* 33*  GLUCOSE 186* 159* 204* 139* 167*  BUN 17 17 15 14 14   CREATININE 1.67* 1.49* 1.42* 1.28* 1.22*  CALCIUM 7.9* 7.8* 7.6* 7.8* 8.0*  MG  --   --  1.0* 1.5* 1.7   Liver Function Tests:  Recent Labs Lab 02/12/16 0348  AST 19  ALT 26  ALKPHOS 125  BILITOT 0.5  PROT 5.3*  ALBUMIN 1.5*   No results for input(s): LIPASE, AMYLASE in the  last 168 hours.  Recent Labs Lab 02/11/16 1700  AMMONIA 23   CBC:  Recent Labs Lab 02/10/16 0450 02/12/16 0348 02/13/16 0945 02/14/16 0420  WBC 20.4* 17.9* 19.2* 17.5*  HGB 9.2* 9.0* 9.8* 9.4*  HCT 28.0* 28.9* 31.7* 29.9*  MCV 88.1 91.2 91.1 90.6  PLT 228 223 204 184   Cardiac Enzymes: No results for input(s): CKTOTAL, CKMB, CKMBINDEX, TROPONINI in the last 168 hours. BNP: BNP (last 3 results)  Recent Labs  01/11/16 1653  BNP 341.7*    ProBNP (last 3 results) No results for input(s): PROBNP in the last 8760 hours.  CBG:  Recent Labs Lab 02/14/16 0838 02/14/16 1208 02/14/16 1744 02/14/16 2145 02/15/16 0736  GLUCAP 131* 210* 212* 232* 141*

## 2016-02-15 NOTE — Discharge Instructions (Signed)
Pleural Effusion A pleural effusion is an abnormal buildup of fluid in the layers of tissue between your lungs and the inside of your chest (pleural space). These two layers of tissue that line both your lungs and the inside of your chest are called pleura. Usually, there is no air in the space between the pleura, only a thin layer of fluid. If left untreated, a large amount of fluid can build up and cause the lung to collapse. A pleural effusion is usually caused by another disease that requires treatment. The two main types of pleural effusion are:  Transudative pleural effusion. This happens when fluid leaks into the pleural space because of a low protein count in your blood or high blood pressure in your vessels. Heart failure often causes this.  Exudative infusion. This occurs when fluid collects in the pleural space from blocked blood vessels or lymph vessels. Some lung diseases, injuries, and cancers can cause this type of effusion. CAUSES Pleural effusion can be caused by:  Heart failure.  A blood clot in the lung (pulmonary embolism).  Pneumonia.  Cancer.  Liver failure (cirrhosis).  Kidney disease.  Complications from surgery, such as from open heart surgery. SIGNS AND SYMPTOMS In some cases, pleural effusion may cause no symptoms. Symptoms can include:  Shortness of breath, especially when lying down.  Chest pain, often worse when taking a deep breath.  Fever.  Dry cough that is lasting (chronic).  Hiccups.  Rapid breathing. An underlying condition that is causing the pleural effusion (such as heart failure, pneumonia, blood clots, tuberculosis, or cancer) may also cause additional symptoms. DIAGNOSIS Your health care provider may suspect pleural effusion based on your symptoms and medical history. Your health care provider will also do a physical exam and a chest X-ray. If the X-ray shows there is fluid in your chest, you may need to have this fluid removed using a  needle (thoracentesis) so it can be tested. You may also have:  Imaging studies of the chest, such as:  Ultrasound.  CT scan.  Blood tests for kidney and liver function. TREATMENT Treatment depends on the cause of the pleural effusion. Treatment may include:  Taking antibiotic medicines to clear up an infection that is causing the pleural effusion.  Placing a tube in the chest to drain the effusion (tube thoracostomy). This procedure is often used when there is an infection in the fluid.  Surgery to remove the fibrous outer layer of tissue from the pleural space (decortication).  Thoracentesis, which can improve cough and shortness of breath.  A procedure to put medicine into the chest cavity to seal the pleural space to prevent fluid buildup (pleurodesis).  Chemotherapy and radiation therapy. These may be required in the case of cancerous (malignant) pleural effusion. HOME CARE INSTRUCTIONS  Take medicines only as directed by your health care provider.  Keep track of how long you can gently exercise before you get short of breath. Try simply walking at first.  Do not use any tobacco products, including cigarettes, chewing tobacco, or electronic cigarettes. If you need help quitting, ask your health care provider.  Keep all follow-up visits as directed by your health care provider. This is important. SEEK MEDICAL CARE IF:  The amount of time that you are able to exercise decreases or does not improve with time.  You have pain or signs of infection at the puncture site if you had thoracentesis. Watch for:  Drainage.  Redness.  Swelling.  You have a fever.  SEEK IMMEDIATE MEDICAL CARE IF:  You are short of breath.  You develop chest pain.  You develop a new cough. MAKE SURE YOU:  Understand these instructions.  Will watch your condition.  Will get help right away if you are not doing well or get worse.   This information is not intended to replace advice  given to you by your health care provider. Make sure you discuss any questions you have with your health care provider.   Document Released: 11/18/2005 Document Revised: 12/09/2014 Document Reviewed: 04/13/2014 Elsevier Interactive Patient Education 2016 [employer]. Thoracentesis A thoracentesis is a procedure to remove fluid that has built up in the space between the linings of the chest wall and the lungs (pleural space). It is normal to have a small amount of fluid in the pleural space. Some medical conditions, such as heart failure, pneumonia, kidney problems, or cancer, can create too much fluid. This extra fluid is removed using a needle that is inserted through the skin and tissue and into the pleural space. A thoracentesis may be done to:  Understand why there is extra fluid in the pleural space and create a treatment plan that is right for you.  Help to get rid of shortness of breath, discomfort, or pain that is caused by the extra fluid. LET [facility] CARE PROVIDER KNOW ABOUT:  Any allergies you have.  All medicines you are taking, including vitamins, herbs, eye drops, creams, and over-the-counter medicines. This includes any use of steroids, either by mouth or in a cream.   Previous problems you or members of your family have had with the use of anesthetics.  Any blood disorders you have, including any history of blood clots.  Previous surgeries you have had.  Medical conditions you have, including:  The possibility of pregnancy, if this applies.  Have a frequent cough or coughing episodes. RISKS AND COMPLICATIONS Generally, this is a safe procedure. However, problems may occur, including:  Infection.  Injury to the lung.  Lung collapse.  Bleeding. BEFORE THE PROCEDURE  You may have a chest X-ray or another imaging test, such as a CT scan or ultrasound, to determine the location and amount of fluid in your pleural space.  Ask your health care provider  about:  Changing or stopping your regular medicines. This is especially important if you are taking diabetes medicines or blood thinners.  Taking medicines such as aspirin and ibuprofen. These medicines can thin your blood. Do not take these medicines before your procedure if your health care provider instructs you not to.  Taking a cough suppressant if you have a frequent cough or coughing episodes.  Plan to have someone take you home after the procedure. PROCEDURE  You will be asked to sit upright and lean slightly forward for the procedure.  An area of your back will be cleaned with a germ-killing solution (antiseptic).  You will be given a medicine that numbs the area (local anesthetic).  A needle will be inserted between your ribs and into the pleural space. You may feel pressure or slight pain as the needle is positioned into the pleural space.  Fluid will be removed from the pleural space through the needle. You may feel pressure as the fluid is removed.  The needle will be taken out after the excess fluid has been removed. A sample of the fluid may be sent to be examined.  The needle insertion site (puncture site) will be covered with a bandage (dressing). The procedure  may vary among health care providers and hospitals. AFTER THE PROCEDURE  A chest X-ray may be done to check the amount of fluid that remains in your pleural space.  Your blood pressure, heart rate, breathing rate, and blood oxygen level will be monitored often until the medicines you were given have worn off.  It is your responsibility to obtain your test results. Ask the lab or department performing the test when and how you will get your results. Talk with your health care provider if you have any questions about your results.   This information is not intended to replace advice given to you by your health care provider. Make sure you discuss any questions you have with your health care provider.    Document Released: 06/03/2005 Document Revised: 12/09/2014 Document Reviewed: 08/30/2014 Elsevier Interactive Patient Education [employer].

## 2016-02-15 NOTE — Progress Notes (Signed)
   02/15/16 1245  What Happened  Was fall witnessed? Yes  Who witnessed fall? Bradey Luzier RN and [employer] NT  Patients activity before fall ambulating-assisted  Point of contact other (comment) (knees)  Was patient injured? No  Follow Up  MD notified YES  Time MD notified 17  Family notified Yes-comment (daughter at bedside)  Time family notified 1244  Simple treatment Other (comment) (no treatment needed)  Adult Fall Risk Assessment  Risk Factor Category (scoring not indicated) Fall has occurred during this admission (document High fall risk)  Patient's Fall Risk High Fall Risk (>13 points)  Adult Fall Risk Interventions  Required Bundle Interventions *See Row Information* High fall risk - low, moderate, and high requirements implemented  Additional Interventions Bed alarm not indicated with the bundle;Fall risk signage;Individualized elimination schedule;Room near nurses station;Secure all tubes/drains  Fall with Injury Screening  Risk For Fall Injury- See Row Information  Nurse judgement  Intervention(s) for 2 or more risk criteria identified Gait Belt  Pain Assessment  Pain Assessment No/denies pain  Pain Score 0  PCA/Epidural/Spinal Assessment  Respiratory Pattern Regular;Unlabored  Neurological  Neuro (WDL) WDL  Level of Consciousness Alert  Orientation Level Oriented X4  Cognition Appropriate at baseline;Appropriate attention/concentration;Appropriate judgement;Appropriate safety awareness;Appropriate for developmental age;Follows commands  Speech Clear  Musculoskeletal  Musculoskeletal (WDL) X  Assistive Device Standard walker  Generalized Weakness Yes  Weight Bearing Restrictions No  Ambulating patient in hallway with 2 assist and walker.  During ambulation pt knees "buckled" and pt fell to floor landing on knees. RN attempted to to hold pt up and decrease impact. Pt held up by RN and NT, chair placed under patient and patient then placed back into bed. Pt  daughter at bedside, MD paged, orders received. Kihanna Kamiya A

## 2016-02-18 ENCOUNTER — Other Ambulatory Visit: Payer: Self-pay | Admitting: Oncology

## 2016-02-18 DIAGNOSIS — C541 Malignant neoplasm of endometrium: Secondary | ICD-10-CM

## 2016-02-18 DIAGNOSIS — R180 Malignant ascites: Secondary | ICD-10-CM

## 2016-02-18 LAB — CULTURE, BODY FLUID W GRAM STAIN -BOTTLE

## 2016-02-18 LAB — CULTURE, BODY FLUID-BOTTLE: CULTURE: NO GROWTH

## 2016-02-19 ENCOUNTER — Encounter: Payer: Self-pay | Admitting: Oncology

## 2016-02-19 ENCOUNTER — Other Ambulatory Visit (HOSPITAL_BASED_OUTPATIENT_CLINIC_OR_DEPARTMENT_OTHER): Payer: BLUE CROSS/BLUE SHIELD

## 2016-02-19 ENCOUNTER — Ambulatory Visit: Payer: BLUE CROSS/BLUE SHIELD

## 2016-02-19 ENCOUNTER — Ambulatory Visit (HOSPITAL_BASED_OUTPATIENT_CLINIC_OR_DEPARTMENT_OTHER): Payer: BLUE CROSS/BLUE SHIELD | Admitting: Oncology

## 2016-02-19 VITALS — BP 133/71 | HR 101 | Temp 98.4°F | Resp 18 | Ht 61.0 in | Wt 127.1 lb

## 2016-02-19 DIAGNOSIS — E119 Type 2 diabetes mellitus without complications: Secondary | ICD-10-CM

## 2016-02-19 DIAGNOSIS — D6481 Anemia due to antineoplastic chemotherapy: Secondary | ICD-10-CM

## 2016-02-19 DIAGNOSIS — D649 Anemia, unspecified: Secondary | ICD-10-CM

## 2016-02-19 DIAGNOSIS — R180 Malignant ascites: Secondary | ICD-10-CM

## 2016-02-19 DIAGNOSIS — Z95828 Presence of other vascular implants and grafts: Secondary | ICD-10-CM

## 2016-02-19 DIAGNOSIS — I959 Hypotension, unspecified: Secondary | ICD-10-CM

## 2016-02-19 DIAGNOSIS — N179 Acute kidney failure, unspecified: Secondary | ICD-10-CM | POA: Diagnosis not present

## 2016-02-19 DIAGNOSIS — E43 Unspecified severe protein-calorie malnutrition: Secondary | ICD-10-CM

## 2016-02-19 DIAGNOSIS — C541 Malignant neoplasm of endometrium: Secondary | ICD-10-CM

## 2016-02-19 DIAGNOSIS — K219 Gastro-esophageal reflux disease without esophagitis: Secondary | ICD-10-CM

## 2016-02-19 DIAGNOSIS — E46 Unspecified protein-calorie malnutrition: Secondary | ICD-10-CM

## 2016-02-19 DIAGNOSIS — N289 Disorder of kidney and ureter, unspecified: Secondary | ICD-10-CM

## 2016-02-19 LAB — CBC WITH DIFFERENTIAL/PLATELET
BASO%: 0.4 % (ref 0.0–2.0)
BASOS ABS: 0.1 10*3/uL (ref 0.0–0.1)
EOS%: 2.8 % (ref 0.0–7.0)
Eosinophils Absolute: 0.5 10*3/uL (ref 0.0–0.5)
HCT: 27.7 % — ABNORMAL LOW (ref 34.8–46.6)
HEMOGLOBIN: 8.6 g/dL — AB (ref 11.6–15.9)
LYMPH%: 7.1 % — ABNORMAL LOW (ref 14.0–49.7)
MCH: 27 pg (ref 25.1–34.0)
MCHC: 31.1 g/dL — AB (ref 31.5–36.0)
MCV: 86.7 fL (ref 79.5–101.0)
MONO#: 1.4 10*3/uL — ABNORMAL HIGH (ref 0.1–0.9)
MONO%: 7.6 % (ref 0.0–14.0)
NEUT#: 15.6 10*3/uL — ABNORMAL HIGH (ref 1.5–6.5)
NEUT%: 82.1 % — ABNORMAL HIGH (ref 38.4–76.8)
Platelets: 139 10*3/uL — ABNORMAL LOW (ref 145–400)
RBC: 3.2 10*6/uL — ABNORMAL LOW (ref 3.70–5.45)
RDW: 16 % — AB (ref 11.2–14.5)
WBC: 19 10*3/uL — ABNORMAL HIGH (ref 3.9–10.3)
lymph#: 1.4 10*3/uL (ref 0.9–3.3)

## 2016-02-19 LAB — COMPREHENSIVE METABOLIC PANEL
ALBUMIN: 1.6 g/dL — AB (ref 3.5–5.0)
ALT: 37 U/L (ref 0–55)
AST: 30 U/L (ref 5–34)
Alkaline Phosphatase: 143 U/L (ref 40–150)
Anion Gap: 9 mEq/L (ref 3–11)
BUN: 14.7 mg/dL (ref 7.0–26.0)
CHLORIDE: 102 meq/L (ref 98–109)
CO2: 29 mEq/L (ref 22–29)
Calcium: 7.9 mg/dL — ABNORMAL LOW (ref 8.4–10.4)
Creatinine: 1.2 mg/dL — ABNORMAL HIGH (ref 0.6–1.1)
EGFR: 50 mL/min/{1.73_m2} — ABNORMAL LOW (ref >90)
GLUCOSE: 146 mg/dL — AB (ref 70–140)
POTASSIUM: 4 meq/L (ref 3.5–5.1)
SODIUM: 139 meq/L (ref 136–145)
Total Bilirubin: 0.38 mg/dL (ref 0.20–1.20)
Total Protein: 6.1 g/dL — ABNORMAL LOW (ref 6.4–8.3)

## 2016-02-19 MED ORDER — SODIUM CHLORIDE 0.9% FLUSH
10.0000 mL | INTRAVENOUS | Status: DC | PRN
  Administered 2016-02-19: 10 mL via INTRAVENOUS
  Filled 2016-02-19: qty 10

## 2016-02-19 MED ORDER — OXYCODONE HCL 5 MG PO TABS: ORAL_TABLET | ORAL | Status: DC

## 2016-02-19 MED ORDER — PANTOPRAZOLE SODIUM 40 MG PO TBEC: 40.0000 mg | DELAYED_RELEASE_TABLET | Freq: Every day | ORAL | Status: DC

## 2016-02-19 MED ORDER — HEPARIN SOD (PORK) LOCK FLUSH 100 UNIT/ML IV SOLN
500.0000 [IU] | Freq: Once | INTRAVENOUS | Status: AC
  Administered 2016-02-19: 500 [IU] via INTRAVENOUS
  Filled 2016-02-19: qty 5

## 2016-02-19 NOTE — Patient Instructions (Signed)

## 2016-02-19 NOTE — Progress Notes (Signed)
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 She has had multiple lengthy hospital admissions since recurrent disease apparent in 12-2015, most recently 3-8 thru 02-15-16. Situation has been complicated by malignant ascites, acute renal failure, short term ventilator support, multifactorial anemia, recent pneumonia, poor nutritional status and poor PS.  Only chemotherapy to this point for the recurrent disease was single cycle of adriamycin 01-10-16.  Last paracentesis was 02-06-16 for 2..8 liters (malignant ascites) She had right thoracentesis 02-12-16 for 700cc, cytology with reactive mesothelial cells ([id number]).  She was transfused 2 units of PRBCs on 02-08-16 for hemoglobin 6.0 Creatinine was down to 1.22 during most recent admission. Urine culture in hospital VRE, did not require treatment per ID discussion. Patient has not wanted hospice involvement as yet. Family is caring for her at home since most recent admission, with [facility] nursing and [facility] PT/OT to begin this week. They have equipment including hospital bed, WC, BSC.  Patient continues oral antibiotics for another 3 days. She has had no fever. O2 saturations were too high for home O2 at time of DC. She is eating a little regular food, mostly beans and lentils, has not been using glucerna. She has not needed the prn midodrine for hypotension. She is not as uncomfortable from ascites yet. Bowels are moving, not diarrhea. She is voiding. No bleeding. Some cough, not productive. She is using respiratory nebs bid, helpful.   PAC in Flu vaccine 01-08-16  ONCOLOGIC HISTORY  Patient presented to Dr [name] 01-03-15 with vaginal spotting x 1 year. Pelvic US showed 33 mm  endometrial stripe with ovaries normal, and endometrial biopsy 01-03-15 grade 3 endometrial adenocarcinoma. CT CAP 01-13-15 had negative chest, no adenopathy, mild fatty liver, markedly thickened endometrium and no evidence of metastatic disease outside of uterus. CA 125 preoperatively on 01-13-15 was 22.9. Surgery by Dr [name] 01-19-15 was robotic hysterectomy, BSO and resection of nodule at sigmoid serosa. At surgery there was milial tumor studding with 5-10 mm implants on bladder peritoneum and miliary disease on pelvic sidewalls, without upper abdominal disease evident, and omentum appeared normal. 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 Recommendation for 6 cycles of taxol carboplatin, then consideration of external beam RT + vaginal brachytherapy if CR. First carboplatin taxol was given 02-16-2015; she was neutropenic with ANC 0.7 by day 15 cycle 1. She was transfused 2 units PRBCs 5-19/5-20-16 for Hgb 7.8. Cycles 4 and 5 were both delayed due to thrombocytopenia. She completed chemotherapy 06-16-15 and vaginal brachytherapy30 gray in 5 fractions as of 09/14/2015. She did well thru end of 11-2015, including at gyn oncology exam 11-20-15 with CA 125 of 13 then. She presented to ED 12-29-15 with ~ 2 weeks progressive abdominal distension and pain, with large volume malignant ascites and large recurrent tumor burden. Cytology [id number] from ascites malignant cells consistent with gyn adenocarcinoma; CA 125 was up to 326 on 12-29-15. She was hospitalized thru 01-07-16, with paracentesis for 4 liters bloody fluid on 1-28, for 3.5 liters on 01-02-16 and 4 liters on 01-09-16. Chemo was held 01-08-16 due to concerning exam, with bilateral LE venous dopplers no evidence of DVT and CT confirming large  ascites (not appreciated on [place] 01-06-16) and bulky tumor without apparent bleed.  Patient and family were counseled on very aggressive and advanced recurrent malignancy, understood that any treatment would be in attempt to control disease but not potential cure. Discussion then about life support and advance directives, specifically that such measures would not improve the underlying malignancy; patient and family wanted to consider that information. She requested attempt at treatment, had first adriamycin on 01-10-16 She was receiving PRBCs on 01-11-16 when developed respiratory distress, intubated, supported in ICU with continuous dialysis and pressors. She was DC to SNF on 01-30-16. She had outpatient paracentesis for 2.8 liters on 02-06-16, then admitted on 02-07-16 with pneumonia and anemia.   Objective:  Vital signs in last 24 hours:  BP 133/71 mmHg  Pulse 101  Temp(Src) 98.4 F (36.9 C) (Oral)  Resp 18  Ht 5' 1" (1.549 m)  Wt 127 lb 1.6 oz (57.652 kg)  BMI 24.03 kg/m2  SpO2 100% Weight was 128 in Dec 2016, tho note ascites and LE edema now. Alert, oriented and appropriate, looks generally ill and weak, with abdominal distension. Respirations not labored RA. In [place].   HEENT:PERRL, sclerae not icteric. Oral mucosa moist without lesions, posterior pharynx clear.  No JVD.  Lymphatics:no cervical,supraclavicular adenopathy Resp: no wheezes or rales, decreased BS right lower posterior Cardio: regular rate and rhythm. No gallop. GI: soft, nontender, distended but still soft including epigastrium and mid abdomen. A few bowel sounds. Surgical incision not remarkable. Musculoskeletal/ Extremities: LE 1-2+ swelling without cords, tenderness Neuro: speech fluent, moves all extremities Skin without rash, ecchymosis, petechiae Portacath-without erythema or tenderness  Lab Results:  Results for orders placed or performed in visit on 02/19/16  CBC with Differential  Result Value Ref Range   WBC 19.0 (H) 3.9 - 10.3 10e3/uL   NEUT# 15.6 (H) 1.5 - 6.5 10e3/uL   HGB 8.6 (L) 11.6 -  15.9 g/dL   HCT 27.7 (L) 34.8 - 46.6 %   Platelets 139 (L) 145 - 400 10e3/uL   MCV 86.7 79.5 - 101.0 fL   MCH 27.0 25.1 - 34.0 pg   MCHC 31.1 (L) 31.5 - 36.0 g/dL   RBC 3.20 (L) 3.70 - 5.45 10e6/uL   RDW 16.0 (H) 11.2 - 14.5 %   lymph# 1.4 0.9 - 3.3 10e3/uL   MONO# 1.4 (H) 0.1 - 0.9 10e3/uL   Eosinophils Absolute 0.5 0.0 - 0.5 10e3/uL   Basophils Absolute 0.1 0.0 - 0.1 10e3/uL   NEUT% 82.1 (H) 38.4 - 76.8 %   LYMPH% 7.1 (L) 14.0 - 49.7 %   MONO% 7.6 0.0 - 14.0 %   EOS% 2.8 0.0 - 7.0 %   BASO% 0.4 0.0 - 2.0 %  Comprehensive metabolic panel  Result Value Ref Range   Sodium 139 136 - 145 mEq/L   Potassium 4.0 3.5 - 5.1 mEq/L   Chloride 102 98 - 109 mEq/L   CO2 29 22 - 29 mEq/L   Glucose 146 (H) 70 - 140 mg/dl   BUN 14.7 7.0 - 26.0 mg/dL   Creatinine 1.2 (H) 0.6 - 1.1 mg/dL   Total Bilirubin 0.38 0.20 - 1.20 mg/dL   Alkaline Phosphatase 143 40 - 150 U/L   AST 30 5 - 34 U/L   ALT 37 0 - 55 U/L   Total Protein 6.1 (L) 6.4 - 8.3 g/dL   Albumin 1.6 (L) 3.5 - 5.0 g/dL   Calcium 7.9 (L) 8.4 - 10.4 mg/dL   Anion  Gap 9 3 - 11 mEq/L   EGFR 50 (L) >90 ml/min/1.73 m2  CA 125  Result Value Ref Range   Cancer Antigen (CA) 125 125.2 (H) 0.0 - 38.1 U/mL  CA 125 (Parallel Testing)  Result Value Ref Range   CA 125 195 (H) <35 U/mL   CA 125 had been 13 on 11-20-15, 326 on 12-29-15 and 273 on 01-22-16  Studies/Results:  No results found.  Medications: I have reviewed the patient's current medications.  DISCUSSION  Despite marked improvement in renal function, performance status is still too poor to attempt additional chemotherapy now. I have encouraged her to resume glucerna >=3 cans daily. She will finish antibiotics. Hopefully home PT will be beneficial.  Daughters have been more realistic about situation than patient, and I believe also than husband, however daughters have not wanted to go against patient's wishes or "discourage" her.  Code status in hospital after Feb intubation and  CRRT was no re-intubation.  I will see her again in ~ 10 days, with repeat labs. They will let us know prior if she needs paracenteses. NOTE multiple large volume paracenteses were major contributor to ARF, so better to remove limited fluid. If she does need another paracentesis, would try to place peritoneal catheter for home management.    Assessment/Plan:  1.recurrent high grade endometrial carcinoma presenting 12-2015 with large volume malignant ascites, with complicated course since then including acute respiratory decompensation and ARF in 01-2016 requiring ventilator support and CRRT initially. Situation not appropriate so far for additional chemo beyond first cycle adriamycin given 01-10-16, that in attempt to control disease.  Ascites is malignant from previous cytology evaluations.  If another paracentesis necessary for symptoms, would try to get peritoneal drain placed, to allow management with small volume paracenteses out of hospital. Note large volume paracenteses in Feb contributed to ARF then. Patient and husband have wanted to pursue treatment rather than comfort care only; daughters have been more open to comfort care. Code status Feb 2017 admission was limited code, no re-intubation.  2.pneumonia on antibiotics. Large right pleural effusion tapped 02-12-16, cytology negative 3.anemia: multifactorial including bloody ascites previously, blood draws, chemo, renal insufficiency. Note she is lifelong vegan, had been on B12 supplements at least prior to all of this acute illness. 4.Acute renal failure 01-2016: creatinine has improved further since last admission. Known to Dr Jonnie Finner in hospital 5. Diabetes followed by PCP 6.protein calorie malnutrition, general marked deconditioning: add supplements several times daily, home PT planned  7.PAC in, used during admissions  All questions answered and they know to call if other problems. Time spent 25 min including >50% counseling and  coordination of care.     LIVESAY,LENNIS P, MD   02/20/2016, 8:15 PM

## 2016-02-20 LAB — CANCER ANTIGEN 125 (PARALLEL TESTING): CA 125: 195 U/mL — AB (ref <35)

## 2016-02-20 LAB — CA 125: Cancer Antigen (CA) 125: 125.2 U/mL — ABNORMAL HIGH (ref 0.0–38.1)

## 2016-02-23 ENCOUNTER — Inpatient Hospital Stay (HOSPITAL_COMMUNITY): Payer: BLUE CROSS/BLUE SHIELD

## 2016-02-23 ENCOUNTER — Emergency Department (HOSPITAL_COMMUNITY): Payer: BLUE CROSS/BLUE SHIELD

## 2016-02-23 ENCOUNTER — Inpatient Hospital Stay (HOSPITAL_COMMUNITY)
Admission: EM | Admit: 2016-02-23 | Discharge: 2016-02-26 | DRG: 186 | Disposition: A | Discharge status: Home Health | Payer: BLUE CROSS/BLUE SHIELD | Attending: Internal Medicine | Admitting: Internal Medicine

## 2016-02-23 ENCOUNTER — Encounter (HOSPITAL_COMMUNITY): Payer: Self-pay

## 2016-02-23 DIAGNOSIS — D696 Thrombocytopenia, unspecified: Secondary | ICD-10-CM | POA: Diagnosis present

## 2016-02-23 DIAGNOSIS — N179 Acute kidney failure, unspecified: Secondary | ICD-10-CM | POA: Diagnosis present

## 2016-02-23 DIAGNOSIS — R0602 Shortness of breath: Secondary | ICD-10-CM | POA: Insufficient documentation

## 2016-02-23 DIAGNOSIS — I10 Essential (primary) hypertension: Secondary | ICD-10-CM | POA: Diagnosis present

## 2016-02-23 DIAGNOSIS — Z90710 Acquired absence of both cervix and uterus: Secondary | ICD-10-CM

## 2016-02-23 DIAGNOSIS — E162 Hypoglycemia, unspecified: Secondary | ICD-10-CM | POA: Diagnosis present

## 2016-02-23 DIAGNOSIS — Z7984 Long term (current) use of oral hypoglycemic drugs: Secondary | ICD-10-CM

## 2016-02-23 DIAGNOSIS — T451X5A Adverse effect of antineoplastic and immunosuppressive drugs, initial encounter: Secondary | ICD-10-CM | POA: Diagnosis present

## 2016-02-23 DIAGNOSIS — Z833 Family history of diabetes mellitus: Secondary | ICD-10-CM | POA: Diagnosis not present

## 2016-02-23 DIAGNOSIS — R627 Adult failure to thrive: Secondary | ICD-10-CM | POA: Diagnosis present

## 2016-02-23 DIAGNOSIS — D6481 Anemia due to antineoplastic chemotherapy: Secondary | ICD-10-CM | POA: Diagnosis not present

## 2016-02-23 DIAGNOSIS — D638 Anemia in other chronic diseases classified elsewhere: Secondary | ICD-10-CM | POA: Diagnosis present

## 2016-02-23 DIAGNOSIS — E11649 Type 2 diabetes mellitus with hypoglycemia without coma: Secondary | ICD-10-CM | POA: Diagnosis present

## 2016-02-23 DIAGNOSIS — E538 Deficiency of other specified B group vitamins: Secondary | ICD-10-CM | POA: Insufficient documentation

## 2016-02-23 DIAGNOSIS — R180 Malignant ascites: Secondary | ICD-10-CM | POA: Diagnosis present

## 2016-02-23 DIAGNOSIS — Z7189 Other specified counseling: Secondary | ICD-10-CM | POA: Diagnosis not present

## 2016-02-23 DIAGNOSIS — Z79899 Other long term (current) drug therapy: Secondary | ICD-10-CM

## 2016-02-23 DIAGNOSIS — E1165 Type 2 diabetes mellitus with hyperglycemia: Secondary | ICD-10-CM

## 2016-02-23 DIAGNOSIS — Z515 Encounter for palliative care: Secondary | ICD-10-CM | POA: Insufficient documentation

## 2016-02-23 DIAGNOSIS — H409 Unspecified glaucoma: Secondary | ICD-10-CM | POA: Diagnosis present

## 2016-02-23 DIAGNOSIS — R188 Other ascites: Secondary | ICD-10-CM | POA: Diagnosis not present

## 2016-02-23 DIAGNOSIS — R05 Cough: Secondary | ICD-10-CM | POA: Diagnosis not present

## 2016-02-23 DIAGNOSIS — D649 Anemia, unspecified: Secondary | ICD-10-CM | POA: Diagnosis not present

## 2016-02-23 DIAGNOSIS — C541 Malignant neoplasm of endometrium: Secondary | ICD-10-CM | POA: Diagnosis not present

## 2016-02-23 DIAGNOSIS — E43 Unspecified severe protein-calorie malnutrition: Secondary | ICD-10-CM

## 2016-02-23 DIAGNOSIS — J90 Pleural effusion, not elsewhere classified: Secondary | ICD-10-CM | POA: Diagnosis present

## 2016-02-23 DIAGNOSIS — Z9889 Other specified postprocedural states: Secondary | ICD-10-CM | POA: Insufficient documentation

## 2016-02-23 DIAGNOSIS — Z6825 Body mass index (BMI) 25.0-25.9, adult: Secondary | ICD-10-CM | POA: Diagnosis not present

## 2016-02-23 DIAGNOSIS — J948 Other specified pleural conditions: Secondary | ICD-10-CM | POA: Diagnosis not present

## 2016-02-23 LAB — CBC WITH DIFFERENTIAL/PLATELET
BASOS ABS: 0 10*3/uL (ref 0.0–0.1)
BASOS PCT: 0 %
BASOS PCT: 0 %
Basophils Absolute: 0 10*3/uL (ref 0.0–0.1)
EOS ABS: 0.4 10*3/uL (ref 0.0–0.7)
EOS ABS: 0.2 10*3/uL (ref 0.0–0.7)
EOS PCT: 2 %
EOS PCT: 1 %
HCT: 25.5 % — ABNORMAL LOW (ref 36.0–46.0)
HCT: 24.3 % — ABNORMAL LOW (ref 36.0–46.0)
HEMOGLOBIN: 8.3 g/dL — AB (ref 12.0–15.0)
Hemoglobin: 7.9 g/dL — ABNORMAL LOW (ref 12.0–15.0)
LYMPHS PCT: 5 %
Lymphocytes Relative: 8 %
Lymphs Abs: 1 10*3/uL (ref 0.7–4.0)
Lymphs Abs: 1.4 10*3/uL (ref 0.7–4.0)
MCH: 27.6 pg (ref 26.0–34.0)
MCH: 27.7 pg (ref 26.0–34.0)
MCHC: 32.5 g/dL (ref 30.0–36.0)
MCHC: 32.5 g/dL (ref 30.0–36.0)
MCV: 84.7 fL (ref 78.0–100.0)
MCV: 85.3 fL (ref 78.0–100.0)
MONO ABS: 1.9 10*3/uL — AB (ref 0.1–1.0)
MONO ABS: 1.5 10*3/uL — AB (ref 0.1–1.0)
MONOS PCT: 9 %
Monocytes Relative: 10 %
NEUTROS PCT: 83 %
Neutro Abs: 15.8 10*3/uL — ABNORMAL HIGH (ref 1.7–7.7)
Neutro Abs: 14.3 10*3/uL — ABNORMAL HIGH (ref 1.7–7.7)
Neutrophils Relative %: 82 %
PLATELETS: 153 10*3/uL (ref 150–400)
PLATELETS: 136 10*3/uL — AB (ref 150–400)
RBC: 3.01 MIL/uL — AB (ref 3.87–5.11)
RBC: 2.85 MIL/uL — ABNORMAL LOW (ref 3.87–5.11)
RDW: 15.9 % — ABNORMAL HIGH (ref 11.5–15.5)
RDW: 16 % — AB (ref 11.5–15.5)
WBC: 19.1 10*3/uL — AB (ref 4.0–10.5)
WBC: 17.4 10*3/uL — ABNORMAL HIGH (ref 4.0–10.5)

## 2016-02-23 LAB — GLUCOSE, CAPILLARY
GLUCOSE-CAPILLARY: 53 mg/dL — AB (ref 65–99)
GLUCOSE-CAPILLARY: 63 mg/dL — AB (ref 65–99)
GLUCOSE-CAPILLARY: 74 mg/dL (ref 65–99)
GLUCOSE-CAPILLARY: 88 mg/dL (ref 65–99)
Glucose-Capillary: 92 mg/dL (ref 65–99)
Glucose-Capillary: 76 mg/dL (ref 65–99)

## 2016-02-23 LAB — I-STAT CHEM 8, ED
BUN: 16 mg/dL (ref 6–20)
CHLORIDE: 101 mmol/L (ref 101–111)
Calcium, Ion: 1.03 mmol/L — ABNORMAL LOW (ref 1.13–1.30)
Creatinine, Ser: 1 mg/dL (ref 0.44–1.00)
Glucose, Bld: 55 mg/dL — ABNORMAL LOW (ref 65–99)
HCT: 25 % — ABNORMAL LOW (ref 36.0–46.0)
Hemoglobin: 8.5 g/dL — ABNORMAL LOW (ref 12.0–15.0)
POTASSIUM: 3.9 mmol/L (ref 3.5–5.1)
SODIUM: 137 mmol/L (ref 135–145)
TCO2: 26 mmol/L (ref 0–100)

## 2016-02-23 LAB — COMPREHENSIVE METABOLIC PANEL
ALBUMIN: 1.5 g/dL — AB (ref 3.5–5.0)
ALK PHOS: 116 U/L (ref 38–126)
ALT: 42 U/L (ref 14–54)
ANION GAP: 9 (ref 5–15)
AST: 56 U/L — ABNORMAL HIGH (ref 15–41)
BILIRUBIN TOTAL: 0.3 mg/dL (ref 0.3–1.2)
BUN: 14 mg/dL (ref 6–20)
CALCIUM: 7.3 mg/dL — AB (ref 8.9–10.3)
CO2: 22 mmol/L (ref 22–32)
CREATININE: 1.13 mg/dL — AB (ref 0.44–1.00)
Chloride: 102 mmol/L (ref 101–111)
GFR calc Af Amer: 58 mL/min — ABNORMAL LOW (ref >60)
GFR calc non Af Amer: 50 mL/min — ABNORMAL LOW (ref >60)
GLUCOSE: 201 mg/dL — AB (ref 65–99)
Potassium: 3.8 mmol/L (ref 3.5–5.1)
SODIUM: 133 mmol/L — AB (ref 135–145)
TOTAL PROTEIN: 5.2 g/dL — AB (ref 6.5–8.1)

## 2016-02-23 LAB — URINE MICROSCOPIC-ADD ON

## 2016-02-23 LAB — URINALYSIS, ROUTINE W REFLEX MICROSCOPIC
Bilirubin Urine: NEGATIVE
Glucose, UA: 250 mg/dL — AB
Ketones, ur: NEGATIVE mg/dL
NITRITE: NEGATIVE
PH: 6 (ref 5.0–8.0)
Protein, ur: 100 mg/dL — AB
SPECIFIC GRAVITY, URINE: 1.017 (ref 1.005–1.030)

## 2016-02-23 LAB — PROCALCITONIN: Procalcitonin: 0.26 ng/mL

## 2016-02-23 LAB — CBG MONITORING, ED
GLUCOSE-CAPILLARY: 50 mg/dL — AB (ref 65–99)
GLUCOSE-CAPILLARY: 95 mg/dL (ref 65–99)
GLUCOSE-CAPILLARY: 139 mg/dL — AB (ref 65–99)
GLUCOSE-CAPILLARY: 155 mg/dL — AB (ref 65–99)
Glucose-Capillary: 96 mg/dL (ref 65–99)
Glucose-Capillary: 204 mg/dL — ABNORMAL HIGH (ref 65–99)
Glucose-Capillary: 64 mg/dL — ABNORMAL LOW (ref 65–99)
Glucose-Capillary: 130 mg/dL — ABNORMAL HIGH (ref 65–99)
Glucose-Capillary: 65 mg/dL (ref 65–99)

## 2016-02-23 LAB — BODY FLUID CELL COUNT WITH DIFFERENTIAL
EOS FL: 0 %
LYMPHS FL: 63 %
Monocyte-Macrophage-Serous Fluid: 27 % — ABNORMAL LOW (ref 50–90)
NEUTROPHIL FLUID: 10 % (ref 0–25)
Total Nucleated Cell Count, Fluid: 223 cu mm (ref 0–1000)

## 2016-02-23 LAB — GRAM STAIN

## 2016-02-23 LAB — GLUCOSE, SEROUS FLUID: Glucose, Fluid: 128 mg/dL

## 2016-02-23 LAB — PROTEIN, BODY FLUID: Total protein, fluid: 3.1 g/dL

## 2016-02-23 LAB — LACTATE DEHYDROGENASE, PLEURAL OR PERITONEAL FLUID: LD FL: 177 U/L — AB (ref 3–23)

## 2016-02-23 MED ORDER — ACETAMINOPHEN 650 MG RE SUPP: 650.0000 mg | Freq: Four times a day (QID) | RECTAL | Status: DC | PRN

## 2016-02-23 MED ORDER — GLUCOSE-VITAMIN C 4-6 GM-MG PO CHEW
CHEWABLE_TABLET | ORAL | Status: AC
  Administered 2016-02-23: 17:00:00
  Filled 2016-02-23: qty 1

## 2016-02-23 MED ORDER — PIPERACILLIN-TAZOBACTAM 3.375 G IVPB
3.3750 g | Freq: Three times a day (TID) | INTRAVENOUS | Status: DC
  Administered 2016-02-23 – 2016-02-24 (×4): 3.375 g via INTRAVENOUS
  Filled 2016-02-23 (×4): qty 50

## 2016-02-23 MED ORDER — DEXTROSE 50 % IV SOLN
1.0000 | Freq: Once | INTRAVENOUS | Status: AC
  Administered 2016-02-23: 50 mL via INTRAVENOUS

## 2016-02-23 MED ORDER — ONDANSETRON HCL 4 MG/2ML IJ SOLN: 4.0000 mg | Freq: Four times a day (QID) | INTRAMUSCULAR | Status: DC | PRN

## 2016-02-23 MED ORDER — TIMOLOL MALEATE 0.5 % OP SOLN
1.0000 [drp] | Freq: Two times a day (BID) | OPHTHALMIC | Status: DC
  Administered 2016-02-23 – 2016-02-26 (×7): 1 [drp] via OPHTHALMIC
  Filled 2016-02-23: qty 5

## 2016-02-23 MED ORDER — VANCOMYCIN HCL 500 MG IV SOLR
500.0000 mg | Freq: Two times a day (BID) | INTRAVENOUS | Status: DC
  Administered 2016-02-23 – 2016-02-24 (×3): 500 mg via INTRAVENOUS
  Filled 2016-02-23 (×4): qty 500

## 2016-02-23 MED ORDER — DEXTROSE 50 % IV SOLN
1.0000 | Freq: Once | INTRAVENOUS | Status: AC
  Administered 2016-02-23: 50 mL via INTRAVENOUS
  Filled 2016-02-23: qty 50

## 2016-02-23 MED ORDER — GUAIFENESIN-DM 100-10 MG/5ML PO SYRP
5.0000 mL | ORAL_SOLUTION | ORAL | Status: DC | PRN
  Administered 2016-02-24: 5 mL via ORAL
  Filled 2016-02-23: qty 10

## 2016-02-23 MED ORDER — ALBUTEROL SULFATE (2.5 MG/3ML) 0.083% IN NEBU
2.5000 mg | INHALATION_SOLUTION | Freq: Four times a day (QID) | RESPIRATORY_TRACT | Status: DC | PRN
  Filled 2016-02-23: qty 3

## 2016-02-23 MED ORDER — DEXTROSE 50 % IV SOLN
25.0000 g | INTRAVENOUS | Status: DC | PRN
  Filled 2016-02-23: qty 50

## 2016-02-23 MED ORDER — LATANOPROST 0.005 % OP SOLN
1.0000 [drp] | Freq: Every day | OPHTHALMIC | Status: DC
  Administered 2016-02-23 – 2016-02-25 (×3): 1 [drp] via OPHTHALMIC
  Filled 2016-02-23: qty 2.5

## 2016-02-23 MED ORDER — DEXTROSE 50 % IV SOLN
25.0000 mL | Freq: Once | INTRAVENOUS | Status: AC
  Administered 2016-02-23: 25 mL via INTRAVENOUS
  Filled 2016-02-23: qty 50

## 2016-02-23 MED ORDER — DEXTROSE 5 % IV SOLN
INTRAVENOUS | Status: DC
  Administered 2016-02-23: 02:00:00 via INTRAVENOUS

## 2016-02-23 MED ORDER — ACETAMINOPHEN 325 MG PO TABS
650.0000 mg | ORAL_TABLET | Freq: Four times a day (QID) | ORAL | Status: DC | PRN
  Administered 2016-02-23 – 2016-02-25 (×4): 650 mg via ORAL
  Filled 2016-02-23 (×4): qty 2

## 2016-02-23 MED ORDER — DEXTROSE 10 % IV SOLN
INTRAVENOUS | Status: DC
  Administered 2016-02-23: 04:00:00 via INTRAVENOUS
  Filled 2016-02-23: qty 1000

## 2016-02-23 MED ORDER — ONDANSETRON HCL 4 MG PO TABS: 4.0000 mg | ORAL_TABLET | Freq: Four times a day (QID) | ORAL | Status: DC | PRN

## 2016-02-23 MED ORDER — DEXTROSE 50 % IV SOLN
INTRAVENOUS | Status: AC
Start: 2016-02-23 — End: 2016-02-23
  Filled 2016-02-23: qty 50

## 2016-02-23 MED ORDER — DEXTROSE 5 % IV SOLN
1.0000 g | Freq: Once | INTRAVENOUS | Status: AC
  Administered 2016-02-23: 1 g via INTRAVENOUS
  Filled 2016-02-23: qty 10

## 2016-02-23 MED ORDER — DEXTROSE 10 % IV SOLN
INTRAVENOUS | Status: DC
  Administered 2016-02-23 – 2016-02-24 (×2): via INTRAVENOUS
  Filled 2016-02-23 (×2): qty 1000

## 2016-02-23 NOTE — Progress Notes (Addendum)
PROGRESS NOTE    Brittany Schwartz  [id number]  DOB: 08-18-1951  DOA: 02/23/2016 PCP: [name], MD Outpatient Specialists: GYN Oncology: Dr. Rolley Sims course: 65 year old female, married, lives at home with spouse, PMH of recurrent high-grade endometrial carcinoma who initially presented in January 2017 with large volume malignant ascites, has a complicated course since then including acute respiratory failure & acute renal failure in February 2017 requiring ventilatory support and CRRT, has thus far received 1 cycle of Adriamycin 01/10/16 and not well enough to continue chemotherapy, malignant ascites status post large volume paracentesis in February that contributed to acute renal failure, large right pleural effusion status post thoracentesis 02/12/16-cytology negative, multifactorial anemia, DM with hypoglycemia, severe protein calorie malnutrition, recent hospitalization 02/07/16-02/15/16 for sepsis secondary to HCAP, discharged home after that admission, returns to ED on 02/23/16 due to altered mental status (confused and less responsive) and noted by EMS to be hypoglycemic with CBG of 26, some orthopnea and nonproductive cough. Recurrent hypoglycemia in ED-treating with IV D10 and when necessary D50, status post right therapeutic thoracentesis. Primary oncologist and palliative care consulted. Progressive general decline since cancer diagnosis with frequent hospitalization.   Assessment & Plan:   DM type II with hypoglycemia - Most likely secondary to poor oral intake and ongoing oral hypoglycemics. - Discontinue oral hypoglycemic/glipizide and may have to stay off same indefinitely. - Monitor CBGs closely and resuscitate with when necessary IV D50, low-volume IV D10 infusion and regular diet.  Recurrent right >left pleural effusion - Previous thoracentesis 02/12/16 was cytology negative. - Status post ultrasound-guided therapeutic & diagnostic thoracentesis by IR  on 3/24 yielding 850 mL of yellow fluid. - If develops recurrent effusions, may need Pleurx placement. - Low index of suspicion for pneumonia. Check pro calcitonin and DC antibiotics if negative.  Recurrent malignant ascites - Has had prior large volume paracentesis contribute into acute renal failure. - Currently not very symptomatic. She will eventually need catheter placement for frequent low-volume paracentesis which could be done at home.  Anemia - Secondary to malignancy and chronic disease. - Follow CBC in a.m. and consider transfusion if hemoglobin <7 g per DL.  Thrombocytopenia - Follow CBCs  Leukocytosis - May be reactive. Follow CBCs  Recurrent high-grade endometrial carcinoma - Thus far patient has been able to tolerate a single cycle of Adriamycin on 01/10/16 and does not well he left to continue. - Discussed with patient's oncologist: Poor prognosis and agrees with palliative care consultation for goals of care. She will see patient in the hospital.  Severe protein calorie malnutrition - Secondary to malignancy and poor oral intake. - Regular diet.  Anasarca - Secondary to hypoalbuminemia related to malignancy and poor oral intake. May consider low-dose Lasix.  Adult failure to thrive - Poor prognosis. Progressive decline since cancer diagnosis and January 2017. Discussed at length with patient's daughter at bedside and recommended palliative care consultation for goals of care which she is agreeable to.  Recently treated healthcare associated pneumonia   DVT prophylaxis: SCD's Code Status: SCDs Family Communication: Discussed extensively with patient's daughter Ms Blimie Bachand at bedside. Updated care and answered questions. Disposition Plan: To be determined.? Home with hospice.   Consultants:  Interventional radiology  Medical oncology  Palliative care medicine  Procedures:  Ultrasound-guided right diagnostic and therapeutic thoracentesis that yielded  850 mL of pleural fluid  Antimicrobials:  IV Rocephin 1 dose 3/23  IV Zosyn 3/24 >  IV vancomycin 3/23 >  Subjective: Seen this morning  in the emergency department prior to thoracentesis. Some orthopnea. Minimal mostly dry cough. No chest pain. No fever or chills. Diarrhea 2 days ago which has resolved. Some urinary and fecal incontinence. As per daughter, mental status has returned to normal.  Objective: Filed Vitals:   02/23/16 0950 02/23/16 0955 02/23/16 1030 02/23/16 1158  BP: 109/69 111/73 137/92 132/68  Pulse:   122 124  Temp:    98.7 F (37.1 C)  TempSrc:    Oral  Resp:    20  Height:    5\' 1"  (1.549 m)  Weight:    60.1 kg (132 lb 7.9 oz)  SpO2:   100% 99%    Intake/Output Summary (Last 24 hours) at 02/23/16 1332 Last data filed at 02/23/16 1328  Gross per 24 hour  Intake 697.17 ml  Output      0 ml  Net 697.17 ml   Filed Weights   02/23/16 1158  Weight: 60.1 kg (132 lb 7.9 oz)    Exam:  General exam: Pleasant middle-aged female, looks older than stated age, moderately built and frail, chronically ill-looking, lying comfortably supine in the gurney in the ED. Respiratory system: Reduced breath sounds in the bases, right >left. Rest of lung fields clear to auscultation. No increased work of breathing. Cardiovascular system: S1 & S2 heard, RRR. No JVD, murmurs, gallops, clicks. 2+ pitting bilateral leg edema. Gastrointestinal system: Abdomen is moderately distended, not tense, nontender, soft and normal bowel sounds heard. Irregular hard masses felt in the right quadrants and suprapubic region. Ascites + +. Central nervous system: Alert and oriented. No focal neurological deficits. Extremities: Symmetric 5 x 5 power.   Data Reviewed: Basic Metabolic Panel:  Recent Labs Lab 02/19/16 1221 02/23/16 0237 02/23/16 0645  NA 139 137 133*  K 4.0 3.9 3.8  CL  --  101 102  CO2 29  --  22  GLUCOSE 146* 55* 201*  BUN 14.7 16 14   CREATININE 1.2* 1.00 1.13*    CALCIUM 7.9*  --  7.3*   Liver Function Tests:  Recent Labs Lab 02/19/16 1221 02/23/16 0645  AST 30 56*  ALT 37 42  ALKPHOS 143 116  BILITOT 0.38 0.3  PROT 6.1* 5.2*  ALBUMIN 1.6* 1.5*   No results for input(s): LIPASE, AMYLASE in the last 168 hours. No results for input(s): AMMONIA in the last 168 hours. CBC:  Recent Labs Lab 02/19/16 1221 02/23/16 0159 02/23/16 0237 02/23/16 0645  WBC 19.0* 19.1*  --  17.4*  NEUTROABS 15.6* 15.8*  --  14.3*  HGB 8.6* 8.3* 8.5* 7.9*  HCT 27.7* 25.5* 25.0* 24.3*  MCV 86.7 84.7  --  85.3  PLT 139* 153  --  136*   Cardiac Enzymes: No results for input(s): CKTOTAL, CKMB, CKMBINDEX, TROPONINI in the last 168 hours. BNP (last 3 results) No results for input(s): PROBNP in the last 8760 hours. CBG:  Recent Labs Lab 02/23/16 0625 02/23/16 0811 02/23/16 0903 02/23/16 1026 02/23/16 1123  GLUCAP 204* 139* 130* 65 155*    No results found for this or any previous visit (from the past 240 hour(s)).       Studies: Dg Chest 1 View  02/23/2016  CLINICAL DATA:  Status post right-sided thoracentesis. History of endometrial carcinoma EXAM: CHEST 1 VIEW COMPARISON:  Study obtained earlier in the day FINDINGS: There is no appreciable pneumothorax. There has been virtually complete resolution of right pleural effusion. Currently there is slight bibasilar atelectasis. Lungs elsewhere clear. Heart size and  pulmonary vascularity are normal. No adenopathy. Port-A-Cath tip is in the superior vena cava. IMPRESSION: No demonstrable pneumothorax. Mild bibasilar atelectatic change. No change in cardiac silhouette. Electronically Signed   By: Lowella Grip III M.D.   On: 02/23/2016 10:16   Dg Chest 2 View  02/23/2016  CLINICAL DATA:  65 year old female with hypoglycemia. History of uterine cancer. Shortness of breath. EXAM: CHEST  2 VIEW COMPARISON:  Radiograph dated 02/12/2016 FINDINGS: Two views of the chest demonstrate a moderate size right  pleural effusion, increased from prior study. There is associated compressive atelectasis of the right lung base. Superimposed pneumonia is not excluded. There is a small left pleural effusion. No pneumothorax identified. There is a stable cardiomegaly. Right pectoral Port-A-Cath in stable positioning. There is degenerative changes of the shoulders. No acute fracture. IMPRESSION: Moderate right and small left pleural effusions, increased from prior study. Electronically Signed   By: Anner Crete M.D.   On: 02/23/2016 02:34        Scheduled Meds: . latanoprost  1 drop Both Eyes QHS  . piperacillin-tazobactam (ZOSYN)  IV  3.375 g Intravenous Q8H  . timolol  1 drop Both Eyes BID  . vancomycin  500 mg Intravenous Q12H   Continuous Infusions: . dextrose 30 mL/hr at 02/23/16 0901    Active Problems:   Endometrial cancer ([place])   Anemia associated with chemotherapy   Malignant ascites   Hypoglycemia   Pleural effusion on right   Hypoglycemia associated with diabetes ([place])    Time spent: 45 minutes.    Vernell Leep, MD, FACP, FHM. [facility] Pager [phone]  If 7PM-7AM, please contact night-coverage [email or web address] Password [email or web address] 02/23/2016, 1:32 PM    LOS: 0 days

## 2016-02-23 NOTE — Progress Notes (Signed)
Initial Nutrition Assessment  DOCUMENTATION CODES:   Not applicable  INTERVENTION:  - Continue Regular diet - RD will continue to monitor for needs, further discussion of nutrition supplements at follow-up  NUTRITION DIAGNOSIS:   Increased nutrient needs related to catabolic illness, cancer and cancer related treatments as evidenced by estimated needs.  GOAL:   Patient will meet greater than or equal to 90% of their needs  MONITOR:   PO intake, Weight trends, Labs, I & O's  REASON FOR ASSESSMENT:   Consult Assessment of nutrition requirement/status  ASSESSMENT:   65 y.o. female with known history of recurrent endometrial carcinoma who was recently admitted for pleural effusion and pneumonia and also was admitted in January and February and at that time patient also required dialysis and intubation was brought to the ER after patient was found to be hypoglycemic with blood sugar in the 20s. Patient last midnight was found to be confused and less responsive and EMS was called and patient was found to have a CBG of 26 and in the ER as to be started on D10 after patient was having recurrent low blood sugar levels. Presently patient is alert and awake. In addition patient has been having increasing shortness of breath with some productive cough. Patient has just finished her antibiotic course yesterday. Chest x-ray shows recurrence of pleural effusion. Patient will be admitted for further management of hypoglycemia and shortness of breath.   Pt seen for consult. BMI indicates overweight. Per chart review, pt ate 100% of breakfast (which was brought in by family and pt reports was cookies and tea) and 50% of lunch. Pt and daughter and niece, who are at bedside, all provide information. PTA pt's appetite was good x1 week but had previously been decreased while undergoing chemo in February. Related to chemo, pt experiences oral and esophageal ulcers, per family report. Pt states that this made  swallowing difficult and decreased appetite and intakes but she denies pain with swallowing despite ulceration. Pt states some slight taste alterations.   At home pt was drinking Glucerna Shake once/day. She states experiencing loose stools when drinking this supplement. Pt is vegetarian and family requested suggestions with increasing protein in her diet; discussed this with them and provided tips. Pt declines offer to order Glucerna during admission and would currently like to focus on the foods being brought in by her family.   Daughter reports pt's UBW is ~126-127 lbs but that she has had recent fluctuations which she attributes to fluid retention in the legs and abdominal region. Pt had thoracentesis this AM with 850cc removed. Physical assessment indicates mild fat wasting to upper arm, no muscle wasting, mild to moderate edema to BLE. Per chart review; weight 12/29/15-02/23/16 fluctuated between 127-164 lbs. Will continue to monitor weight trends and intakes and assess for malnutrition; unable to dx malnutrition at this time.  Medications reviewed. IVF: D10  @ 30 mL/hr which is providing 245 kcal. Labs reviewed; CBGs: 65-155 mg/dL, Na: 133 mmol/L, creatinine elevated and trending up, Ca: 7.3 mg/dL, GFR: 50.   Diet Order:  Diet regular Room service appropriate?: Yes; Fluid consistency:: Thin  Skin:  Reviewed, no issues  Last BM:  PTA  Height:   Ht Readings from Last 1 Encounters:  02/23/16 5\' 1"  (1.549 m)    Weight:   Wt Readings from Last 1 Encounters:  02/23/16 132 lb 7.9 oz (60.1 kg)    Ideal Body Weight:  47.73 kg (kg)  BMI:  Body mass index is  25.05 kg/(m^2).  Estimated Nutritional Needs:   Kcal:  1800-2000  Protein:  80-90 grams  Fluid:  1.5-1.7 L/day  EDUCATION NEEDS:   No education needs identified at this time    Jarome Matin, RD, LDN Inpatient Clinical Dietitian Pager # [phone] After hours/weekend pager # [phone]

## 2016-02-23 NOTE — ED Provider Notes (Signed)
CSN: [id number]     Arrival date & time 02/23/16  0109 History   By signing my name below, I, Brittany Schwartz, attest that this documentation has been prepared under the direction and in the presence of [name], MD.  Electronically Signed: [name], ED Scribe. 02/23/2016. 1:56 AM.   Chief Complaint  Patient presents with  . Shortness of Breath  . Hypoglycemia   Patient is a 65 y.o. female presenting with hypoglycemia. The history is provided by the patient. No language interpreter was used.  Hypoglycemia Initial blood sugar:  21 Blood sugar after intervention:  276 Severity:  Moderate Duration:  1 day Timing:  Constant Progression:  Unchanged Chronicity:  Recurrent Context: recent illness   Relieved by:  None tried Ineffective treatments:  None tried Associated symptoms: altered mental status   Risk factors: no alcohol abuse     LEVEL 5 CAVEAT DUE TO ACUITY OF CONDITION   HPI Comments: [name] brought in by EMS is a 65 y.o. female with a PMHx of HTN, uterine cancer and DM who presents to the Emergency Department here for hypoglycemia this evening. Pt was recently diagnosed with PNA and completed her antibiotic course yesterday. Pt was also discharged from [facility] and sent back to the nursing home. On EMS arrival sugar was 21. D50 was given en route to department which improved blood sugar to 276. Pt has been prescribed Glipizide and Metformin. However, she was recently taken off of Metformin.  PCP: [name], MD    Past Medical History  Diagnosis Date  . Hypertension   . Glaucoma   . Diabetes mellitus without complication ([place])   . Palpitations   . Muscle cramps   . Postmenopausal vaginal bleeding   . Uterine cancer [facility])    Past Surgical History  Procedure Laterality Date  . Robotic assisted total hysterectomy with bilateral salpingo oopherectomy Bilateral 01/19/2015    Procedure: XI ROBOTIC ASSISTED TOTAL HYSTERECTOMY WITH BILATERAL SALPINGO  OOPHORECTOMY;  Surgeon: [name], MD;  Location: [facility];  Service: Gynecology;  Laterality: Bilateral;  . Cystoscopy w/ ureteral stent placement Bilateral 01/13/2016    Procedure: CYSTOSCOPY WITH RETROGRADE PYELOGRAM/BILATERAL URETERAL STENT PLACEMENT;  Surgeon: [name], MD;  Location: [facility];  Service: Urology;  Laterality: Bilateral;   Family History  Problem Relation Age of Onset  . Diabetes Father   . Arrhythmia Mother    Social History  Substance Use Topics  . Smoking status: Never Smoker   . Smokeless tobacco: Never Used  . Alcohol Use: No   OB History    No data available     Review of Systems  Unable to perform ROS: Other      Allergies  Review of patient's allergies indicates no known allergies.  Home Medications   Prior to Admission medications   Medication Sig Start Date End Date Taking? Authorizing Provider  acetaminophen (TYLENOL) 325 MG tablet Take 650 mg by mouth every 6 (six) hours as needed for mild pain, moderate pain or headache. Reported on 01/08/2016   Yes Historical Provider, MD  albuterol (PROVENTIL) (2.5 MG/3ML) 0.083% nebulizer solution Take 3 mLs (2.5 mg total) by nebulization every 6 (six) hours as needed for wheezing or shortness of breath. 02/15/16  Yes [name], MD  glipiZIDE (GLUCOTROL XL) 2.5 MG 24 hr tablet Take 2.5 mg by mouth daily with breakfast.  01/31/16  Yes Historical Provider, MD  latanoprost (XALATAN) 0.005 % ophthalmic solution Place 1 drop into both eyes at bedtime.  Yes Historical Provider, MD  levofloxacin (LEVAQUIN) 500 MG tablet Take 1 tablet (500 mg total) by mouth daily. 02/15/16  Yes [name], MD  lidocaine-prilocaine (EMLA) cream Apply to Porta-Cath 1-2 hrs prior to access as directed. 01/02/16  Yes [name], MD  timolol (BETIMOL) 0.5 % ophthalmic solution Place 1 drop into both eyes 2 (two) times daily.   Yes Historical Provider, MD  feeding supplement, GLUCERNA SHAKE, (GLUCERNA SHAKE) LIQD Take 237  mLs by mouth 3 (three) times daily between meals. Patient not taking: Reported on 02/23/2016 02/15/16   [name], MD  ferrous fumarate (HEMOCYTE - 106 MG FE) 325 (106 FE) MG TABS tablet Take 1 tablet daily  on an empty stomach with OJ or vitamin C tablet Patient not taking: Reported on 02/23/2016 09/11/15   [name], MD  guaiFENesin-dextromethorphan (ROBITUSSIN DM) 100-10 MG/5ML syrup Take 5 mLs by mouth every 4 (four) hours as needed for cough. Patient not taking: Reported on 02/23/2016 02/15/16   [name], MD  LORazepam (ATIVAN) 0.5 MG tablet Place 1 tablet under tongue or swallow every 8 hours as needed for nausea. Will make drowsy. Patient not taking: Reported on 02/19/2016 01/30/16   [name], MD  nystatin (MYCOSTATIN) 100000 UNIT/ML suspension Take 5 mLs (500,000 Units total) by mouth 4 (four) times daily. Patient not taking: Reported on 02/23/2016 02/15/16   [name], MD  ondansetron (ZOFRAN) 8 MG tablet Take 1 tablet by mouth every 8 hours as needed for nausea. Will not make drowsy. Patient not taking: Reported on 02/19/2016 01/02/16   [name], MD  oxyCODONE (ROXICODONE) 5 MG immediate release tablet Take 1 tablet every 4-6 hours as needed for pain. Patient not taking: Reported on 02/23/2016 02/19/16   [name], MD  pantoprazole (PROTONIX) 40 MG tablet Take 1 tablet (40 mg total) by mouth daily. Patient not taking: Reported on 02/19/2016 02/19/16   [name], MD   Triage Vitals: BP 140/81 mmHg  Pulse 114  Temp(Src) 97.7 F (36.5 C) (Oral)  Resp 12  SpO2 100%   Physical Exam  Constitutional: She is oriented to person, place, and time. She appears well-developed and well-nourished. No distress.  HENT:  Head: Normocephalic and atraumatic.  Mouth/Throat: Oropharynx is clear and moist. No oropharyngeal exudate.  Eyes: EOM are normal. Pupils are equal, round, and reactive to light.  Neck: Normal range of motion.  Cardiovascular: Normal rate,  regular rhythm and normal heart sounds.   No murmur heard. Pulses:      Dorsalis pedis pulses are 2+ on the right side, and 2+ on the left side.  Pulmonary/Chest: Effort normal. She has rales (course bilaterally).  Abdominal: Soft. She exhibits distension. There is no tenderness.  Fluid wave noted   Musculoskeletal: Normal range of motion.  Some peripheral edema noted All compartments soft  Neurological: She is alert and oriented to person, place, and time.  Skin: Skin is warm and dry.  Psychiatric: She has a normal mood and affect. Judgment normal.  Nursing note and vitals reviewed.   ED Course  Procedures (including critical care time)  DIAGNOSTIC STUDIES: Oxygen Saturation is 100% on RA, Normal by my interpretation.    COORDINATION OF CARE: 1:44 AM- Will order CXR and blood work. Discussed treatment plan with pt at bedside and pt agreed to plan.     3:27 AM- Spoke with Dr. Hal Hope. Will be admitted to inpatient step down room 4.  Labs  Review Labs Reviewed  CBG MONITORING, ED    Imaging Review No results found. I have personally reviewed and evaluated these images and lab results as part of my medical decision-making.   EKG Interpretation None      MDM   Final diagnoses:  None   Results for orders placed or performed during the hospital encounter of 02/23/16  CBC with Differential/Platelet  Result Value Ref Range   WBC 19.1 (H) 4.0 - 10.5 K/uL   RBC 3.01 (L) 3.87 - 5.11 MIL/uL   Hemoglobin 8.3 (L) 12.0 - 15.0 g/dL   HCT 25.5 (L) 36.0 - 46.0 %   MCV 84.7 78.0 - 100.0 fL   MCH 27.6 26.0 - 34.0 pg   MCHC 32.5 30.0 - 36.0 g/dL   RDW 15.9 (H) 11.5 - 15.5 %   Platelets 153 150 - 400 K/uL   Neutrophils Relative % 83 %   Lymphocytes Relative 5 %   Monocytes Relative 10 %   Eosinophils Relative 2 %   Basophils Relative 0 %   Neutro Abs 15.8 (H) 1.7 - 7.7 K/uL   Lymphs Abs 1.0 0.7 - 4.0 K/uL   Monocytes Absolute 1.9 (H) 0.1 - 1.0 K/uL   Eosinophils  Absolute 0.4 0.0 - 0.7 K/uL   Basophils Absolute 0.0 0.0 - 0.1 K/uL   RBC Morphology POLYCHROMASIA PRESENT   CBG monitoring, ED  Result Value Ref Range   Glucose-Capillary 96 65 - 99 mg/dL  I-Stat Chem 8, ED  Result Value Ref Range   Sodium 137 135 - 145 mmol/L   Potassium 3.9 3.5 - 5.1 mmol/L   Chloride 101 101 - 111 mmol/L   BUN 16 6 - 20 mg/dL   Creatinine, Ser 1.00 0.44 - 1.00 mg/dL   Glucose, Bld 55 (L) 65 - 99 mg/dL   Calcium, Ion 1.03 (L) 1.13 - 1.30 mmol/L   TCO2 26 0 - 100 mmol/L   Hemoglobin 8.5 (L) 12.0 - 15.0 g/dL   HCT 25.0 (L) 36.0 - 46.0 %  POC CBG, ED  Result Value Ref Range   Glucose-Capillary 50 (L) 65 - 99 mg/dL   Dg Chest 1 View  02/12/2016  CLINICAL DATA:  Post right thoracentesis. EXAM: CHEST 1 VIEW COMPARISON:  02/12/2016 at 7:56 a.m. FINDINGS: Right IJ Port-A-Cath unchanged. Lungs are hypoinflated with interval improvement in right-sided effusion with mild residual right base opacification likely combination of minimal pleural fluid with atelectasis. No evidence of right-sided pneumothorax. Mild stable left base opacification likely layering small left effusion with atelectasis. Mild stable cardiomegaly. Remainder of the exam is unchanged. IMPRESSION: Post right thoracentesis with improved right effusion. Mild residual opacification in the right base likely small amount of pleural fluid with atelectasis. No pneumothorax. Stable left base opacification likely small effusion with atelectasis. Electronically Signed   By: Marin Olp M.D.   On: 02/12/2016 14:36   Dg Chest 2 View  02/23/2016  CLINICAL DATA:  65 year old female with hypoglycemia. History of uterine cancer. Shortness of breath. EXAM: CHEST  2 VIEW COMPARISON:  Radiograph dated 02/12/2016 FINDINGS: Two views of the chest demonstrate a moderate size right pleural effusion, increased from prior study. There is associated compressive atelectasis of the right lung base. Superimposed pneumonia is not excluded.  There is a small left pleural effusion. No pneumothorax identified. There is a stable cardiomegaly. Right pectoral Port-A-Cath in stable positioning. There is degenerative changes of the shoulders. No acute fracture. IMPRESSION: Moderate right and small left pleural effusions, increased  from prior study. Electronically Signed   By: Anner Crete M.D.   On: 02/23/2016 02:34   Dg Chest 2 View  02/12/2016  CLINICAL DATA:  Shortness breath. Pneumonia. Endometrial carcinoma. EXAM: CHEST  2 VIEW COMPARISON:  02/10/2016 FINDINGS: Moderate bilateral pleural effusions are again seen with associated bibasilar atelectasis or consolidation. This shows no significant interval change. Power port remains in place. Heart size is stable. No pneumothorax visualized. IMPRESSION: No significant change in bilateral pleural effusions and bibasilar atelectasis or consolidation. Electronically Signed   By: Earle Gell M.D.   On: 02/12/2016 08:41   Dg Chest 2 View  02/10/2016  CLINICAL DATA:  65 year old female with a history of shortness of breath, pneumonia. EXAM: CHEST - 2 VIEW COMPARISON:  02/07/2016, 01/26/2016 FINDINGS: Cardiomediastinal silhouette likely unchanged though partially obscured by overlying lung and pleural disease. Low lung volumes bilaterally. Mixed interstitial airspace opacities at the bilateral lower lungs, obscuring the hemidiaphragm on the left and right and the heart borders. Lateral view demonstrates opacification of the costophrenic sulcus. Fullness in the central vasculature. Right IJ port catheter. No displaced fracture. Unremarkable appearance of the upper abdomen. IMPRESSION: Interval development of bilateral lower lung opacities, potentially combination of infection, edema, atelectasis, and bilateral pleural effusions. Signed, Dulcy Fanny. Earleen Newport, DO [facility] Electronically Signed   By: Corrie Mckusick D.O.   On: 02/10/2016 10:37   Dg Chest  2 View  02/07/2016  CLINICAL DATA:  Pneumonia, undergoing current treatment. EXAM: CHEST  2 VIEW COMPARISON:  01/26/2016 FINDINGS: Bibasilar airspace opacities are similar to prior study may reflect pneumonia. Small bilateral effusions. Right Port-A-Cath is in place with the tip in the SVC. Interval removal of left internal jugular central line. Heart is normal size. IMPRESSION: Bibasilar opacities concerning for pneumonia. Small effusions. No real change since prior study. Electronically Signed   By: Rolm Baptise M.D.   On: 02/07/2016 14:24   Dg Knee 1-2 Views Left  02/15/2016  CLINICAL DATA:  [place] today.  Left knee pain and buckling. EXAM: LEFT KNEE - 1-2 VIEW COMPARISON:  None. FINDINGS: The joint spaces are fairly well maintained. There are mild/early degenerative changes. No acute fracture or osteochondral lesion. IMPRESSION: No acute bony findings or joint effusion. Electronically Signed   By: Marijo Sanes M.D.   On: 02/15/2016 14:38   Dg Knee 1-2 Views Right  02/15/2016  CLINICAL DATA:  Status post fall.  Knee buckling. EXAM: RIGHT KNEE - 1-2 VIEW COMPARISON:  None. FINDINGS: No fracture, dislocation, or joint effusion. IMPRESSION: No acute abnormality. Electronically Signed   By: Dorise Bullion III M.D   On: 02/15/2016 14:37   US Paracentesis  02/06/2016  INDICATION: Patient with history of high-grade serous endometrial carcinoma, recurrent ascites. Request made for therapeutic paracentesis. EXAM: ULTRASOUND GUIDED THERAPEUTIC PARACENTESIS MEDICATIONS: None. COMPLICATIONS: None immediate. PROCEDURE: Informed written consent was obtained from the patient after a discussion of the risks, benefits and alternatives to treatment. A timeout was performed prior to the initiation of the procedure. Initial ultrasound scanning demonstrates a small to moderate amount of ascites within the left lower abdominal quadrant. The left lower abdomen was prepped and draped in the usual sterile fashion. 1% lidocaine  was used for local anesthesia. Following this, a Yueh catheter was introduced. An ultrasound image was saved for documentation purposes. The paracentesis was performed. The catheter was removed and a dressing was applied. The patient tolerated the procedure well without immediate post procedural complication. FINDINGS: A  total of approximately 2.8 liters of yellow fluid was removed. IMPRESSION: Successful ultrasound-guided therapeutic paracentesis yielding 2.8 liters of peritoneal fluid. Read by: Rowe Robert, PA-C Electronically Signed   By: Sandi Mariscal M.D.   On: 02/06/2016 12:21   Dg Chest Port 1 View  01/26/2016  CLINICAL DATA:  Coughing with phlegm and mid chest pain for 3-4 days. EXAM: PORTABLE CHEST 1 VIEW COMPARISON:  01/13/2016 FINDINGS: 1614 hours. Endotracheal tube has been removed in the interval. The right Port-A-Cath tip remains positioned over the proximal mid SVC. The tip of the left IJ central line is positioned in the proximal SVC, just below the innominate vein confluence. The lungs are clear wiithout focal pneumonia, edema, pneumothorax or pleural effusion. The cardiopericardial silhouette is within normal limits for size. Question tiny left pleural effusion. The visualized bony structures of the thorax are intact. IMPRESSION: 1. Interval extubation. 2. Question tiny left pleural effusion. Electronically Signed   By: Misty Stanley M.D.   On: 01/26/2016 16:29   US Thoracentesis Asp Pleural Space W/img Guide  02/12/2016  INDICATION: Endometrial cancer, pneumonia, dyspnea, bilateral pleural effusions; request is made for diagnostic and therapeutic right thoracentesis. EXAM: ULTRASOUND GUIDED DIAGNOSTIC AND THERAPEUTIC RIGHT THORACENTESIS MEDICATIONS: None. COMPLICATIONS: None immediate. PROCEDURE: An ultrasound guided thoracentesis was thoroughly discussed with the patient and questions answered. The benefits, risks, alternatives and complications were also discussed. The patient understands  and wishes to proceed with the procedure. Written consent was obtained. Ultrasound was performed to localize and mark an adequate pocket of fluid in the right chest. The area was then prepped and draped in the normal sterile fashion. 1% Lidocaine was used for local anesthesia. Under ultrasound guidance a Safe-T-Centesis catheter was introduced. Thoracentesis was performed. The catheter was removed and a dressing applied. FINDINGS: A total of approximately 700 cc of slightly hazy, yellow fluid was removed. Samples were sent to the laboratory as requested by the clinical team. IMPRESSION: Successful ultrasound guided diagnostic and therapeutic right thoracentesis yielding 700 cc of pleural fluid. Read by: Rowe Robert, PA-C Electronically Signed   By: Jerilynn Mages.  Shick M.D.   On: 02/12/2016 14:44    Medications  dextrose 5 % solution ( Intravenous Stopped 02/23/16 0330)  dextrose 10 % infusion ( Intravenous New Bag/Given 02/23/16 0332)  dextrose 50 % solution 25 mL (25 mLs Intravenous Given 02/23/16 0317)    I personally performed the services described in this documentation, which was scribed in my presence. The recorded information has been reviewed and is accurate.     Veatrice Kells, MD 02/23/16 [phone]

## 2016-02-23 NOTE — H&P (Signed)
[facility] History and Physical  Terica Twyman [id number] DOB: [birth year] DOA: 02/23/2016  Referring physician: ER physician. PCP: [name], MD  Specialists: Dr.Livesay.  Chief Complaint: Low blood glucose.  History obtained from patient's daughter.  HPI: Brittany Schwartz is a 65 y.o. female with known history of recurrent endometrial carcinoma who was recently admitted for pleural effusion and pneumonia and also was admitted in January and February and at that time patient also required dialysis and intubation was brought to the ER after patient was found to be hypoglycemic with blood sugar in the 20s. Patient last midnight was found to be confused and less responsive and EMS was called and patient was found to have a CBG of 26 and in the ER as to be started on D10 after patient was having recurrent low blood sugar levels. Presently patient is alert and awake. In addition patient has been having increasing shortness of breath with some productive cough. Patient has just finished her antibiotic course yesterday. Chest x-ray shows recurrence of pleural effusion. Patient will be admitted for further management of hypoglycemia and shortness of breath.   Review of Systems: As presented in the history of presenting illness, rest negative.  Past Medical History  Diagnosis Date  . Hypertension   . Glaucoma   . Diabetes mellitus without complication ([place])   . Palpitations   . Muscle cramps   . Postmenopausal vaginal bleeding   . Uterine cancer [facility])    Past Surgical History  Procedure Laterality Date  . Robotic assisted total hysterectomy with bilateral salpingo oopherectomy Bilateral 01/19/2015    Procedure: XI ROBOTIC ASSISTED TOTAL HYSTERECTOMY WITH BILATERAL SALPINGO OOPHORECTOMY;  Surgeon: [name], MD;  Location: [facility];  Service: Gynecology;  Laterality: Bilateral;  . Cystoscopy w/ ureteral stent placement Bilateral 01/13/2016    Procedure: CYSTOSCOPY WITH RETROGRADE  PYELOGRAM/BILATERAL URETERAL STENT PLACEMENT;  Surgeon: [name], MD;  Location: [facility];  Service: Urology;  Laterality: Bilateral;   Social History:  reports that she has never smoked. She has never used smokeless tobacco. She reports that she does not drink alcohol or use illicit drugs. Where does patient live Home. Can patient participate in ADLs? Yes.  No Known Allergies  Family History:  Family History  Problem Relation Age of Onset  . Diabetes Father   . Arrhythmia Mother       Prior to Admission medications   Medication Sig Start Date End Date Taking? Authorizing Provider  acetaminophen (TYLENOL) 325 MG tablet Take 650 mg by mouth every 6 (six) hours as needed for mild pain, moderate pain or headache. Reported on 01/08/2016   Yes Historical Provider, MD  albuterol (PROVENTIL) (2.5 MG/3ML) 0.083% nebulizer solution Take 3 mLs (2.5 mg total) by nebulization every 6 (six) hours as needed for wheezing or shortness of breath. 02/15/16  Yes [name], MD  glipiZIDE (GLUCOTROL XL) 2.5 MG 24 hr tablet Take 2.5 mg by mouth daily with breakfast.  01/31/16  Yes Historical Provider, MD  latanoprost (XALATAN) 0.005 % ophthalmic solution Place 1 drop into both eyes at bedtime.   Yes Historical Provider, MD  levofloxacin (LEVAQUIN) 500 MG tablet Take 1 tablet (500 mg total) by mouth daily. 02/15/16  Yes [name], MD  lidocaine-prilocaine (EMLA) cream Apply to Porta-Cath 1-2 hrs prior to access as directed. 01/02/16  Yes [name], MD  timolol (BETIMOL) 0.5 % ophthalmic solution Place 1 drop into both eyes 2 (two) times daily.   Yes Historical Provider, MD  feeding  supplement, GLUCERNA SHAKE, (GLUCERNA SHAKE) LIQD Take 237 mLs by mouth 3 (three) times daily between meals. Patient not taking: Reported on 02/23/2016 02/15/16   [name], MD  ferrous fumarate (HEMOCYTE - 106 MG FE) 325 (106 FE) MG TABS tablet Take 1 tablet daily  on an empty stomach with OJ or vitamin C tablet Patient  not taking: Reported on 02/23/2016 09/11/15   [name], MD  guaiFENesin-dextromethorphan (ROBITUSSIN DM) 100-10 MG/5ML syrup Take 5 mLs by mouth every 4 (four) hours as needed for cough. Patient not taking: Reported on 02/23/2016 02/15/16   [name], MD  LORazepam (ATIVAN) 0.5 MG tablet Place 1 tablet under tongue or swallow every 8 hours as needed for nausea. Will make drowsy. Patient not taking: Reported on 02/19/2016 01/30/16   [name], MD  nystatin (MYCOSTATIN) 100000 UNIT/ML suspension Take 5 mLs (500,000 Units total) by mouth 4 (four) times daily. Patient not taking: Reported on 02/23/2016 02/15/16   [name], MD  ondansetron (ZOFRAN) 8 MG tablet Take 1 tablet by mouth every 8 hours as needed for nausea. Will not make drowsy. Patient not taking: Reported on 02/19/2016 01/02/16   [name], MD  oxyCODONE (ROXICODONE) 5 MG immediate release tablet Take 1 tablet every 4-6 hours as needed for pain. Patient not taking: Reported on 02/23/2016 02/19/16   [name], MD  pantoprazole (PROTONIX) 40 MG tablet Take 1 tablet (40 mg total) by mouth daily. Patient not taking: Reported on 02/19/2016 02/19/16   [name], MD    Physical Exam: Filed Vitals:   02/23/16 0400 02/23/16 0430 02/23/16 0500 02/23/16 0530  BP: 114/67 110/65 122/77 108/71  Pulse: 102 99 102 95  Temp:      TempSrc:      Resp: 14   16  SpO2: 100% 100% 100% 100%     General:  Moderately built and poorly nourished.  Eyes: Anicteric no pallor.  ENT: No discharge from the ears eyes nose and mouth.  Neck: No mass felt.  Cardiovascular: S1 and S2 heard.  Respiratory: No rhonchi or crepitations.  Abdomen: Soft nontender bowel sounds present. Patient's abdomen is distended.  Skin: No rash.  Musculoskeletal: No edema.  Psychiatric: Appears normal.  Neurologic: Alert awake oriented to time place and person. Moves all extremities.  Labs on Admission:  Basic Metabolic  Panel:  Recent Labs Lab 02/19/16 1221 02/23/16 0237  NA 139 137  K 4.0 3.9  CL  --  101  CO2 29  --   GLUCOSE 146* 55*  BUN 14.7 16  CREATININE 1.2* 1.00  CALCIUM 7.9*  --    Liver Function Tests:  Recent Labs Lab 02/19/16 1221  AST 30  ALT 37  ALKPHOS 143  BILITOT 0.38  PROT 6.1*  ALBUMIN 1.6*   No results for input(s): LIPASE, AMYLASE in the last 168 hours. No results for input(s): AMMONIA in the last 168 hours. CBC:  Recent Labs Lab 02/19/16 1221 02/23/16 0159 02/23/16 0237  WBC 19.0* 19.1*  --   NEUTROABS 15.6* 15.8*  --   HGB 8.6* 8.3* 8.5*  HCT 27.7* 25.5* 25.0*  MCV 86.7 84.7  --   PLT 139* 153  --    Cardiac Enzymes: No results for input(s): CKTOTAL, CKMB, CKMBINDEX, TROPONINI in the last 168 hours.  BNP (last 3 results)  Recent Labs  01/11/16 1653  BNP 341.7*    ProBNP (last 3 results) No results for input(s): PROBNP in  the last 8760 hours.  CBG:  Recent Labs Lab 02/23/16 0135 02/23/16 0223 02/23/16 0346 02/23/16 0556  GLUCAP 96 50* 95 64*    Radiological Exams on Admission: Dg Chest 2 View  02/23/2016  CLINICAL DATA:  65 year old female with hypoglycemia. History of uterine cancer. Shortness of breath. EXAM: CHEST  2 VIEW COMPARISON:  Radiograph dated 02/12/2016 FINDINGS: Two views of the chest demonstrate a moderate size right pleural effusion, increased from prior study. There is associated compressive atelectasis of the right lung base. Superimposed pneumonia is not excluded. There is a small left pleural effusion. No pneumothorax identified. There is a stable cardiomegaly. Right pectoral Port-A-Cath in stable positioning. There is degenerative changes of the shoulders. No acute fracture. IMPRESSION: Moderate right and small left pleural effusions, increased from prior study. Electronically Signed   By: Anner Crete M.D.   On: 02/23/2016 02:34     Assessment/Plan Active Problems:   Endometrial cancer ([place])   Anemia  associated with chemotherapy   Malignant ascites   Hypoglycemia   Pleural effusion on right   1. Hypoglycemia in a patient with diabetes mellitus type 2 - patient's daughter states patient has not been eating well for last 1 week and only has been eating 25% for a regular meal. At this time we will discontinue her antidiabetic medications and continue with D10 that the patient's blood sugar is stable. 2. Recurrent pleural effusion - chest x-ray shows worsening of pleural effusion. I have ordered ultrasound guided thoracentesis since patient is symptomatic. Until we get results of thoracentesis I will keep patient on antibiotics for now which can be discontinued if there is no evidence of infection. 3. Malignant ascites - patient's oncologist notes states that patient may need catheter placement for frequent paracentesis draining. Note that patient did go into renal failure after paracentesis in February. Patient eventually will need paracentesis during this admission which should be done carefully with low volume at times. Will require sometimes albumin. 4. Hypertension presently off medications. 5. Recurrent endometrial carcinoma - per oncologist.  Palliative care consult requested.   DVT Prophylaxis SCDs in anticipation of procedure.  Code Status: Full code.  Family Communication: Discussed with patient's daughter.  Disposition Plan: Admit to inpatient.    KAKRAKANDY,ARSHAD N. [facility] Pager [phone].  If 7PM-7AM, please contact night-coverage [email or web address] Password [facility] 02/23/2016, 6:09 AM

## 2016-02-23 NOTE — ED Notes (Signed)
Patient just returned to room, will reassess vitals

## 2016-02-23 NOTE — Progress Notes (Signed)
Pharmacy Antibiotic Note  Brittany Schwartz is a 65 y.o. female admitted on 02/23/2016 with pneumonia.  Pharmacy has been consulted for Zosyn/Vancomycin dosing.  Plan: Vancomycin 500mg  IV q12h (VT=15-20 mg/L) Zosyn 3.375 GM IV q8h EI     Temp (24hrs), Avg:97.7 F (36.5 C), Min:97.7 F (36.5 C), Max:97.7 F (36.5 C)   Recent Labs Lab 02/19/16 1221 02/19/16 1221 02/23/16 0159 02/23/16 0237  WBC 19.0*  --  19.1*  --   CREATININE  --  1.2*  --  1.00    Estimated Creatinine Clearance: 45.9 mL/min (by C-G formula based on Cr of 1).    No Known Allergies  Antimicrobials this admission: 3/24 rocephin >> 3/24 3/24 zoysn >>  3/24 vancomycin >>  Dose adjustments this admission:   Microbiology results:  BCx:   UCx:    Sputum:    MRSA PCR:   Thank you for allowing pharmacy to be a part of this patient's care.  Dorrene German 02/23/2016 6:18 AM

## 2016-02-23 NOTE — ED Notes (Signed)
Patient transported to IR 

## 2016-02-23 NOTE — Consult Note (Signed)
Consultation Note Date: 02/23/2016   Patient Name: Brittany Schwartz  DOB: Oct 22, 1951  MRN: [id number]  Age / Sex: 65 y.o., female  PCP: [name], MD Referring Physician: Modena Jansky, MD  Reason for Consultation: Establishing goals of care  Life limiting illness: endometrial cancer.   Clinical Assessment/Narrative:  65 year old female, married, lives at home with spouse in [place], [place], has 2 daughters,PMH of recurrent high-grade endometrial carcinoma who initially presented in January 2017 with large volume malignant ascites, has a complicated course since then including acute respiratory failure & acute renal failure in February 2017 requiring ventilatory support and CRRT, has thus far received 1 cycle of Adriamycin 01/10/16 and not well enough to continue chemotherapy, malignant ascites status post large volume paracentesis in February that contributed to acute renal failure, large right pleural effusion status post thoracentesis 02/12/16-cytology negative, multifactorial anemia, DM with hypoglycemia, severe protein calorie malnutrition, recent hospitalization 02/07/16-02/15/16 for sepsis secondary to HCAP, discharged home after that admission, returns to ED on 02/23/16 due to altered mental status (confused and less responsive) and noted by EMS to be hypoglycemic with CBG of 26, some orthopnea and nonproductive cough. Recurrent hypoglycemia in ED-treating with IV D10 and when necessary D50, status post right therapeutic thoracentesis. Progressive general decline since cancer diagnosis with frequent hospitalizations.  Palliative consulted for continuing goals of care discussions, the patient was seen by palliative care in previous hospitalization in February 2017.   The patient appears weak and deconditioned, she is resting in bed, she doesn't verbalize much. She is s/p thoracentesis earlier today, 2 daughters are  present at the bedside, chart reviewed and discussions held with family as noted below:    Contacts/Participants in Discussion: Primary Decision Maker:     Relationship to Patient  Husband, 2 daughters.  HCPOA: yes     SUMMARY OF RECOMMENDATIONS: Full code Goals of care discussions, gentle discussions held with the patient's daughter [name], patient was resting after her thoracentesis, opened her eyes, but did not engage/participate: family wishes to continue with home health care at home after D/C. They state that they're aware the patient will not be a candidate for further treatments for her malignancy but at this time, do not wish to participate in comfort-hospice type discussions. Active listening and supportive care provided.   Palliative will remain available to provide supportive care or assist with symptom management while continuing to address goals of care.   Code Status/Advance Care Planning: Full code    Code Status Orders        Start     Ordered   02/23/16 0608  Full code   Continuous     02/23/16 0608    Code Status History    Date Active Date Inactive Code Status Order ID Comments User Context   02/11/2016  4:46 PM 02/15/2016  8:15 PM Full Code [id number]  Kelvin Cellar, MD Inpatient   02/11/2016  4:45 PM 02/11/2016  4:46 PM DNR [id number]  Kelvin Cellar, MD Inpatient   02/07/2016  8:35 PM 02/11/2016  4:45 PM Full Code [id number]  Theodis Blaze, MD Inpatient   01/18/2016  2:57 PM 01/30/2016 10:41 PM Partial Code [id number]  Micheline Rough, MD Inpatient   01/11/2016  9:07 PM 01/18/2016  2:57 PM Partial Code [id number]  Rahul Dianna Rossetti, PA-C ED   01/11/2016  8:26 PM 01/11/2016  9:07 PM Partial Code [id number]  Corey Harold, NP ED   01/05/2016  1:38 AM 01/07/2016  7:04 PM Full Code [id number]  Edwin Dada, MD Inpatient   12/29/2015  4:27 PM 01/04/2016  4:59 PM Full Code [id number]  Domenic Polite, MD ED   01/19/2015  8:22 PM 01/20/2015  4:39 PM Full Code [id number]  Lahoma Crocker, MD  Inpatient      Other Directives:None  Symptom Management:    as above   Palliative Prophylaxis:   Bowel Regimen  Additional Recommendations (Limitations, Scope, Preferences):  Full Scope Treatment   Psycho-social/Spiritual:  Support System: [place] for further Chaplaincy support:no Additional Recommendations: Caregiving  Support/Resources  Prognosis: < 3 months ?  Discharge Planning: Home with Home Health likely.    Chief Complaint/ Primary Diagnoses: Present on Admission:  . Hypoglycemia . Pleural effusion on right . Endometrial cancer ([place]) . Malignant ascites . Anemia associated with chemotherapy . Hypoglycemia associated with diabetes ([place])  I have reviewed the medical record, interviewed the patient and family, and examined the patient. The following aspects are pertinent.  Past Medical History  Diagnosis Date  . Hypertension   . Glaucoma   . Diabetes mellitus without complication ([place])   . Palpitations   . Muscle cramps   . Postmenopausal vaginal bleeding   . Uterine cancer [facility])    Social History   Social History  . Marital Status: Married    Spouse Name: N/A  . Number of Children: 2  . Years of Education: N/A   Social History Main Topics  . Smoking status: Never Smoker   . Smokeless tobacco: Never Used  . Alcohol Use: No  . Drug Use: No  . Sexual Activity: Yes   Other Topics Concern  . None   Social History Narrative   Family History  Problem Relation Age of Onset  . Diabetes Father   . Arrhythmia Mother    Scheduled Meds: . latanoprost  1 drop Both Eyes QHS  . piperacillin-tazobactam (ZOSYN)  IV  3.375 g Intravenous Q8H  . timolol  1 drop Both Eyes BID  . vancomycin  500 mg Intravenous Q12H   Continuous Infusions: . dextrose 30 mL/hr at 02/23/16 0901   PRN Meds:.acetaminophen **OR** acetaminophen, albuterol, guaiFENesin-dextromethorphan, ondansetron **OR** ondansetron (ZOFRAN) IV Medications Prior to Admission:  Prior  to Admission medications   Medication Sig Start Date End Date Taking? Authorizing Provider  acetaminophen (TYLENOL) 325 MG tablet Take 650 mg by mouth every 6 (six) hours as needed for mild pain, moderate pain or headache. Reported on 01/08/2016   Yes Historical Provider, MD  albuterol (PROVENTIL) (2.5 MG/3ML) 0.083% nebulizer solution Take 3 mLs (2.5 mg total) by nebulization every 6 (six) hours as needed for wheezing or shortness of breath. 02/15/16  Yes [name], MD  glipiZIDE (GLUCOTROL XL) 2.5 MG 24 hr tablet Take 2.5 mg by mouth daily with breakfast.  01/31/16  Yes Historical Provider, MD  latanoprost (XALATAN) 0.005 % ophthalmic solution Place 1 drop into both eyes at bedtime.   Yes Historical Provider, MD  levofloxacin (LEVAQUIN) 500 MG tablet Take 1 tablet (500 mg total) by mouth daily. 02/15/16  Yes [name], MD  lidocaine-prilocaine (EMLA) cream Apply to Porta-Cath 1-2 hrs prior to access as directed. 01/02/16  Yes [name], MD  timolol (BETIMOL) 0.5 % ophthalmic solution Place 1 drop into both eyes 2 (two) times daily.   Yes Historical Provider, MD  feeding supplement, GLUCERNA SHAKE, (GLUCERNA SHAKE) LIQD Take 237 mLs by mouth 3 (three) times daily between meals. Patient not taking: Reported on 02/23/2016 02/15/16   [name],  MD  ferrous fumarate (HEMOCYTE - 106 MG FE) 325 (106 FE) MG TABS tablet Take 1 tablet daily  on an empty stomach with OJ or vitamin C tablet Patient not taking: Reported on 02/23/2016 09/11/15   [name], MD  guaiFENesin-dextromethorphan (ROBITUSSIN DM) 100-10 MG/5ML syrup Take 5 mLs by mouth every 4 (four) hours as needed for cough. Patient not taking: Reported on 02/23/2016 02/15/16   [name], MD  LORazepam (ATIVAN) 0.5 MG tablet Place 1 tablet under tongue or swallow every 8 hours as needed for nausea. Will make drowsy. Patient not taking: Reported on 02/19/2016 01/30/16   [name], MD  nystatin (MYCOSTATIN) 100000 UNIT/ML  suspension Take 5 mLs (500,000 Units total) by mouth 4 (four) times daily. Patient not taking: Reported on 02/23/2016 02/15/16   [name], MD  ondansetron (ZOFRAN) 8 MG tablet Take 1 tablet by mouth every 8 hours as needed for nausea. Will not make drowsy. Patient not taking: Reported on 02/19/2016 01/02/16   [name], MD  oxyCODONE (ROXICODONE) 5 MG immediate release tablet Take 1 tablet every 4-6 hours as needed for pain. Patient not taking: Reported on 02/23/2016 02/19/16   [name], MD  pantoprazole (PROTONIX) 40 MG tablet Take 1 tablet (40 mg total) by mouth daily. Patient not taking: Reported on 02/19/2016 02/19/16   [name], MD   No Known Allergies  Review of Systems Generalized weakness.   Physical Exam Generalized weakness S1 S2 Shallow diminished Abdomen mild generalized tenderness Trace edema Awake alert.   Vital Signs: BP 132/68 mmHg  Pulse 124  Temp(Src) 98.7 F (37.1 C) (Oral)  Resp 20  Ht 5\' 1"  (1.549 m)  Wt 60.1 kg (132 lb 7.9 oz)  BMI 25.05 kg/m2  SpO2 99%  SpO2: SpO2: 99 % O2 Device:SpO2: 99 % O2 Flow Rate: .O2 Flow Rate (L/min): 2 L/min  IO: Intake/output summary:  Intake/Output Summary (Last 24 hours) at 02/23/16 1559 Last data filed at 02/23/16 1328  Gross per 24 hour  Intake 697.17 ml  Output      0 ml  Net 697.17 ml    LBM:   Baseline Weight: Weight: 60.1 kg (132 lb 7.9 oz) Most recent weight: Weight: 60.1 kg (132 lb 7.9 oz)      Palliative Assessment/Data:  Flowsheet Rows        Most Recent Value   Intake Tab    Referral Department  Hospitalist   Unit at Time of Referral  Med/Surg Unit   Palliative Care Primary Diagnosis  Cancer   Palliative Care Type  Return patient Palliative Care   Reason for referral  Clarify Goals of Care   Date first seen by Palliative Care  02/23/16   Clinical Assessment    Palliative Performance Scale Score  30%   Pain Max last 24 hours  4   Pain Min Last 24 hours  3   Dyspnea Max  Last 24 Hours  4   Dyspnea Min Last 24 hours  3   Psychosocial & Spiritual Assessment    Palliative Care Outcomes    Patient/Family meeting held?  Yes   Who was at the meeting?  2 daughters    Palliative Care follow-up planned  No      Additional Data Reviewed:  CBC:    Component Value Date/Time   WBC 17.4* 02/23/2016 0645   WBC 19.0* 02/19/2016 1221   HGB 7.9* 02/23/2016 0645   HGB 8.6* 02/19/2016 1221  HCT 24.3* 02/23/2016 0645   HCT 27.7* 02/19/2016 1221   PLT 136* 02/23/2016 0645   PLT 139* 02/19/2016 1221   MCV 85.3 02/23/2016 0645   MCV 86.7 02/19/2016 1221   NEUTROABS 14.3* 02/23/2016 0645   NEUTROABS 15.6* 02/19/2016 1221   LYMPHSABS 1.4 02/23/2016 0645   LYMPHSABS 1.4 02/19/2016 1221   MONOABS 1.5* 02/23/2016 0645   MONOABS 1.4* 02/19/2016 1221   EOSABS 0.2 02/23/2016 0645   EOSABS 0.5 02/19/2016 1221   BASOSABS 0.0 02/23/2016 0645   BASOSABS 0.1 02/19/2016 1221   Comprehensive Metabolic Panel:    Component Value Date/Time   NA 133* 02/23/2016 0645   NA 139 02/19/2016 1221   NA 145 02/04/2016   K 3.8 02/23/2016 0645   K 4.0 02/19/2016 1221   CL 102 02/23/2016 0645   CO2 22 02/23/2016 0645   CO2 29 02/19/2016 1221   BUN 14 02/23/2016 0645   BUN 14.7 02/19/2016 1221   BUN 32* 02/04/2016   CREATININE 1.13* 02/23/2016 0645   CREATININE 1.2* 02/19/2016 1221   CREATININE 2.3* 02/04/2016   GLUCOSE 201* 02/23/2016 0645   GLUCOSE 146* 02/19/2016 1221   CALCIUM 7.3* 02/23/2016 0645   CALCIUM 7.9* 02/19/2016 1221   AST 56* 02/23/2016 0645   AST 30 02/19/2016 1221   ALT 42 02/23/2016 0645   ALT 37 02/19/2016 1221   ALKPHOS 116 02/23/2016 0645   ALKPHOS 143 02/19/2016 1221   BILITOT 0.3 02/23/2016 0645   BILITOT 0.38 02/19/2016 1221   PROT 5.2* 02/23/2016 0645   PROT 6.1* 02/19/2016 1221   ALBUMIN 1.5* 02/23/2016 0645   ALBUMIN 1.6* 02/19/2016 1221     Time In: 1400 Time Out: 1500 Time Total: 60 min  Greater than 50%  of this time was spent  counseling and coordinating care related to the above assessment and plan.  Signed by: Loistine Chance, MD [place], MD  02/23/2016, 3:59 PM  Please contact Palliative Medicine Team phone at [phone] for questions and concerns.

## 2016-02-23 NOTE — ED Notes (Signed)
Bed: WA04 Expected date:  Expected time:  Means of arrival:  Comments: EMS 64yo [place] pt / diff breathing / hypoglycemia

## 2016-02-23 NOTE — ED Notes (Signed)
Patient transported to CT 

## 2016-02-23 NOTE — ED Notes (Signed)
Pt placed on bedpan

## 2016-02-23 NOTE — ED Notes (Signed)
Pt urinated. Sample unable to be used due to stool in the sample.

## 2016-02-23 NOTE — ED Notes (Signed)
Hospitalist at bedside 

## 2016-02-23 NOTE — Procedures (Signed)
Ultrasound-guided diagnostic and therapeutic right thoracentesis performed yielding 850 cc of yellow  fluid. No immediate complications. Follow-up chest x-ray pending. The fluid was sent to the lab for preordered studies.

## 2016-02-23 NOTE — ED Notes (Addendum)
BIB EMS from home. Pt has a hx of diabetes and uterine cancer. Pt has a recent hx of pneumonia (finished antibiotics yesterday). Pt was released from [facility] inpatient yesterday as well. EMS reports pt was altered when they arrived with a sugar of 21. D50 given, sugar is now 276 and pt is alert and oriented. EMS noted rhonchi in all 4 fields.  EMS vitals BP153/87 Pulse 120, 100% on 2L, sinus tach with EMS.

## 2016-02-24 DIAGNOSIS — J948 Other specified pleural conditions: Secondary | ICD-10-CM

## 2016-02-24 DIAGNOSIS — R05 Cough: Secondary | ICD-10-CM

## 2016-02-24 DIAGNOSIS — E43 Unspecified severe protein-calorie malnutrition: Secondary | ICD-10-CM

## 2016-02-24 DIAGNOSIS — R188 Other ascites: Secondary | ICD-10-CM

## 2016-02-24 DIAGNOSIS — E538 Deficiency of other specified B group vitamins: Secondary | ICD-10-CM | POA: Insufficient documentation

## 2016-02-24 DIAGNOSIS — D6481 Anemia due to antineoplastic chemotherapy: Secondary | ICD-10-CM

## 2016-02-24 DIAGNOSIS — D649 Anemia, unspecified: Secondary | ICD-10-CM

## 2016-02-24 DIAGNOSIS — C541 Malignant neoplasm of endometrium: Secondary | ICD-10-CM

## 2016-02-24 DIAGNOSIS — T451X5A Adverse effect of antineoplastic and immunosuppressive drugs, initial encounter: Secondary | ICD-10-CM

## 2016-02-24 DIAGNOSIS — D638 Anemia in other chronic diseases classified elsewhere: Secondary | ICD-10-CM

## 2016-02-24 DIAGNOSIS — Z9889 Other specified postprocedural states: Secondary | ICD-10-CM | POA: Insufficient documentation

## 2016-02-24 DIAGNOSIS — E11649 Type 2 diabetes mellitus with hypoglycemia without coma: Secondary | ICD-10-CM

## 2016-02-24 DIAGNOSIS — N289 Disorder of kidney and ureter, unspecified: Secondary | ICD-10-CM

## 2016-02-24 DIAGNOSIS — E46 Unspecified protein-calorie malnutrition: Secondary | ICD-10-CM

## 2016-02-24 DIAGNOSIS — R180 Malignant ascites: Secondary | ICD-10-CM

## 2016-02-24 LAB — CBC WITH DIFFERENTIAL/PLATELET
BASOS ABS: 0 10*3/uL (ref 0.0–0.1)
Basophils Relative: 0 %
EOS PCT: 5 %
Eosinophils Absolute: 0.8 10*3/uL — ABNORMAL HIGH (ref 0.0–0.7)
HEMATOCRIT: 23 % — AB (ref 36.0–46.0)
HEMOGLOBIN: 7.5 g/dL — AB (ref 12.0–15.0)
LYMPHS PCT: 8 %
Lymphs Abs: 1.4 10*3/uL (ref 0.7–4.0)
MCH: 27.8 pg (ref 26.0–34.0)
MCHC: 32.6 g/dL (ref 30.0–36.0)
MCV: 85.2 fL (ref 78.0–100.0)
MONOS PCT: 8 %
Monocytes Absolute: 1.4 10*3/uL — ABNORMAL HIGH (ref 0.1–1.0)
NEUTROS PCT: 79 %
Neutro Abs: 13.3 10*3/uL — ABNORMAL HIGH (ref 1.7–7.7)
Platelets: 149 10*3/uL — ABNORMAL LOW (ref 150–400)
RBC: 2.7 MIL/uL — AB (ref 3.87–5.11)
RDW: 16 % — AB (ref 11.5–15.5)
WBC: 16.9 10*3/uL — AB (ref 4.0–10.5)

## 2016-02-24 LAB — GLUCOSE, CAPILLARY
GLUCOSE-CAPILLARY: 81 mg/dL (ref 65–99)
GLUCOSE-CAPILLARY: 57 mg/dL — AB (ref 65–99)
GLUCOSE-CAPILLARY: 96 mg/dL (ref 65–99)
GLUCOSE-CAPILLARY: 224 mg/dL — AB (ref 65–99)
Glucose-Capillary: 106 mg/dL — ABNORMAL HIGH (ref 65–99)
Glucose-Capillary: 196 mg/dL — ABNORMAL HIGH (ref 65–99)

## 2016-02-24 LAB — BASIC METABOLIC PANEL
ANION GAP: 10 (ref 5–15)
BUN: 14 mg/dL (ref 6–20)
CALCIUM: 7.2 mg/dL — AB (ref 8.9–10.3)
CHLORIDE: 102 mmol/L (ref 101–111)
CO2: 24 mmol/L (ref 22–32)
CREATININE: 1.16 mg/dL — AB (ref 0.44–1.00)
GFR calc non Af Amer: 48 mL/min — ABNORMAL LOW (ref >60)
GFR, EST AFRICAN AMERICAN: 56 mL/min — AB (ref >60)
Glucose, Bld: 58 mg/dL — ABNORMAL LOW (ref 65–99)
POTASSIUM: 3.8 mmol/L (ref 3.5–5.1)
Sodium: 136 mmol/L (ref 135–145)

## 2016-02-24 LAB — CBC
HEMATOCRIT: 26.2 % — AB (ref 36.0–46.0)
HEMOGLOBIN: 8.7 g/dL — AB (ref 12.0–15.0)
MCH: 27.6 pg (ref 26.0–34.0)
MCHC: 33.2 g/dL (ref 30.0–36.0)
MCV: 83.2 fL (ref 78.0–100.0)
Platelets: 140 10*3/uL — ABNORMAL LOW (ref 150–400)
RBC: 3.15 MIL/uL — ABNORMAL LOW (ref 3.87–5.11)
RDW: 17.8 % — ABNORMAL HIGH (ref 11.5–15.5)
WBC: 18.2 10*3/uL — ABNORMAL HIGH (ref 4.0–10.5)

## 2016-02-24 LAB — PREPARE RBC (CROSSMATCH)

## 2016-02-24 MED ORDER — CYANOCOBALAMIN 1000 MCG/ML IJ SOLN
1000.0000 ug | Freq: Once | INTRAMUSCULAR | Status: AC
  Administered 2016-02-24: 1000 ug via INTRAMUSCULAR
  Filled 2016-02-24: qty 1

## 2016-02-24 MED ORDER — GLUCERNA SHAKE PO LIQD
237.0000 mL | Freq: Three times a day (TID) | ORAL | Status: DC
  Administered 2016-02-25 – 2016-02-26 (×2): 237 mL via ORAL
  Filled 2016-02-24 (×8): qty 237

## 2016-02-24 MED ORDER — SODIUM CHLORIDE 0.9 % IV SOLN
Freq: Once | INTRAVENOUS | Status: DC
Start: 2016-02-24 — End: 2016-02-26

## 2016-02-24 MED ORDER — FUROSEMIDE 10 MG/ML IJ SOLN
20.0000 mg | Freq: Once | INTRAMUSCULAR | Status: AC
  Administered 2016-02-24: 20 mg via INTRAVENOUS
  Filled 2016-02-24: qty 2

## 2016-02-24 NOTE — Progress Notes (Signed)
MEDICAL ONCOLOGY February 24, 2016, 9:01 AM  Hospital day 2 Antibiotics: zosyn Chemotherapy: adriamycin x1 on 01-10-16  Outpatient Physicians: Brittany Schwartz/ Brittany Schwartz; Brittany Schwartz (PCP [facility]), Brittany Greenhouse, MD (endocrine, [facility]), Brittany Schwartz (PCP [facility]); Brittany Schwartz (in hospital previous admission); Brittany Schwartz   Appreciate notification by Dr Brittany Schwartz of admission 02-23-16, primarily for hypoglycemia likely related to oral diabetes meds with inadequate po intake, in setting of other complications with advanced endometrial carcinoma.   EMR reviewed Right thoracentesis 3-24 for 850cc PRBCs x2 on 02-08-16 for hgb 6.0 Paracentesis 2.8 liters on 02-06-16 Urine 02-07-16  VRE (did not require treatment then) Last imaging of liver was CT AP 01-11-16, no liver mets then   I discussed by phone with Dr Brittany Schwartz day of admission, and on unit now.  Subjective:    Patient seen, together with daughter Brittany Schwartz. They report that patient had done better at home from time of my last office visit 02-19-16 until hypoglycemic 02-23-16; I do not know when last doses of oral hypoglycemics were given PTA. She had "diarrhea" on 3-21, which she thought was from Brittany Schwartz, so that she had stopped glucerna. She has had no further diarrhea, tho small stools every time she voids as has been the case. She had no fever at home, had just completed oral antibiotics for pneumonia following last admission 3-8 thru 02-15-16. She is feeling some better this AM, tho needed D10 infusion overnight. Breathing is more comfortable since right thoracentesis for 850 cc on 02-23-16. Minimal cough just in AMs now, not productive. Denies chest pain. Abdomen not uncomfortably distended. Bladder ok. No bleeding.   PAC in Flu vaccine 01-08-16 Left ureteral stent 01-13-16  ONCOLOGIC HISTORY  Patient presented to Dr Brittany Schwartz 01-03-15 with vaginal spotting x 1 year. Pelvic US showed 33 mm endometrial stripe  with ovaries normal, and endometrial biopsy 01-03-15 grade 3 endometrial adenocarcinoma. CT CAP 01-13-15 had negative chest, no adenopathy, mild fatty liver, markedly thickened endometrium and no evidence of metastatic disease outside of uterus. CA 125 preoperatively on 01-13-15 was 22.9. Surgery by Dr Brittany Schwartz 01-19-15 was robotic hysterectomy, BSO and resection of nodule at sigmoid serosa. At surgery there was milial tumor studding with 5-10 mm implants on bladder peritoneum and miliary disease on pelvic sidewalls, without upper abdominal disease evident, and omentum appeared normal. 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 Recommendation for 6 cycles of taxol carboplatin, then consideration of external beam RT + vaginal brachytherapy if CR. First carboplatin taxol was given 02-16-2015; she was neutropenic with ANC 0.7 by day 15 cycle 1. She was transfused 2 units PRBCs 5-19/5-20-16 for Hgb 7.8. Cycles 4 and 5 were both delayed due to thrombocytopenia. She completed chemotherapy 06-16-15 and vaginal brachytherapy30 gray in 5 fractions as of 09/14/2015. She did well thru end of 11-2015, including at gyn oncology exam 11-20-15 with CA 125 of 13 then. She presented to ED 12-29-15 with ~ 2 weeks progressive abdominal distension and pain, with large volume malignant ascites and large recurrent tumor burden. Cytology [id number] from ascites malignant cells consistent with gyn adenocarcinoma; CA 125 was up to 326 on 12-29-15. She was hospitalized thru 01-07-16, with paracentesis for 4 liters bloody fluid on 1-28, for 3.5 liters on 01-02-16 and 4 liters on 01-09-16. Chemo was held 01-08-16 due to concerning exam, with bilateral LE venous dopplers no evidence of DVT and CT  confirming large ascites (not appreciated on [place] 01-06-16) and bulky tumor without apparent bleed. Patient and family  were counseled on very aggressive and advanced recurrent malignancy, understood that any treatment would be in attempt to control disease but not potential cure. Discussion then about life support and advance directives, specifically that such measures would not improve the underlying malignancy; patient and family wanted to consider that information. She requested attempt at treatment, had first adriamycin on 01-10-16 She was receiving PRBCs on 01-11-16 when developed respiratory distress, intubated, supported in ICU with continuous dialysis and pressors. Left ureteral stent placed 01-13-16/ She was DC to SNF on 01-30-16. She had outpatient paracentesis for 2.8 liters on 02-06-16, then admitted on 02-07-16 with pneumonia and anemia. She was transfused 2 units PRBCs on 02-08-16 for hemoglobin 6.0.   Objective: Vital signs in last 24 hours: Blood pressure 121/65, pulse 115, temperature 99.5 F (37.5 C), temperature source Oral, resp. rate 20, height 5\' 1"  (1.549 m), weight 132 lb 7.9 oz (60.1 kg), SpO2 100 %. Awake, alert, appears more comfortable supine in bed now than at office last week.  O2 on, IVF infusing via PAC. Respirations not labored. PERRL. Oral mucosa moist without lesions. No JVD supine.Marland Kitchen PAC site unremarkable. Lungs without wheezes or rales ant/ lat. Heart RRR no gallop. Abdomen distended but not tight, quiet, not tender, seems more full RUQ to level of umbilicus. LE 1+ pedal edema, no clear cords or tenderness. Moves all extremities. Neuro speech fluent, no focal deficits on bed exam.  Intake/Output from previous day: 03/24 0701 - 03/25 0700 In: 1718.6 [P.O.:660; I.V.:706.5; IV Piggyback:352.1] Out: 401 [Urine:400; Stool:1] Intake/Output this shift:      Lab Results:  Recent Labs  02/23/16 0645 02/24/16 0545  WBC 17.4* 16.9*  HGB 7.9* 7.5*  HCT 24.3* 23.0*  PLT 136* 149*   BMET  Recent Labs  02/23/16 0645 02/24/16 0545  NA 133* 136  K 3.8 3.8  CL 102 102  CO2 22 24   GLUCOSE 201* 58*  BUN 14 14  CREATININE 1.13* 1.16*  CALCIUM 7.3* 7.2*    Studies/Results: Dg Chest 1 View  02/23/2016  CLINICAL DATA:  Status post right-sided thoracentesis. History of endometrial carcinoma EXAM: CHEST 1 VIEW COMPARISON:  Study obtained earlier in the day FINDINGS: There is no appreciable pneumothorax. There has been virtually complete resolution of right pleural effusion. Currently there is slight bibasilar atelectasis. Lungs elsewhere clear. Heart size and pulmonary vascularity are normal. No adenopathy. Port-A-Cath tip is in the superior vena cava. IMPRESSION: No demonstrable pneumothorax. Mild bibasilar atelectatic change. No change in cardiac silhouette. Electronically Signed   By: Lowella Grip III M.D.   On: 02/23/2016 10:16   Dg Chest 2 View  02/23/2016  CLINICAL DATA:  65 year old female with hypoglycemia. History of uterine cancer. Shortness of breath. EXAM: CHEST  2 VIEW COMPARISON:  Radiograph dated 02/12/2016 FINDINGS: Two views of the chest demonstrate a moderate size right pleural effusion, increased from prior study. There is associated compressive atelectasis of the right lung base. Superimposed pneumonia is not excluded. There is a small left pleural effusion. No pneumothorax identified. There is a stable cardiomegaly. Right pectoral Port-A-Cath in stable positioning. There is degenerative changes of the shoulders. No acute fracture. IMPRESSION: Moderate right and small left pleural effusions, increased from prior study. Electronically Signed   By: Anner Crete M.D.   On: 02/23/2016 02:34   US Thoracentesis Asp Pleural Space W/img Guide  02/23/2016  INDICATION: Endometrial carcinoma,  dyspnea, bilateral pleural effusions right greater than left. Request made for diagnostic and therapeutic right thoracentesis. EXAM: ULTRASOUND GUIDED DIAGNOSTIC AND THERAPEUTIC RIGHT THORACENTESIS MEDICATIONS: None. COMPLICATIONS: None immediate. PROCEDURE: An ultrasound  guided thoracentesis was thoroughly discussed with the patient and questions answered. The benefits, risks, alternatives and complications were also discussed. The patient understands and wishes to proceed with the procedure. Written consent was obtained. Ultrasound was performed to localize and mark an adequate pocket of fluid in the right chest. The area was then prepped and draped in the normal sterile fashion. 1% Lidocaine was used for local anesthesia. Under ultrasound guidance a Safe-T-Centesis catheter was introduced. Thoracentesis was performed. The catheter was removed and a dressing applied. FINDINGS: A total of approximately 850 cc of yellow fluid was removed. Fluid was submitted to the laboratory for preordered studies. IMPRESSION: Successful ultrasound guided diagnostic and therapeutic right thoracentesis yielding 850 cc of pleural fluid. Read by: Rowe Robert, PA-C Electronically Signed   By: Jacqulynn Cadet M.D.   On: 02/23/2016 10:07   MEDICATIONS She has mostly been off usual oral B12 x 3 months, this as she is lifelong vegetarian/ vegan. Will give IM B12 x1.   DISCUSSION Interval history discussed. .  Agree that she is not symptomatic enough from present ascites to require paracentesis, last done for 2.8 liters on 02-06-16. Even tho cytology on last pleural fluid did not find malignant cells, fluid is likely tracking up from ascites into pleural spaces. Peritoneal drain and/ or pleural drain for use prn at home may be helpful at some point.   Anemia is likely multifactorial with bloody malignant ascites, renal insufficiency, chronic disease, blood draws. Note she is lifelong vegetarian and had been on oral B12 in past, but has not taken this in likely 3 months. Suggest B12 injection x1 now. Iron ok 01-2016  Hypoglycemia may be related to oral agents + poor po intake, but if persists would image for possible disease progression in liver; last CMET 02-23-16 AST 56, rest of LFTs ok.  Encouraged patient to eat small amounts very regularly, and she is willing to try glucerna again as she previously tolerated that well  Patient is much happier at home than at [facility]. We have discussed multiple admissions and options for assistance at home, presently [facility] with HHPT. I explained that "palliative care" at home is usually only 1-2 visits per month. I have talked with them about greater nursing and other support available under Hospice, still with direct physician input as well as outpatient physician visits, and interventions as appropriate with Hospice including antibiotics, paracentesis/ thoracentesis, oxygen, sometimes transfusions.  We have discussed code status again. I have reminded them that just after extubation in ICU her code status was do not reintubate, however they do still want full code status now. Daughter shares that patient's husband is still very optimistic that she will improve, but she will share this information with him. If code status remains full code, I expect best will be to continue [facility] in preference to Hospice for now   Assessment/Plan: 1.recurrent high grade endometrial carcinoma presenting 12-2015 with large volume malignant ascites,  complicated course since then including acute respiratory decompensation and ARF in 01-2016 requiring ventilator support and CRRT initially. Situation not appropriate so far for additional chemo beyond first cycle adriamycin given 01-10-16, that in attempt to control disease.  Ascites is malignant from previous cytology evaluations.  If another paracentesis necessary for symptoms, would try to get peritoneal drain placed, to allow management  with small volume paracenteses out of hospital. Note large volume paracenteses in Feb contributed to ARF then. Patient and husband have wanted to pursue treatment rather than comfort care only; daughters have been more open to comfort care. This is not situation of potential cure. 2.hypoglycemia:  likely oral agents with inadequate po intake, tho ongoing since admission requiring D50 pushes and D10 infusion. If does not resolve would image liver, possibly with limited CT , to see if she has developed metastatic disease 3.anemia: multifactorial including bloody ascites previously, blood draws, chemo, renal insufficiency. Note she is lifelong vegan, had been on B12 supplements at least prior to all of this acute illness so will give B12 injection now 4.Acute renal failure 01-2016: creatinine has improved further since last admission. Known to Dr Jonnie Finner in hospital 5.left ureteral stent placed 01-13-16 6. Diabetes has been followed by Dr Brittany Schwartz with [facility] 6.protein calorie malnutrition, general marked deconditioning: encouraged supplements several times daily, home PT by [facility] involved. 7.PAC   Please page me if I can help between my rounds [phone] Appreciate Dr Janalyn Rouse help  LIVESAY,LENNIS P

## 2016-02-24 NOTE — Progress Notes (Signed)
PROGRESS NOTE    Brittany Schwartz  GYI:[id number]  DOB: 11/18/1951  DOA: 02/23/2016 PCP: [name], MD Outpatient Specialists: GYN Oncology: Dr. Rolley Sims course: 65 year old female, married, lives at home with spouse, PMH of recurrent high-grade endometrial carcinoma who initially presented in January 2017 with large volume malignant ascites, has a complicated course since then including acute respiratory failure & acute renal failure in February 2017 requiring ventilatory support and CRRT, has thus far received 1 cycle of Adriamycin 01/10/16 and not well enough to continue chemotherapy, malignant ascites status post large volume paracentesis in February that contributed to acute renal failure, large right pleural effusion status post thoracentesis 02/12/16-cytology negative, multifactorial anemia, DM with hypoglycemia, severe protein calorie malnutrition, recent hospitalization 02/07/16-02/15/16 for sepsis secondary to HCAP, discharged home after that admission, returns to ED on 02/23/16 due to altered mental status (confused and less responsive) and noted by EMS to be hypoglycemic with CBG of 26, some orthopnea and nonproductive cough. Recurrent hypoglycemia in ED-treating with IV D10 and when necessary D50, status post right therapeutic thoracentesis. Primary oncologist and palliative care consulted. Progressive general decline since cancer diagnosis with frequent hospitalization. S/P right therapeutic thoracentesis with improvement. Hypoglycemia starting to stabilize/improved.   Assessment & Plan:   DM type II with persistent hypoglycemia - Most likely secondary to poor oral intake and ongoing oral hypoglycemics. Also question of poor liver reserve (INR 02/07/16:1.66) - Discontinued oral hypoglycemic/glipizide indefinitely. - Monitoring CBGs closely and resuscitating with PRN IV D50, low-volume IV D10 infusion and regular diet. - Seems to be stabilizing-? Requiring less IV  D50 and improving with oral intake alone. Continue management.  Recurrent right >left pleural effusion - Previous thoracentesis 02/12/16 was cytology negative. - Status post ultrasound-guided therapeutic & diagnostic thoracentesis by IR on 3/24 yielding 850 mL of yellow fluid. Gram stain negative. Culture negative to date. Cytology pending. - If develops recurrent effusions, may need Pleurx placement. - Low index of suspicion for pneumonia-discontinued antibiotics 3/25. Chest x-ray and clinical improvement.  Recurrent malignant ascites - Has had prior large volume paracentesis contributing to acute renal failure in the past. - Currently not very symptomatic. She will eventually need catheter placement for frequent low-volume paracentesis which could be done at home.  Anemia - Secondary to malignancy and chronic disease. - Hemoglobin down to 7.5. As per discussion with Dr. Marko Plume, patient does well with hemoglobin >8 and hence recommended transfusing 1 unit of PRBC under Lasix cover and will receive vitamin B12 shot. Follow CBCs.  Thrombocytopenia - Stable.  Leukocytosis - chronic since mid February 2017.   Recurrent high-grade endometrial carcinoma - Thus far patient has been able to tolerate a single cycle of Adriamycin on 01/10/16 and does not well he left to continue. - Dr. Marko Plume input appreciated. Discussed with her 3/25.  Severe protein calorie malnutrition - Secondary to malignancy and poor oral intake. - Regular diet.  Anasarca - Secondary to hypoalbuminemia related to malignancy and poor oral intake. May consider low-dose Lasix.  Adult failure to thrive - Poor prognosis. Progressive decline since cancer diagnosis and January 2017. Discussed at length with patient's daughter at bedside and recommended palliative care consultation for goals of care which she is agreeable to. - Palliative care met with patient and family on 3/24. They wish to continue full code and aggressive  care.   Recently treated healthcare associated pneumonia - Completed outpatient antibiotics. DC all antibiotics. No clinical or radiological suspicion for recurrent pneumonia.  DVT prophylaxis: SCD's  Code Status: SCDs Family Communication: Discussed extensively with patient's daughter Ms Gretel Cantu at bedside. Updated care and answered questions. Disposition Plan: To be determined.? Home with hospice.   Consultants:  Interventional radiology  Medical oncology  Palliative care medicine  Procedures:  Ultrasound-guided right diagnostic and therapeutic thoracentesis that yielded 850 mL of pleural fluid  Antimicrobials:  IV Rocephin 1 dose 3/23  IV Zosyn 3/24 > 3/25   IV vancomycin 3/23 > 3/25   Subjective: Feels better. Dyspnea improved. Postthoracentesis cough controlled. Reported p.m. this morning. No abdominal pain. As per RN, CBGs stabilizing.  Objective: Filed Vitals:   02/23/16 1030 02/23/16 1158 02/23/16 2020 02/24/16 0534  BP: 137/92 132/68 100/58 121/65  Pulse: 122 124 127 115  Temp:  98.7 F (37.1 C) 97.8 F (36.6 C) 99.5 F (37.5 C)  TempSrc:  Oral Oral Oral  Resp:  20 20 20   Height:  5' 1"  (1.549 m)    Weight:  60.1 kg (132 lb 7.9 oz)    SpO2: 100% 99% 100% 100%    Intake/Output Summary (Last 24 hours) at 02/24/16 1306 Last data filed at 02/24/16 0900  Gross per 24 hour  Intake 1481.4 ml  Output    401 ml  Net 1080.4 ml   Filed Weights   02/23/16 1158  Weight: 60.1 kg (132 lb 7.9 oz)    Exam:  General exam: Pleasant middle-aged female, looks older than stated age, moderately built and frail, chronically ill-looking, lying comfortably supine in bed. Looks better than she did on 3/24 in the ED. Respiratory system: reduced breath sounds in the bases but improved breath sounds in the right base compared to on admission. Rest of lung fields clear to auscultation. No increased work of breathing. Cardiovascular system: S1 & S2 heard, RRR. No JVD,  murmurs, gallops, clicks. 1+ pitting bilateral leg edema. Gastrointestinal system: Abdomen is moderately distended, not tense, nontender, soft and normal bowel sounds heard. Irregular hard masses felt in the right quadrants and suprapubic region. Ascites + +. Central nervous system: Alert and oriented. No focal neurological deficits. Extremities: Symmetric 5 x 5 power.   Data Reviewed: Basic Metabolic Panel:  Recent Labs Lab 02/19/16 1221 02/23/16 0237 02/23/16 0645 02/24/16 0545  NA 139 137 133* 136  K 4.0 3.9 3.8 3.8  CL  --  101 102 102  CO2 29  --  22 24  GLUCOSE 146* 55* 201* 58*  BUN 14.7 16 14 14   CREATININE 1.2* 1.00 1.13* 1.16*  CALCIUM 7.9*  --  7.3* 7.2*   Liver Function Tests:  Recent Labs Lab 02/19/16 1221 02/23/16 0645  AST 30 56*  ALT 37 42  ALKPHOS 143 116  BILITOT 0.38 0.3  PROT 6.1* 5.2*  ALBUMIN 1.6* 1.5*   No results for input(s): LIPASE, AMYLASE in the last 168 hours. No results for input(s): AMMONIA in the last 168 hours. CBC:  Recent Labs Lab 02/19/16 1221 02/23/16 0159 02/23/16 0237 02/23/16 0645 02/24/16 0545  WBC 19.0* 19.1*  --  17.4* 16.9*  NEUTROABS 15.6* 15.8*  --  14.3* 13.3*  HGB 8.6* 8.3* 8.5* 7.9* 7.5*  HCT 27.7* 25.5* 25.0* 24.3* 23.0*  MCV 86.7 84.7  --  85.3 85.2  PLT 139* 153  --  136* 149*   Cardiac Enzymes: No results for input(s): CKTOTAL, CKMB, CKMBINDEX, TROPONINI in the last 168 hours. BNP (last 3 results) No results for input(s): PROBNP in the last 8760 hours. CBG:  Recent Labs Lab 02/23/16 2355 02/24/16  0227 02/24/16 0531 02/24/16 0726 02/24/16 1156  GLUCAP 88 81 57* 96 106*    Recent Results (from the past 240 hour(s))  Culture, body fluid-bottle     Status: None (Preliminary result)   Collection Time: 02/23/16 11:11 AM  Result Value Ref Range Status   Specimen Description FLUID PLEURAL  Final   Special Requests NONE  Final   Culture   Final    NO GROWTH < 24 HOURS Performed at [facility]    Report Status PENDING  Incomplete  Gram stain     Status: None   Collection Time: 02/23/16 11:11 AM  Result Value Ref Range Status   Specimen Description FLUID PLEURAL  Final   Special Requests NONE  Final   Gram Stain   Final    FEW WBC PRESENT,BOTH PMN AND MONONUCLEAR NO ORGANISMS SEEN Performed at [facility]    Report Status 02/23/2016 FINAL  Final         Studies: Dg Chest 1 View  02/23/2016  CLINICAL DATA:  Status post right-sided thoracentesis. History of endometrial carcinoma EXAM: CHEST 1 VIEW COMPARISON:  Study obtained earlier in the day FINDINGS: There is no appreciable pneumothorax. There has been virtually complete resolution of right pleural effusion. Currently there is slight bibasilar atelectasis. Lungs elsewhere clear. Heart size and pulmonary vascularity are normal. No adenopathy. Port-A-Cath tip is in the superior vena cava. IMPRESSION: No demonstrable pneumothorax. Mild bibasilar atelectatic change. No change in cardiac silhouette. Electronically Signed   By: Lowella Grip III M.D.   On: 02/23/2016 10:16   Dg Chest 2 View  02/23/2016  CLINICAL DATA:  65 year old female with hypoglycemia. History of uterine cancer. Shortness of breath. EXAM: CHEST  2 VIEW COMPARISON:  Radiograph dated 02/12/2016 FINDINGS: Two views of the chest demonstrate a moderate size right pleural effusion, increased from prior study. There is associated compressive atelectasis of the right lung base. Superimposed pneumonia is not excluded. There is a small left pleural effusion. No pneumothorax identified. There is a stable cardiomegaly. Right pectoral Port-A-Cath in stable positioning. There is degenerative changes of the shoulders. No acute fracture. IMPRESSION: Moderate right and small left pleural effusions, increased from prior study. Electronically Signed   By: Anner Crete M.D.   On: 02/23/2016 02:34   US Thoracentesis Asp Pleural Space W/img Guide  02/23/2016   INDICATION: Endometrial carcinoma, dyspnea, bilateral pleural effusions right greater than left. Request made for diagnostic and therapeutic right thoracentesis. EXAM: ULTRASOUND GUIDED DIAGNOSTIC AND THERAPEUTIC RIGHT THORACENTESIS MEDICATIONS: None. COMPLICATIONS: None immediate. PROCEDURE: An ultrasound guided thoracentesis was thoroughly discussed with the patient and questions answered. The benefits, risks, alternatives and complications were also discussed. The patient understands and wishes to proceed with the procedure. Written consent was obtained. Ultrasound was performed to localize and mark an adequate pocket of fluid in the right chest. The area was then prepped and draped in the normal sterile fashion. 1% Lidocaine was used for local anesthesia. Under ultrasound guidance a Safe-T-Centesis catheter was introduced. Thoracentesis was performed. The catheter was removed and a dressing applied. FINDINGS: A total of approximately 850 cc of yellow fluid was removed. Fluid was submitted to the laboratory for preordered studies. IMPRESSION: Successful ultrasound guided diagnostic and therapeutic right thoracentesis yielding 850 cc of pleural fluid. Read by: Rowe Robert, PA-C Electronically Signed   By: Jacqulynn Cadet M.D.   On: 02/23/2016 10:07        Scheduled Meds: . sodium chloride   Intravenous Once  .  feeding supplement (GLUCERNA SHAKE)  237 mL Oral TID BM  . furosemide  20 mg Intravenous Once  . latanoprost  1 drop Both Eyes QHS  . timolol  1 drop Both Eyes BID   Continuous Infusions: . dextrose 30 mL/hr at 02/24/16 0321    Active Problems:   Endometrial cancer ([place])   Anemia associated with chemotherapy   Malignant ascites   Hypoglycemia   Pleural effusion on right   Hypoglycemia associated with diabetes ([place])   SOB (shortness of breath)   Encounter for palliative care   Goals of care, counseling/discussion    Time spent: 25 minutes.    Vernell Leep, MD, FACP,  FHM. [facility] Pager [phone]  If 7PM-7AM, please contact night-coverage [email or web address] Password [email or web address] 02/24/2016, 1:06 PM    LOS: 1 day

## 2016-02-24 NOTE — Progress Notes (Signed)
Patient's CBG was 57 at 0545. Patient eat peanut butter crackers and drank a regular soda. Patient refused to have CBG rechecked within 15 mins and wanted it checked in an hour. CBG was 96 at 0726

## 2016-02-25 LAB — CBC
HEMATOCRIT: 27.3 % — AB (ref 36.0–46.0)
Hemoglobin: 9.1 g/dL — ABNORMAL LOW (ref 12.0–15.0)
MCH: 27.6 pg (ref 26.0–34.0)
MCHC: 33.3 g/dL (ref 30.0–36.0)
MCV: 82.7 fL (ref 78.0–100.0)
Platelets: 136 10*3/uL — ABNORMAL LOW (ref 150–400)
RBC: 3.3 MIL/uL — ABNORMAL LOW (ref 3.87–5.11)
RDW: 18.4 % — AB (ref 11.5–15.5)
WBC: 18.6 10*3/uL — ABNORMAL HIGH (ref 4.0–10.5)

## 2016-02-25 LAB — GLUCOSE, CAPILLARY
GLUCOSE-CAPILLARY: 131 mg/dL — AB (ref 65–99)
GLUCOSE-CAPILLARY: 158 mg/dL — AB (ref 65–99)
GLUCOSE-CAPILLARY: 207 mg/dL — AB (ref 65–99)
Glucose-Capillary: 154 mg/dL — ABNORMAL HIGH (ref 65–99)
Glucose-Capillary: 226 mg/dL — ABNORMAL HIGH (ref 65–99)

## 2016-02-25 LAB — BASIC METABOLIC PANEL
Anion gap: 9 (ref 5–15)
BUN: 13 mg/dL (ref 6–20)
CALCIUM: 7.4 mg/dL — AB (ref 8.9–10.3)
CO2: 24 mmol/L (ref 22–32)
CREATININE: 1.21 mg/dL — AB (ref 0.44–1.00)
Chloride: 102 mmol/L (ref 101–111)
GFR calc Af Amer: 53 mL/min — ABNORMAL LOW (ref >60)
GFR calc non Af Amer: 46 mL/min — ABNORMAL LOW (ref >60)
GLUCOSE: 193 mg/dL — AB (ref 65–99)
Potassium: 3.7 mmol/L (ref 3.5–5.1)
Sodium: 135 mmol/L (ref 135–145)

## 2016-02-25 LAB — PROCALCITONIN: Procalcitonin: 0.23 ng/mL

## 2016-02-25 NOTE — Evaluation (Signed)
Physical Therapy Evaluation Patient Details Name: Brittany Schwartz MRN: [id number] DOB: 18-Dec-1950 Today's Date: 02/25/2016   History of Present Illness  65 yo female admitted with wekaness; hx of endometrial cancer, malignant ascites, anemia, DM  Clinical Impression  Pt admitted with above diagnosis. Pt currently with functional limitations due to the deficits listed below (see PT Problem List).  Pt will benefit from skilled PT to increase their independence and safety with mobility to allow discharge to the venue listed below.  Incr HR with amb, see note below for details     Follow Up Recommendations Home health PT;Supervision/Assistance - 24 hour    Equipment Recommendations  Rolling walker with 5" wheels;3in1 (PT)    Recommendations for Other Services       Precautions / Restrictions Precautions Precautions: Fall Precaution Comments: monitor HR      Mobility  Bed Mobility Overal bed mobility: Needs Assistance Bed Mobility: Supine to Sit   Sidelying to sit: Supervision;HOB elevated       General bed mobility comments: incr time  Transfers Overall transfer level: Needs assistance Equipment used: Rolling walker (2 wheeled) Transfers: Sit to/from Stand Sit to Stand: Min assist         General transfer comment: Assist to rise, stabilize, control descent. VCs safety, technique, hand placement    Ambulation/Gait Ambulation/Gait assistance: Min assist Ambulation Distance (Feet): 55 Feet Assistive device: Rolling walker (2 wheeled)       General Gait Details: assisted with balance during turns. HR up to 134 after 25', had pt take standing rest for HR to come down, then returned to room; HR 116 after 2 min seated rest  Stairs            Wheelchair Mobility    Modified Rankin (Stroke Patients Only)       Balance Overall balance assessment: Needs assistance Sitting-balance support: No upper extremity supported Sitting balance-Leahy Scale: Fair      Standing balance support: No upper extremity supported Standing balance-Leahy Scale: Fair                               Pertinent Vitals/Pain Pain Assessment: No/denies pain    Home Living Family/patient expects to be discharged to:: Unsure Living Arrangements: Spouse/significant other;Children;Other relatives Available Help at Discharge: Family Type of Home: Apartment Home Access: Level entry     Home Layout: One level Home Equipment: Walker - 2 wheels;Cane - single point;Bedside commode Additional Comments: pt lives with dtrs and spouse, states she will have assist as needed    Prior Function Level of Independence: Independent               Hand Dominance        Extremity/Trunk Assessment   Upper Extremity Assessment: Generalized weakness           Lower Extremity Assessment: Generalized weakness (grossly 3 to 3+/5)      Cervical / Trunk Assessment: Normal  Communication   Communication: No difficulties  Cognition Arousal/Alertness: Awake/alert Behavior During Therapy: WFL for tasks assessed/performed Overall Cognitive Status: Within Functional Limits for tasks assessed                      General Comments      Exercises        Assessment/Plan    PT Assessment Patient needs continued PT services  PT Diagnosis Difficulty walking;Generalized weakness   PT Problem List Decreased strength;Decreased  activity tolerance;Decreased balance;Decreased mobility;Decreased knowledge of use of DME  PT Treatment Interventions DME instruction;Gait training;Functional mobility training;Therapeutic activities;Patient/family education;Balance training;Therapeutic exercise   PT Goals (Current goals can be found in the Care Plan section) Acute Rehab PT Goals Patient Stated Goal: to get stronger PT Goal Formulation: With patient/family Time For Goal Achievement: 03/01/16 Potential to Achieve Goals: Fair    Frequency Min 3X/week    Barriers to discharge        Co-evaluation               End of Session   Activity Tolerance: Patient tolerated treatment well Patient left: with call bell/phone within reach;with chair alarm set;with family/visitor present;in chair           Time: [id number] PT Time Calculation (min) (ACUTE ONLY): 24 min   Charges:   PT Evaluation $PT Eval Moderate Complexity: 1 Procedure PT Treatments $Gait Training: 8-22 mins   PT G Codes:        Randi Poullard March 17, 2016, 1:35 PM

## 2016-02-25 NOTE — Progress Notes (Signed)
PROGRESS NOTE    Brittany Schwartz  AJO:[id number]  DOB: 1951/07/13  DOA: 02/23/2016 PCP: [name], MD Outpatient Specialists: GYN Oncology: Dr. Rolley Sims course: 65 year old female, married, lives at home with spouse, PMH of recurrent high-grade endometrial carcinoma who initially presented in January 2017 with large volume malignant ascites, has a complicated course since then including acute respiratory failure & acute renal failure in February 2017 requiring ventilatory support and CRRT, has thus far received 1 cycle of Adriamycin 01/10/16 and not well enough to continue chemotherapy, malignant ascites status post large volume paracentesis in February that contributed to acute renal failure, large right pleural effusion status post thoracentesis 02/12/16-cytology negative, multifactorial anemia, DM with hypoglycemia, severe protein calorie malnutrition, recent hospitalization 02/07/16-02/15/16 for sepsis secondary to HCAP, discharged home after that admission, returns to ED on 02/23/16 due to altered mental status (confused and less responsive) and noted by EMS to be hypoglycemic with CBG of 26, some orthopnea and nonproductive cough. Recurrent hypoglycemia in ED-treating with IV D10 and when necessary D50, status post right therapeutic thoracentesis. Primary oncologist and palliative care consulted. Progressive general decline since cancer diagnosis with frequent hospitalization. S/P right therapeutic thoracentesis with improvement. Hypoglycemia starting to stabilize/improved.   Assessment & Plan:   DM type II with persistent hypoglycemia - Most likely secondary to poor oral intake and ongoing oral hypoglycemics. Also question of poor liver reserve (INR 02/07/16:1.66) - Discontinued oral hypoglycemic/glipizide indefinitely. - Monitoring CBGs closely and resuscitating with PRN IV D50, low-volume IV D10 infusion and regular diet. - Hypoglycemia resolved this morning. Has not  required IV D50. DC IV D10 infusion. Monitor CBGs closely for the next 24 hours and if no further hypoglycemic episodes, then discharge home 3/27.  Recurrent right >left pleural effusion - Previous thoracentesis 02/12/16 was cytology negative. - Status post ultrasound-guided therapeutic & diagnostic thoracentesis by IR on 3/24 yielding 850 mL of yellow fluid. Gram stain negative. Culture negative to date. Cytology pending. - If develops recurrent effusions, may need Pleurx placement. - Low index of suspicion for pneumonia-discontinued antibiotics 3/25. Chest x-ray and clinical improvement.  Recurrent malignant ascites - Has had prior large volume paracentesis contributing to acute renal failure in the past. - Currently not very symptomatic. She will eventually need catheter placement for frequent low-volume paracentesis which could be done at home.  Anemia - Secondary to malignancy and chronic disease. - Hemoglobin down to 7.5 on 3/25. As discussed with Dr. Marko Plume, patient does well with hemoglobin >8 and hence recommended transfusing 1 unit of PRBC under Lasix cover and will receive vitamin B12 shot.  - Hemoglobin improved to 9.1 after 1 unit PRBCs.  Thrombocytopenia - Stable.  Leukocytosis - chronic since mid February 2017.   Recurrent high-grade endometrial carcinoma - Thus far patient has been able to tolerate a single cycle of Adriamycin on 01/10/16 and does not well he left to continue. - Dr. Marko Plume input appreciated. Discussed with her 3/25.  Severe protein calorie malnutrition - Secondary to malignancy and poor oral intake. - Regular diet.  Anasarca - Secondary to hypoalbuminemia related to malignancy and poor oral intake. May consider low-dose Lasix.  Adult failure to thrive - Poor prognosis. Progressive decline since cancer diagnosis and January 2017. Discussed at length with patient's daughter at bedside and recommended palliative care consultation for goals of care which  she is agreeable to. - Palliative care met with patient and family on 3/24. They wish to continue full code and aggressive care.  Recently treated healthcare associated pneumonia - Completed outpatient antibiotics. DC all antibiotics. No clinical or radiological suspicion for recurrent pneumonia.  Mild acute kidney injury - May be precipitated by a small dose of IV Lasix 20 mg that was given prior to blood transfusion on 3/25. Follow BMP in a.m.  DVT prophylaxis: SCD's Code Status: SCDs Family Communication: Discussed extensively with patient's daughter Ms Brittany Schwartz at bedside. Updated care and answered questions. Disposition Plan: DC home possibly 3/27.   Consultants:  Interventional radiology  Medical oncology  Palliative care medicine  Procedures:  Ultrasound-guided right diagnostic and therapeutic thoracentesis that yielded 850 mL of pleural fluid  Antimicrobials:  IV Rocephin 1 dose 3/23  IV Zosyn 3/24 > 3/25   IV vancomycin 3/23 > 3/25   Subjective: Continues to feel better. Tolerating diet. Having BM. No dyspnea or cough. No abdominal pain. CBGs in the 200s while on D10 infusion-discontinued this morning.  Objective: Filed Vitals:   02/24/16 1839 02/24/16 2153 02/25/16 0614 02/25/16 0652  BP: 134/63 124/66 135/87   Pulse: 104 108 103   Temp: 98.6 F (37 C) 98.3 F (36.8 C) 98.7 F (37.1 C)   TempSrc: Oral Oral Oral   Resp: 20 18 16    Height:      Weight:    63.504 kg (140 lb)  SpO2: 98% 100% 100%     Intake/Output Summary (Last 24 hours) at 02/25/16 1303 Last data filed at 02/25/16 0615  Gross per 24 hour  Intake 1317.2 ml  Output      0 ml  Net 1317.2 ml   Filed Weights   02/23/16 1158 02/25/16 0652  Weight: 60.1 kg (132 lb 7.9 oz) 63.504 kg (140 lb)    Exam:  General exam: Pleasant middle-aged female, looks older than stated age, moderately built and frail, chronically ill-looking, lying comfortably supine in bed. Looks better than she  did on 3/24 in the ED. Respiratory system: reduced breath sounds in the bases but improved breath sounds in the right base compared to on admission. Rest of lung fields clear to auscultation. No increased work of breathing. Cardiovascular system: S1 & S2 heard, RRR. No JVD, murmurs, gallops, clicks. 1+ pitting bilateral leg edema. Gastrointestinal system: Abdomen is moderately distended, not tense, nontender, soft and normal bowel sounds heard. Irregular hard masses felt in the right quadrants and suprapubic region. Ascites + +. Central nervous system: Alert and oriented. No focal neurological deficits. Extremities: Symmetric 5 x 5 power.   Data Reviewed: Basic Metabolic Panel:  Recent Labs Lab 02/19/16 1221 02/23/16 0237 02/23/16 0645 02/24/16 0545 02/25/16 0300  NA 139 137 133* 136 135  K 4.0 3.9 3.8 3.8 3.7  CL  --  101 102 102 102  CO2 29  --  22 24 24   GLUCOSE 146* 55* 201* 58* 193*  BUN 14.7 16 14 14 13   CREATININE 1.2* 1.00 1.13* 1.16* 1.21*  CALCIUM 7.9*  --  7.3* 7.2* 7.4*   Liver Function Tests:  Recent Labs Lab 02/19/16 1221 02/23/16 0645  AST 30 56*  ALT 37 42  ALKPHOS 143 116  BILITOT 0.38 0.3  PROT 6.1* 5.2*  ALBUMIN 1.6* 1.5*   No results for input(s): LIPASE, AMYLASE in the last 168 hours. No results for input(s): AMMONIA in the last 168 hours. CBC:  Recent Labs Lab 02/19/16 1221  02/23/16 0159 02/23/16 0237 02/23/16 0645 02/24/16 0545 02/24/16 2010 02/25/16 0300  WBC 19.0*  --  19.1*  --  17.4* 16.9*  18.2* 18.6*  NEUTROABS 15.6*  --  15.8*  --  14.3* 13.3*  --   --   HGB 8.6*  < > 8.3* 8.5* 7.9* 7.5* 8.7* 9.1*  HCT 27.7*  < > 25.5* 25.0* 24.3* 23.0* 26.2* 27.3*  MCV 86.7  --  84.7  --  85.3 85.2 83.2 82.7  PLT 139*  --  153  --  136* 149* 140* 136*  < > = values in this interval not displayed. Cardiac Enzymes: No results for input(s): CKTOTAL, CKMB, CKMBINDEX, TROPONINI in the last 168 hours. BNP (last 3 results) No results for input(s):  PROBNP in the last 8760 hours. CBG:  Recent Labs Lab 02/24/16 1634 02/24/16 2010 02/24/16 2356 02/25/16 0740 02/25/16 1210  GLUCAP 196* 224* 226* 154* 131*    Recent Results (from the past 240 hour(s))  Culture, body fluid-bottle     Status: None (Preliminary result)   Collection Time: 02/23/16 11:11 AM  Result Value Ref Range Status   Specimen Description FLUID PLEURAL  Final   Special Requests NONE  Final   Culture   Final    NO GROWTH < 24 HOURS Performed at [facility]    Report Status PENDING  Incomplete  Gram stain     Status: None   Collection Time: 02/23/16 11:11 AM  Result Value Ref Range Status   Specimen Description FLUID PLEURAL  Final   Special Requests NONE  Final   Gram Stain   Final    FEW WBC PRESENT,BOTH PMN AND MONONUCLEAR NO ORGANISMS SEEN Performed at [facility]    Report Status 02/23/2016 FINAL  Final         Studies: No results found.      Scheduled Meds: . sodium chloride   Intravenous Once  . feeding supplement (GLUCERNA SHAKE)  237 mL Oral TID BM  . latanoprost  1 drop Both Eyes QHS  . timolol  1 drop Both Eyes BID   Continuous Infusions:    Active Problems:   Endometrial cancer ([place])   Anemia associated with chemotherapy   Malignant ascites   Hypoglycemia   Pleural effusion on right   Hypoglycemia associated with diabetes (HCC)   SOB (shortness of breath)   Encounter for palliative care   Goals of care, counseling/discussion   B12 nutritional deficiency   S/P thoracentesis    Time spent: 15 minutes.    Vernell Leep, MD, FACP, FHM. [facility] Pager [phone]  If 7PM-7AM, please contact night-coverage [email or web address] Password [email or web address] 02/25/2016, 1:03 PM    LOS: 2 days

## 2016-02-26 LAB — CBC
HEMATOCRIT: 27.1 % — AB (ref 36.0–46.0)
HEMOGLOBIN: 9 g/dL — AB (ref 12.0–15.0)
MCH: 27.3 pg (ref 26.0–34.0)
MCHC: 33.2 g/dL (ref 30.0–36.0)
MCV: 82.1 fL (ref 78.0–100.0)
Platelets: 154 10*3/uL (ref 150–400)
RBC: 3.3 MIL/uL — ABNORMAL LOW (ref 3.87–5.11)
RDW: 18.3 % — ABNORMAL HIGH (ref 11.5–15.5)
WBC: 21.2 10*3/uL — AB (ref 4.0–10.5)

## 2016-02-26 LAB — TYPE AND SCREEN
ABO/RH(D): B POS
ANTIBODY SCREEN: NEGATIVE
UNIT DIVISION: 0

## 2016-02-26 LAB — GLUCOSE, CAPILLARY
Glucose-Capillary: 160 mg/dL — ABNORMAL HIGH (ref 65–99)
Glucose-Capillary: 174 mg/dL — ABNORMAL HIGH (ref 65–99)

## 2016-02-26 LAB — BASIC METABOLIC PANEL
ANION GAP: 10 (ref 5–15)
BUN: 15 mg/dL (ref 6–20)
CO2: 23 mmol/L (ref 22–32)
Calcium: 7.6 mg/dL — ABNORMAL LOW (ref 8.9–10.3)
Chloride: 104 mmol/L (ref 101–111)
Creatinine, Ser: 1.15 mg/dL — ABNORMAL HIGH (ref 0.44–1.00)
GFR calc Af Amer: 57 mL/min — ABNORMAL LOW (ref >60)
GFR, EST NON AFRICAN AMERICAN: 49 mL/min — AB (ref >60)
GLUCOSE: 145 mg/dL — AB (ref 65–99)
POTASSIUM: 3.8 mmol/L (ref 3.5–5.1)
Sodium: 137 mmol/L (ref 135–145)

## 2016-02-26 LAB — PH, BODY FLUID: PH, BODY FLUID: 7.6

## 2016-02-26 MED ORDER — HEPARIN SOD (PORK) LOCK FLUSH 100 UNIT/ML IV SOLN
500.0000 [IU] | Freq: Once | INTRAVENOUS | Status: AC
  Administered 2016-02-26: 500 [IU] via INTRAVENOUS
  Filled 2016-02-26: qty 5

## 2016-02-26 NOTE — Discharge Instructions (Signed)

## 2016-02-26 NOTE — Discharge Summary (Signed)
Physician Discharge Summary  Brittany Schwartz  RAQ:[id number]  DOB: Jul 31, 1951  DOA: 02/23/2016  PCP: [name], MD  Outpatient Specialists: GYN Oncology: Dr. Evlyn Clines Endocrinology: Dr. [name]  Admit date: 02/23/2016 Discharge date: 02/26/2016  Time spent: Greater than 30 minutes  Recommendations for Outpatient Follow-up:  1. Dr. [name], PCP in 1 week. 2. Dr. [name], as needed regarding follow-up and management of diabetes. 3. Dr. Evlyn Clines, Oncology: Patient and family indicate that they have a follow-up appointment on 02/29/16. Encouraged to keep up that appointment. Defer lab work (CBC & CMP) to her oncologist during follow-up. Please follow pleural fluid cytology & culture results that were sent from the hospital. 4. Home health PT, OT, RN, aide, rolling walker with 5 inch wheels and 3 n 1.  Discharge Diagnoses:  Active Problems:   Endometrial cancer ([place])   Anemia associated with chemotherapy   Malignant ascites   Hypoglycemia   Pleural effusion on right   Hypoglycemia associated with diabetes (HCC)   SOB (shortness of breath)   Encounter for palliative care   Goals of care, counseling/discussion   B12 nutritional deficiency   S/P thoracentesis   Discharge Condition: Improved & Stable  Diet recommendation: Heart healthy and diabetic diet.  Filed Weights   02/23/16 1158 02/25/16 0652  Weight: 60.1 kg (132 lb 7.9 oz) 63.504 kg (140 lb)    History of present illness:  65 year old female, married, lives at home with spouse, PMH of recurrent high-grade endometrial carcinoma who initially presented in January 2017 with large volume malignant ascites, has a complicated course since then including acute respiratory failure & acute renal failure in February 2017 requiring ventilatory support and CRRT, has thus far received 1 cycle of Adriamycin 01/10/16 and not well enough to continue chemotherapy, malignant ascites status post large volume  paracentesis in February that contributed to acute renal failure, large right pleural effusion status post thoracentesis 02/12/16-cytology negative, multifactorial anemia, DM with hypoglycemia, severe protein calorie malnutrition, recent hospitalization 02/07/16-02/15/16 for sepsis secondary to HCAP, discharged home after that admission, returns to ED on 02/23/16 due to altered mental status (confused and less responsive) and noted by EMS to be hypoglycemic with CBG of 26, some orthopnea and nonproductive cough. Recurrent hypoglycemia in ED-treating with IV D10 and when necessary D50, status post right therapeutic thoracentesis. Primary oncologist and palliative care consulted. Progressive general decline since cancer diagnosis with frequent hospitalization. S/P right therapeutic thoracentesis with improvement.   Hospital Course:   DM type II with persistent hypoglycemia - Most likely secondary to poor oral intake and ongoing oral hypoglycemics prior to admission. Also question of poor liver reserve (INR 02/07/16:1.66) - Discontinued oral hypoglycemic/glipizide indefinitely. - She was treated aggressively with frequent CBG monitoring, required frequent doses of IV D50, IV D10 infusion along with oral intake. After approximately 24-36 hours, her blood glucose stabilized. Her CBGs are now ranging between 131-207. Will not discharge her on any hypoglycemics unless her CBGs as outpatient are consistently elevated greater than 200-250, she has been advised to follow-up with her primary endocrinologist regarding same at that time for further management. She indicates that she is been a diabetic for 15+ years and knows how to control her diet. She had been taking increased carbohydrate/sweets to counter the hypoglycemia but now that her CBGs have stabilized, she plans to return to a diabetic diet.  Recurrent right >left pleural effusion - Previous thoracentesis 02/12/16 was cytology negative. - Status post  ultrasound-guided therapeutic & diagnostic thoracentesis by  IR on 3/24 yielding 850 mL of yellow fluid. Gram stain negative. Culture negative to date. Cytology pending. - If develops recurrent effusions, may need Pleurx placement. - Low index of suspicion for pneumonia-discontinued antibiotics 3/25. Chest x-ray and clinical improvement.  Recurrent malignant ascites - Has had prior large volume paracentesis contributing to acute renal failure in the past. - Currently not very symptomatic. She will eventually need catheter placement for frequent low-volume paracentesis which could be done at home.  Anemia - Secondary to malignancy and chronic disease. - Hemoglobin down to 7.5 on 3/25. As discussed with Dr. Marko Plume, patient does well with hemoglobin >8 and hence recommended transfusing 1 unit of PRBC under Lasix cover and will receive vitamin B12 shot.  - Hemoglobin improved to 9.1 after 1 unit PRBCs. Stable.  Thrombocytopenia - Stable.  Leukocytosis - chronic since mid February 2017. Unclear etiology. No clinical suspicion for infection.  Recurrent high-grade endometrial carcinoma - Thus far patient has been able to tolerate a single cycle of Adriamycin on 01/10/16 and does not well he left to continue. - Dr. Marko Plume input appreciated. Discussed with her 3/25.  Severe protein calorie malnutrition - Secondary to malignancy and poor oral intake. - Regular diet.  Anasarca - Secondary to hypoalbuminemia related to malignancy and poor oral intake. May consider low-dose Lasix as outpatient..  Adult failure to thrive - Poor prognosis. Progressive decline since cancer diagnosis and January 2017. Discussed at length with patient's daughter at bedside and recommended palliative care consultation for goals of care which she is agreeable to. - Palliative care met with patient and family on 3/24. They wish to continue full code and aggressive care.   Recently treated healthcare associated  pneumonia - Completed outpatient antibiotics. DC all antibiotics. No clinical or radiological suspicion for recurrent pneumonia.  Mild acute kidney injury - May be precipitated by a small dose of IV Lasix 20 mg that was given prior to blood transfusion on 3/25. Resolved. Creatinine back to normal.  Discussed at length with patient's niece and son-in-law at bedside.   Consultants:  Interventional radiology  Medical oncology  Palliative care medicine  Procedures:  Ultrasound-guided right diagnostic and therapeutic thoracentesis that yielded 850 mL of pleural fluid  Antimicrobials:  IV Rocephin 1 dose 3/23  IV Zosyn 3/24 > 3/25   IV vancomycin 3/23 > 3/25  Discharge Exam:  Complaints: Denies complaints. Anxious to go home. No dyspnea, cough, abdominal pain, nausea or vomiting.  Filed Vitals:   02/25/16 0652 02/25/16 1300 02/25/16 2043 02/26/16 0439  BP:  130/73 128/65 138/72  Pulse:  100 115 120  Temp:  98.4 F (36.9 C) 98 F (36.7 C) 98.8 F (37.1 C)  TempSrc:  Oral Oral Oral  Resp:  _0 Height:      Weight: 63.504 kg (140 lb)     SpO2:  99% 100% 100%    General exam: Pleasant middle-aged female, looks older than stated age, moderately built and frail, chronically ill-looking, lying comfortably supine in bed.  Respiratory system: reduced breath sounds in the bases but improved breath sounds in the right base compared to on admission. Rest of lung fields clear to auscultation. No increased work of breathing. Cardiovascular system: S1 & S2 heard, RRR. No JVD, murmurs, gallops, clicks. 1+ pitting bilateral leg edema. Gastrointestinal system: Abdomen is moderately distended, not tense, nontender, soft and normal bowel sounds heard. Irregular hard masses felt in the right quadrants and suprapubic region. Ascites + +. Central nervous system: Alert  and oriented. No focal neurological deficits. Extremities: Symmetric 5 x 5 power.  Discharge Instructions       Discharge Instructions    Call MD for:  difficulty breathing, headache or visual disturbances    Complete by:  As directed      Call MD for:  extreme fatigue    Complete by:  As directed      Call MD for:  persistant dizziness or light-headedness    Complete by:  As directed      Call MD for:  persistant nausea and vomiting    Complete by:  As directed      Call MD for:  severe uncontrolled pain    Complete by:  As directed      Call MD for:  temperature >100.4    Complete by:  As directed      Diet - low sodium heart healthy    Complete by:  As directed      Diet Carb Modified    Complete by:  As directed      Increase activity slowly    Complete by:  As directed             Medication List    STOP taking these medications        glipiZIDE 2.5 MG 24 hr tablet  Commonly known as:  GLUCOTROL XL     guaiFENesin-dextromethorphan 100-10 MG/5ML syrup  Commonly known as:  ROBITUSSIN DM     levofloxacin 500 MG tablet  Commonly known as:  LEVAQUIN     LORazepam 0.5 MG tablet  Commonly known as:  ATIVAN     nystatin 100000 UNIT/ML suspension  Commonly known as:  MYCOSTATIN     ondansetron 8 MG tablet  Commonly known as:  ZOFRAN     oxyCODONE 5 MG immediate release tablet  Commonly known as:  ROXICODONE     pantoprazole 40 MG tablet  Commonly known as:  PROTONIX      TAKE these medications        acetaminophen 325 MG tablet  Commonly known as:  TYLENOL  Take 650 mg by mouth every 6 (six) hours as needed for mild pain, moderate pain or headache. Reported on 01/08/2016     albuterol (2.5 MG/3ML) 0.083% nebulizer solution  Commonly known as:  PROVENTIL  Take 3 mLs (2.5 mg total) by nebulization every 6 (six) hours as needed for wheezing or shortness of breath.     feeding supplement (GLUCERNA SHAKE) Liqd  Take 237 mLs by mouth 3 (three) times daily between meals.     ferrous fumarate 325 (106 Fe) MG Tabs tablet  Commonly known as:  HEMOCYTE - 106 mg FE  Take 1  tablet daily  on an empty stomach with OJ or vitamin C tablet     latanoprost 0.005 % ophthalmic solution  Commonly known as:  XALATAN  Place 1 drop into both eyes at bedtime.     lidocaine-prilocaine cream  Commonly known as:  EMLA  Apply to Porta-Cath 1-2 hrs prior to access as directed.     timolol 0.5 % ophthalmic solution  Commonly known as:  BETIMOL  Place 1 drop into both eyes 2 (two) times daily.       Follow-up Information    Follow up with [facility].   Contact information:   [address] [place] [address] [phone]       Follow up with [name], MD. Schedule an  appointment as soon as possible for a visit in 1 week.   Specialty:  Family Medicine   Contact information:   [phone] [place] [place] [facility] [place] [address] [phone]       Follow up with [name], MD On 02/29/2016.   Specialty:  Oncology   Why:  Keep previous appointment. Lab work (CBC, CMP) as per discretion of oncologist.   Contact information:   [place] [address] [phone]       Schedule an appointment as soon as possible for a visit with Klasen,DHAVAL, MD.   Specialty:  Endocrinology   Why:  As needed, If symptoms worsen   Contact information:   [address] [address] Cataract reviewed and adjusted: Please take all your medications with you for your next visit with your Primary MD  Please request your Primary MD to go over all hospital tests and procedure/radiological results at the follow up. Please ask your Primary MD to get all Hospital records sent to his/her office.  If you experience worsening of your admission symptoms, develop shortness of breath, life threatening emergency, suicidal or homicidal thoughts you must seek medical attention immediately by calling 911 or calling your MD immediately if symptoms less  severe.  You must read complete instructions/literature along with all the possible adverse reactions/side effects for all the Medicines you take and that have been prescribed to you. Take any new Medicines after you have completely understood and accept all the possible adverse reactions/side effects.   Do not drive when taking pain medications.   Do not take more than prescribed Pain, Sleep and Anxiety Medications  Special Instructions: If you have smoked or chewed Tobacco in the last 2 yrs please stop smoking, stop any regular Alcohol and or any Recreational drug use.  Wear Seat belts while driving.  Please note  You were cared for by a hospitalist during your hospital stay. Once you are discharged, your primary care physician will handle any further medical issues. Please note that NO REFILLS for any discharge medications will be authorized once you are discharged, as it is imperative that you return to your primary care physician (or establish a relationship with a primary care physician if you do not have one) for your aftercare needs so that they can reassess your need for medications and monitor your lab values.    The results of significant diagnostics from this hospitalization (including imaging, microbiology, ancillary and laboratory) are listed below for reference.    Significant Diagnostic Studies: Dg Chest 1 View  02/23/2016  CLINICAL DATA:  Status post right-sided thoracentesis. History of endometrial carcinoma EXAM: CHEST 1 VIEW COMPARISON:  Study obtained earlier in the day FINDINGS: There is no appreciable pneumothorax. There has been virtually complete resolution of right pleural effusion. Currently there is slight bibasilar atelectasis. Lungs elsewhere clear. Heart size and pulmonary vascularity are normal. No adenopathy. Port-A-Cath tip is in the superior vena cava. IMPRESSION: No demonstrable pneumothorax. Mild bibasilar atelectatic change. No change in cardiac  silhouette. Electronically Signed   By: Lowella Grip III M.D.   On: 02/23/2016 10:16   Dg Chest 1 View  02/12/2016  CLINICAL DATA:  Post right thoracentesis. EXAM: CHEST 1 VIEW COMPARISON:  02/12/2016 at 7:56 a.m. FINDINGS: Right IJ Port-A-Cath unchanged. Lungs are hypoinflated with interval improvement in right-sided effusion with mild residual right base opacification likely combination  of minimal pleural fluid with atelectasis. No evidence of right-sided pneumothorax. Mild stable left base opacification likely layering small left effusion with atelectasis. Mild stable cardiomegaly. Remainder of the exam is unchanged. IMPRESSION: Post right thoracentesis with improved right effusion. Mild residual opacification in the right base likely small amount of pleural fluid with atelectasis. No pneumothorax. Stable left base opacification likely small effusion with atelectasis. Electronically Signed   By: Marin Olp M.D.   On: 02/12/2016 14:36   Dg Chest 2 View  02/23/2016  CLINICAL DATA:  65 year old female with hypoglycemia. History of uterine cancer. Shortness of breath. EXAM: CHEST  2 VIEW COMPARISON:  Radiograph dated 02/12/2016 FINDINGS: Two views of the chest demonstrate a moderate size right pleural effusion, increased from prior study. There is associated compressive atelectasis of the right lung base. Superimposed pneumonia is not excluded. There is a small left pleural effusion. No pneumothorax identified. There is a stable cardiomegaly. Right pectoral Port-A-Cath in stable positioning. There is degenerative changes of the shoulders. No acute fracture. IMPRESSION: Moderate right and small left pleural effusions, increased from prior study. Electronically Signed   By: Anner Crete M.D.   On: 02/23/2016 02:34   Dg Chest 2 View  02/12/2016  CLINICAL DATA:  Shortness breath. Pneumonia. Endometrial carcinoma. EXAM: CHEST  2 VIEW COMPARISON:  02/10/2016 FINDINGS: Moderate bilateral pleural  effusions are again seen with associated bibasilar atelectasis or consolidation. This shows no significant interval change. Power port remains in place. Heart size is stable. No pneumothorax visualized. IMPRESSION: No significant change in bilateral pleural effusions and bibasilar atelectasis or consolidation. Electronically Signed   By: Earle Gell M.D.   On: 02/12/2016 08:41   Dg Chest 2 View  02/10/2016  CLINICAL DATA:  65 year old female with a history of shortness of breath, pneumonia. EXAM: CHEST - 2 VIEW COMPARISON:  02/07/2016, 01/26/2016 FINDINGS: Cardiomediastinal silhouette likely unchanged though partially obscured by overlying lung and pleural disease. Low lung volumes bilaterally. Mixed interstitial airspace opacities at the bilateral lower lungs, obscuring the hemidiaphragm on the left and right and the heart borders. Lateral view demonstrates opacification of the costophrenic sulcus. Fullness in the central vasculature. Right IJ port catheter. No displaced fracture. Unremarkable appearance of the upper abdomen. IMPRESSION: Interval development of bilateral lower lung opacities, potentially combination of infection, edema, atelectasis, and bilateral pleural effusions. Signed, Dulcy Fanny. Earleen Newport, DO [facility] Electronically Signed   By: Corrie Mckusick D.O.   On: 02/10/2016 10:37   Dg Chest 2 View  02/07/2016  CLINICAL DATA:  Pneumonia, undergoing current treatment. EXAM: CHEST  2 VIEW COMPARISON:  01/26/2016 FINDINGS: Bibasilar airspace opacities are similar to prior study may reflect pneumonia. Small bilateral effusions. Right Port-A-Cath is in place with the tip in the SVC. Interval removal of left internal jugular central line. Heart is normal size. IMPRESSION: Bibasilar opacities concerning for pneumonia. Small effusions. No real change since prior study. Electronically Signed   By: Rolm Baptise M.D.   On: 02/07/2016 14:24   Dg Knee  1-2 Views Left  02/15/2016  CLINICAL DATA:  [place] today.  Left knee pain and buckling. EXAM: LEFT KNEE - 1-2 VIEW COMPARISON:  None. FINDINGS: The joint spaces are fairly well maintained. There are mild/early degenerative changes. No acute fracture or osteochondral lesion. IMPRESSION: No acute bony findings or joint effusion. Electronically Signed   By: Marijo Sanes M.D.   On: 02/15/2016 14:38   Dg Knee 1-2 Views Right  02/15/2016  CLINICAL  DATA:  Status post fall.  Knee buckling. EXAM: RIGHT KNEE - 1-2 VIEW COMPARISON:  None. FINDINGS: No fracture, dislocation, or joint effusion. IMPRESSION: No acute abnormality. Electronically Signed   By: Dorise Bullion III M.D   On: 02/15/2016 14:37   US Paracentesis  02/06/2016  INDICATION: Patient with history of high-grade serous endometrial carcinoma, recurrent ascites. Request made for therapeutic paracentesis. EXAM: ULTRASOUND GUIDED THERAPEUTIC PARACENTESIS MEDICATIONS: None. COMPLICATIONS: None immediate. PROCEDURE: Informed written consent was obtained from the patient after a discussion of the risks, benefits and alternatives to treatment. A timeout was performed prior to the initiation of the procedure. Initial ultrasound scanning demonstrates a small to moderate amount of ascites within the left lower abdominal quadrant. The left lower abdomen was prepped and draped in the usual sterile fashion. 1% lidocaine was used for local anesthesia. Following this, a Yueh catheter was introduced. An ultrasound image was saved for documentation purposes. The paracentesis was performed. The catheter was removed and a dressing was applied. The patient tolerated the procedure well without immediate post procedural complication. FINDINGS: A total of approximately 2.8 liters of yellow fluid was removed. IMPRESSION: Successful ultrasound-guided therapeutic paracentesis yielding 2.8 liters of peritoneal fluid. Read by: Rowe Robert, PA-C Electronically Signed   By: Sandi Mariscal  M.D.   On: 02/06/2016 12:21   US Thoracentesis Asp Pleural Space W/img Guide  02/23/2016  INDICATION: Endometrial carcinoma, dyspnea, bilateral pleural effusions right greater than left. Request made for diagnostic and therapeutic right thoracentesis. EXAM: ULTRASOUND GUIDED DIAGNOSTIC AND THERAPEUTIC RIGHT THORACENTESIS MEDICATIONS: None. COMPLICATIONS: None immediate. PROCEDURE: An ultrasound guided thoracentesis was thoroughly discussed with the patient and questions answered. The benefits, risks, alternatives and complications were also discussed. The patient understands and wishes to proceed with the procedure. Written consent was obtained. Ultrasound was performed to localize and mark an adequate pocket of fluid in the right chest. The area was then prepped and draped in the normal sterile fashion. 1% Lidocaine was used for local anesthesia. Under ultrasound guidance a Safe-T-Centesis catheter was introduced. Thoracentesis was performed. The catheter was removed and a dressing applied. FINDINGS: A total of approximately 850 cc of yellow fluid was removed. Fluid was submitted to the laboratory for preordered studies. IMPRESSION: Successful ultrasound guided diagnostic and therapeutic right thoracentesis yielding 850 cc of pleural fluid. Read by: Rowe Robert, PA-C Electronically Signed   By: Jacqulynn Cadet M.D.   On: 02/23/2016 10:07   US Thoracentesis Asp Pleural Space W/img Guide  02/12/2016  INDICATION: Endometrial cancer, pneumonia, dyspnea, bilateral pleural effusions; request is made for diagnostic and therapeutic right thoracentesis. EXAM: ULTRASOUND GUIDED DIAGNOSTIC AND THERAPEUTIC RIGHT THORACENTESIS MEDICATIONS: None. COMPLICATIONS: None immediate. PROCEDURE: An ultrasound guided thoracentesis was thoroughly discussed with the patient and questions answered. The benefits, risks, alternatives and complications were also discussed. The patient understands and wishes to proceed with the  procedure. Written consent was obtained. Ultrasound was performed to localize and mark an adequate pocket of fluid in the right chest. The area was then prepped and draped in the normal sterile fashion. 1% Lidocaine was used for local anesthesia. Under ultrasound guidance a Safe-T-Centesis catheter was introduced. Thoracentesis was performed. The catheter was removed and a dressing applied. FINDINGS: A total of approximately 700 cc of slightly hazy, yellow fluid was removed. Samples were sent to the laboratory as requested by the clinical team. IMPRESSION: Successful ultrasound guided diagnostic and therapeutic right thoracentesis yielding 700 cc of pleural fluid. Read by: Rowe Robert, PA-C Electronically Signed  By: Eugenie Filler M.D.   On: 02/12/2016 14:44    Microbiology: Recent Results (from the past 240 hour(s))  Culture, body fluid-bottle     Status: None (Preliminary result)   Collection Time: 02/23/16 11:11 AM  Result Value Ref Range Status   Specimen Description FLUID PLEURAL  Final   Special Requests NONE  Final   Culture   Final    NO GROWTH 3 DAYS Performed at [facility]    Report Status PENDING  Incomplete  Gram stain     Status: None   Collection Time: 02/23/16 11:11 AM  Result Value Ref Range Status   Specimen Description FLUID PLEURAL  Final   Special Requests NONE  Final   Gram Stain   Final    FEW WBC PRESENT,BOTH PMN AND MONONUCLEAR NO ORGANISMS SEEN Performed at [facility]    Report Status 02/23/2016 FINAL  Final     Labs: Basic Metabolic Panel:  Recent Labs Lab 02/19/16 1221 02/23/16 0237 02/23/16 0645 02/24/16 0545 02/25/16 0300 02/26/16 0440  NA 139 137 133* 136 135 137  K 4.0 3.9 3.8 3.8 3.7 3.8  CL  --  101 102 102 102 104  CO2 29  --  _0 GLUCOSE 146* 55* 201* 58* 193* 145*  BUN 14._1 CREATININE 1.2* 1.00 1.13* 1.16* 1.21* 1.15*  CALCIUM 7.9*  --  7.3* 7.2* 7.4* 7.6*   Liver Function Tests:  Recent  Labs Lab 02/19/16 1221 02/23/16 0645  AST 30 56*  ALT 37 42  ALKPHOS 143 116  BILITOT 0.38 0.3  PROT 6.1* 5.2*  ALBUMIN 1.6* 1.5*   No results for input(s): LIPASE, AMYLASE in the last 168 hours. No results for input(s): AMMONIA in the last 168 hours. CBC:  Recent Labs Lab 02/19/16 1221  02/23/16 0159  02/23/16 0645 02/24/16 0545 02/24/16 2010 02/25/16 0300 02/26/16 0440  WBC 19.0*  < > 19.1*  --  17.4* 16.9* 18.2* 18.6* 21.2*  NEUTROABS 15.6*  --  15.8*  --  14.3* 13.3*  --   --   --   HGB 8.6*  --  8.3*  < > 7.9* 7.5* 8.7* 9.1* 9.0*  HCT 27.7*  --  25.5*  < > 24.3* 23.0* 26.2* 27.3* 27.1*  MCV 86.7  < > 84.7  --  85.3 85.2 83.2 82.7 82.1  PLT 139*  < > 153  --  136* 149* 140* 136* 154  < > = values in this interval not displayed. Cardiac Enzymes: No results for input(s): CKTOTAL, CKMB, CKMBINDEX, TROPONINI in the last 168 hours. BNP: BNP (last 3 results)  Recent Labs  01/11/16 1653  BNP 341.7*    ProBNP (last 3 results) No results for input(s): PROBNP in the last 8760 hours.  CBG:  Recent Labs Lab 02/25/16 0740 02/25/16 1210 02/25/16 1556 02/25/16 2114 02/26/16 0747  GLUCAP 154* 131* 158* 207* 160*       Signed:  Vernell Leep, MD, FACP, FHM. [facility] Pager [phone]  If 7PM-7AM, please contact night-coverage [email or web address] Password [facility] 02/26/2016, 11:55 AM

## 2016-02-26 NOTE — Care Management Note (Signed)
Case Management Note  Patient Details  Name: Brittany Schwartz MRN: [id number] Date of Birth: Mar 15, 1951  Subjective/Objective:         65 yo admitted with Hypoglycemia           Action/Plan: From home with family and had [facility] for home PT/RN prior to admission.  Expected Discharge Date:   (unknown)               Expected Discharge Plan:  [place]  In-House Referral:     Discharge planning Services  CM Consult  Post Acute Care Choice:  Home Health, Resumption of Svcs/PTA Provider Choice offered to:  Patient, Adult Children  DME Arranged:    DME Agency:     HH Arranged:  RN, OT, PT, Nurse's Aide [place] Agency:  [place]  Status of Service:  In process, will continue to follow  Medicare Important Message Given:    Date Medicare IM Given:    Medicare IM give by:    Date Additional Medicare IM Given:    Additional Medicare Important Message give by:     If discussed at [place] of Stay Meetings, dates discussed:    Additional Comments: Pt and family would like pt to continue with [facility] for [facility] services when discharged home.  Family would like to add OT/Aide to current services.  MD orders received and [facility] rep contacted for resumption referral.  Pt has RW and 3 in 1 already at home.  No other CM needs communicated. Lynnell Catalan, RN 02/26/2016, 10:23 AM [phone]

## 2016-02-26 NOTE — Progress Notes (Signed)
MEDICAL ONCOLOGY February 26, 2016, 8:17 AM  Hospital day 4 Antibiotics: DCd Chemotherapy: adriamycin x1 on 01-10-16  Outpatient Physicians: [name]/ [name], L.Sofie Hartigan (PCP [facility]), [name], MD (endocrine, [facility]), Aloha Gell; [name] (in hospital previous admission); [name]  EMR reviewed. Patient seen, no visitors here presently   Subjective: Feels better overall, stronger since PRBCs and denies SOB now. Minimal cough. Sore from lovenox injections and discomfort in back which seems positional in bed, better when she sat up in chair yesterday. Was able to sleep last PM. Eating rice and lentils from home 3x yesterday, + juice and water between meals, declining glucerna, appetite better including hungry at night. Bowels moving small amounts when she voids. No bleeding. Denies significant discomfort from ascites. No problems with PAC.  Wants to go home today.  Understands that we would rather blood sugars be a little high rather than low.    PAC in Flu vaccine 01-08-16 Left ureteral stent 01-13-16  ONCOLOGIC HISTORY  Patient presented to Dr [name] 01-03-15 with vaginal spotting x 1 year. Pelvic US showed 33 mm endometrial stripe with ovaries normal, and endometrial biopsy 01-03-15 grade 3 endometrial adenocarcinoma. CT CAP 01-13-15 had negative chest, no adenopathy, mild fatty liver, markedly thickened endometrium and no evidence of metastatic disease outside of uterus. CA 125 preoperatively on 01-13-15 was 22.9. Surgery by Dr [name] 01-19-15 was robotic hysterectomy, BSO and resection of nodule at sigmoid serosa. At surgery there was milial tumor studding with 5-10 mm implants on bladder peritoneum and miliary disease on pelvic sidewalls, without upper abdominal disease evident, and omentum appeared normal. 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 Recommendation for 6 cycles of taxol carboplatin, then consideration of external beam RT + vaginal brachytherapy if CR. First carboplatin taxol was given 02-16-2015; she was neutropenic with ANC 0.7 by day 15 cycle 1. She was transfused 2 units PRBCs 5-19/5-20-16 for Hgb 7.8. Cycles 4 and 5 were both delayed due to thrombocytopenia. She completed chemotherapy 06-16-15 and vaginal brachytherapy30 gray in 5 fractions as of 09/14/2015. She did well thru end of 11-2015, including at gyn oncology exam 11-20-15 with CA 125 of 13 then. She presented to ED 12-29-15 with ~ 2 weeks progressive abdominal distension and pain, with large volume malignant ascites and large recurrent tumor burden. Cytology [id number] from ascites malignant cells consistent with gyn adenocarcinoma; CA 125 was up to 326 on 12-29-15. She was hospitalized thru 01-07-16, with paracentesis for 4 liters bloody fluid on 1-28, for 3.5 liters on 01-02-16 and 4 liters on 01-09-16. Chemo was held 01-08-16 due to concerning exam, with bilateral LE venous dopplers no evidence of DVT and CT confirming large ascites (not appreciated on [place] 01-06-16) and bulky tumor without apparent bleed. Patient and family were counseled on very aggressive and advanced recurrent malignancy, understood that any treatment would be in attempt to control disease but not potential cure. Discussion then about life support and advance directives, specifically that such measures would not improve the underlying malignancy; patient and family wanted to consider that information. She requested attempt at treatment, had first adriamycin on 01-10-16 She was receiving PRBCs on 01-11-16 when developed respiratory distress, intubated, supported in ICU with continuous dialysis and pressors. Left ureteral stent placed 01-13-16/ She was DC to SNF on 01-30-16. She had outpatient paracentesis for 2.8 liters on 02-06-16, then admitted on  02-07-16 - 02-15-16  with pneumonia and anemia. She was transfused 2 units PRBCs on 02-08-16 for hemoglobin 6.0. She was seen at [facility] 02-19-16, then admitted 02-23-16 with hypoglycemia and recurrent right pleural effusion.     Objective: Vital signs in last 24 hours: Blood pressure 138/72, pulse 120, temperature 98.8 F (37.1 C), temperature source Oral, resp. rate 16, height 5\' 1"  (1.549 m), weight 140 lb (63.504 kg), SpO2 100 %. Awake, alert, cooperative, looks some better overall. Able to turn side to side for exam. PERRL, not icteric. Oral mucosa moist and clear, mucous membranes less pale. Respirations not labored RA. Lungs clear posteriorly left to base and right upper and mid fields, with diminished BS and slight crackles right base, no leaking from thoracentesis tract there. Heart RRR no gallop. Abdomen moderately distended with ascites, not tight, not tender.  No unusual bruising from injections. A few BS. LE 1+ pedal edema without cords, feet warm, legs elevated on pillow. Moves all extremities.   Intake/Output from previous day: 03/26 0701 - 03/27 0700 In: 720 [P.O.:720] Out: -  Intake/Output this shift:     Lab Results:  Recent Labs  02/25/16 0300 02/26/16 0440  WBC 18.6* 21.2*  HGB 9.1* 9.0*  HCT 27.3* 27.1*  PLT 136* 154   Post 1 unit PRBCs 02-24-16  BMET  Recent Labs  02/25/16 0300 02/26/16 0440  NA 135 137  K 3.7 3.8  CL 102 104  CO2 24 23  GLUCOSE 193* 145*  BUN 13 15  CREATININE 1.21* 1.15*  CALCIUM 7.4* 7.6*    Studies/Results: No results found.   Assessment/Plan: 1.recurrent high grade endometrial carcinoma presenting 12-2015 with large volume malignant ascites, complicated course since then including acute respiratory decompensation and ARF in 01-2016 requiring ventilator support and CRRT initially. Situation not appropriate so far for additional chemo beyond first cycle adriamycin given 01-10-16, that in attempt to control disease.  Ascites is malignant from  previous cytology evaluations. Post right thoracentesis 850 cc this admission If another paracentesis necessary for symptoms, would try to get peritoneal drain placed, to allow management with small volume paracenteses out of hospital. Note large volume paracenteses in Feb contributed to ARF then. Patient and husband have wanted to pursue treatment rather than comfort care only; daughters have been more open to comfort care. This is not situation of potential cure. She has appointment at [facility] with this MD on 02-29-16 if DC. 2.hypoglycemia: likely oral agents with inadequate po intake. Improved since admission, no longer requiring D50 pushes and D10 infusion. No metastatic disease to liver by last CT 01-11-16 3.anemia: multifactorial including bloody ascites previously, blood draws, chemo, renal insufficiency. Transfused 1 unit PRBCs this admission.  Note she is lifelong vegan, had been on B12 supplements at least prior to all of this acute illness. B12 injection 02-25-16 4.Acute renal failure 01-2016: creatinine has improved further since last admission. Known to Dr Jonnie Finner in hospital 5.left ureteral stent placed 01-13-16 6. Diabetes has been followed by Dr [name] with [facility] 6.protein calorie malnutrition, general marked deconditioning: home PT by [facility] prior to this admission. Prefers regular food to supplements 7.PAC   Please page me if needed [place]

## 2016-02-28 ENCOUNTER — Telehealth: Payer: Self-pay

## 2016-02-28 ENCOUNTER — Other Ambulatory Visit: Payer: Self-pay

## 2016-02-28 ENCOUNTER — Other Ambulatory Visit: Payer: Self-pay | Admitting: Oncology

## 2016-02-28 ENCOUNTER — Telehealth: Payer: Self-pay | Admitting: Oncology

## 2016-02-28 ENCOUNTER — Other Ambulatory Visit: Payer: Self-pay | Admitting: *Deleted

## 2016-02-28 DIAGNOSIS — R180 Malignant ascites: Secondary | ICD-10-CM

## 2016-02-28 LAB — CULTURE, BODY FLUID W GRAM STAIN -BOTTLE: Culture: NO GROWTH

## 2016-02-28 LAB — CULTURE, BODY FLUID-BOTTLE

## 2016-02-28 NOTE — Telephone Encounter (Signed)
Patient's daughter called requesting her Mother to be scheduled for a paracentesis.  She states that the fluid has accumulated in patient's abdomen and is now accumulating in her legs.  Daughter is nervous that the fluid will begin to go into patient's lungs. Dr. Marko Plume called and is taking care of this problem.

## 2016-02-28 NOTE — Telephone Encounter (Signed)
Medical Oncology  Called by [facility] triage that Patient and family are requesting paracentesis   As we had discussed peritoneal drain at time of next paracentesis, I spoke directly with IR at [facility] and [facility], could do the procedure on 3-31 as earliest at each facility.  MD then spoke with daughter Sonal directly, who tells me that patient is more symptomatic with abdominal distension tho not acutely so, and that patient does not want peritoneal drain.  MD then spoke directly with [facility], set up paracentesis at [facility] for 10:00 on 3-30 as only spot available. 2 liter max for fluid removal.   Spoke back with daughter, told her to contact [facility] after paracentesis to see if MD can work patient in prior to scheduled 3:30 MD appointment also on 02-29-16.  Daughter appreciated calls.  Godfrey Pick, MD

## 2016-02-29 ENCOUNTER — Telehealth: Payer: Self-pay | Admitting: Oncology

## 2016-02-29 ENCOUNTER — Ambulatory Visit (HOSPITAL_COMMUNITY)
Admission: RE | Admit: 2016-02-29 | Discharge: 2016-02-29 | Disposition: A | Discharge status: Home / Self Care | Payer: BLUE CROSS/BLUE SHIELD | Source: Ambulatory Visit | Attending: Oncology | Admitting: Oncology

## 2016-02-29 ENCOUNTER — Ambulatory Visit: Payer: BLUE CROSS/BLUE SHIELD

## 2016-02-29 ENCOUNTER — Other Ambulatory Visit: Payer: Self-pay

## 2016-02-29 ENCOUNTER — Other Ambulatory Visit: Payer: BLUE CROSS/BLUE SHIELD

## 2016-02-29 ENCOUNTER — Other Ambulatory Visit (HOSPITAL_BASED_OUTPATIENT_CLINIC_OR_DEPARTMENT_OTHER): Payer: BLUE CROSS/BLUE SHIELD

## 2016-02-29 ENCOUNTER — Telehealth: Payer: Self-pay

## 2016-02-29 ENCOUNTER — Other Ambulatory Visit: Payer: Self-pay | Admitting: Oncology

## 2016-02-29 ENCOUNTER — Ambulatory Visit (HOSPITAL_BASED_OUTPATIENT_CLINIC_OR_DEPARTMENT_OTHER): Payer: BLUE CROSS/BLUE SHIELD | Admitting: Oncology

## 2016-02-29 ENCOUNTER — Encounter: Payer: Self-pay | Admitting: Oncology

## 2016-02-29 VITALS — BP 138/76 | HR 123 | Temp 97.9°F | Resp 18 | Ht 61.0 in | Wt 143.6 lb

## 2016-02-29 DIAGNOSIS — J910 Malignant pleural effusion: Secondary | ICD-10-CM | POA: Diagnosis not present

## 2016-02-29 DIAGNOSIS — N289 Disorder of kidney and ureter, unspecified: Secondary | ICD-10-CM

## 2016-02-29 DIAGNOSIS — Z95828 Presence of other vascular implants and grafts: Secondary | ICD-10-CM

## 2016-02-29 DIAGNOSIS — D6481 Anemia due to antineoplastic chemotherapy: Secondary | ICD-10-CM

## 2016-02-29 DIAGNOSIS — E119 Type 2 diabetes mellitus without complications: Secondary | ICD-10-CM

## 2016-02-29 DIAGNOSIS — C541 Malignant neoplasm of endometrium: Secondary | ICD-10-CM

## 2016-02-29 DIAGNOSIS — R64 Cachexia: Secondary | ICD-10-CM

## 2016-02-29 DIAGNOSIS — R180 Malignant ascites: Secondary | ICD-10-CM

## 2016-02-29 DIAGNOSIS — K5660 Unspecified intestinal obstruction: Secondary | ICD-10-CM | POA: Insufficient documentation

## 2016-02-29 DIAGNOSIS — E46 Unspecified protein-calorie malnutrition: Secondary | ICD-10-CM

## 2016-02-29 DIAGNOSIS — N179 Acute kidney failure, unspecified: Secondary | ICD-10-CM

## 2016-02-29 DIAGNOSIS — E162 Hypoglycemia, unspecified: Secondary | ICD-10-CM

## 2016-02-29 DIAGNOSIS — D649 Anemia, unspecified: Secondary | ICD-10-CM

## 2016-02-29 DIAGNOSIS — E43 Unspecified severe protein-calorie malnutrition: Secondary | ICD-10-CM

## 2016-02-29 DIAGNOSIS — J90 Pleural effusion, not elsewhere classified: Secondary | ICD-10-CM

## 2016-02-29 DIAGNOSIS — K567 Ileus, unspecified: Secondary | ICD-10-CM

## 2016-02-29 DIAGNOSIS — E11649 Type 2 diabetes mellitus with hypoglycemia without coma: Secondary | ICD-10-CM

## 2016-02-29 DIAGNOSIS — E118 Type 2 diabetes mellitus with unspecified complications: Secondary | ICD-10-CM

## 2016-02-29 LAB — CBC WITH DIFFERENTIAL/PLATELET
BASO%: 0.2 % (ref 0.0–2.0)
Basophils Absolute: 0 10*3/uL (ref 0.0–0.1)
EOS ABS: 0 10*3/uL (ref 0.0–0.5)
EOS%: 0.1 % (ref 0.0–7.0)
HEMATOCRIT: 26.6 % — AB (ref 34.8–46.6)
HEMOGLOBIN: 8.7 g/dL — AB (ref 11.6–15.9)
LYMPH%: 10.5 % — AB (ref 14.0–49.7)
MCH: 26.9 pg (ref 25.1–34.0)
MCHC: 32.7 g/dL (ref 31.5–36.0)
MCV: 82.1 fL (ref 79.5–101.0)
MONO#: 1.3 10*3/uL — AB (ref 0.1–0.9)
MONO%: 8 % (ref 0.0–14.0)
NEUT%: 81.2 % — AB (ref 38.4–76.8)
NEUTROS ABS: 13.1 10*3/uL — AB (ref 1.5–6.5)
NRBC: 0 % (ref 0–0)
Platelets: 174 10*3/uL (ref 145–400)
RBC: 3.24 10*6/uL — ABNORMAL LOW (ref 3.70–5.45)
RDW: 17.2 % — ABNORMAL HIGH (ref 11.2–14.5)
WBC: 16.2 10*3/uL — ABNORMAL HIGH (ref 3.9–10.3)
lymph#: 1.7 10*3/uL (ref 0.9–3.3)

## 2016-02-29 LAB — COMPREHENSIVE METABOLIC PANEL
ALBUMIN: 1.5 g/dL — AB (ref 3.5–5.0)
ALK PHOS: 109 U/L (ref 40–150)
ALT: 12 U/L (ref 0–55)
AST: 16 U/L (ref 5–34)
Anion Gap: 12 mEq/L — ABNORMAL HIGH (ref 3–11)
BILIRUBIN TOTAL: 0.44 mg/dL (ref 0.20–1.20)
BUN: 23.7 mg/dL (ref 7.0–26.0)
CALCIUM: 7.8 mg/dL — AB (ref 8.4–10.4)
CO2: 19 mEq/L — ABNORMAL LOW (ref 22–29)
CREATININE: 1.7 mg/dL — AB (ref 0.6–1.1)
Chloride: 100 mEq/L (ref 98–109)
EGFR: 32 mL/min/{1.73_m2} — ABNORMAL LOW (ref >90)
GLUCOSE: 170 mg/dL — AB (ref 70–140)
POTASSIUM: 4.7 meq/L (ref 3.5–5.1)
SODIUM: 131 meq/L — AB (ref 136–145)
TOTAL PROTEIN: 5.9 g/dL — AB (ref 6.4–8.3)

## 2016-02-29 MED ORDER — SODIUM CHLORIDE 0.9% FLUSH
10.0000 mL | INTRAVENOUS | Status: DC | PRN
  Administered 2016-02-29: 10 mL via INTRAVENOUS
  Filled 2016-02-29: qty 10

## 2016-02-29 MED ORDER — ONDANSETRON HCL 8 MG PO TABS
8.0000 mg | ORAL_TABLET | Freq: Three times a day (TID) | ORAL | Status: AC | PRN
Start: 2016-02-29 — End: ?

## 2016-02-29 MED ORDER — PANTOPRAZOLE SODIUM 40 MG PO TBEC: 40.0000 mg | DELAYED_RELEASE_TABLET | Freq: Every day | ORAL | Status: AC

## 2016-02-29 MED ORDER — HEPARIN SOD (PORK) LOCK FLUSH 100 UNIT/ML IV SOLN
500.0000 [IU] | Freq: Once | INTRAVENOUS | Status: AC
  Administered 2016-02-29: 500 [IU] via INTRAVENOUS
  Filled 2016-02-29: qty 5

## 2016-02-29 NOTE — Telephone Encounter (Signed)
Medical Oncology  Spoke with daughter about abdominal Xray and lab information from today.  Patient had good bowel movement after returning home. Hospice has called, offered to see then tomorrow, however daughter requested they come instead on Sat.4-1 AM as her sister is arriving from [place] on 3-31 pm. Hospice told daughter that oxygen can be ordered and delivered also on 03-02-16.   Daughter thanked me for call  L.Marko Plume, MD

## 2016-02-29 NOTE — Progress Notes (Signed)
Patient ID: Brittany Schwartz, female   DOB: 07/09/1951, 65 y.o.   MRN: [id number] Pt presented to [place] dept today for paracentesis. On limited US abd in all four quadrants there is no significant ascites present. Procedure was cancelled. Above d/w pt/daughter.

## 2016-02-29 NOTE — Telephone Encounter (Signed)
-----   Message from [name], MD sent at 02/29/2016 12:57 PM EDT ----- protonix 40 mg daily for acid reflux  #30   zofran 8 mg   One every 8 hrs prn nausea #20

## 2016-02-29 NOTE — Progress Notes (Signed)
OFFICE PROGRESS NOTE   March 02, 2016   Physicians: [name]/ [name]; [name], MD (endocrine [facility]), Aloha Gell; [name] (PCP [facility]); [place], Janene Madeira, Theodore  INTERVAL HISTORY:  Patient is seen, together with daughter and niece, in continuing attention to recurrent endometrial carcinoma. She was worked in earlier than originally on physicain's schedule, so that she would not have to wait as long after radiology appointment earlier today.  She was hospitalized again from 3-24 thru 02-26-16 with hypoglycemia and symptomatic right pleural effusion, this her fifth admission since late Jan 2-17   ( 3-24 thru 3-27, 3-8 thru 3-16, 2-9 thru 2-28, 2-2 thru 2-5, 1-27 thru 2-2). Hypoglycemia appeared related to oral hypoglycemics and inadequate po intake, required ~ 24 hours of IV glucose as D10 and D50 to resolve. She had right thoracentesis for 850 cc on 02-23-16.  Patient and family again declined hospice during that admission, tho performance status is too poor to attempt further chemotherapy.    Family contacted this office on 02-28-16 requesting paracentesis. [place] prior to MD visit today did not show sufficient ascites to do that procedure. Since DC home on 3-27, blood sugars have been ~ 260 - 270, family checking mutliple times during day. Blood glucose on chemistries this AM is 170. I have asked family to decrease CBG monitoring to fasting and late afternoon + prn if symptoms of hypoglycemia, and we have discussed again that a little higher blood sugars are preferable to low now. She is eating small amounts of rice and lentils, has not wanted glucerna and it seems best at this point to let her eat what she prefers; she is not drinking as much fluid at home. She had small soft stools with each void during hospitalization and after arrival home, apparently took imodium for this x 2 and has not had bowel movement now in 3 days. She has not taken miralax or  other laxative. She has occasional nausea but has not vomited. She complains of acid reflux, will resume protonix daily. She has some fullness discomfort across upper abdomen and used oxycodone at least once since DC. She is SOB with slight exertion at home, O2 sats in hospital still higher than what would allow home O2 by [facility]. No bleeding. No fever. No productive cough. May not be voiding as much. Swelling LE increased, no DVTs when last evaluated and albumin low. Family encouraging her to be up, patient does not feel able.  Remainder of 10 point Review of Systems unchanged.  Right thoracentesis 3-24 for 850cc and 02-12-16 for 700 cc.  PRBCs x2 on 02-08-16 for hgb 6.0 and 1 unit on 3-25 for hgb 7.5 Paracentesis 2.8 liters on 02-06-16 Urine 02-07-16 VRE (did not require treatment then) Last imaging of liver was CT AP 01-11-16, no liver mets then  PAC in Flu vaccine 01-08-16 Left ureteral stent 01-13-16   ONCOLOGIC HISTORY  Patient presented to Dr [name] 01-03-15 with vaginal spotting x 1 year. Pelvic US showed 33 mm endometrial stripe with ovaries normal, and endometrial biopsy 01-03-15 grade 3 endometrial adenocarcinoma. CT CAP 01-13-15 had negative chest, no adenopathy, mild fatty liver, markedly thickened endometrium and no evidence of metastatic disease outside of uterus. CA 125 preoperatively on 01-13-15 was 22.9. Surgery by Dr [name] 01-19-15 was robotic hysterectomy, BSO and resection of nodule at sigmoid serosa. At surgery there was milial tumor studding with 5-10 mm implants on bladder peritoneum and miliary disease on pelvic sidewalls, without upper abdominal disease evident, and  omentum appeared normal. 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 Recommendation for 6 cycles of taxol carboplatin, then consideration of external beam RT + vaginal brachytherapy if  CR. First carboplatin taxol was given 02-16-2015; she was neutropenic with ANC 0.7 by day 15 cycle 1. She was transfused 2 units PRBCs 5-19/5-20-16 for Hgb 7.8. Cycles 4 and 5 were both delayed due to thrombocytopenia. She completed chemotherapy 06-16-15 and vaginal brachytherapy30 gray in 5 fractions as of 09/14/2015. She did well thru end of 11-2015, including at gyn oncology exam 11-20-15 with CA 125 of 13 then. She presented to ED 12-29-15 with ~ 2 weeks progressive abdominal distension and pain, with large volume malignant ascites and large recurrent tumor burden. Cytology [id number] from ascites malignant cells consistent with gyn adenocarcinoma; CA 125 was up to 326 on 12-29-15. She was hospitalized thru 01-07-16, with paracentesis for 4 liters bloody fluid on 1-28, for 3.5 liters on 01-02-16 and 4 liters on 01-09-16. Chemo was held 01-08-16 due to concerning exam, with bilateral LE venous dopplers no evidence of DVT and CT confirming large ascites (not appreciated on [place] 01-06-16) and bulky tumor without apparent bleed. Patient and family were counseled on very aggressive and advanced recurrent malignancy, understood that any treatment would be in attempt to control disease but not potential cure. Discussion then about life support and advance directives, specifically that such measures would not improve the underlying malignancy; patient and family wanted to consider that information. She requested attempt at treatment, had first adriamycin on 01-10-16 She was receiving PRBCs on 01-11-16 when developed respiratory distress, intubated, supported in ICU with continuous dialysis and pressors. She was DC to SNF on 01-30-16. She had outpatient paracentesis for 2.8 liters on 02-06-16, then admitted on 02-07-16 with pneumonia and anemia. She was transfused 2 units PRBCs on 02-08-16 for hemoglobin 6.0.     Objective:  Vital signs in last 24 hours:  BP 138/76 mmHg  Pulse 123  Temp(Src) 97.9 F (36.6 C) (Oral)  Resp 18  Ht  5\' 1"  (1.549 m)  Wt 143 lb 9.6 oz (65.137 kg)  BMI 27.15 kg/m2  SpO2 100%  Alert, oriented and cooperative. Looks very weak and mildly uncomfortable in WC. Respirations not labored RA. Cachectic, abdomen distended, LE 2-3+ edema to thighs.   HEENT:PERRL, sclerae not icteric. Oral mucosa somewhat dry without lesions, posterior pharynx clear. Marland Kitchen No JVD.  Lymphatics:no supraclavicular adenopathy Resp: diminished BS lower 1/4 right with dullness there, no wheezes or crackles otherwise Cardio: regular rate and rhythm. No gallop. GI:  Distended tho not tight, cannot tell fluid wave. Minimal bowel sounds. Not tender to gentle exam including epigastrium.  Musculoskeletal/ Extremities:LE 2-3+ edema to thighs, no cords or tenderness, no weeping Neuro: no peripheral neuropathy. Otherwise nonfocal Skin without rash, ecchymosis, petechiae Portacath-without erythema or tenderness  Lab Results: CBC today with WBC 16.2, ANC 13.1, Hgb 8.7, plt 174 CMET Na 131, K 4.7, CO2 19, glu 170, BUN 23, creat 1.7, LFTs ok, T prot 5.9 and alb 1.5, calcium uncorrected 7.8  Studies/Results:  LIMITED ABDOMEN ULTRASOUND FOR ASCITES 02-29-16  TECHNIQUE: Limited ultrasound survey for ascites was performed in all four abdominal quadrants.  COMPARISON: Previous abdominal ultrasound for paracentesis of February 06, 2016  FINDINGS: Evaluation of the abdomen reveals no significant ascites. There is a pleural effusion on the right.  IMPRESSION: Insufficient ascitic fluid for successful  paracentesis. The images were reviewed real-time by K Allred, PA     Dg Abd 1 View  02/29/2016  CLINICAL DATA:  GYN malignancy. No bowel movement for several days, left upper quadrant discomfort. EXAM: ABDOMEN - 1 VIEW COMPARISON:  Supine abdominal x-ray of January 12, 2016 FINDINGS: There are loops of mildly distended gas-filled jejunum and ileum in the mid abdomen. There is stool within the colon. There is no stool or gas in the  rectum. There are no free extraluminal gas collections. The double pigtail stents are in reasonable position. There is degenerative change of the lumbar spine with a large right lateral endplate osteophyte at [id number]. IMPRESSION: Findings compatible with a partial distal small bowel obstruction or ileus. The colonic stool burden is moderate. There is no evidence of perforation. Electronically Signed   By: David  Martinique M.D.   On: 02/29/2016 14:09    Medications: I have reviewed the patient's current medications. Hold oral hypoglycemics. Add miralax to keep bowels moving daily. Prn antiemetics. Would not use imodium.  DISCUSSION All of interval history reviewed.  Daughter verbalizes their understanding that patient is too weak to attempt further chemo. I have talked with them again now about all of the time she has spent in hospital and challenges of managing at home now. They are anxious about symptoms and discomfort. Patient expresses that she would rather stay at home if possible. We have discussed blood sugars as above and I have told them that pushing her to be more active will not help overall now. We have talked again about hospice to assist with comfort care at home, including O2 at home to use prn, continuing MD visits at this office if they prefer, continuing involvement of MD with hospice team. Patient herself states agreement with Hospice referral now.  I have spoken with [facility] to make referral now, requested O2 2 liters [place] prn.   Patient has declined peritoneal drain. We have not discussed pleurex.  I have explained that cancer in abdomen and pelvis + very low albumin could well be causing increased swelling in dependent areas including legs. They will elevate her legs as possible, will not repeat LE venous dopplers now. Labs drawn and patient sent back to radiology for abdominal xray after MD visit today, at patient's request. I have spoken with daughter by phone with results -  see phone note.      Assessment/Plan:  1.recurrent high grade endometrial carcinoma presenting 12-2015 with large volume malignant ascites, complicated course since then including acute respiratory decompensation and ARF in 01-2016 requiring ventilator support and CRRT initially. Situation not appropriate for additional chemo beyond first cycle adriamycin given 01-10-16, that in attempt to control disease.  Ascites is malignant from previous cytology evaluations. She continues to decline despite being very determined and despite excellent family support. Comfort care is really priority now and hopefully Hospice will be of benefit. 2.hypoglycemia: likely oral agents with inadequate po intake prior to last admission. Last imaging of liver in Feb did not show metastatic disease there, and fact that this has resolved off of medication does not suggest mets to liver. Preferable to have blood sugars be somewhat elevated now rather than hypoglycemic 3.anemia: multifactorial including bloody ascites previously, blood draws, chemo, renal insufficiency. Note she is lifelong vegan, had been on B12 supplements in past, IM B12 given last admission.  4.Acute renal failure 01-2016 for which she was on continuous dialysis while in ICU.  Known to Dr Jonnie Finner in hospital. She  is not appropriate for hemodialysis. Creatinine slightly higher today. Left ureteral stent placed 01-13-16 5.recurrent right pleural effusion: culture negative 3-24 and cytology with reactive mesothelial cells, but this is clearly associated with third spacing fluid from advanced gyn cancer. Prn home O2 for symptoms, thoracentesis for comfort if very symptomatic.  6. Diabetes has been followed by Dr [name] with [facility], cc this note to him 6.protein calorie malnutrition, general marked deconditioning: not unexpected with advanced gyn cancer. Comfort care priority 7.LE swelling likely multifactorial with third spacing fluid related to  abdominal/ pelvic malignancy, albumn 1.5. No DVT last evaluation, anticoagulation would be extremely difficult, no clear symptoms of DVT now.  8.PAC in 9.code status had been do not reintubate after ventilator support discontinued, tho again last admission patient and family requested full code status. Her underlying cancer is incurable and code interventions will not help this. There are cultural and generational factors involved, as [name] and I discussed during last admission. Additional education by Hospice may be helpful ongoing.  At their request, I have worked her into my office schedule for visit again on 03-04-16.  Time spent 35 min including >50% counseling and coordination of care. Cc PCP Dr Creig Hines and Dr D.Tracey Harries, MD   03/02/2016, 10:51 AM

## 2016-02-29 NOTE — Patient Instructions (Signed)

## 2016-02-29 NOTE — Telephone Encounter (Signed)
appt made and avs printed. Pt will go to lab and have xray done today at [facility]

## 2016-02-29 NOTE — Telephone Encounter (Signed)
S/w hospice for new referral, Dr Marko Plume attending

## 2016-03-02 DIAGNOSIS — Z95828 Presence of other vascular implants and grafts: Secondary | ICD-10-CM | POA: Insufficient documentation

## 2016-03-02 DIAGNOSIS — E118 Type 2 diabetes mellitus with unspecified complications: Secondary | ICD-10-CM | POA: Insufficient documentation

## 2016-03-02 DIAGNOSIS — J90 Pleural effusion, not elsewhere classified: Secondary | ICD-10-CM | POA: Insufficient documentation

## 2016-03-02 DIAGNOSIS — K567 Ileus, unspecified: Secondary | ICD-10-CM | POA: Insufficient documentation

## 2016-03-02 DIAGNOSIS — R64 Cachexia: Secondary | ICD-10-CM | POA: Insufficient documentation

## 2016-03-03 ENCOUNTER — Other Ambulatory Visit: Payer: Self-pay | Admitting: Oncology

## 2016-03-04 ENCOUNTER — Telehealth: Payer: Self-pay | Admitting: Oncology

## 2016-03-04 ENCOUNTER — Ambulatory Visit (HOSPITAL_BASED_OUTPATIENT_CLINIC_OR_DEPARTMENT_OTHER): Payer: BLUE CROSS/BLUE SHIELD | Admitting: Oncology

## 2016-03-04 ENCOUNTER — Telehealth: Payer: Self-pay

## 2016-03-04 ENCOUNTER — Encounter: Payer: Self-pay | Admitting: Oncology

## 2016-03-04 ENCOUNTER — Other Ambulatory Visit: Payer: Self-pay

## 2016-03-04 VITALS — BP 116/47 | HR 103 | Resp 16 | Ht 61.0 in | Wt 142.9 lb

## 2016-03-04 DIAGNOSIS — N179 Acute kidney failure, unspecified: Secondary | ICD-10-CM

## 2016-03-04 DIAGNOSIS — E46 Unspecified protein-calorie malnutrition: Secondary | ICD-10-CM

## 2016-03-04 DIAGNOSIS — D6481 Anemia due to antineoplastic chemotherapy: Secondary | ICD-10-CM

## 2016-03-04 DIAGNOSIS — C541 Malignant neoplasm of endometrium: Secondary | ICD-10-CM | POA: Diagnosis not present

## 2016-03-04 DIAGNOSIS — R0602 Shortness of breath: Secondary | ICD-10-CM

## 2016-03-04 DIAGNOSIS — R180 Malignant ascites: Secondary | ICD-10-CM | POA: Diagnosis not present

## 2016-03-04 DIAGNOSIS — R64 Cachexia: Secondary | ICD-10-CM

## 2016-03-04 DIAGNOSIS — E162 Hypoglycemia, unspecified: Secondary | ICD-10-CM

## 2016-03-04 DIAGNOSIS — N289 Disorder of kidney and ureter, unspecified: Secondary | ICD-10-CM

## 2016-03-04 DIAGNOSIS — E119 Type 2 diabetes mellitus without complications: Secondary | ICD-10-CM

## 2016-03-04 DIAGNOSIS — R140 Abdominal distension (gaseous): Secondary | ICD-10-CM

## 2016-03-04 DIAGNOSIS — J910 Malignant pleural effusion: Secondary | ICD-10-CM

## 2016-03-04 DIAGNOSIS — D649 Anemia, unspecified: Secondary | ICD-10-CM

## 2016-03-04 MED ORDER — ALBUTEROL SULFATE (2.5 MG/3ML) 0.083% IN NEBU: 2.5000 mg | INHALATION_SOLUTION | Freq: Four times a day (QID) | RESPIRATORY_TRACT | Status: AC | PRN

## 2016-03-04 MED ORDER — MORPHINE SULFATE 20 MG/5ML PO SOLN: ORAL | Status: AC

## 2016-03-04 NOTE — Telephone Encounter (Signed)
S/w daughter that albuterol was escribed per Dr Edwyna Shell attached note.

## 2016-03-04 NOTE — Telephone Encounter (Signed)
-----   Message from [name], MD sent at 03/04/2016 10:36 AM EDT ----- Please refill nebulizer solution med QS ~ 2 wks 2 RF  thanks

## 2016-03-04 NOTE — Progress Notes (Signed)
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 She is progressively declining,  with multiple complications related to the malignant disease.  [facility] initiated services over weekend, with Hospice RN to follow up today or 03-05-16.   Patient is weak, with poor po intake. She is uncomfortable from abdominal distension, tho abdominal US on 03-02-16 did not identify sufficient ascites for paracentesis. She feels SOB at times, may need prn home O2 by Hospice for symptoms even if O2 sat not <90%. She is using nebulizers prn, which seem somewhat helpful. Bowels moved well on 03-02-16, not much since then tho also not eating much at all. She has had no overt bleeding. She has had no fever, no productive cough. They have not tried pain medication recently. Swelling in LE may be just a little better, is keeping them elevated now. Blood sugars are >=200, no hypoglycemic episodes since oral hypoglycemics held following that problem last hospitalization. No fever. Not voiding as much as previously. No problems with PAC.      Right thoracentesis 3-24 for 850cc and 02-12-16 for 700 cc.  PRBCs x2 on 02-08-16 for hgb 6.0 and 1 unit on 3-25 for hgb 7.5 Paracentesis 2.8 liters on 02-06-16 Urine 02-07-16 VRE (did not require treatment then) Last imaging of liver was CT AP 01-11-16, no liver mets then  PAC flushed 02-29-16 Flu vaccine 01-08-16 Left ureteral stent 01-13-16  ONCOLOGIC HISTORY Patient presented to Dr [name] 01-03-15 with vaginal spotting x 1 year. Pelvic US showed 33 mm endometrial stripe with ovaries normal, and endometrial biopsy 01-03-15 grade 3 endometrial adenocarcinoma. CT CAP 01-13-15 had negative chest, no  adenopathy, mild fatty liver, markedly thickened endometrium and no evidence of metastatic disease outside of uterus. CA 125 preoperatively on 01-13-15 was 22.9. Surgery by Dr [name] 01-19-15 was robotic hysterectomy, BSO and resection of nodule at sigmoid serosa. At surgery there was milial tumor studding with 5-10 mm implants on bladder peritoneum and miliary disease on pelvic sidewalls, without upper abdominal disease evident, and omentum appeared normal. 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 Recommendation for 6 cycles of taxol carboplatin, then consideration of external beam RT + vaginal brachytherapy if CR. First carboplatin taxol was given 02-16-2015; she was neutropenic with ANC 0.7 by day 15 cycle 1. She was transfused 2 units PRBCs 5-19/5-20-16 for Hgb 7.8. Cycles 4 and 5 were both delayed due to thrombocytopenia. She completed chemotherapy 06-16-15 and vaginal brachytherapy30 gray in 5 fractions as of 09/14/2015. She did well thru end of 11-2015, including at gyn oncology exam 11-20-15 with CA 125 of 13 then. She presented to ED 12-29-15 with ~ 2 weeks progressive abdominal distension and pain, with large volume malignant ascites and large recurrent tumor burden. Cytology [id number] from ascites malignant cells consistent with gyn adenocarcinoma; CA 125 was up to 326 on 12-29-15. She was hospitalized thru 01-07-16, with paracentesis for 4 liters bloody fluid on 1-28, for 3.5 liters on 01-02-16 and 4 liters on 01-09-16. Chemo was held 01-08-16 due to concerning exam, with bilateral LE venous dopplers no evidence of DVT and CT confirming large ascites (not appreciated on [place] 01-06-16) and bulky  tumor without apparent bleed. Patient and family were counseled on very aggressive and advanced recurrent malignancy, understood that any treatment would be in attempt to  control disease but not potential cure. Discussion then about life support and advance directives, specifically that such measures would not improve the underlying malignancy; patient and family wanted to consider that information. She requested attempt at treatment, had first adriamycin on 01-10-16 She was receiving PRBCs on 01-11-16 when developed respiratory distress, intubated, supported in ICU with continuous dialysis and pressors. She was DC to SNF on 01-30-16. She had outpatient paracentesis for 2.8 liters on 02-06-16, then admitted on 02-07-16 with pneumonia and anemia. She was transfused 2 units PRBCs on 02-08-16 for hemoglobin 6.0.   Objective:  Vital signs in last 24 hours:  BP 116/47 mmHg  Pulse 103  Temp(Src)   Resp 16  Ht 5' 1" (1.549 m)  Wt 142 lb 14.4 oz (64.819 kg)  BMI 27.01 kg/m2  SpO2 97% Appears generally weak, cachectic, abdomen distended, LE swollen. Respirations not labored RA. Awake and alert, responds appropriately, in [facility], able to reposition in [facility] for exam with effort.  HEENT:PERRL, sclerae not icteric. Oral mucosa somewhat dry without lesions, posterior pharynx clear.  No JVD.  Lymphatics:no supraclavicular adenopathy Resp: clear to auscultation other than decreased BS lower 1/4 right posterior, unchanged. No use of accessory muscles Cardio: regular rate and rhythm. No gallop. GI:  Distended tho not quite tight, cannot tell fluid wave, cannot appreciate discrete mass or organomegaly. Minimal, occasional bowel sounds. Surgical incision not remarkable. Musculoskeletal/ Extremities: 2-3+ edema LE to thighs a little less than at my visit last week. No clear cords or tenderness.  Neuro: no focal deficits tho generally very weak. Skin: mucous membranes somewhat pale, not icteric.  without rash, ecchymosis, petechiae Portacath-without erythema or tenderness  Lab Results: LABS FROM 02-29-16, not repeated today Results for orders placed or performed in visit on 02/29/16  CBC  with Differential  Result Value Ref Range   WBC 16.2 (H) 3.9 - 10.3 10e3/uL   NEUT# 13.1 (H) 1.5 - 6.5 10e3/uL   HGB 8.7 (L) 11.6 - 15.9 g/dL   HCT 26.6 (L) 34.8 - 46.6 %   Platelets 174 145 - 400 10e3/uL   MCV 82.1 79.5 - 101.0 fL   MCH 26.9 25.1 - 34.0 pg   MCHC 32.7 31.5 - 36.0 g/dL   RBC 3.24 (L) 3.70 - 5.45 10e6/uL   RDW 17.2 (H) 11.2 - 14.5 %   lymph# 1.7 0.9 - 3.3 10e3/uL   MONO# 1.3 (H) 0.1 - 0.9 10e3/uL   Eosinophils Absolute 0.0 0.0 - 0.5 10e3/uL   Basophils Absolute 0.0 0.0 - 0.1 10e3/uL   NEUT% 81.2 (H) 38.4 - 76.8 %   LYMPH% 10.5 (L) 14.0 - 49.7 %   MONO% 8.0 0.0 - 14.0 %   EOS% 0.1 0.0 - 7.0 %   BASO% 0.2 0.0 - 2.0 %   nRBC 0 0 - 0 %  Comprehensive metabolic panel  Result Value Ref Range   Sodium 131 (L) 136 - 145 mEq/L   Potassium 4.7 3.5 - 5.1 mEq/L   Chloride 100 98 - 109 mEq/L   CO2 19 (L) 22 - 29 mEq/L   Glucose 170 (H) 70 - 140 mg/dl   BUN 23.7 7.0 - 26.0 mg/dL   Creatinine 1.7 (H) 0.6 - 1.1 mg/dL   Total Bilirubin 0.44 0.20 - 1.20 mg/dL   Alkaline Phosphatase 109 40 - 150 U/L  AST 16 5 - 34 U/L   ALT 12 0 - 55 U/L   Total Protein 5.9 (L) 6.4 - 8.3 g/dL   Albumin 1.5 (L) 3.5 - 5.0 g/dL   Calcium 7.8 (L) 8.4 - 10.4 mg/dL   Anion Gap 12 (H) 3 - 11 mEq/L   EGFR 32 (L) >90 ml/min/1.73 m2     Studies/Results:  No results found.  Medications: I have reviewed the patient's current medications. Prescription given for morphine solution, encouraged patient and family to try this for abdominal discomfort   DISCUSSION I have been very clear with patient and daughters now that symptom management and comfort care is priority. I have explained that she is progressively declining from advanced gyn malignancy and complications, and that pushing her to be active will not be helpful. Encouraged them to let Hospice assist.   At daughter's request, we have reviewed last CA 125 value. I have explained that repeating this now would not change what we are appropriately  able to do.  Daughter is repeatedly requesting that we try again to see if paracentesis possible; patient also feels that she wants to attempt this. I have spoken directly with radiology, able to get [place] and possible paracentesis for tomorrow AM. I have told patient and daughters that this can be cancelled if she does not feel able to go to appointment tomorrow, and have capped total fluid at 1.5 liters if done.    I have not tried to address code status again. Ongoing education from Hospice greatly needed for this sweet lady and her very supportive family.    Assessment/Plan:   1.recurrent high grade endometrial carcinoma presenting 12-2015 with large volume malignant ascites, complicated course since then including acute respiratory decompensation and ARF in 01-2016 requiring ventilator support and CRRT initially. Situation not appropriate for additional chemo beyond first cycle adriamycin given 01-10-16, that in attempt to control disease.  Ascites is malignant from previous cytology evaluations. She continues to decline despite being very determined and despite excellent family support. Comfort care is really priority now and hopefully Hospice will be of benefit. Prn morphine solution, hopefully home O2 prn.  2.hypoglycemia: likely due to oral agents with inadequate po intake prior to last admission. Last imaging of liver in Feb did not show metastatic disease there, and fact that this has resolved off of medication does not suggest mets to liver. Preferable to have blood sugars be somewhat elevated now rather than hypoglycemic. Family and patient seem more agreeable to this now. 3.anemia: multifactorial including bloody ascites previously, blood draws, chemo, renal insufficiency. Note she is lifelong vegan, had been on B12 supplements in past, IM B12 given last admission.  4.Acute renal failure 01-2016 for which she was on continuous dialysis while in ICU. Known to Dr Jonnie Finner in hospital. She is  not appropriate for hemodialysis. Creatinine slightly higher 02-29-16.  Left ureteral stent placed 01-13-16 5.recurrent right pleural effusion: culture negative 3-24 and cytology with reactive mesothelial cells, but this is clearly associated with third spacing fluid from advanced gyn cancer. Prn home O2 for symptoms, thoracentesis for comfort if very symptomatic.  6. Diabetes has been followed by Dr [name] with [facility], cc this note to him 6.protein calorie malnutrition, general marked deconditioning: not unexpected with advanced gyn cancer. Comfort care priority 7.LE swelling likely multifactorial with third spacing fluid related to abdominal/ pelvic malignancy, albumn 1.5. No DVT last evaluation, anticoagulation would be extremely difficult, no clear symptoms of DVT now.  8.PAC in 9.code  status had been do not reintubate after ventilator support discontinued, tho again last admission patient and family requested full code status. Her underlying cancer is incurable and code interventions will not help this. There are cultural and generational factors involved, as [name] and I discussed during last admission. Additional education by Hospice may be helpful ongoing.   All questions answered, unfortunately just very difficult and sad situation. I am glad to see her back as they request if patient is able, but I have also told them that we can cancel appointments if not appropriate closer to the time. Time spent 20 min including >50% counseling and coordination of care.  Cc PCP, Dr D.Posey Pronto, Hospice   [name], MD   03/04/2016, 12:43 PM

## 2016-03-04 NOTE — Telephone Encounter (Signed)
Spoke with patient daughter to inform her of 3 week fu appt 4/24 at 930 am

## 2016-03-04 NOTE — Telephone Encounter (Signed)
rx escribed per Dr Edwyna Shell attached message

## 2016-03-05 ENCOUNTER — Ambulatory Visit (HOSPITAL_COMMUNITY): Payer: BLUE CROSS/BLUE SHIELD

## 2016-03-05 ENCOUNTER — Telehealth: Payer: Self-pay

## 2016-03-05 ENCOUNTER — Other Ambulatory Visit: Payer: Self-pay | Admitting: Oncology

## 2016-03-05 ENCOUNTER — Ambulatory Visit (HOSPITAL_COMMUNITY)
Admission: RE | Admit: 2016-03-05 | Discharge: 2016-03-05 | Disposition: A | Discharge status: Home / Self Care | Payer: BLUE CROSS/BLUE SHIELD | Source: Ambulatory Visit | Attending: Oncology | Admitting: Oncology

## 2016-03-05 DIAGNOSIS — J90 Pleural effusion, not elsewhere classified: Secondary | ICD-10-CM | POA: Insufficient documentation

## 2016-03-05 DIAGNOSIS — R180 Malignant ascites: Secondary | ICD-10-CM | POA: Diagnosis present

## 2016-03-05 NOTE — Progress Notes (Signed)
Patient ID: Brittany Schwartz, female   DOB: 08-28-1951, 65 y.o.   MRN: [id number] Pt presented to [place] dept today for paracentesis. On limited US abd in all four quadrants there is no significant ascites present. Incidental small rt pleural effusion noted. Procedure was cancelled. Pt/family/Dr. Mariana Kaufman office notified.

## 2016-03-05 NOTE — Telephone Encounter (Signed)
Lennette Bihari PA called from radiology to let us know that there is no free fluid in pt's abdomen, it is all tumor. She has a small R effusion, no ascites.

## 2016-03-11 ENCOUNTER — Telehealth: Payer: Self-pay

## 2016-03-11 ENCOUNTER — Encounter: Payer: Self-pay | Admitting: Oncology

## 2016-03-11 NOTE — Progress Notes (Signed)
Medical Oncology  Notified by Hospice that patient died at home on March 26, 2016 at 0709. Sympathy letter written to family. Cc this note to other MDs, as their help with this very nice patient was much appreciated.  Godfrey Pick, MD

## 2016-03-11 NOTE — Telephone Encounter (Signed)
Angela Nevin called to notify Dr. Marko Plume that Ms. Dicecco passed away this morning 03/31/2016 at 0709.

## 2016-03-12 ENCOUNTER — Other Ambulatory Visit: Payer: Self-pay | Admitting: Oncology

## 2016-03-25 ENCOUNTER — Ambulatory Visit: Payer: BLUE CROSS/BLUE SHIELD | Admitting: Oncology

## 2016-04-01 DEATH — deceased

## 2016-05-20 ENCOUNTER — Ambulatory Visit: Payer: 59 | Admitting: Gynecologic Oncology

## 2017-03-06 IMAGING — CT CT ABD-PELV W/ CM
2 of 6 series · 16 of 46 positions shown, 18 images · IV contrast (OMNIPAQUE 300)
Comparison: CT abdomen and pelvis 07/07/2015

CLINICAL DATA: Bilateral upper abdominal pain for a week. History
of endometrial cancer. The patient has completed chemotherapy and
radiation therapy. Initial encounter.

EXAM:
CT ABDOMEN AND PELVIS WITH CONTRAST
TECHNIQUE: Multidetector CT imaging of the abdomen and pelvis was performed
using the standard protocol following bolus administration of
intravenous contrast.
CONTRAST:  80 mL OMNIPAQUE IOHEXOL 300 MG/ML  SOLN

[Series 2: abd/pel with · axial · 0.78mm/px · z∈[+1074,+1498]mm · 13 of 97 slices shown, 15 images]
[im 6/97  soft-tissue]
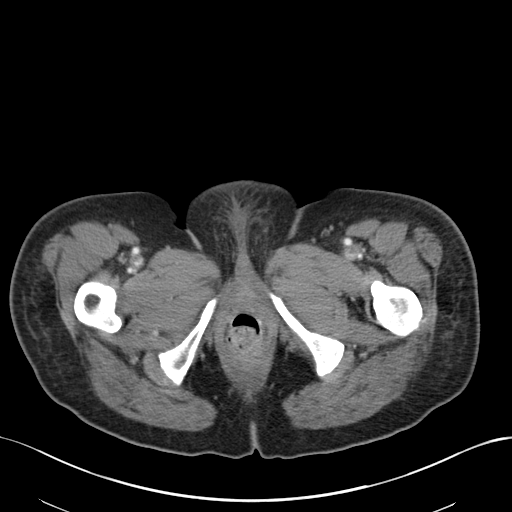
[im 6/97  bone]
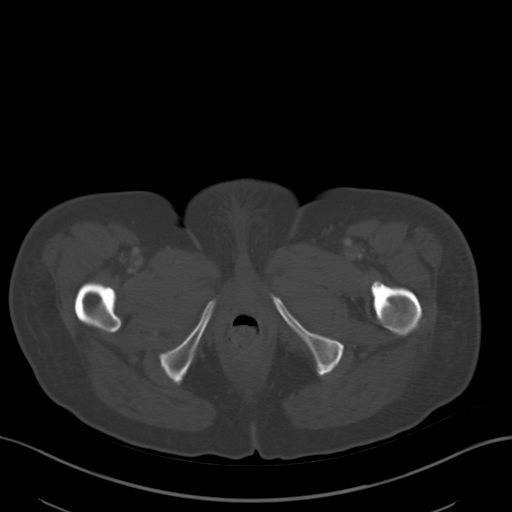
[im 16/97  soft-tissue]
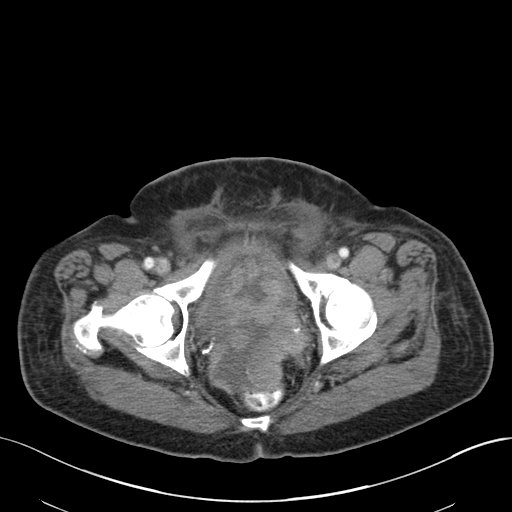
[im 21/97  soft-tissue]
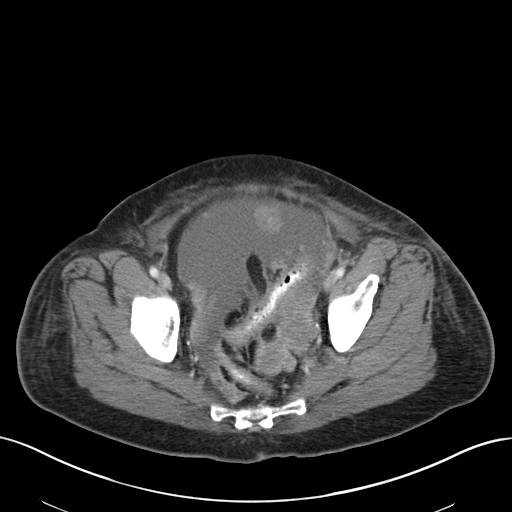
[im 26/97  soft-tissue]
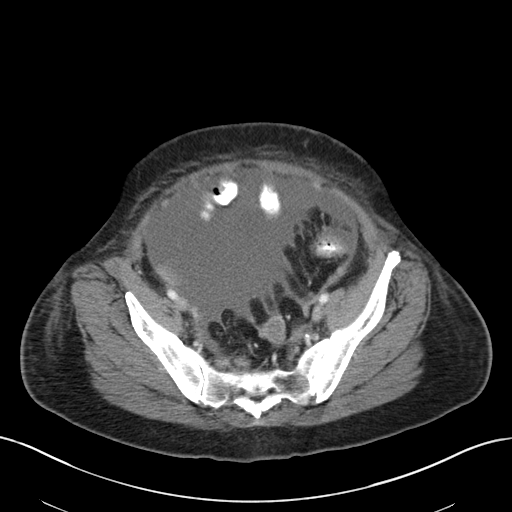
[im 36/97  soft-tissue]
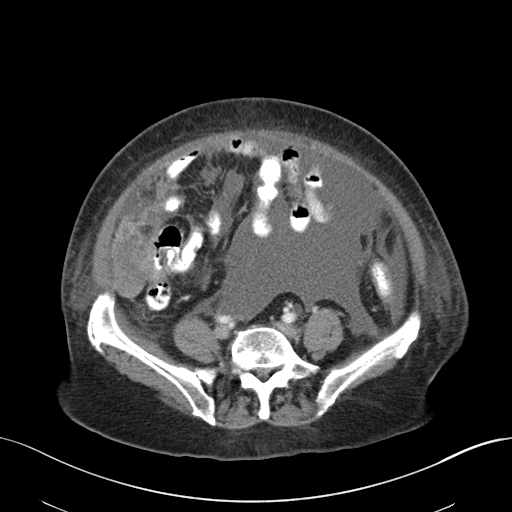
[im 41/97  soft-tissue]
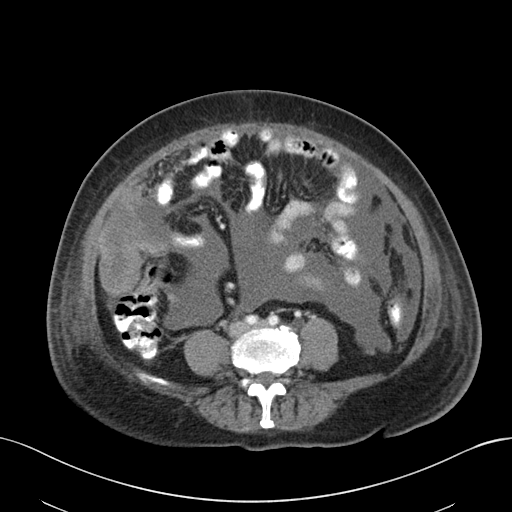
[im 51/97  soft-tissue]
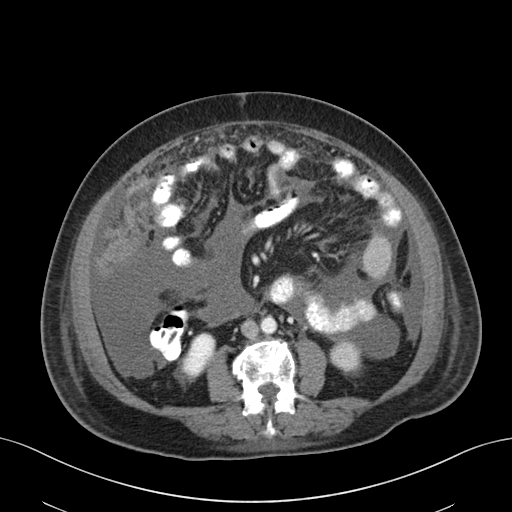
[im 56/97  soft-tissue]
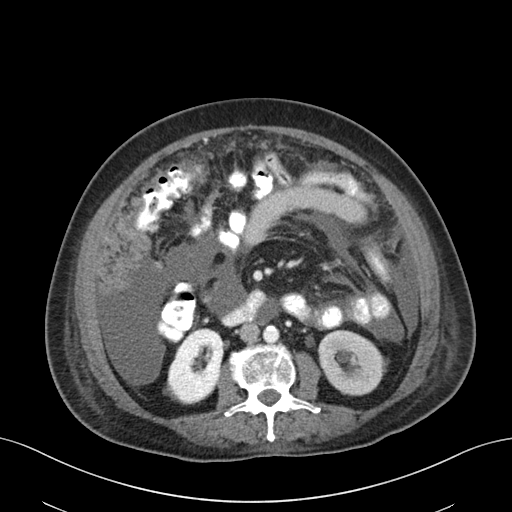
[im 61/97  soft-tissue]
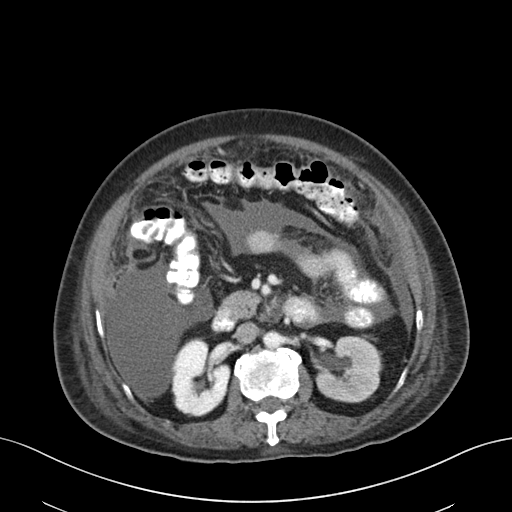
[im 61/97  bone]
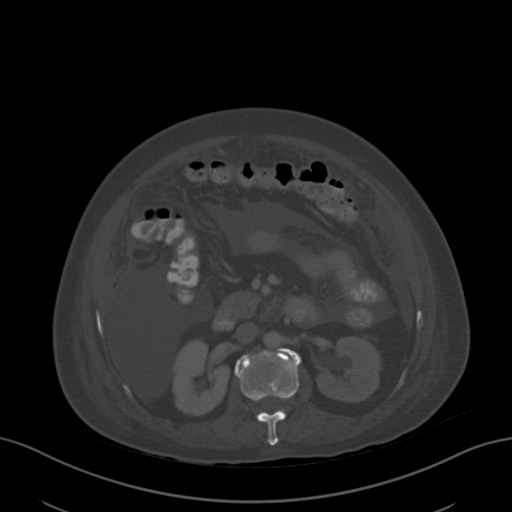
[im 71/97  soft-tissue]
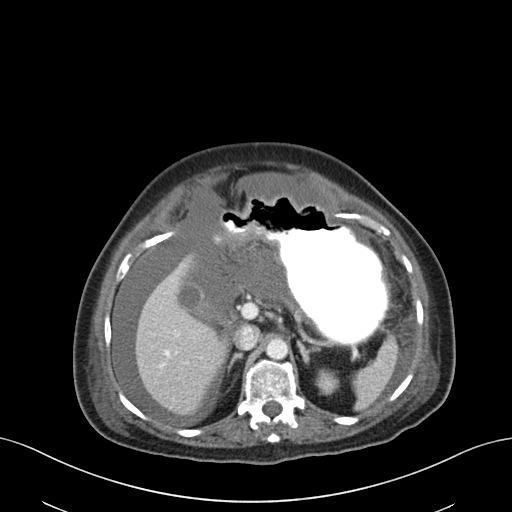
[im 76/97  soft-tissue]
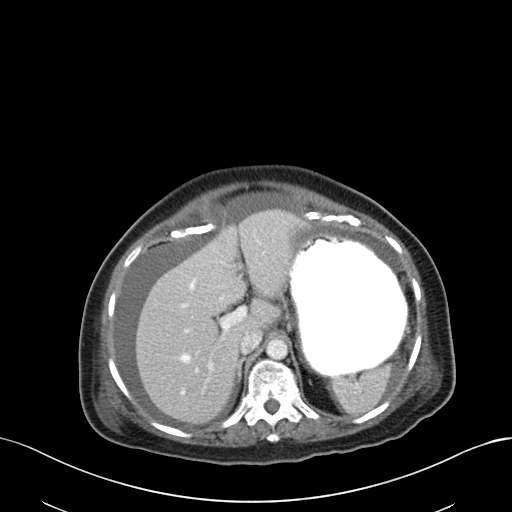
[im 81/97  soft-tissue]
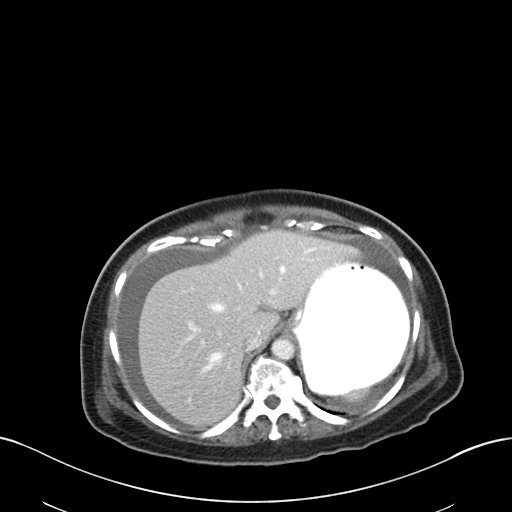
[im 91/97  soft-tissue]
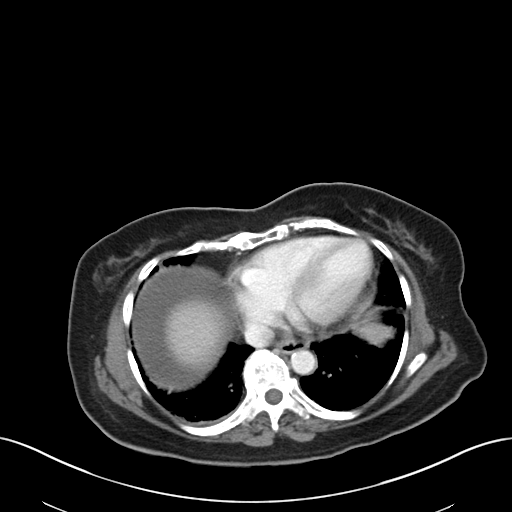

[Series 5: coronal a/|p · coronal · 0.96mm/px · 3 of 86 slices shown]
[im 29/86  soft-tissue]
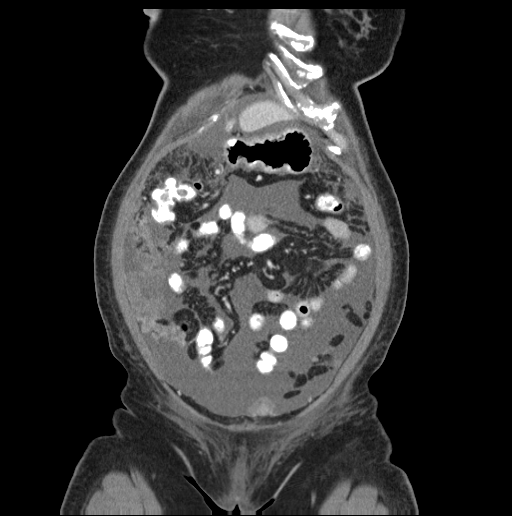
[im 38/86  soft-tissue]
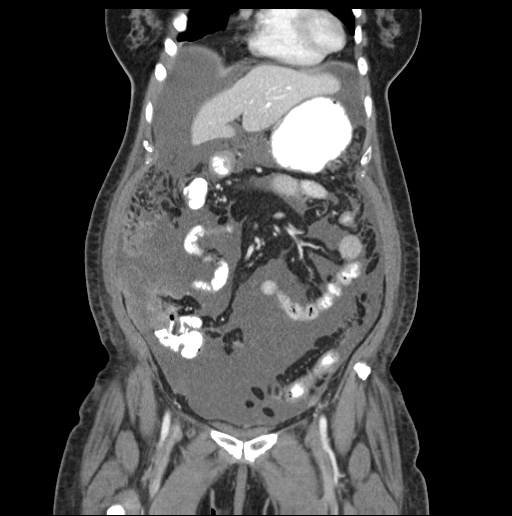
[im 48/86  soft-tissue]
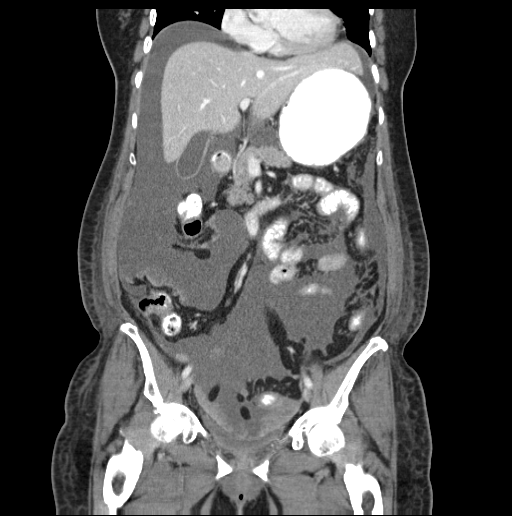

[16 of 46 positions shown; findings below may reference images not displayed]

FINDINGS: Mild dependent atelectasis is seen in the lung bases. Heart size is
mildly enlarged. There is a trace right pleural effusion. No
pericardial effusion or left pleural effusion.

The patient has a new moderate to large volume of abdominal and
pelvic ascites. Extensive omental caking is new since the prior
examination and appears worst in the right mid abdomen. More
masslike omental lesion is seen at the level of the right iliac wing
and measures 6.1 cm AP by 3.3 cm transverse on image 62. Pelvic mass
lesions are also identified. In the left hemipelvis, a lesion
measuring approximately 6.3 cm AP by 2.9 cm transverse is seen on
image 78.

The liver, gallbladder, spleen, adrenal glands, pancreas and right
kidney appear normal. There is moderate left hydronephrosis with
delayed excretion of contrast from the left kidney. The left ureter
is obstructed in the pelvis by the patient's left pelvic metastases.
The patient is status post hysterectomy.

No focal bony abnormality is identified.
IMPRESSION: New extensive peritoneal carcinomatosis consistent with metastatic
endometrial carcinoma with associated moderate to large volume of
ascites, extensive omental caking and masses. Mass in the left
pelvis results and obstruction of the left ureter and moderate
hydronephrosis.

These results were called by telephone at the time of interpretation
on 12/29/2015 at [DATE] to Dr. SAYED JALIL DE LA HAYE , who verbally
acknowledged these results.

## 2017-03-10 IMAGING — US US PARACENTESIS
1 series · 6 of 6 positions shown · non-contrast
Comparison: none

INDICATION: malignant ascites. Request is made for therapeutic paracentesis.

[Series 1: us paracentesis · 0.26mm/px · 6 of 6 slices shown]
[im 1/6]
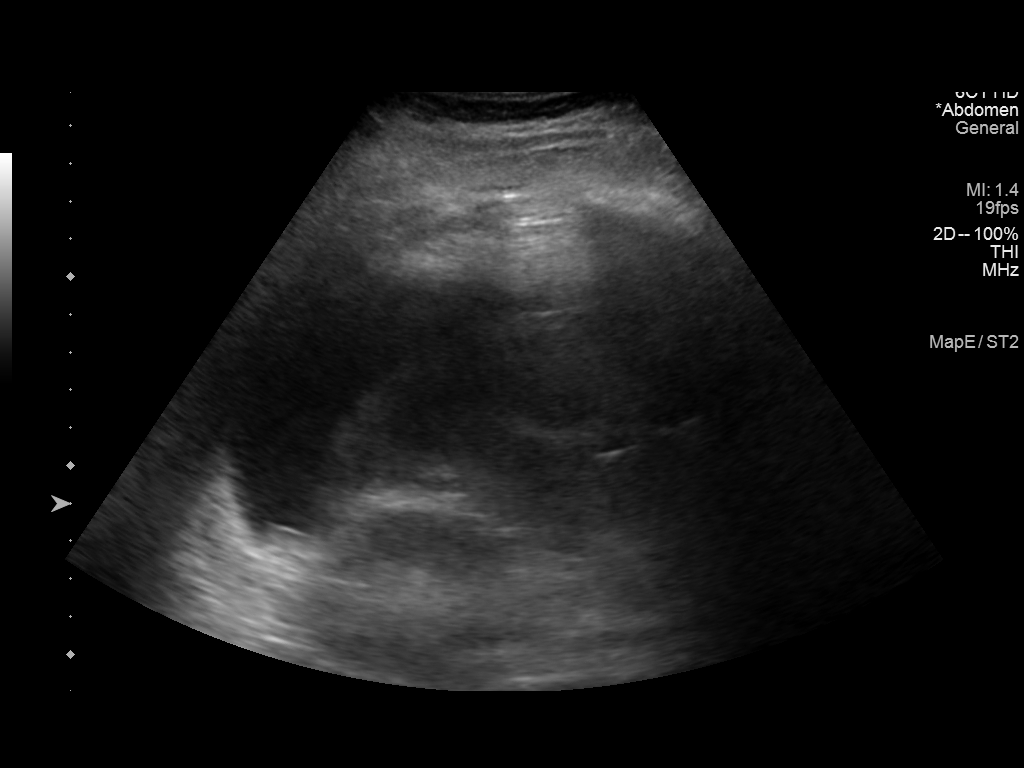
[im 2/6]
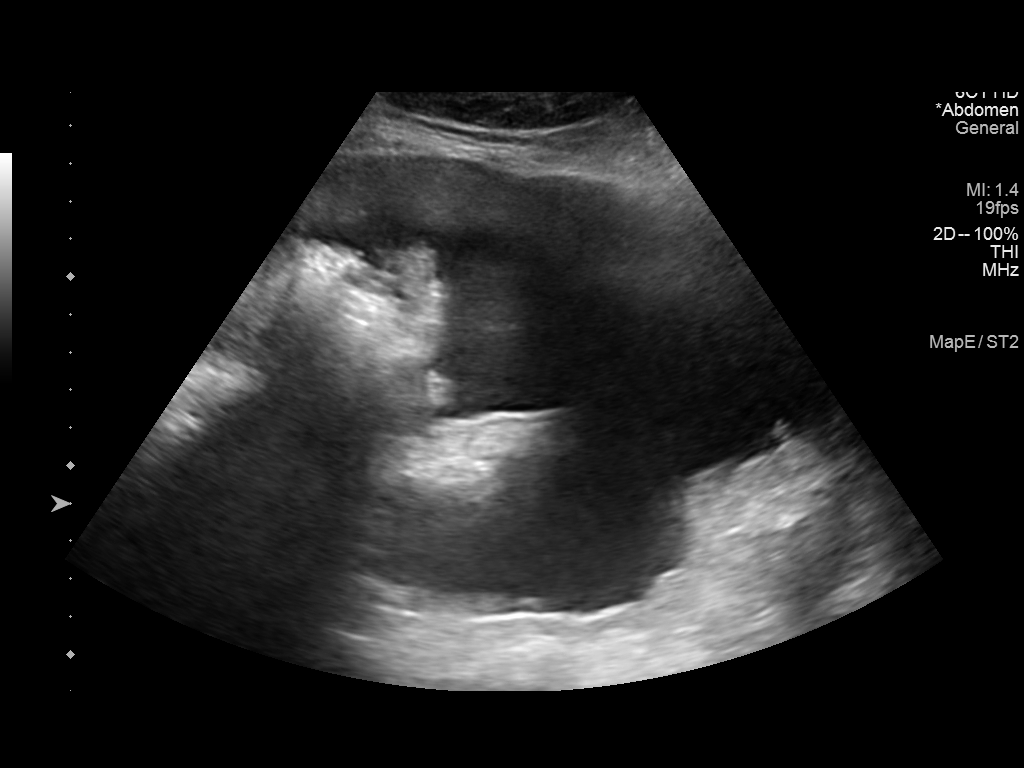
[im 3/6]
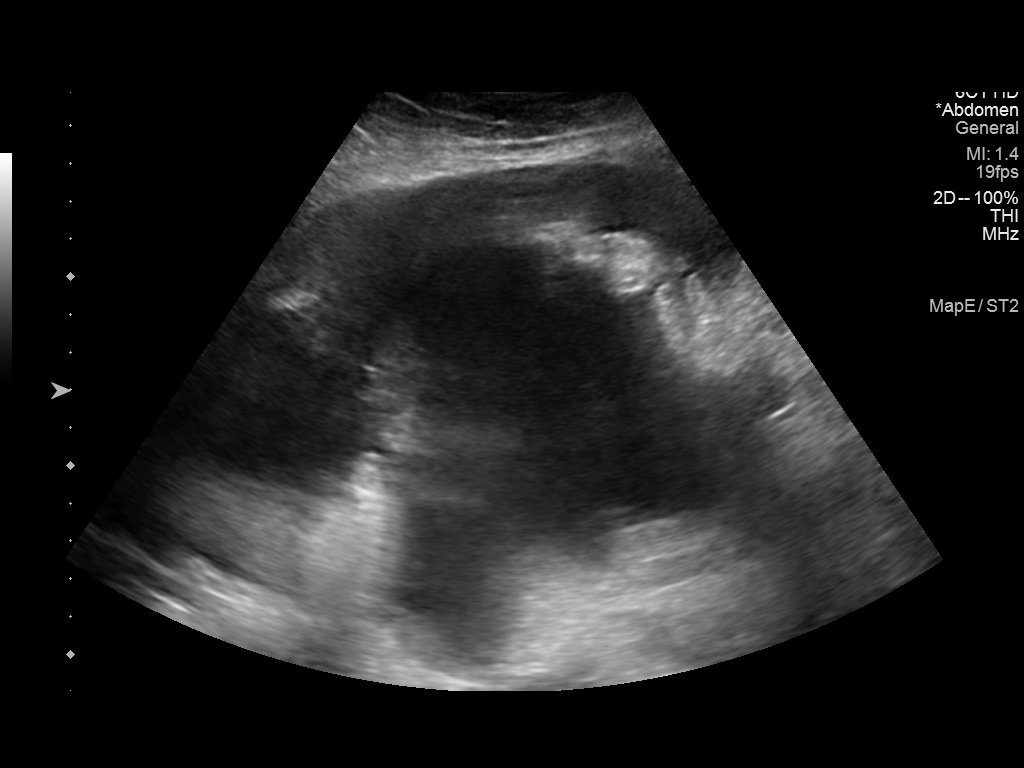
[im 4/6]
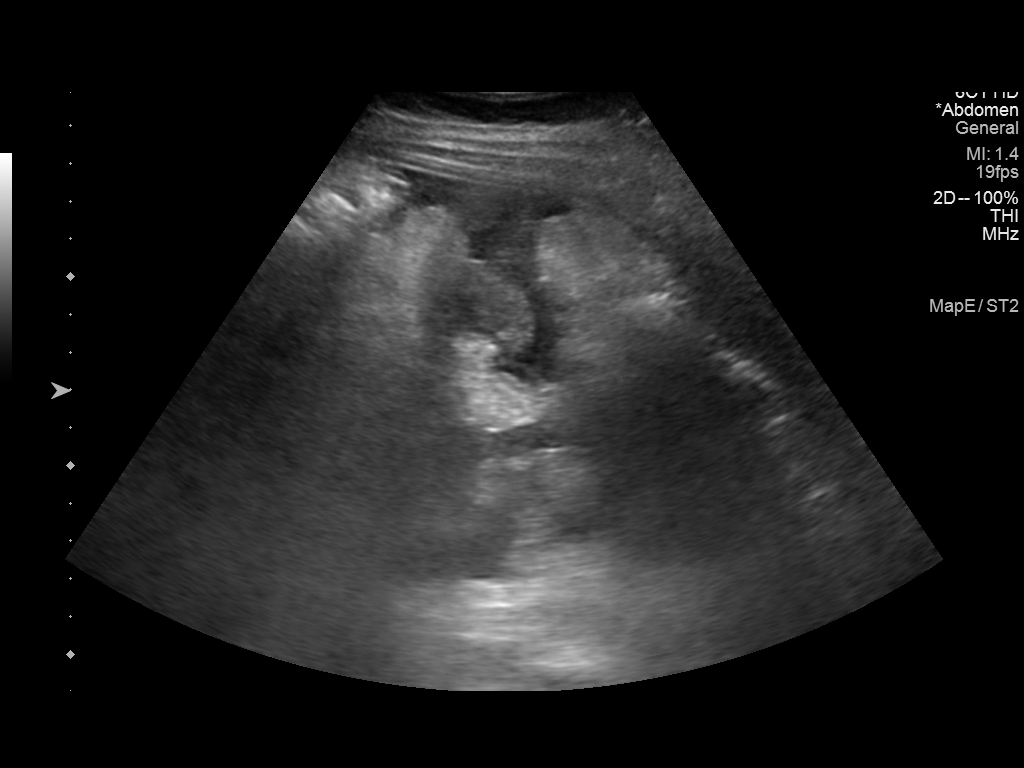
[im 5/6]
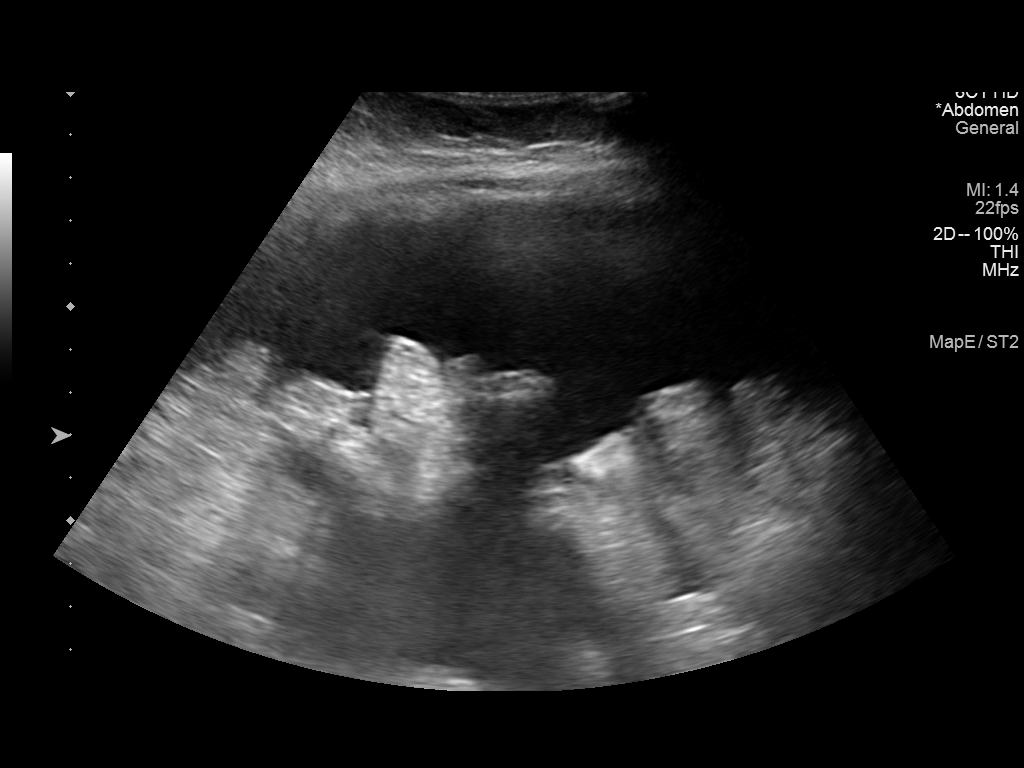
[im 6/6]
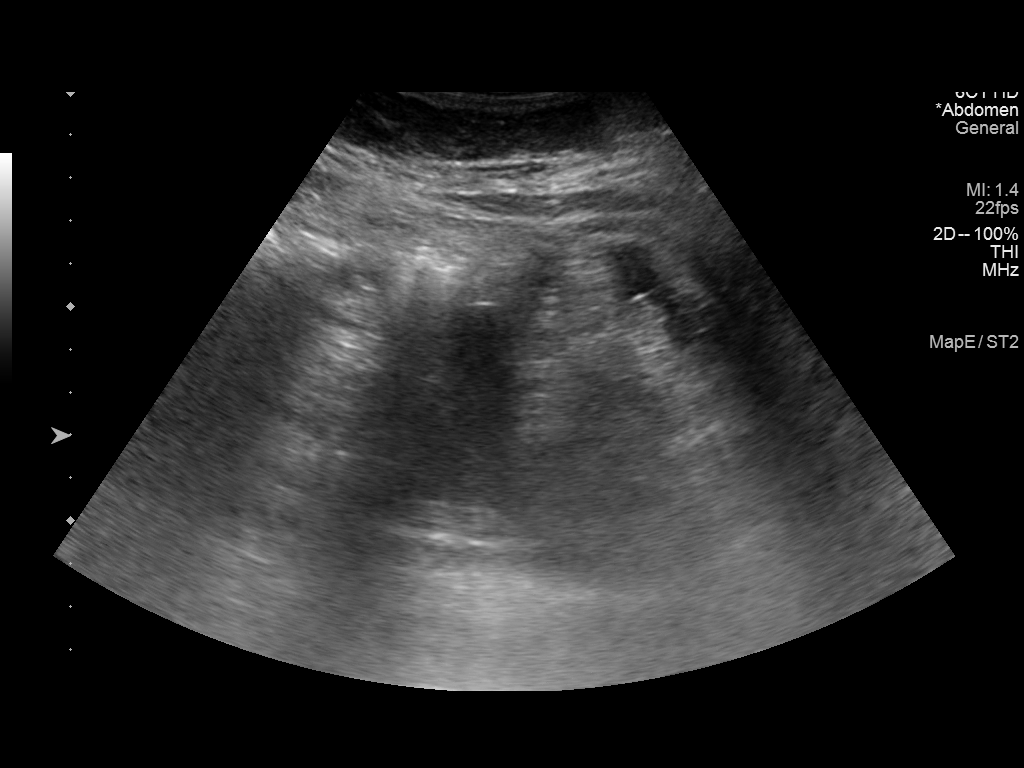

[6 of 6 positions shown; findings below may reference images not displayed]

EXAM:
ULTRASOUND GUIDED THERAPEUTIC PARACENTESIS

MEDICATIONS:
None.

COMPLICATIONS:
None immediate.

PROCEDURE:
Informed written consent was obtained from the patient after a
discussion of the risks, benefits and alternatives to treatment. A
timeout was performed prior to the initiation of the procedure.

Initial ultrasound scanning demonstrates a moderate amount of
ascites within the left mid to lower abdominal quadrant. The left
mid to lower abdomen was prepped and draped in the usual sterile
fashion. 1% lidocaine was used for local anesthesia.

Following this, a Yueh catheter was introduced. An ultrasound image
was saved for documentation purposes. The paracentesis was
performed. The catheter was removed and a dressing was applied. The
patient tolerated the procedure well without immediate post
procedural complication.
FINDINGS: A total of approximately 3.5 liters of bloody fluid was removed.
IMPRESSION: Successful ultrasound-guided therapeutic paracentesis yielding
liters of peritoneal fluid.

## 2017-03-10 IMAGING — US IR FLUORO GUIDE CV LINE*R*
1 series · 1 of 1 positions shown · non-contrast
Comparison: none

CLINICAL DATA: Endometrial carcinoma, needs long-term venous access
for chemotherapy regimen.
TECHNIQUE: The procedure, risksbenefits, and alternatives were explained to the
patient. Questions regarding the procedure were encouraged and
answered. The patient understands and consents to the procedure. As
antibiotic prophylaxis, cefazolin 2 g IV was ordered pre-procedure
and administered intravenously within one hour of incision., Patency
of the right IJ vein was confirmed with ultrasound with image
documentation. An appropriate skin site was determined. Skin site
was marked. Region was prepped using maximum barrier technique
including cap and mask, sterile gown, sterile gloves, large sterile
sheet, and Chlorhexidine as cutaneous antisepsis. The region was
infiltrated locally with 1% lidocaine. Under real-time ultrasound
guidance, the right IJ vein was accessed with a 21 gauge
micropuncture needle; the needle tip within the vein was confirmed
with ultrasound image documentation. Needle was exchanged over a 018
guidewire for transitional dilator which allowed passage of the
Benson wire into the IVC. Over this, the transitional dilator was
exchanged for a 5 French MPA catheter. A small incision was made on
the right anterior chest wall and a subcutaneous pocket fashioned.
The power-injectable port was positioned and its catheter tunneled
to the right IJ dermatotomy site. The MPA catheter was exchanged
over an Amplatz wire for a peel-away sheath, through which the port
catheter, which had been trimmed to the appropriate length, was
advanced and positioned under fluoroscopy with its tip at the
cavoatrial junction. Spot chest radiograph confirms good catheter
position and no pneumothorax. The pocket was closed with deep
interrupted and subcuticular continuous 3-0 Monocryl sutures. The
port was flushed per protocol. The incisions were covered with
Dermabond then covered with a sterile dressing.

COMPLICATIONS:
COMPLICATIONS
None immediate

[Series 1: ir fluoro/shunt/fist · 1 of 1 slices shown]
[im 1/1]
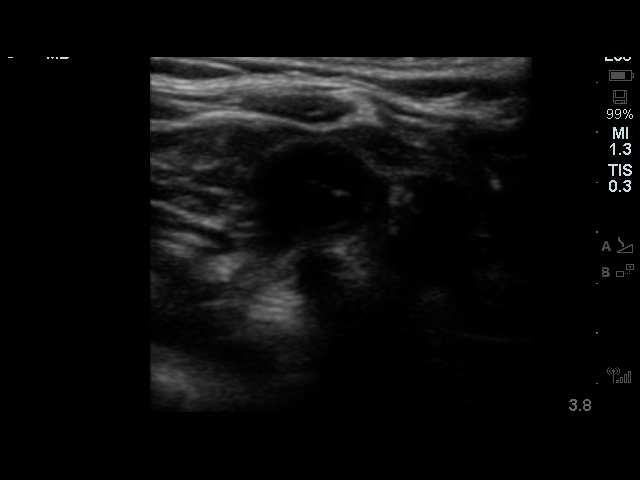

[1 of 1 positions shown; findings below may reference images not displayed]

EXAM:
TUNNELED PORT CATHETER PLACEMENT WITH ULTRASOUND AND FLUOROSCOPIC
GUIDANCE

FLUOROSCOPY TIME:  0.3 minutes, 41 uVym4 DAP

ANESTHESIA/SEDATION:
Intravenous Fentanyl and Versed were administered as conscious
sedation during continuous monitoring of the patient's level of
consciousness and physiological / cardiorespiratory status by the
radiology RN, with a total moderate sedation time of 14 minutes.
IMPRESSION: Technically successful right IJ power-injectable port catheter
placement. Ready for routine use.

## 2017-03-12 IMAGING — US US ABDOMEN LIMITED
1 series · 8 of 8 positions shown · non-contrast
Comparison: Abdominal ultrasound Saturday January, 2016.

CLINICAL DATA: Evaluation for possible paracentesis

EXAM:
LIMITED ABDOMEN ULTRASOUND FOR ASCITES
TECHNIQUE: Limited ultrasound survey for ascites was performed in all four
abdominal quadrants.

[Series 1: us abdomen limited · 0.26mm/px · 8 of 8 slices shown]
[im 1/8]
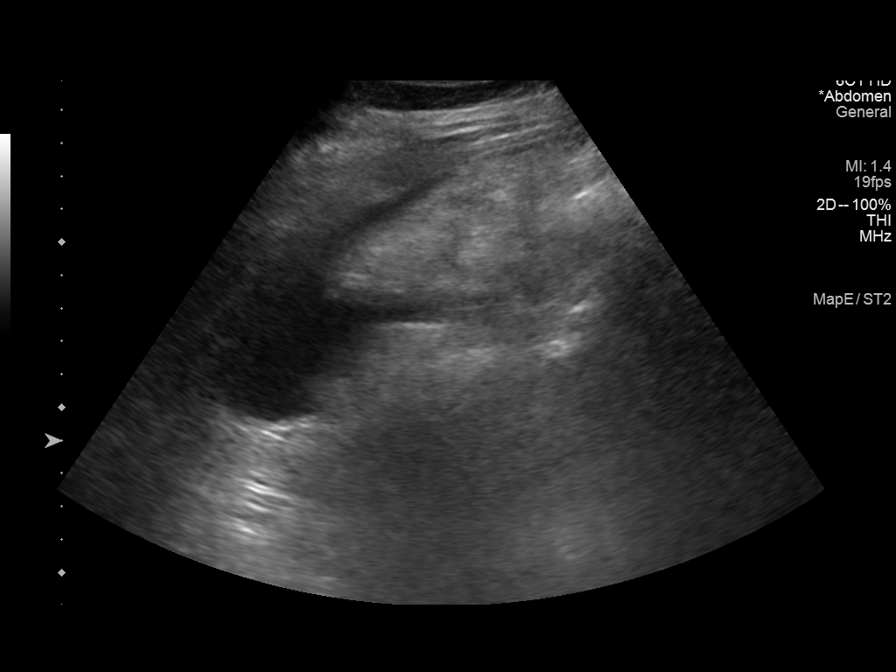
[im 2/8]
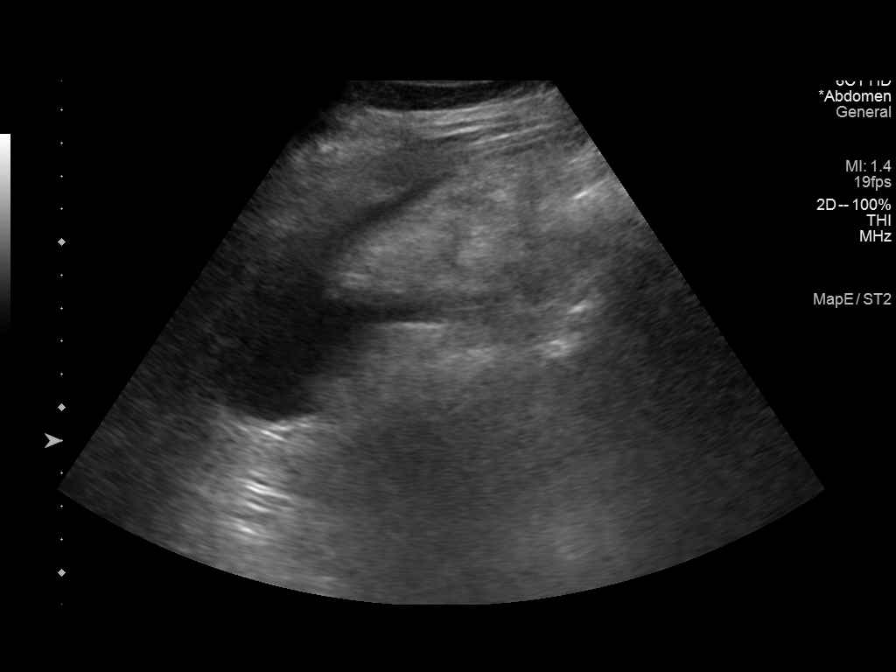
[im 3/8]
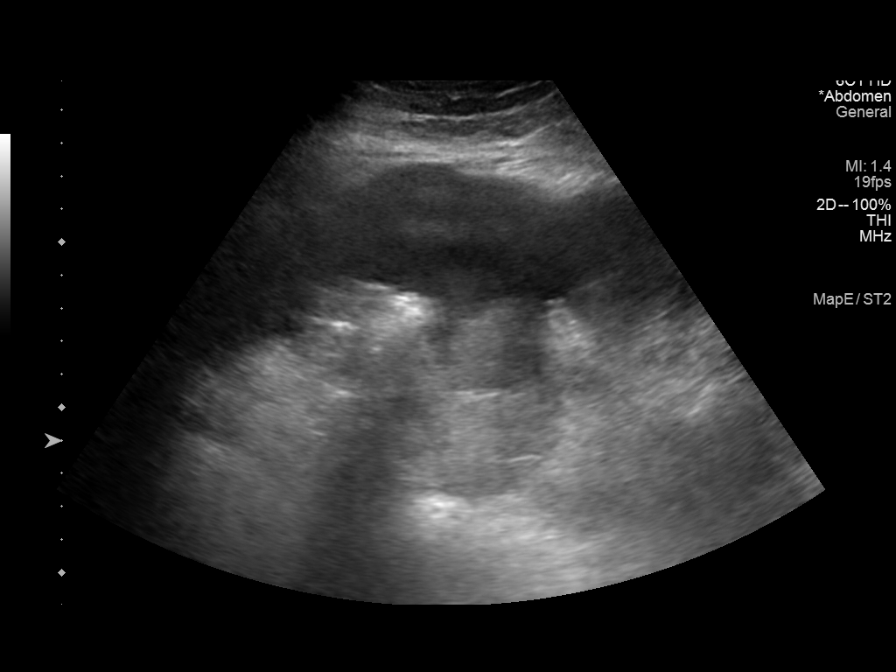
[im 4/8]
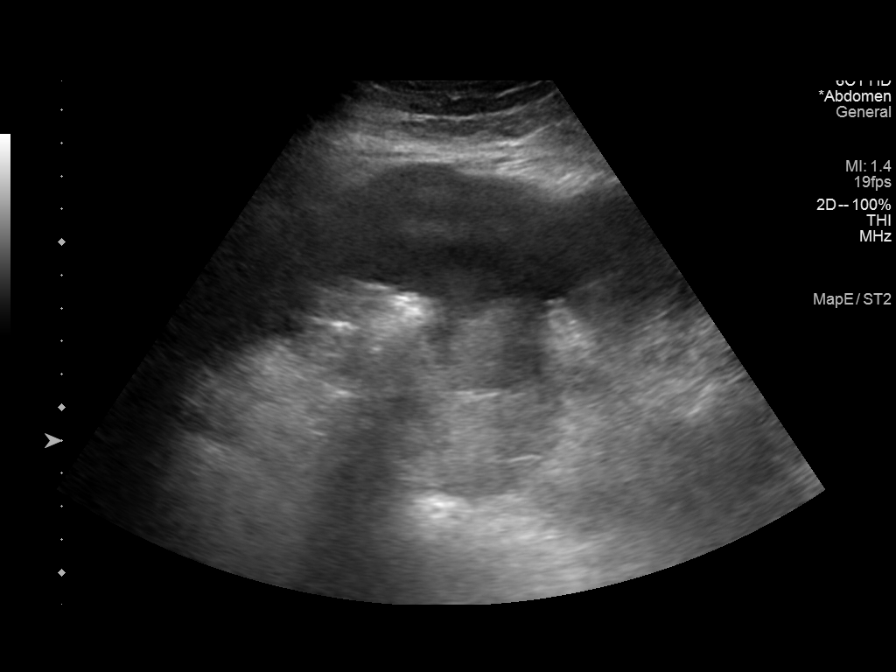
[im 5/8]
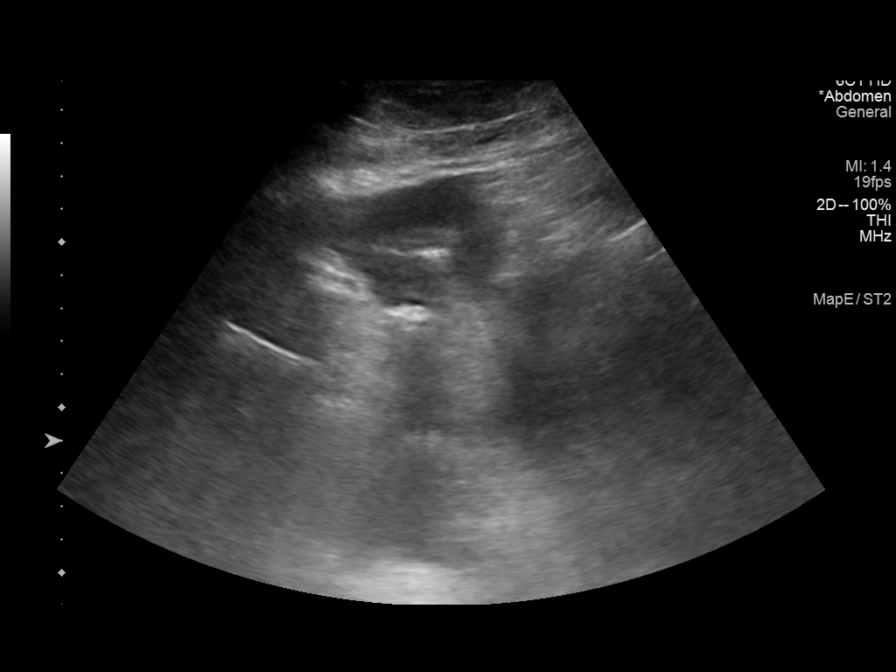
[im 6/8]
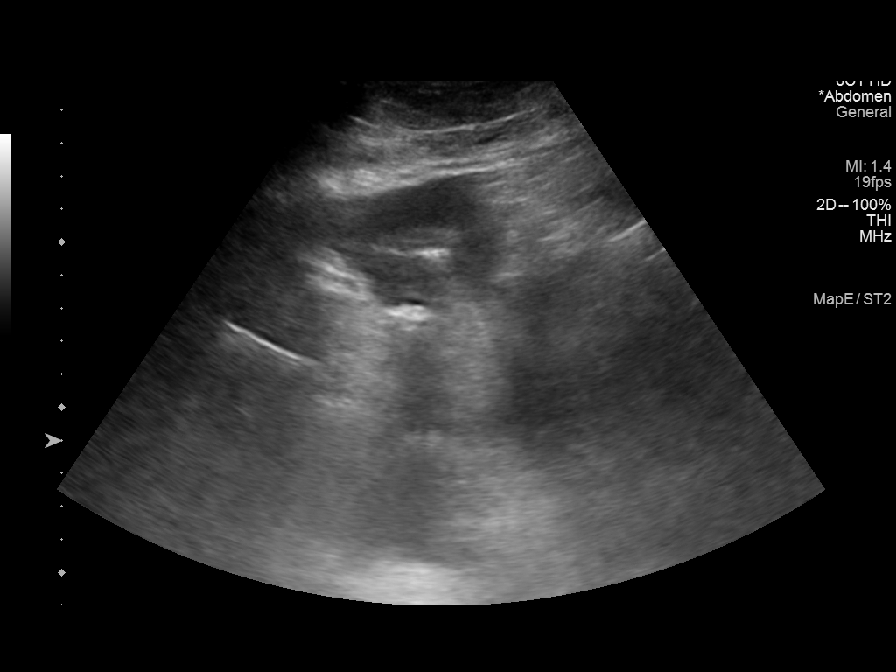
[im 7/8]
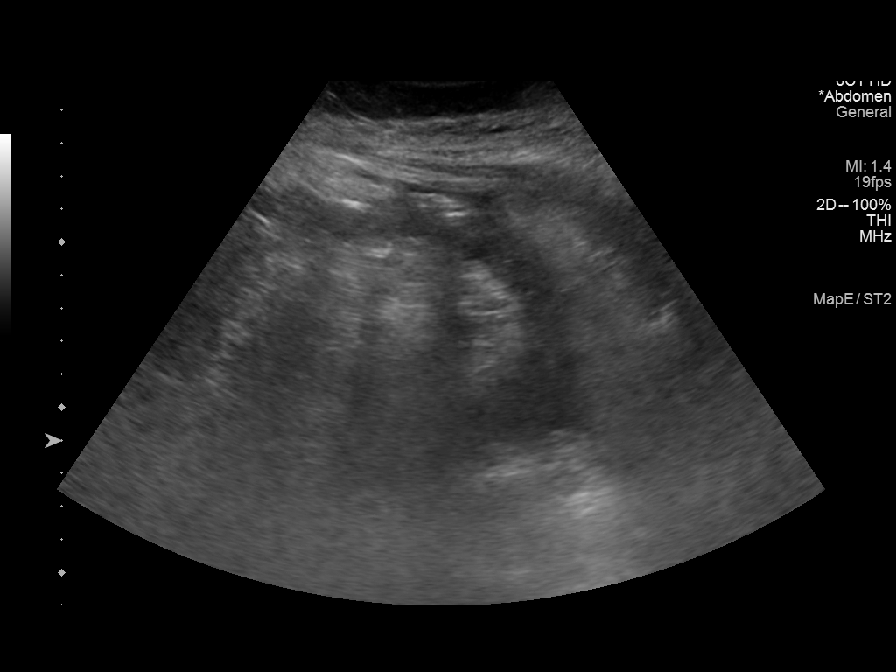
[im 8/8]
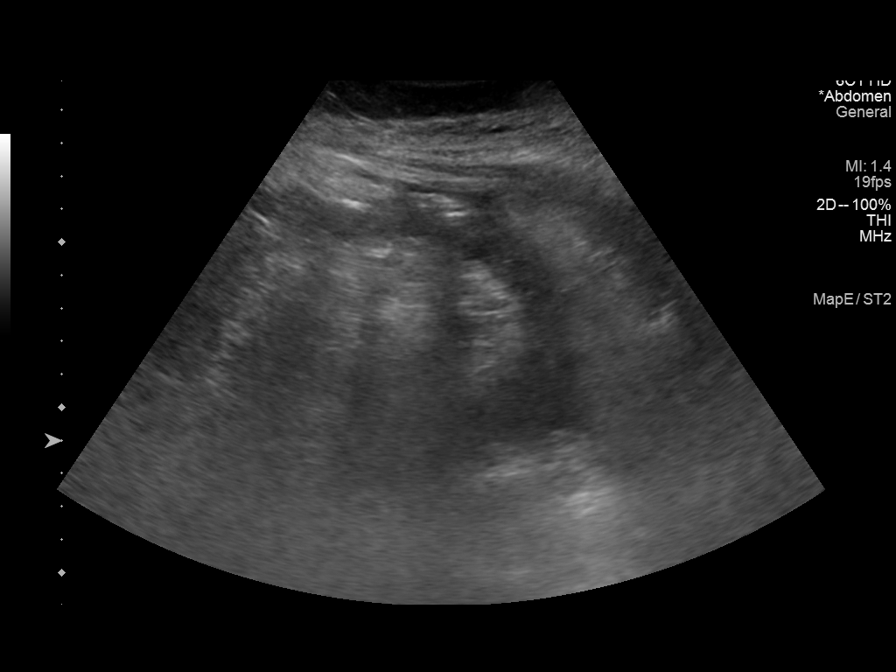

[8 of 8 positions shown; findings below may reference images not displayed]

FINDINGS: There is ascites in the 4 quadrants of the abdomen. The volume is
deemed insufficient for successful paracentesis.
IMPRESSION: There is ascites but insufficient for therapeutic paracentesis.

## 2017-03-13 IMAGING — US US ABDOMEN LIMITED
1 series · 11 of 11 positions shown · non-contrast
Comparison: None.

CLINICAL DATA: Evaluate for ascites.  Uterine cancer.

EXAM:
LIMITED ABDOMINAL ULTRASOUND

[Series 1: us abdomen limited · 0.26mm/px · 11 of 11 slices shown]
[im 1/11]
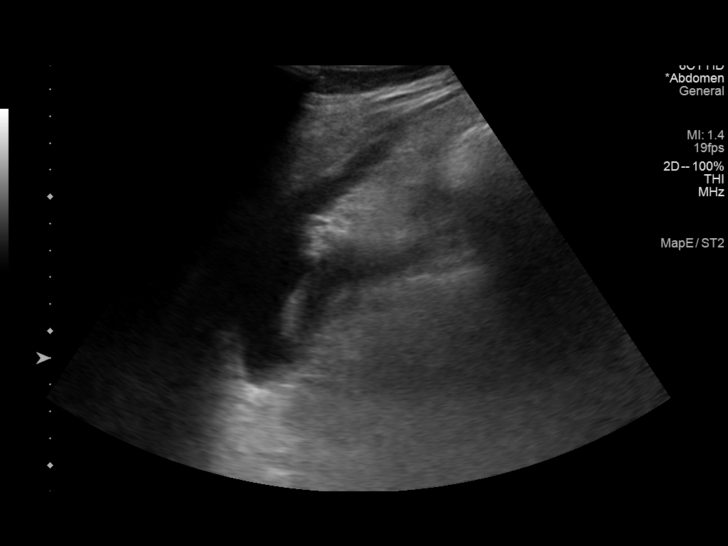
[im 2/11]
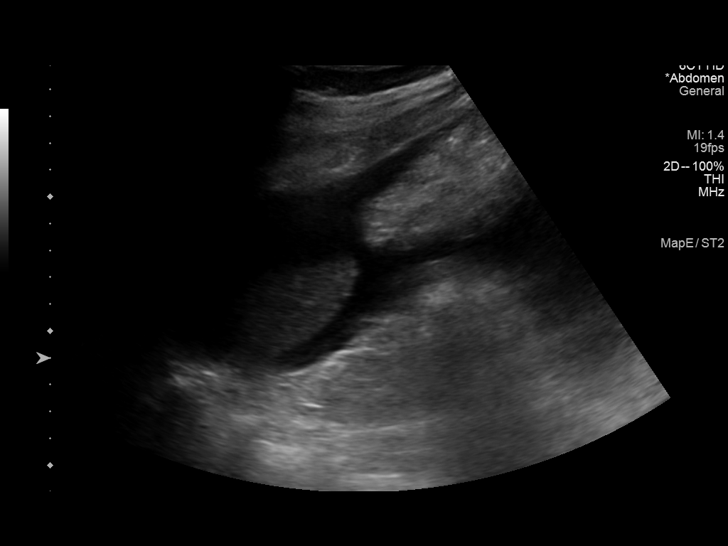
[im 3/11]
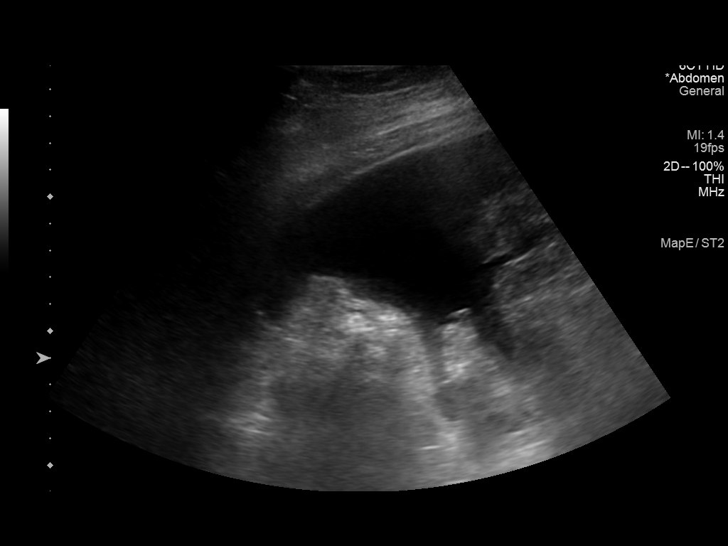
[im 4/11]
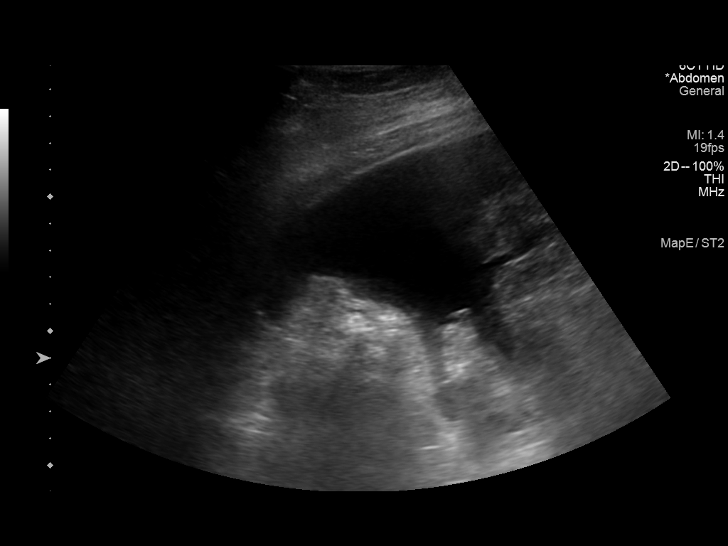
[im 5/11]
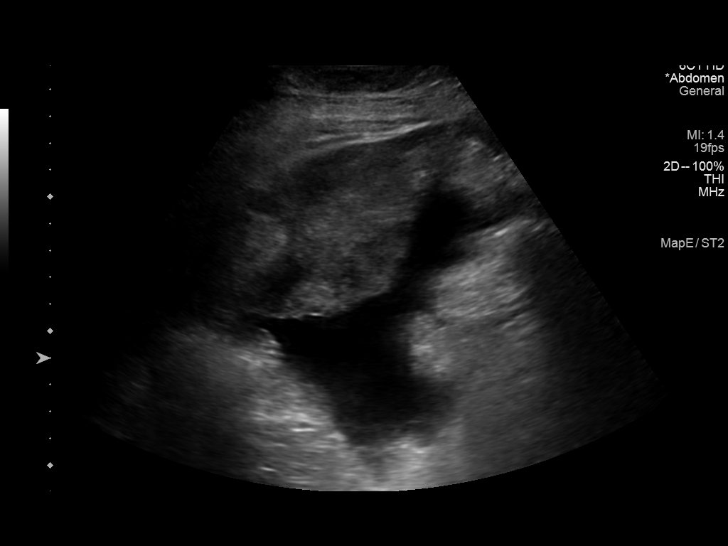
[im 6/11]
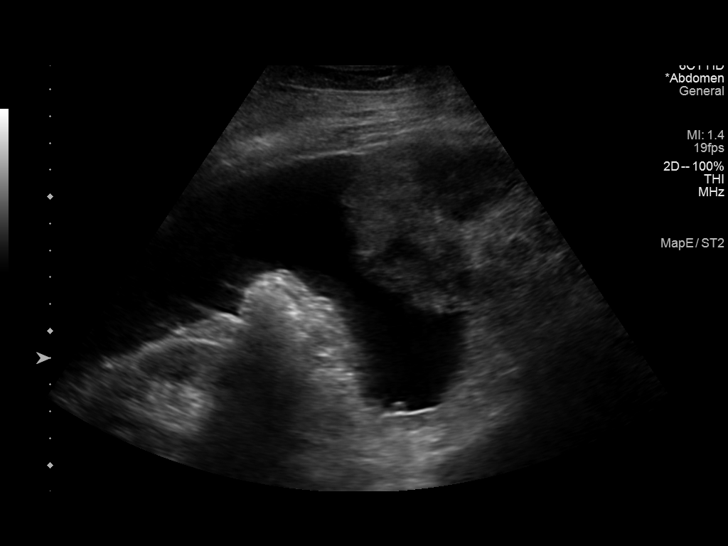
[im 7/11]
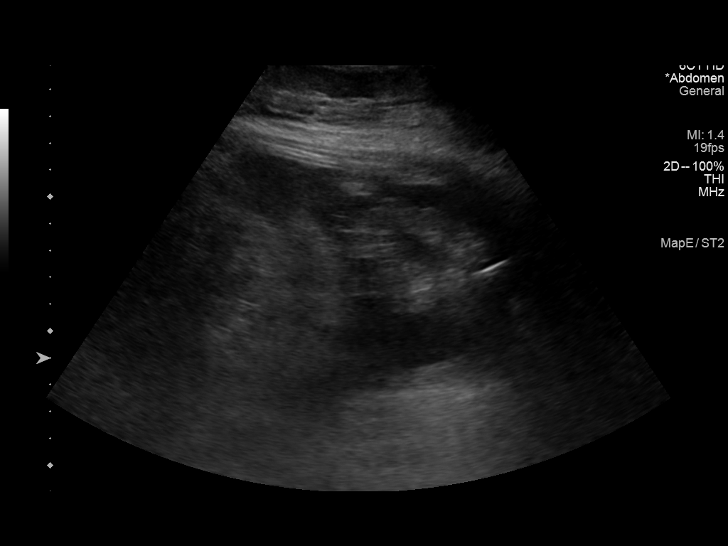
[im 8/11]
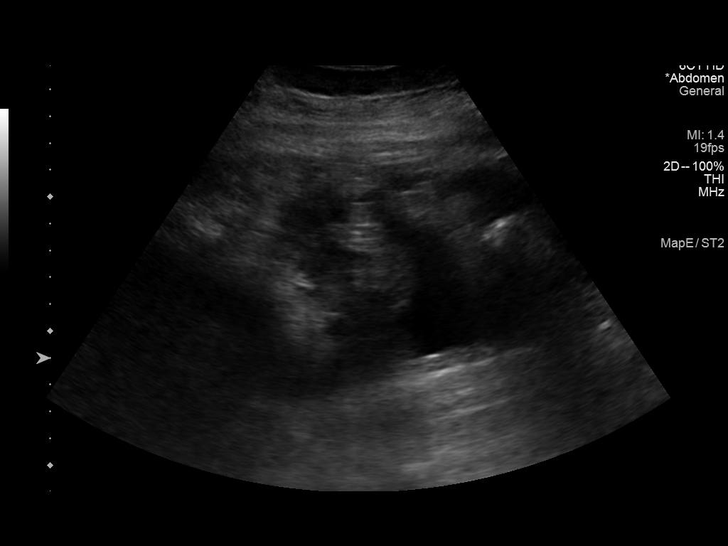
[im 9/11]
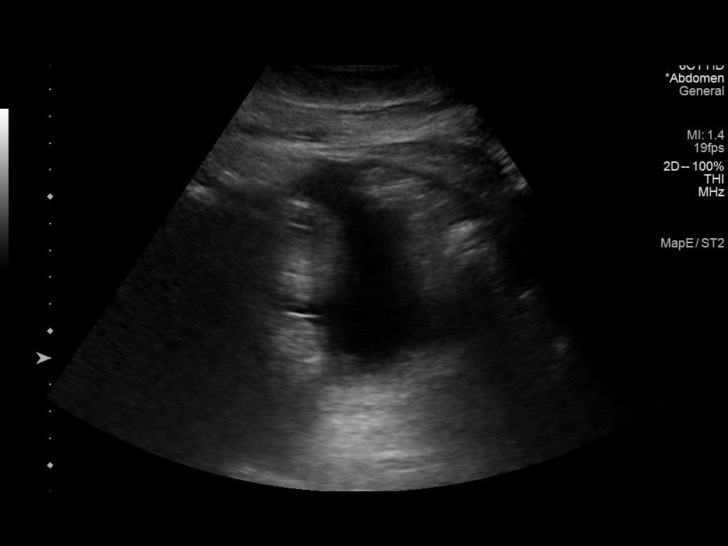
[im 10/11]
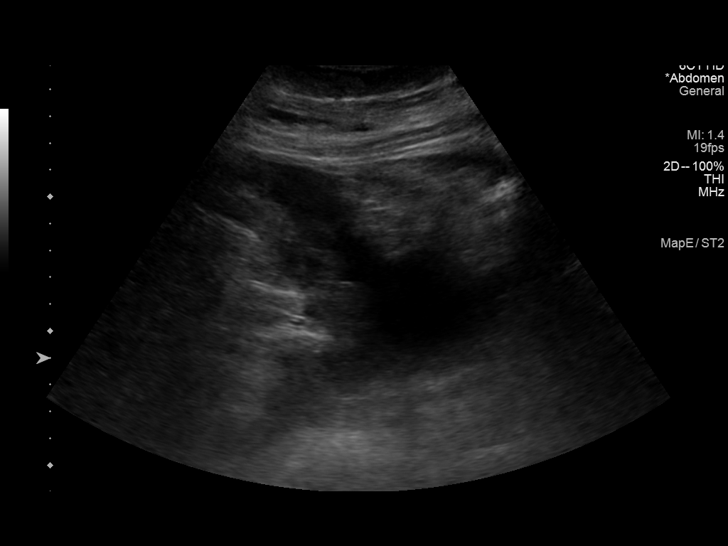
[im 11/11]
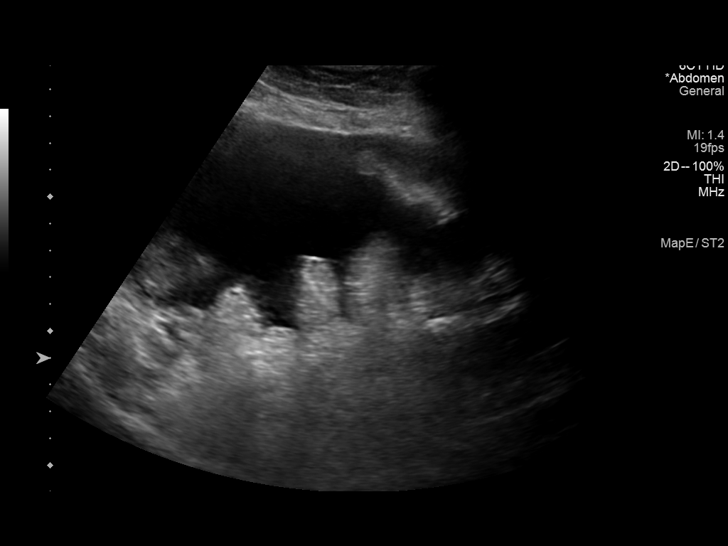

[11 of 11 positions shown; findings below may reference images not displayed]

FINDINGS: Moderate ascites is present in all 4 quadrants of the abdomen as
well as the midline of the pelvis.
IMPRESSION: Moderate ascites is present.
